# Patient Record
Sex: Male | Born: 1950 | Race: White | Hispanic: No | Smoking: Never smoker
Health system: Southern US, Community
[De-identification: ages and names within clinical notes are randomized; demographics above are authoritative.]

## PROBLEM LIST (undated history)

## (undated) DIAGNOSIS — K219 Gastro-esophageal reflux disease without esophagitis: Secondary | ICD-10-CM

## (undated) DIAGNOSIS — G4730 Sleep apnea, unspecified: Secondary | ICD-10-CM

## (undated) DIAGNOSIS — I509 Heart failure, unspecified: Secondary | ICD-10-CM

## (undated) DIAGNOSIS — T8859XA Other complications of anesthesia, initial encounter: Secondary | ICD-10-CM

## (undated) DIAGNOSIS — C801 Malignant (primary) neoplasm, unspecified: Secondary | ICD-10-CM

## (undated) DIAGNOSIS — Z950 Presence of cardiac pacemaker: Secondary | ICD-10-CM

## (undated) DIAGNOSIS — T4145XA Adverse effect of unspecified anesthetic, initial encounter: Secondary | ICD-10-CM

## (undated) DIAGNOSIS — E039 Hypothyroidism, unspecified: Secondary | ICD-10-CM

## (undated) DIAGNOSIS — I442 Atrioventricular block, complete: Secondary | ICD-10-CM

## (undated) DIAGNOSIS — I350 Nonrheumatic aortic (valve) stenosis: Secondary | ICD-10-CM

## (undated) DIAGNOSIS — I10 Essential (primary) hypertension: Secondary | ICD-10-CM

## (undated) HISTORY — DX: Nonrheumatic aortic (valve) stenosis: I35.0

## (undated) HISTORY — DX: Atrioventricular block, complete: I44.2

## (undated) HISTORY — PX: CARDIAC CATHETERIZATION: SHX172

## (undated) HISTORY — PX: OTHER SURGICAL HISTORY: SHX169

---

## 2000-05-01 ENCOUNTER — Encounter: Admission: RE | Admit: 2000-05-01 | Discharge: 2000-05-01 | Payer: Self-pay | Admitting: Family Medicine

## 2000-05-01 ENCOUNTER — Encounter: Payer: Self-pay | Admitting: Family Medicine

## 2004-06-05 ENCOUNTER — Encounter: Admission: RE | Admit: 2004-06-05 | Discharge: 2004-06-05 | Payer: Self-pay | Admitting: Geriatric Medicine

## 2005-01-05 ENCOUNTER — Encounter: Admission: RE | Admit: 2005-01-05 | Discharge: 2005-01-05 | Payer: Self-pay | Admitting: Orthopedic Surgery

## 2005-01-22 ENCOUNTER — Encounter: Admission: RE | Admit: 2005-01-22 | Discharge: 2005-01-22 | Payer: Self-pay | Admitting: Orthopedic Surgery

## 2005-05-11 ENCOUNTER — Encounter: Admission: RE | Admit: 2005-05-11 | Discharge: 2005-05-11 | Payer: Self-pay | Admitting: Orthopedic Surgery

## 2010-05-21 ENCOUNTER — Encounter: Payer: Self-pay | Admitting: Orthopedic Surgery

## 2010-12-15 ENCOUNTER — Emergency Department (HOSPITAL_COMMUNITY): Payer: Self-pay

## 2010-12-15 ENCOUNTER — Emergency Department (HOSPITAL_COMMUNITY)
Admission: EM | Admit: 2010-12-15 | Discharge: 2010-12-15 | Disposition: A | Discharge status: Home / Self Care | Payer: Self-pay | Attending: Emergency Medicine | Admitting: Emergency Medicine

## 2010-12-15 DIAGNOSIS — Z79899 Other long term (current) drug therapy: Secondary | ICD-10-CM | POA: Insufficient documentation

## 2010-12-15 DIAGNOSIS — R262 Difficulty in walking, not elsewhere classified: Secondary | ICD-10-CM | POA: Insufficient documentation

## 2010-12-15 DIAGNOSIS — H539 Unspecified visual disturbance: Secondary | ICD-10-CM | POA: Insufficient documentation

## 2010-12-15 DIAGNOSIS — W108XXA Fall (on) (from) other stairs and steps, initial encounter: Secondary | ICD-10-CM | POA: Insufficient documentation

## 2010-12-15 DIAGNOSIS — R404 Transient alteration of awareness: Secondary | ICD-10-CM | POA: Insufficient documentation

## 2010-12-15 DIAGNOSIS — E789 Disorder of lipoprotein metabolism, unspecified: Secondary | ICD-10-CM | POA: Insufficient documentation

## 2010-12-15 DIAGNOSIS — I10 Essential (primary) hypertension: Secondary | ICD-10-CM | POA: Insufficient documentation

## 2010-12-15 DIAGNOSIS — S0990XA Unspecified injury of head, initial encounter: Secondary | ICD-10-CM | POA: Insufficient documentation

## 2010-12-15 DIAGNOSIS — E119 Type 2 diabetes mellitus without complications: Secondary | ICD-10-CM | POA: Insufficient documentation

## 2010-12-15 DIAGNOSIS — IMO0002 Reserved for concepts with insufficient information to code with codable children: Secondary | ICD-10-CM | POA: Insufficient documentation

## 2010-12-15 DIAGNOSIS — R112 Nausea with vomiting, unspecified: Secondary | ICD-10-CM | POA: Insufficient documentation

## 2010-12-15 DIAGNOSIS — M25519 Pain in unspecified shoulder: Secondary | ICD-10-CM | POA: Insufficient documentation

## 2010-12-15 DIAGNOSIS — F29 Unspecified psychosis not due to a substance or known physiological condition: Secondary | ICD-10-CM | POA: Insufficient documentation

## 2010-12-15 DIAGNOSIS — E039 Hypothyroidism, unspecified: Secondary | ICD-10-CM | POA: Insufficient documentation

## 2010-12-15 DIAGNOSIS — Z7982 Long term (current) use of aspirin: Secondary | ICD-10-CM | POA: Insufficient documentation

## 2010-12-15 DIAGNOSIS — R51 Headache: Secondary | ICD-10-CM | POA: Insufficient documentation

## 2011-07-13 ENCOUNTER — Encounter (HOSPITAL_COMMUNITY): Payer: Self-pay | Admitting: Pharmacy Technician

## 2011-07-16 DIAGNOSIS — M5126 Other intervertebral disc displacement, lumbar region: Secondary | ICD-10-CM | POA: Diagnosis present

## 2011-07-16 DIAGNOSIS — M48061 Spinal stenosis, lumbar region without neurogenic claudication: Secondary | ICD-10-CM | POA: Diagnosis present

## 2011-07-16 NOTE — H&P (Signed)
Tony [name] 07/16/2011 11:32 AM Location: [facility] Patient #: [id number] WC DOB: 1951/02/08 Married / Language: [place] / Race: White Male   History of Present Illness(Sharon Gillian Shields; 07/16/2011 12:02 PM) The patient is a 61 year old male who comes in today for a preoperative History and Physical. The patient is scheduled for a L3-S1 left sided decompression/foramenotomy to be performed by Dr. Debria Garret D. Shon Baton, MD at [facility] on Thursday, July 19, 2011 at 730am .    Problem List/Past Medical(Deanna Boehlke Dierdre Highman, PA-C; 07/16/2011 11:45 AM) Pain, Lumbar (LBP) (724.2)   Allergies(Netanel Yannuzzi J [facility], PA-C; 07/16/2011 11:45 AM) No Known Drug Allergies. 07/03/2011   Family History(Zahava Quant J Chantel Teti, PA-C; 07/16/2011 11:45 AM) Cerebrovascular Accident. father Depression. mother Diabetes Mellitus. mother and father Cancer. mother Heart Disease. father Heart disease in male family member before age 62 Hypertension. father   Social History(Cuong Moorman J [facility], PA-C; 07/16/2011 11:45 AM) Drug/Alcohol Rehab (Currently). no Drug/Alcohol Rehab (Previously). no Exercise. Exercises weekly; does other Current work status. working full time Alcohol use. former drinker Children. 2 Illicit drug use. no Pain Contract. no Living situation. live with spouse Marital status. married Number of flights of stairs before winded. 4-5 Tobacco use. never smoker   Medication History(Sharon Gillian Shields; 07/16/2011 12:11 PM) Quinapril HCl (1 Oral daily) Specific dose unknown - Active. AmLODIPine Besylate (1 Oral daily) Specific dose unknown - Active. MetFORMIN HCl Active. MetFORMIN HCl (1 Oral daily) Specific dose unknown - Active. GlipiZIDE ER (1 Oral daily) Specific dose unknown - Active. Flexeril (1 Oral prn) Specific dose unknown - Active. Lyrica (1 Oral BID) Specific dose unknown - Active. Percocet (1 Oral q 8 hours) Specific dose unknown - Active. Synthroid  (1 Oral daily) Specific dose unknown - Active. Sertraline HCl (1 Oral daily) Specific dose unknown - Active. Zantac 150 Maximum Strength (1 Oral prn heartburn) Specific dose unknown - Active. Aspir-81 (81MG  Tablet DR, 1 Oral daily) Active.   Past Surgical History(Jakaila Norment J [facility], PA-C; 07/16/2011 11:45 AM) Thyroidectomy; Subtotal. left, right Thyroidectomy; Total   Other Problems(Nicha Hemann J [facility], PA-C; 07/16/2011 11:45 AM) Hypercholesterolemia Unspecified Diagnosis High blood pressure Depression Diabetes Mellitus, Type II Gastroesophageal Reflux Disease Cancer   Review of Systems(Travian Kerner J [facility], PA-C; 07/16/2011 11:47 AM) General:Present- Night Sweats and Weight Gain. Not Present- Chills, Fever, Appetite Loss, Fatigue, Feeling sick and Weight Loss. Skin:Not Present- Itching, Rash, Skin Color Changes, Ulcer, Psoriasis and Change in Hair or Nails. HEENT:Not Present- Sensitivity to light, Hearing problems, Nose Bleed and Ringing in the Ears. Neck:Not Present- Swollen Glands and Neck Mass. Respiratory:Present- Snoring. Not Present- Chronic Cough, Bloody sputum and Dyspnea. Gastrointestinal:Present- Heartburn. Not Present- Bloody Stool, Abdominal Pain, Vomiting, Nausea and Incontinence of Stool. Male Genitourinary:Present- Frequency. Not Present- Blood in Urine, Incontinence and Nocturia. Musculoskeletal:Present- Muscle Weakness, Muscle Pain and Back Pain. Not Present- Joint Stiffness, Joint Swelling and Joint Pain. Neurological:Present- Burning. Not Present- Tingling, Numbness, Tremor, Headaches and Dizziness. Psychiatric:Present- Depression. Not Present- Anxiety and Memory Loss. Endocrine:Not Present- Cold Intolerance, Heat Intolerance, Excessive hunger and Excessive Thirst. Hematology:Not Present- Abnormal Bleeding, Anemia, Blood Clots and Easy Bruising.   Vitals(Sharon Gillian Shields; 07/16/2011 11:34 AM) 07/16/2011 11:33 AM Weight: 247.3 lb Height: 72 in Body  Surface Area: 2.39 m Body Mass Index: 33.54 kg/m Pulse: 90 (Regular) BP: 139/73 (Sitting, Left Arm, Standard)    Physical Exam(Jaleena Viviani J Marian Grandt, PA-C; 07/16/2011 12:18 PM) The physical exam findings are as follows:   General General Appearance- pleasant. Not in acute distress. Orientation- Oriented X3. Build & Nutrition-  Well nourished and Well developed. Posture- Normal posture. Gait- Normal. Mental Status- Alert.   Integumentary Lumbar Spine- Skin examination of the lumbar spine is without deformity, skin lesions, lacerations or abrasions.   Head and Neck Neck Global Assessment- supple. no lymphadenopathy and no nucchal rigidty.   Eye Pupil- Bilateral- Normal, Direct reaction to light normal, Equal and Regular. Motion- Bilateral- EOMI.   Chest and Lung Exam Auscultation: Breath sounds:- Clear.   Cardiovascular Auscultation:Rhythm- Regular rate and rhythm. Heart Sounds- Normal heart sounds.   Abdomen Palpation/Percussion:Palpation and Percussion of the abdomen reveal - Non Tender, No Rebound tenderness and Soft.   Peripheral Vascular Lower Extremity:Inspection- Bilateral- Inspection Normal. Palpation:Posterior tibial pulse- Bilateral- 2+. Dorsalis pedis pulse- Bilateral- 2+.   Neurologic Sensation:Lower Extremity- Bilateral- sensation is intact in the lower extremity. Reflexes:Patellar Reflex- Bilateral- 1+. Achilles Reflex- Bilateral- 1+. Babinski- Bilateral- Babinski not present. Clonus- Bilateral- clonus not present. Hoffman's Sign- Bilateral- Hoffman's sign not present. Testing:Seated Straight Leg Raise- Bilateral- Seated straight leg raise negative.   Musculoskeletal Spine/Ribs/Pelvis Lumbosacral Spine:Inspection and Palpation- Tenderness- generalized. bony and soft tissue palpation of the lumbar spine and SI joint does not recreate their typical pain. Strength and Tone: Strength:Hip  Flexion- Bilateral- 5/5. Knee Extension- Bilateral- 5/5. Knee Flexion- Bilateral- 5/5. Ankle Dorsiflexion- Bilateral- 5/5. Ankle Plantarflexion- Bilateral- 5/5. Heel walk- Bilateral- able to heel walk without difficulty. Toe Walk- Bilateral- able to walk on toes without difficulty. Heel-Toe Walk- Bilateral- able to heel-toe walk without difficulty. ROM- Flexion- mildly decreased range of motion. Extension- mildly decreased range of motion and painful. Pain:- extension is more painful than flexion. Waddell's Signs- no Waddell's signs present. Lower Extremity Range of Motion:- No true hip, knee or ankle pain with range of motion. Gait and Station:Assistive Devices- no assistive devices.   Assessment & Plan(Naren Benally J [facility], PA-C; 07/16/2011 12:30 PM) Note: Unfortunately conservative measures consisting of observation, activity modification, physical therapy, oral pain medications and injection therapy have failed to alleviate his symptoms and given the ongoing nature of his pain and the decrease in his quality of life, he wishes to proceed with surgery. Risks/benefits/alternatives to surgery/expectations following the procedure have been reviewed with the patient by Dr. Shon Baton.  MRI of the lumbar spine dated 07/06/11 demonstrates L3-4 small left paramidline disc herniation extends into the left foramen resulting in moderate stenosis, potentially affecting the exiting left L3 nerve root. L4-5 advanced anterior and moderate dorsolateral spondylosis. L5-S1 left sided predominate osseous overgrowth and broad disc bulge resulting in severe left foraminal stenosis potentially affecting the exiting left L5 nerve root  He has been medically cleared by Dr. Pete Glatter with [facility]. Please see the scanned document in he patient office chart. He has not yet been fitted for a corset brace and I have informed him that we can fit him for this at the hospital. He is scheduled to  complete his pre-op hospital requirements on Wednesday, July 18, 2011 at [facility]. He is aware of the importance of this appointment and he knows to bring his medication list complete with dosages to this appointment.   All of his questions have been encouraged, addressed and answered. Plan, at this time is to proceed with surgery as scheduled.   Signed electronically by Gwinda Maine, PA-C (07/16/2011 12:31 PM)  Tony [name] 07/09/2011 3:08 PM Location: [place] LOCATION Patient #: [id number] WC DOB: 1950/06/05 Married / Language: [place] / Race: White Male   History of Present Illness(Sharon Gillian Shields; 07/09/2011 3:13 PM) The patient is a 61 year old  male who presents today for follow up of their back. The patient is being followed for their left-sided back pain. They are now 2 week(s) out from injury. The following medication has been used for pain control: Percocet. The patient reports their current pain level to be moderate to severe. The patient presents today following MRI.    Subjective Transcription(DAHARI Sheela Stack, MD; 07/12/2011 3:48 PM)  Tony [name] returns today for a followup. Since I last saw him he has developed horrific leg pain and moderate low back pain. He continues to have numbness and dysesthesias. These seem to have progressed since his visit last week. He is now limping with an increased leg pain.    Allergies(Sharon Gillian Shields; 07/09/2011 3:13 PM) No Known Drug Allergies. 07/03/2011   Social History(Sharon Gillian Shields; 07/09/2011 3:14 PM) Tobacco use. never smoker   Medication History(Sharon Gillian Shields; 07/09/2011 3:14 PM) Percocet (5-325MG  Tablet, 1 (one) Oral every 8 hours as needed for pain, Taken starting 07/06/2011) Active. Lyrica (75MG  Capsule, 1 (one) Oral two times daily, Taken starting 07/03/2011) Active. Flexeril ( Oral) Specific dose unknown - Active. PredniSONE ( Oral) Specific dose unknown - Active. Norco ( Oral) Specific  dose unknown - Active. MetFORMIN HCl (1000MG  (MOD) Tablet ER 24HR, Oral) Active. GlipiZIDE ( Oral) Specific dose unknown - Active. Quinapril-Hydrochlorothiazide ( Oral) Specific dose unknown - Active. Crestor ( Oral) Specific dose unknown - Active. Aspirin EC ( Oral) Specific dose unknown - Active. Synthroid ( Oral) Specific dose unknown - Active. Sertraline HCl ( Oral) Specific dose unknown - Active.   Objective Transcription(DAHARI Sheela Stack, MD; 07/12/2011 3:48 PM)  His overall clinical exam is unchanged. There is no loss of bowel and bladder control. He did have his MRI dated 07-06-11. That MRI demonstrates multi-level degenerative disk changes. At L2-3 there is a small right disk protrusion that compresses the right lateral recess causing some mild canal stenosis but the foramen are patent. At L3-4 there is degenerative disk disease. There is small left disk herniation that extends to the left neural foramen. This is affecting the existing L3 nerve root and the slightly traversing L4 nerve root. 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 There is a rightward bulge resulting in moderate central stenosis and moderate bilateral foraminal stenosis. At L5-S1 there is advanced left degenerative disk disease with severe left foraminal stenosis and moderate right foraminal stenosis.    RADIOGRAPHS:  I have reviewed the MRI and the results with the patient. He has not had any relief with injection therapy. His overall function is deteriorating. He would like to proceed with surgery. I do think that the L3-4 disk is the probable acute pain generator which resulted from the work related injury.        Plans Transcription(DAHARI D BROOKS, MD; 07/12/2011 3:48 PM)  At this point, to address the problem he would need a left sided L3-4 diskectomy decompression and I would also evaluate the  L4-5 level as well as the L5-S1 foraminal stenosis. I hesitate to  recommend an extensive decompression due to the underlying scoliosis. I do think if he were to have an extensive decompression then the risk of the deformity progression would increase. I did tell him the rule of this surgery is to reduce his leg pain and not necessarily to address his back pain.    In the future he may require surgical intervention due to the underlying deformity and disk degeneration. In discussing the treatment with the patient and his  wife they agree that the leg pain is the predominant source of discomfort at this time. Therefore, I will set him up for a left L3-4, L4-5 and L5-S1 decompression foraminotomy to address these issues and also the diskectomy at L3-4. I have discussed the risks with him to include infection, bleeding, nerve damage, death, stroke, paralysis, failure to heal, need for further surgery, ongoing or worse pain, nerve damage, loss of bowel and bladder control. All of the questions have been addressed.        Miscellaneous Transcription(DAHARI Sheela Stack, MD; 07/12/2011 3:48 PM)  Debria Garret D. Shon Baton, MD/jgc    T: 07-12-11  D: 07-10-11    Signed electronically by Alvy Beal, MD (07/10/2011 2:14 PM)

## 2011-07-17 NOTE — Pre-Procedure Instructions (Signed)
[address] TAMAS SUEN  07/17/2011   Your procedure is scheduled on:  Thursday July 19, 2011 at 0730 am  Report to [facility] at 0530 AM.  Call this number if you have problems the morning of surgery: [phone]   Remember:   Do not eat food:After Midnight.  May have clear liquids: up to 4 Hours before arrival until 0130 am .  Clear liquids include soda, tea, black coffee, apple or grape juice, broth.  Take these medicines the morning of surgery with A SIP OF WATER: Amlodipine, Hydrocodone/ Oxycodone (if needed for pain), Levothyroxine, Omeprazole, Pregabalin, and Sertaline.    Do not wear jewelry, make-up or nail polish.  Do not wear lotions, powders, or perfumes. You may wear deodorant.  Do not shave 48 hours prior to surgery.  Do not bring valuables to the hospital.  Contacts, dentures or bridgework may not be worn into surgery.  Leave suitcase in the car. After surgery it may be brought to your room.  For patients admitted to the hospital, checkout time is 11:00 AM the day of discharge.   Patients discharged the day of surgery will not be allowed to drive home.  Name and phone number of your driver:   Special Instructions: CHG Shower Use Special Wash: 1/2 bottle night before surgery and 1/2 bottle morning of surgery.   Please read over the following fact sheets that you were given: Pain Booklet, Coughing and Deep Breathing and Surgical Site Infection Prevention

## 2011-07-18 ENCOUNTER — Encounter (HOSPITAL_COMMUNITY)
Admission: RE | Admit: 2011-07-18 | Discharge: 2011-07-18 | Disposition: A | Discharge status: Home / Self Care | Payer: Worker's Compensation | Source: Ambulatory Visit | Attending: Physician Assistant | Admitting: Physician Assistant

## 2011-07-18 ENCOUNTER — Encounter (HOSPITAL_COMMUNITY): Payer: Self-pay

## 2011-07-18 ENCOUNTER — Encounter (HOSPITAL_COMMUNITY)
Admission: RE | Admit: 2011-07-18 | Discharge: 2011-07-18 | Disposition: A | Discharge status: Home / Self Care | Payer: Worker's Compensation | Source: Ambulatory Visit | Attending: Orthopedic Surgery | Admitting: Orthopedic Surgery

## 2011-07-18 ENCOUNTER — Other Ambulatory Visit: Payer: Self-pay

## 2011-07-18 HISTORY — DX: Malignant (primary) neoplasm, unspecified: C80.1

## 2011-07-18 HISTORY — DX: Adverse effect of unspecified anesthetic, initial encounter: T41.45XA

## 2011-07-18 HISTORY — DX: Gastro-esophageal reflux disease without esophagitis: K21.9

## 2011-07-18 HISTORY — DX: Hypothyroidism, unspecified: E03.9

## 2011-07-18 HISTORY — DX: Other complications of anesthesia, initial encounter: T88.59XA

## 2011-07-18 HISTORY — DX: Essential (primary) hypertension: I10

## 2011-07-18 LAB — COMPREHENSIVE METABOLIC PANEL
ALT: 27 U/L (ref 0–53)
AST: 20 U/L (ref 0–37)
Albumin: 4.1 g/dL (ref 3.5–5.2)
CO2: 30 mEq/L (ref 19–32)
Calcium: 10 mg/dL (ref 8.4–10.5)
Chloride: 99 mEq/L (ref 96–112)
Creatinine, Ser: 0.99 mg/dL (ref 0.50–1.35)
GFR calc non Af Amer: 86 mL/min — ABNORMAL LOW (ref >90)
Sodium: 140 mEq/L (ref 135–145)

## 2011-07-18 LAB — CBC
MCH: 29.5 pg (ref 26.0–34.0)
MCV: 84.8 fL (ref 78.0–100.0)
Platelets: 229 10*3/uL (ref 150–400)
RBC: 5.05 MIL/uL (ref 4.22–5.81)
RDW: 13.3 % (ref 11.5–15.5)
WBC: 10 10*3/uL (ref 4.0–10.5)

## 2011-07-18 LAB — DIFFERENTIAL
Eosinophils Relative: 2 % (ref 0–5)
Lymphocytes Relative: 30 % (ref 12–46)
Monocytes Absolute: 0.9 10*3/uL (ref 0.1–1.0)
Monocytes Relative: 9 % (ref 3–12)
Neutro Abs: 6 10*3/uL (ref 1.7–7.7)

## 2011-07-18 LAB — SURGICAL PCR SCREEN
MRSA, PCR: NEGATIVE
Staphylococcus aureus: NEGATIVE

## 2011-07-18 MED ORDER — CEFAZOLIN SODIUM-DEXTROSE 2-3 GM-% IV SOLR
2.0000 g | INTRAVENOUS | Status: AC
  Administered 2011-07-19: 2 g via INTRAVENOUS
  Filled 2011-07-18: qty 50

## 2011-07-18 NOTE — Consult Note (Signed)
Anesthesia:  Patient is a 61 year old male scheduled for L3-4 left diskectomy, L3-S1 decompression and foraminotomy on 07/19/11.  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  He also reports he was slow to wake up after his thyroidectomy in 1985.  His PCP is Dr. Pete Glatter.    Labs and CXR acceptable.  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  I think it appears stable since at least 07/07/08 (lateral T wave abnormality actually appears improved since then).  (There is documentation of an incomplete right BBB and inferior Q waves dating back to 05/15/01 and had a normal stress Cardiolite at that time, EF 60%).  An echo done on 07/28/08 at [facility] showed: Normal LV size and function, EF 55-60%, mild asymmetric septal hypertrophy, mild LA enlargement, mild MR/TR, AV sclerosis without stenosis, mild pulmonary insufficiency, findings suggestive of grade 1 diastolic dysfunction without elevated LA pressure.  I think his EKG appears stable.  No CV symptoms were reported at PAT or during his recent PCP visit.  Anticipate he can proceed if remains asymptomatic.

## 2011-07-19 ENCOUNTER — Encounter (HOSPITAL_COMMUNITY): Payer: Self-pay | Admitting: *Deleted

## 2011-07-19 ENCOUNTER — Encounter (HOSPITAL_COMMUNITY): Payer: Self-pay | Admitting: Vascular Surgery

## 2011-07-19 ENCOUNTER — Inpatient Hospital Stay (HOSPITAL_COMMUNITY)
Admission: RE | Admit: 2011-07-19 | Discharge: 2011-07-23 | DRG: 491 | Disposition: A | Discharge status: Home Health | Payer: Worker's Compensation | Source: Ambulatory Visit | Attending: Orthopedic Surgery | Admitting: Orthopedic Surgery

## 2011-07-19 ENCOUNTER — Ambulatory Visit (HOSPITAL_COMMUNITY): Payer: Worker's Compensation | Admitting: Vascular Surgery

## 2011-07-19 ENCOUNTER — Encounter (HOSPITAL_COMMUNITY)
Admission: RE | Disposition: A | Discharge status: Home Health | Payer: Self-pay | Source: Ambulatory Visit | Attending: Orthopedic Surgery

## 2011-07-19 ENCOUNTER — Ambulatory Visit (HOSPITAL_COMMUNITY): Payer: Worker's Compensation

## 2011-07-19 DIAGNOSIS — M4120 Other idiopathic scoliosis, site unspecified: Secondary | ICD-10-CM | POA: Diagnosis present

## 2011-07-19 DIAGNOSIS — I10 Essential (primary) hypertension: Secondary | ICD-10-CM | POA: Diagnosis present

## 2011-07-19 DIAGNOSIS — R339 Retention of urine, unspecified: Secondary | ICD-10-CM | POA: Diagnosis not present

## 2011-07-19 DIAGNOSIS — E890 Postprocedural hypothyroidism: Secondary | ICD-10-CM | POA: Diagnosis present

## 2011-07-19 DIAGNOSIS — Z01812 Encounter for preprocedural laboratory examination: Secondary | ICD-10-CM

## 2011-07-19 DIAGNOSIS — K219 Gastro-esophageal reflux disease without esophagitis: Secondary | ICD-10-CM | POA: Diagnosis present

## 2011-07-19 DIAGNOSIS — M48061 Spinal stenosis, lumbar region without neurogenic claudication: Secondary | ICD-10-CM

## 2011-07-19 DIAGNOSIS — E119 Type 2 diabetes mellitus without complications: Secondary | ICD-10-CM | POA: Diagnosis present

## 2011-07-19 DIAGNOSIS — M5126 Other intervertebral disc displacement, lumbar region: Principal | ICD-10-CM | POA: Diagnosis present

## 2011-07-19 DIAGNOSIS — Z85850 Personal history of malignant neoplasm of thyroid: Secondary | ICD-10-CM

## 2011-07-19 HISTORY — PX: LUMBAR LAMINECTOMY/DECOMPRESSION MICRODISCECTOMY: SHX5026

## 2011-07-19 LAB — GLUCOSE, CAPILLARY
Glucose-Capillary: 148 mg/dL — ABNORMAL HIGH (ref 70–99)
Glucose-Capillary: 169 mg/dL — ABNORMAL HIGH (ref 70–99)

## 2011-07-19 SURGERY — LUMBAR LAMINECTOMY/DECOMPRESSION MICRODISCECTOMY 3 LEVELS
Anesthesia: General | Site: Back | Laterality: Left | Wound class: Clean

## 2011-07-19 MED ORDER — LACTATED RINGERS IV SOLN: INTRAVENOUS | Status: DC

## 2011-07-19 MED ORDER — WHITE PETROLATUM GEL
Status: AC
  Administered 2011-07-19: 15:00:00
  Filled 2011-07-19: qty 5

## 2011-07-19 MED ORDER — HYDROMORPHONE HCL PF 1 MG/ML IJ SOLN: 0.2500 mg | INTRAMUSCULAR | Status: DC | PRN

## 2011-07-19 MED ORDER — HEMOSTATIC AGENTS (NO CHARGE) OPTIME
TOPICAL | Status: DC | PRN
  Administered 2011-07-19: 1 via TOPICAL

## 2011-07-19 MED ORDER — THROMBIN 20000 UNITS EX KIT
PACK | CUTANEOUS | Status: DC | PRN
  Administered 2011-07-19: 09:00:00 via TOPICAL

## 2011-07-19 MED ORDER — SODIUM CHLORIDE 0.9 % IJ SOLN: 3.0000 mL | INTRAMUSCULAR | Status: DC | PRN

## 2011-07-19 MED ORDER — LISINOPRIL 20 MG PO TABS
20.0000 mg | ORAL_TABLET | Freq: Every day | ORAL | Status: DC
  Administered 2011-07-20 – 2011-07-23 (×4): 20 mg via ORAL
  Filled 2011-07-19 (×4): qty 1

## 2011-07-19 MED ORDER — CEFAZOLIN SODIUM 1-5 GM-% IV SOLN
1.0000 g | Freq: Three times a day (TID) | INTRAVENOUS | Status: AC
  Administered 2011-07-19 (×2): 1 g via INTRAVENOUS
  Filled 2011-07-19 (×2): qty 50

## 2011-07-19 MED ORDER — HETASTARCH-ELECTROLYTES 6 % IV SOLN
INTRAVENOUS | Status: DC | PRN
  Administered 2011-07-19: 11:00:00 via INTRAVENOUS

## 2011-07-19 MED ORDER — ONDANSETRON HCL 4 MG/2ML IJ SOLN
4.0000 mg | INTRAMUSCULAR | Status: DC | PRN
  Filled 2011-07-19: qty 2

## 2011-07-19 MED ORDER — METHOCARBAMOL 500 MG PO TABS
500.0000 mg | ORAL_TABLET | Freq: Four times a day (QID) | ORAL | Status: DC | PRN
  Administered 2011-07-19 – 2011-07-23 (×13): 500 mg via ORAL
  Filled 2011-07-19 (×12): qty 1

## 2011-07-19 MED ORDER — LEVOTHYROXINE SODIUM 25 MCG PO TABS
225.0000 ug | ORAL_TABLET | Freq: Every day | ORAL | Status: DC
  Administered 2011-07-20 – 2011-07-23 (×4): 225 ug via ORAL
  Filled 2011-07-19 (×5): qty 1

## 2011-07-19 MED ORDER — ZOLPIDEM TARTRATE 10 MG PO TABS
10.0000 mg | ORAL_TABLET | Freq: Every evening | ORAL | Status: DC | PRN
  Administered 2011-07-19 – 2011-07-20 (×2): 10 mg via ORAL
  Filled 2011-07-19 (×2): qty 1

## 2011-07-19 MED ORDER — MORPHINE SULFATE 4 MG/ML IJ SOLN
1.0000 mg | INTRAMUSCULAR | Status: DC | PRN
  Administered 2011-07-20: 2 mg via INTRAVENOUS
  Administered 2011-07-20: 4 mg via INTRAVENOUS
  Filled 2011-07-19 (×2): qty 1

## 2011-07-19 MED ORDER — PHENYLEPHRINE HCL 10 MG/ML IJ SOLN
INTRAMUSCULAR | Status: DC | PRN
  Administered 2011-07-19 (×2): 80 ug via INTRAVENOUS

## 2011-07-19 MED ORDER — SODIUM CHLORIDE 0.9 % IV SOLN: 250.0000 mL | INTRAVENOUS | Status: DC

## 2011-07-19 MED ORDER — OXYCODONE HCL 5 MG PO TABS
10.0000 mg | ORAL_TABLET | ORAL | Status: DC | PRN
  Administered 2011-07-19 – 2011-07-23 (×17): 10 mg via ORAL
  Filled 2011-07-19 (×16): qty 2

## 2011-07-19 MED ORDER — MORPHINE SULFATE 2 MG/ML IJ SOLN: 0.0500 mg/kg | INTRAMUSCULAR | Status: DC | PRN

## 2011-07-19 MED ORDER — PANTOPRAZOLE SODIUM 40 MG PO TBEC
40.0000 mg | DELAYED_RELEASE_TABLET | Freq: Every day | ORAL | Status: DC
  Administered 2011-07-19 – 2011-07-22 (×4): 40 mg via ORAL
  Filled 2011-07-19 (×4): qty 1

## 2011-07-19 MED ORDER — DOCUSATE SODIUM 100 MG PO CAPS
100.0000 mg | ORAL_CAPSULE | Freq: Two times a day (BID) | ORAL | Status: DC
  Administered 2011-07-19 – 2011-07-23 (×7): 100 mg via ORAL
  Filled 2011-07-19 (×10): qty 1

## 2011-07-19 MED ORDER — MIDAZOLAM HCL 5 MG/5ML IJ SOLN
INTRAMUSCULAR | Status: DC | PRN
  Administered 2011-07-19: 2 mg via INTRAVENOUS

## 2011-07-19 MED ORDER — AMLODIPINE BESYLATE 10 MG PO TABS
10.0000 mg | ORAL_TABLET | Freq: Every day | ORAL | Status: DC
  Administered 2011-07-20 – 2011-07-23 (×4): 10 mg via ORAL
  Filled 2011-07-19 (×4): qty 1

## 2011-07-19 MED ORDER — BUPIVACAINE-EPINEPHRINE 0.25% -1:200000 IJ SOLN
INTRAMUSCULAR | Status: DC | PRN
  Administered 2011-07-19: 10 mL

## 2011-07-19 MED ORDER — METHOCARBAMOL 100 MG/ML IJ SOLN
500.0000 mg | Freq: Four times a day (QID) | INTRAVENOUS | Status: DC | PRN
  Filled 2011-07-19: qty 5

## 2011-07-19 MED ORDER — MENTHOL 3 MG MT LOZG: 1.0000 | LOZENGE | OROMUCOSAL | Status: DC | PRN

## 2011-07-19 MED ORDER — HYDROCHLOROTHIAZIDE 12.5 MG PO CAPS
12.5000 mg | ORAL_CAPSULE | Freq: Every day | ORAL | Status: DC
  Administered 2011-07-20 – 2011-07-23 (×4): 12.5 mg via ORAL
  Filled 2011-07-19 (×4): qty 1

## 2011-07-19 MED ORDER — ACETAMINOPHEN 10 MG/ML IV SOLN
1000.0000 mg | Freq: Four times a day (QID) | INTRAVENOUS | Status: AC
  Administered 2011-07-19 – 2011-07-20 (×4): 1000 mg via INTRAVENOUS
  Filled 2011-07-19 (×4): qty 100

## 2011-07-19 MED ORDER — SERTRALINE HCL 50 MG PO TABS
50.0000 mg | ORAL_TABLET | Freq: Every day | ORAL | Status: DC
  Administered 2011-07-19 – 2011-07-23 (×5): 50 mg via ORAL
  Filled 2011-07-19 (×6): qty 1

## 2011-07-19 MED ORDER — NEOSTIGMINE METHYLSULFATE 1 MG/ML IJ SOLN
INTRAMUSCULAR | Status: DC | PRN
  Administered 2011-07-19: 3 mg via INTRAVENOUS

## 2011-07-19 MED ORDER — PHENOL 1.4 % MT LIQD: 1.0000 | OROMUCOSAL | Status: DC | PRN

## 2011-07-19 MED ORDER — PROPOFOL 10 MG/ML IV EMUL
INTRAVENOUS | Status: DC | PRN
  Administered 2011-07-19: 250 mg via INTRAVENOUS

## 2011-07-19 MED ORDER — ROCURONIUM BROMIDE 100 MG/10ML IV SOLN
INTRAVENOUS | Status: DC | PRN
  Administered 2011-07-19 (×2): 10 mg via INTRAVENOUS
  Administered 2011-07-19: 20 mg via INTRAVENOUS
  Administered 2011-07-19 (×3): 10 mg via INTRAVENOUS
  Administered 2011-07-19: 50 mg via INTRAVENOUS

## 2011-07-19 MED ORDER — LACTATED RINGERS IV SOLN
INTRAVENOUS | Status: DC | PRN
  Administered 2011-07-19 (×3): via INTRAVENOUS

## 2011-07-19 MED ORDER — SODIUM CHLORIDE 0.9 % IJ SOLN
3.0000 mL | Freq: Two times a day (BID) | INTRAMUSCULAR | Status: DC
  Administered 2011-07-20 – 2011-07-22 (×4): 3 mL via INTRAVENOUS
  Administered 2011-07-22: 10 mL via INTRAVENOUS

## 2011-07-19 MED ORDER — INSULIN ASPART 100 UNIT/ML ~~LOC~~ SOLN
0.0000 [IU] | Freq: Three times a day (TID) | SUBCUTANEOUS | Status: DC
  Administered 2011-07-20: 5 [IU] via SUBCUTANEOUS
  Administered 2011-07-20 (×2): 3 [IU] via SUBCUTANEOUS
  Administered 2011-07-21: 5 [IU] via SUBCUTANEOUS
  Administered 2011-07-21 (×2): 2 [IU] via SUBCUTANEOUS
  Administered 2011-07-22 – 2011-07-23 (×4): 3 [IU] via SUBCUTANEOUS

## 2011-07-19 MED ORDER — GLYCOPYRROLATE 0.2 MG/ML IJ SOLN
INTRAMUSCULAR | Status: DC | PRN
  Administered 2011-07-19: .6 mg via INTRAVENOUS

## 2011-07-19 MED ORDER — VANCOMYCIN HCL 1000 MG IV SOLR
1000.0000 mg | INTRAVENOUS | Status: AC
  Filled 2011-07-19: qty 1000

## 2011-07-19 MED ORDER — INSULIN ASPART 100 UNIT/ML ~~LOC~~ SOLN
0.0000 [IU] | SUBCUTANEOUS | Status: DC
  Administered 2011-07-19: 3 [IU] via SUBCUTANEOUS

## 2011-07-19 MED ORDER — METFORMIN HCL 500 MG PO TABS
500.0000 mg | ORAL_TABLET | Freq: Two times a day (BID) | ORAL | Status: DC
  Administered 2011-07-19 – 2011-07-23 (×8): 500 mg via ORAL
  Filled 2011-07-19 (×10): qty 1

## 2011-07-19 MED ORDER — INSULIN ASPART 100 UNIT/ML ~~LOC~~ SOLN
0.0000 [IU] | Freq: Every day | SUBCUTANEOUS | Status: DC
  Administered 2011-07-22: 2 [IU] via SUBCUTANEOUS

## 2011-07-19 MED ORDER — LACTATED RINGERS IV SOLN
INTRAVENOUS | Status: DC
  Administered 2011-07-19 (×2): via INTRAVENOUS

## 2011-07-19 MED ORDER — GLIPIZIDE ER 10 MG PO TB24
10.0000 mg | ORAL_TABLET | Freq: Every day | ORAL | Status: DC
  Administered 2011-07-20 – 2011-07-23 (×4): 10 mg via ORAL
  Filled 2011-07-19 (×5): qty 1

## 2011-07-19 MED ORDER — QUINAPRIL-HYDROCHLOROTHIAZIDE 20-12.5 MG PO TABS: 1.0000 | ORAL_TABLET | Freq: Every day | ORAL | Status: DC

## 2011-07-19 MED ORDER — FENTANYL CITRATE 0.05 MG/ML IJ SOLN
INTRAMUSCULAR | Status: DC | PRN
  Administered 2011-07-19: 50 ug via INTRAVENOUS
  Administered 2011-07-19: 250 ug via INTRAVENOUS

## 2011-07-19 SURGICAL SUPPLY — 60 items
BUR EGG ELITE 4.0 (BURR) IMPLANT
BUR MATCHSTICK NEURO 3.0 LAGG (BURR) IMPLANT
CANISTER SUCTION 2500CC (MISCELLANEOUS) IMPLANT
CLOTH BEACON ORANGE TIMEOUT ST (SAFETY) ×2 IMPLANT
CORDS BIPOLAR (ELECTRODE) ×2 IMPLANT
COVER SURGICAL LIGHT HANDLE (MISCELLANEOUS) ×2 IMPLANT
DERMABOND ADVANCED (GAUZE/BANDAGES/DRESSINGS)
DERMABOND ADVANCED .7 DNX12 (GAUZE/BANDAGES/DRESSINGS) IMPLANT
DRAIN CHANNEL 15F RND FF W/TCR (WOUND CARE) IMPLANT
DRAPE POUCH INSTRU U-SHP 10X18 (DRAPES) ×2 IMPLANT
DRAPE SURG 17X23 STRL (DRAPES) IMPLANT
DRAPE U-SHAPE 47X51 STRL (DRAPES) ×2 IMPLANT
DRSG MEPILEX BORDER 4X8 (GAUZE/BANDAGES/DRESSINGS) ×2 IMPLANT
DURAPREP 26ML APPLICATOR (WOUND CARE) ×2 IMPLANT
ELECT BLADE 4.0 EZ CLEAN MEGAD (MISCELLANEOUS) ×2
ELECT CAUTERY BLADE 6.4 (BLADE) ×2 IMPLANT
ELECT REM PT RETURN 9FT ADLT (ELECTROSURGICAL) ×2
ELECTRODE BLDE 4.0 EZ CLN MEGD (MISCELLANEOUS) ×1 IMPLANT
ELECTRODE REM PT RTRN 9FT ADLT (ELECTROSURGICAL) ×1 IMPLANT
EVACUATOR 1/8 PVC DRAIN (DRAIN) IMPLANT
EVACUATOR SILICONE 100CC (DRAIN) IMPLANT
GLOVE BIOGEL PI IND STRL 6.5 (GLOVE) ×1 IMPLANT
GLOVE BIOGEL PI IND STRL 7.0 (GLOVE) ×1 IMPLANT
GLOVE BIOGEL PI IND STRL 8.5 (GLOVE) ×1 IMPLANT
GLOVE BIOGEL PI INDICATOR 6.5 (GLOVE) ×1
GLOVE BIOGEL PI INDICATOR 7.0 (GLOVE) ×1
GLOVE BIOGEL PI INDICATOR 8.5 (GLOVE) ×1
GLOVE ECLIPSE 6.0 STRL STRAW (GLOVE) ×2 IMPLANT
GLOVE ECLIPSE 8.5 STRL (GLOVE) ×4 IMPLANT
GLOVE SURG SS PI 6.5 STRL IVOR (GLOVE) ×2 IMPLANT
GOWN PREVENTION PLUS XXLARGE (GOWN DISPOSABLE) ×2 IMPLANT
GOWN STRL NON-REIN LRG LVL3 (GOWN DISPOSABLE) ×2 IMPLANT
GOWN STRL REIN XL XLG (GOWN DISPOSABLE) ×2 IMPLANT
KIT BASIN OR (CUSTOM PROCEDURE TRAY) ×2 IMPLANT
KIT ROOM TURNOVER OR (KITS) ×2 IMPLANT
KIT STIMULAN RAPID CURE  10CC (Orthopedic Implant) ×1 IMPLANT
KIT STIMULAN RAPID CURE 10CC (Orthopedic Implant) ×1 IMPLANT
NEEDLE 22X1 1/2 (OR ONLY) (NEEDLE) ×2 IMPLANT
NEEDLE SPNL 18GX3.5 QUINCKE PK (NEEDLE) ×4 IMPLANT
NS IRRIG 1000ML POUR BTL (IV SOLUTION) ×2 IMPLANT
PACK LAMINECTOMY ORTHO (CUSTOM PROCEDURE TRAY) ×2 IMPLANT
PACK UNIVERSAL I (CUSTOM PROCEDURE TRAY) ×2 IMPLANT
PAD ARMBOARD 7.5X6 YLW CONV (MISCELLANEOUS) ×6 IMPLANT
PATTIES SURGICAL .5 X.5 (GAUZE/BANDAGES/DRESSINGS) ×2 IMPLANT
PATTIES SURGICAL .5 X1 (DISPOSABLE) IMPLANT
SPONGE SURGIFOAM ABS GEL 100 (HEMOSTASIS) ×2 IMPLANT
STRIP CLOSURE SKIN 1/2X4 (GAUZE/BANDAGES/DRESSINGS) ×2 IMPLANT
SURGIFLO TRUKIT (HEMOSTASIS) ×2 IMPLANT
SUT MNCRL AB 3-0 PS2 18 (SUTURE) ×2 IMPLANT
SUT VIC AB 0 CT1 27 (SUTURE)
SUT VIC AB 0 CT1 27XBRD ANBCTR (SUTURE) IMPLANT
SUT VIC AB 1 CTX 36 (SUTURE) ×2
SUT VIC AB 1 CTX36XBRD ANBCTR (SUTURE) ×2 IMPLANT
SUT VIC AB 2-0 CT1 18 (SUTURE) ×4 IMPLANT
SYR BULB IRRIGATION 50ML (SYRINGE) ×2 IMPLANT
SYR CONTROL 10ML LL (SYRINGE) ×4 IMPLANT
TOWEL OR 17X24 6PK STRL BLUE (TOWEL DISPOSABLE) ×2 IMPLANT
TOWEL OR 17X26 10 PK STRL BLUE (TOWEL DISPOSABLE) ×2 IMPLANT
WATER STERILE IRR 1000ML POUR (IV SOLUTION) ×2 IMPLANT
YANKAUER SUCT BULB TIP NO VENT (SUCTIONS) ×2 IMPLANT

## 2011-07-19 NOTE — Anesthesia Procedure Notes (Signed)
Procedure Name: Intubation Date/Time: 07/19/2011 7:47 AM Performed by: Gwenyth Allegra Pre-anesthesia Checklist: Patient identified, Timeout performed, Emergency Drugs available, Suction available and Patient being monitored Patient Re-evaluated:Patient Re-evaluated prior to inductionOxygen Delivery Method: Circle system utilized Preoxygenation: Pre-oxygenation with 100% oxygen Intubation Type: IV induction Ventilation: Mask ventilation without difficulty and Oral airway inserted - appropriate to patient size Laryngoscope Size: Mac and 4 Grade View: Grade III Tube type: Oral Tube size: 8.5 mm Number of attempts: 1 Airway Equipment and Method: Stylet Placement Confirmation: ETT inserted through vocal cords under direct vision,  positive ETCO2 and breath sounds checked- equal and bilateral Secured at: 22 cm Tube secured with: Tape Dental Injury: Teeth and Oropharynx as per pre-operative assessment

## 2011-07-19 NOTE — H&P (Signed)
No change in clinical exam H+P reviewed  

## 2011-07-19 NOTE — Anesthesia Preprocedure Evaluation (Signed)
Anesthesia Evaluation  Patient identified by MRN, date of birth, ID band Patient awake    Reviewed: Allergy & Precautions, H&P , NPO status , Patient's Chart, lab work & pertinent test results  Airway       Dental   Pulmonary          Cardiovascular hypertension,     Neuro/Psych    GI/Hepatic GERD-  ,  Endo/Other  Diabetes mellitus-Hypothyroidism   Renal/GU      Musculoskeletal   Abdominal   Peds  Hematology   Anesthesia Other Findings   Reproductive/Obstetrics                           Anesthesia Physical Anesthesia Plan  ASA: III  Anesthesia Plan: General   Post-op Pain Management:    Induction: Intravenous  Airway Management Planned: Oral ETT  Additional Equipment:   Intra-op Plan:   Post-operative Plan:   Informed Consent:   Plan Discussed with: CRNA  Anesthesia Plan Comments:         Anesthesia Quick Evaluation

## 2011-07-19 NOTE — Transfer of Care (Signed)
Immediate Anesthesia Transfer of Care Note  Patient: Tony [name]  Procedure(s) Performed: Procedure(s) (LRB): LUMBAR LAMINECTOMY/DECOMPRESSION MICRODISCECTOMY 3 LEVELS (Left)  Patient Location: PACU  Anesthesia Type: General  Level of Consciousness: sedated  Airway & Oxygen Therapy: Patient Spontanous Breathing and Patient connected to nasal cannula oxygen  Post-op Assessment: Report given to PACU RN and Post -op Vital signs reviewed and stable  Post vital signs: Reviewed and stable  Complications: No apparent anesthesia complications

## 2011-07-19 NOTE — Op Note (Signed)
Tony [name], Tony [name]               ACCOUNT NO.:  [id number]  MEDICAL RECORD NO.:  [id number]  LOCATION:  5004                         FACILITY:  [facility]  PHYSICIAN:  Alvy Beal, MD    DATE OF BIRTH:  January 27, 1951  DATE OF PROCEDURE:  07/19/2011 DATE OF DISCHARGE:                              OPERATIVE REPORT   PREOPERATIVE DIAGNOSIS:  Lumbar disk herniation, L3-4; lumbar foraminal stenosis, L4-5 and L5-S1.  POSTOPERATIVE DIAGNOSIS:  Lumbar disk herniation, L3-4; lumbar foraminal stenosis, L4-5 and L5-S1.  OPERATIVE PROCEDURE: 1. L3-4 lumbar diskectomy. 2. L4-5 decompression.  INTRAOPERATIVE FINDINGS:  The posterolateral to the left disk herniation at L3-4 that was excised, moderate to significant foraminal stenosis at L4 with direct decompression, mild to moderate foraminal and lateral recess stenosis at L5, which was also decompressed.  COMPLICATIONS:  None.  CONDITION:  Stable.  FIRST ASSISTANT:  Norval Gable, [place].  HISTORY:  This is a very pleasant otherwise healthy 61 year old gentleman who unfortunately had a work-related injury that resulted in severe left leg pain with increased in his underlying back pain. Attempts of conservative management had failed to alleviate his symptoms.  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  The patient's principal problem was the new onset radicular leg pain.  As such, we discussed treatment options and we elected to do just a simple decompression so that the greater instability would not be caused.  I discussed with the patient potential need for fusion surgery in the future if the deformity intensifies.  All of the risks, benefits, and alternatives were discussed and consent was obtained.  OPERATIVE REPORT:  The patient was brought to the operating room, placed supine on the operating table.  After successful induction of general anesthesia and endotracheal intubation,  TEDs, SCDs, and Foley were inserted.  The patient was turned prone onto the Wilson frame.  The back was prepped and draped in standard fashion.  Appropriate time-out was done to confirm patient, procedure, and all other pertinent important data.  Once this was done, we then placed 2 needles into the back and took an x-ray to confirm the levels.  Once I confirmed the L3 and L5 spinous process levels.  I then made infiltrate the skin with 0.25% Marcaine and made a midline incision so I would be at L3 to L5.  Sharp dissection was carried out down to the deep fascia.  The paraspinal muscles were stripped from the lamina of L3, L4 and L5, and the muscles were stripped out to the facet complex.  Great care was taken not to violate the facet capsule.  I then performed the same thing on the right- hand side.  Great care was taken not to violate the interspinous process ligament.  At this point, I had the posterior elements of the spine exposed.  Self-retaining retractors were placed.  I then placed a Penfield 4 underneath the L4 lamina and took an x-ray to confirm that I was at the appropriate level.  Once this was done, I then went to the L3 lamina and used it on the left side.  I then used a curette to develop a plane underneath the  lamina and performed a partial laminotomy of L3 using a 2 and 3 mm Kerrison rongeur.  I then used a curette to release the ligamentum flavum from the leading edge of the L4 lamina and resect the ligamentum of flavum.  I then continued to use a Penfield 4 to create a plane between the thecal sac and the ligamentum flavum.  I then utilized this space with a 2 mm Kerrison to resect the ligamentum flavum and bone spur in the lateral recess.  It should be noted that there was a significant number of epidural veins that were engorged.  Each one was dealt with bipolar electrocautery.  Once I was in the lateral recess, I then palpated the L4 pedicle and identified the L4  nerve root, and then identified the L3-4 disk space.  Once I had identified the disk space, I swept the thecal sac medially and I could see the disk herniation. Using a Penfield 4 and a nerve hook, I mobilized the disk herniation and then removed it with a micro-pituitary rongeur.  There was 3 significant size fragments of disk material that I excised.  Once this was done, I then used bipolar electrocautery to coagulate the remaining venous plexus of the epidural veins.  I then used a micro-nerve hook to exploit the annular defect and sweep for any other free fragments of disk.  I then used an Epstein curette to remove some osteophyte and ensure that an adequate lateral recess and central decompression with fixation of the disk material.  Once the soft disk herniation was removed, I then removed any of the loose fragments of disk material from the intervertebral space.  I then repositioned by myself at the inferior edge of the L4 lamina and using the same technique, I performed a partial laminotomy of L4.  I again went into the lateral recess and traced down to the L5 pedicle and performed an L5 laminotomy and identified the L5 nerve root.  I then went superiorly to the L4 foramen. This was the foramen that was stenotic to the greatest amount based on the preoperative MRI.  The patient was on the Wilson frame in kyphosis and so I had released the lordosis to see the extent of the compression. At this point, I felt as though I needed to perform an L4 laminectomy in order to prevent any further compromise to the neural structures.  I then completed the laminectomy on just the left side of L4 and then undercut the spinous process at L3 and L4 in order to have an adequate central decompression as well as left-sided decompression.  This allowed me much better exposure to the facet so that I could resect it and perform an adequate foraminotomy.  At this point, a dural elevator could then freely  pass along with the nerve into the L4 foramen.  I could palpate the L4 pedicle and I had adequate foraminal decompression.  At this point, I rechecked the L3 foramen, the L5 foramen, the L4, and they were all adequately decompressed.  There was no further free fragments of disk material at L3-4 level.  There was also no free fragments of disk material noted at the L4-5 level.  At this point, I irrigated the wound copiously with normal saline and then I made sure I had hemostasis using bipolar electrocautery.  It should also be noted that the L4-5 level, there was also significant amount of venous engorgement in the epidural veins.  I then placed a thrombin-soaked Gelfoam  patty over the exposed thecal sac. Because of his underlying diabetes, my concern of wound healing, I did put antibiotic beads in the wound.  This was done with vancomycin.  I then placed a deep drain and then closed the fascia with interrupted #1 Vicryl sutures, superficial with 2-0 Vicryl sutures, and 3-0 Monocryl for the skin.  Steri-Strips and dry dressing were applied.  The patient was extubated and transferred to PACU without incident.  At the end of the case, all needle and sponge counts were correct.     Alvy Beal, MD     DDB/MEDQ  D:  07/19/2011  T:  07/19/2011  Job:  [id number]

## 2011-07-19 NOTE — Brief Op Note (Signed)
07/19/2011  11:52 AM  PATIENT:  Tony [name]  61 y.o. male  PRE-OPERATIVE DIAGNOSIS:  LUMBAR STENOSIS Lumbar three-Sacral one WITH Lumbar three-Lumbar four LEFT HERNIATED DISC  POST-OPERATIVE DIAGNOSIS:  LUMBAR STENOSIS Lumbar three-Sacral one WITH Lumbar three-Lumbar four LEFT HERNIATED DISC  PROCEDURE:  Procedure(s) (LRB): LUMBAR LAMINECTOMY/DECOMPRESSION MICRODISCECTOMY 3 LEVELS (Left)  SURGEON:  Surgeon(s) and Role:    * Venita Lick, MD - Primary  PHYSICIAN ASSISTANT:   ASSISTANTS: Norval Gable   ANESTHESIA:   general  EBL:  Total I/O In: 2500 [I.V.:2000; IV Piggyback:500] Out: 840 [Urine:600; Blood:240]  BLOOD ADMINISTERED:none  DRAINS: 1   LOCAL MEDICATIONS USED:  MARCAINE     SPECIMEN:  No Specimen  DISPOSITION OF SPECIMEN:  N/A  COUNTS:  YES  TOURNIQUET:  * No tourniquets in log *  DICTATION: .Other Dictation: Dictation Number [id number]  PLAN OF CARE: Admit to inpatient   PATIENT DISPOSITION:  PACU - hemodynamically stable.

## 2011-07-19 NOTE — Progress Notes (Signed)
Pt admitted from PACU following a L3-S1 fusion. Pt repts that preop he had mid to low back pain that radiated to his L hip. Pt currently repts his pain preop is "gone". Pt repts that pain now is "more incisional" pain. Neuro check is negative. Pt has a dry mepilex to low back with a JP that is draining mod amount of bldy drainage. Pt is alert and oriented x 3. Pt instructed IS. Pt states, "I practiced that before I came in". Lungs CTA. Heart rate regular rate and rhythm. No s/sx cardiac or resp distress and no c/o such. Vital signs stable. Abdomen is soft flat nontender and nondistended. BS+x4. Pt denies nausea or vomiting or passing gas. Ice to low back per order and for comfort. Pt repts that he was able to empty his bladder "some" in PACU. Pt repts that he had a foley during the procedure that was d/ced after surgery in the PACU. Pt oriented to room and unit.

## 2011-07-19 NOTE — Preoperative (Signed)
Beta Blockers   Reason not to administer Beta Blockers:Not Applicable 

## 2011-07-19 NOTE — Anesthesia Postprocedure Evaluation (Signed)
  Anesthesia Post-op Note  Patient: Tony [name]  Procedure(s) Performed: Procedure(s) (LRB): LUMBAR LAMINECTOMY/DECOMPRESSION MICRODISCECTOMY 3 LEVELS (Left)  Patient Location: PACU  Anesthesia Type: General  Level of Consciousness: awake  Airway and Oxygen Therapy: Patient Spontanous Breathing  Post-op Pain: mild  Post-op Assessment: Post-op Vital signs reviewed  Post-op Vital Signs: stable  Complications: No apparent anesthesia complications

## 2011-07-19 NOTE — Progress Notes (Addendum)
Pt is s/p back surgery. Pt neuro check is negative. Pt MAEx4. Pt denies numbness or tingling and has full sensation of all extremities. Pt is walking with steady gait to bathroom. Pt is c/o bladder distention and discomfort. Pt has been going to the bathroom and voiding freq small amounts since admission from PACU. Attempted to bladder scan pt but pt c/o severe discomfort of the bladder area with palpation. Rept to Dr. Shon Baton and Dimitri Ped PA. As per order rectal check performed. Pt has + sensation of rectal area and good sphincter tone. As per order, indwelling foley placed to relieve pt's discomfort. Pt tolerated well. Upon insertion of foley, pt had immediate return of pale yellow urine. Pt foley drained immediately 1200 cc. Will continue to monitor.

## 2011-07-20 ENCOUNTER — Encounter (HOSPITAL_COMMUNITY): Payer: Self-pay | Admitting: Orthopedic Surgery

## 2011-07-20 LAB — GLUCOSE, CAPILLARY
Glucose-Capillary: 178 mg/dL — ABNORMAL HIGH (ref 70–99)
Glucose-Capillary: 220 mg/dL — ABNORMAL HIGH (ref 70–99)

## 2011-07-20 MED ORDER — OXYCODONE-ACETAMINOPHEN 10-325 MG PO TABS: 1.0000 | ORAL_TABLET | ORAL | Status: AC | PRN

## 2011-07-20 MED ORDER — ONDANSETRON HCL 4 MG PO TABS: 4.0000 mg | ORAL_TABLET | Freq: Three times a day (TID) | ORAL | Status: AC | PRN

## 2011-07-20 MED ORDER — TAMSULOSIN HCL 0.4 MG PO CAPS
0.4000 mg | ORAL_CAPSULE | Freq: Every morning | ORAL | Status: DC
  Administered 2011-07-21 – 2011-07-23 (×3): 0.4 mg via ORAL
  Filled 2011-07-20 (×3): qty 1

## 2011-07-20 MED ORDER — POLYETHYLENE GLYCOL 3350 17 G PO PACK: 17.0000 g | PACK | Freq: Every day | ORAL | Status: AC

## 2011-07-20 MED ORDER — TAMSULOSIN HCL 0.4 MG PO CAPS
0.4000 mg | ORAL_CAPSULE | Freq: Once | ORAL | Status: AC
  Administered 2011-07-20: 0.4 mg via ORAL
  Filled 2011-07-20: qty 1

## 2011-07-20 MED ORDER — BACITRACIN-NEOMYCIN-POLYMYXIN OINTMENT TUBE
TOPICAL_OINTMENT | Freq: Three times a day (TID) | CUTANEOUS | Status: DC
  Administered 2011-07-20 – 2011-07-23 (×7): via TOPICAL
  Filled 2011-07-20: qty 15

## 2011-07-20 MED ORDER — CYCLOBENZAPRINE HCL 10 MG PO TABS: 10.0000 mg | ORAL_TABLET | Freq: Three times a day (TID) | ORAL | Status: AC

## 2011-07-20 MED ORDER — CEFAZOLIN SODIUM 1-5 GM-% IV SOLN
1.0000 g | Freq: Three times a day (TID) | INTRAVENOUS | Status: AC
  Administered 2011-07-20 (×2): 1 g via INTRAVENOUS
  Filled 2011-07-20 (×3): qty 50

## 2011-07-20 NOTE — Progress Notes (Signed)
CARE MANAGEMENT NOTE 07/20/2011      Action/Plan:   Patient is covered by worker's comp. Adjuster is Ilda Mori @ [phone]. Nurse case manager is Salvatore Decent708 [phone] ext [phone]. called and left message, will follow   Anticipated DC Date:  07/21/2011   Anticipated DC Plan:  HOME W HOME HEALTH SERVICES      DC Planning Services  CM consult      [facility] Choice  HOME HEALTH             Status of service:  In process, will continue to follow

## 2011-07-20 NOTE — Progress Notes (Signed)
CARE MANAGEMENT NOTE 07/20/2011        Anticipated DC Date:  07/21/2011   Anticipated DC Plan:  HOME W HOME HEALTH SERVICES      DC Planning Services  CM consult      [facility] Choice  HOME HEALTH   Choice offered to / List presented to:          [facility] arranged  HH-2 PT      [facility] agency  [facility]   Status of service:  Completed, signed off   Comments:  07/20/11 1445 Vance Peper, RN BSN Case Manger Received call from [facility], they have arranged for [facility] to provide Home Health.

## 2011-07-20 NOTE — Evaluation (Addendum)
Occupational Therapy Evaluation Patient Details Name: Tony [name] MRN: [id number] DOB: Dec 22, 1950 Today's Date: 07/20/2011  Problem List:  Patient Active Problem List  Diagnoses  . Lumbar stenosis  . HNP (herniated nucleus pulposus), lumbar    Past Medical History:  Past Medical History  Diagnosis Date  . Hypertension   . Complication of anesthesia     1985 after Thyriodectomy hard time waking up  . Diabetes mellitus     taking meds  . Hypothyroidism     Had two surgeries for Cancer  . GERD (gastroesophageal reflux disease)     on meds  . Cancer     thyroid   Past Surgical History:  Past Surgical History  Procedure Date  . Thyroidectomy x2     OT Assessment/Plan/Recommendation OT Assessment Clinical Impression Statement: pt. presents s/p back surgery and with increased pain and below problem list. Pt. will benefit from skilled OT to increase functional independence with ADLs and get pt. to supervision level at D/C home. OT Recommendation/Assessment: Patient will need skilled OT in the acute care venue OT Problem List: Pain;Decreased knowledge of precautions;Decreased knowledge of use of DME or AE Barriers to Discharge: None OT Therapy Diagnosis : Acute pain OT Plan OT Frequency: Min 2X/week OT Treatment/Interventions: Self-care/ADL training;DME and/or AE instruction;Patient/family education OT Recommendation Follow Up Recommendations: No OT follow up;Supervision - Intermittent Equipment Recommended: Tub bench; 3-in-1 Individuals Consulted Consulted and Agree with Results and Recommendations: Patient OT Goals Acute Rehab OT Goals OT Goal Formulation: With patient Time For Goal Achievement: 7 days ADL Goals Pt Will Perform Tub/Shower Transfer: Tub transfer;with supervision;with DME;Transfer tub bench ADL Goal: Tub/Shower Transfer - Progress: Goal set today Additional ADL Goal #1: Pt. will recall 3 back precautions. ADL Goal: Additional Goal #1 - Progress: Goal  set today  OT Evaluation Precautions/Restrictions  Precautions Precautions: Back Precaution Booklet Issued: Yes (comment) Precaution Comments: Handout given Required Braces or Orthoses: Yes Spinal Brace: Lumbar corset Restrictions Weight Bearing Restrictions: No Prior Functioning Home Living Lives With: Spouse Receives Help From: Family Type of Home: House Home Layout: Able to live on main level with bedroom/bathroom;Two level Alternate Level Stairs-Number of Steps: NA Home Access: Stairs to enter Entrance Stairs-Rails: None Entrance Stairs-Number of Steps: 3 Bathroom Shower/Tub: Forensic scientist: Standard Home Adaptive Equipment: Crutches;Walker - rolling Prior Function Level of Independence: Independent with basic ADLs;Independent with gait;Independent with transfers Able to Take Stairs?: Yes Driving: Yes Vocation: Full time employment Vocation Requirements: Event organiser ADL ADL Eating/Feeding: Simulated;Independent Where Assessed - Eating/Feeding: Chair Grooming: Simulated;Wash/dry hands;Set up;Minimal assistance Grooming Details (indicate cue type and reason): Min verbal cues for no bending at the sink Where Assessed - Grooming: Standing at sink Upper Body Bathing: Simulated;Chest;Right arm;Left arm;Abdomen;Set up Where Assessed - Upper Body Bathing: Sitting, chair Lower Body Bathing: Simulated;Maximal assistance Where Assessed - Lower Body Bathing: Sit to stand from chair Upper Body Dressing: Performed;Set up Upper Body Dressing Details (indicate cue type and reason): donning gown Where Assessed - Upper Body Dressing: Sitting, chair Lower Body Dressing: Simulated;Maximal assistance Where Assessed - Lower Body Dressing: Sit to stand from bed Toilet Transfer: Performed;Supervision/safety Toilet Transfer Method: Ambulating Toilet Transfer Equipment: Other (comment) (standing at commode) Toileting - Clothing Manipulation: Performed;Set  up Toileting - Clothing Manipulation Details (indicate cue type and reason): With moving gown Where Assessed - Toileting Clothing Manipulation: Standing Toileting - Hygiene: Simulated;Set up Where Assessed - Toileting Hygiene: Standing Tub/Shower Transfer: Not assessed Ambulation Related to ADLs: pt. provided close supervision ~10'  with intermittent UE use of IV pole ADL Comments: pt. educated on 3/3 back precautions with ADLs and techniques for completing LB ADLs. Pt. will benefit from further education on use of AE for LB ADLs and tub transfer with use of bench    Extremity Assessment RUE Assessment RUE Assessment: Within Functional Limits LUE Assessment LUE Assessment: Within Functional Limits Mobility  Bed Mobility Bed Mobility: Yes Sit to Sidelying Right: 5: Supervision;With rail;HOB flat Sit to Sidelying Right Details (indicate cue type and reason): min verbal cues for log rolling technique Transfers Transfers: Yes Sit to Stand: 5: Supervision;With upper extremity assist;From bed Stand to Sit: 5: Supervision;To bed    End of Session OT - End of Session Equipment Utilized During Treatment: Gait belt;Back brace Activity Tolerance: Patient tolerated treatment well Patient left: in bed;with call bell in reach Nurse Communication: Mobility status for transfers General Behavior During Session: [facility] for tasks performed Cognition: [facility] for tasks performed   Kennet Mccort, OTR/L Pager [phone] 07/20/2011, 1:41 PM

## 2011-07-20 NOTE — Discharge Summary (Signed)
Patient ID: Tony [name] MRN: [id number] DOB/AGE: 1950/08/31 61 y.o.  Admit date: 07/19/2011 Discharge date: 07/23/2011  Admission Diagnoses:  Active Problems: 1. Lumbar stenosis L3-S1 2. Left L3-4 HNP (herniated nucleus pulposus)   Discharge Diagnoses:  Lumbar stenosis L3-S, Left L3-4 HNP (herniated nucleus pulposus) s/p LUMBAR LAMINECTOMY/DECOMPRESSION MICRODISCECTOMY 3 LEVELS (Left)   Past Medical History  Diagnosis Date  . Hypertension   . Complication of anesthesia     1985 after Thyriodectomy hard time waking up  . Diabetes mellitus     taking meds  . Hypothyroidism     Had two surgeries for Cancer  . GERD (gastroesophageal reflux disease)     on meds  . Cancer     thyroid    Surgeries: Procedure(s): LUMBAR LAMINECTOMY/DECOMPRESSION MICRODISCECTOMY 3 LEVELS on 07/19/2011   Consultants:  1. Urology for difficulty voiding  Discharged Condition: Improved  Hospital Course: Tony [name] is an 61 y.o. male who was admitted 07/19/2011 for operative treatment of lumbar stenosis L3-S1 and left L3-4 HNP. Patient failed conservative treatments (please see the history and physical for the specifics) and had severe unremitting pain that affects sleep, daily activities, and work/hobbies. After pre-op clearance the patient was taken to the operating room on 07/19/2011 and underwent  Procedure(s): LUMBAR LAMINECTOMY/DECOMPRESSION MICRODISCECTOMY 3 LEVELS.    Patient was given perioperative antibiotics: Anti-infectives     Start     Dose/Rate Route Frequency Ordered Stop   07/19/11 1600   ceFAZolin (ANCEF) IVPB 1 g/50 mL premix        1 g 100 mL/hr over 30 Minutes Intravenous Every 8 hours 07/19/11 1402 07/20/11 0026   07/19/11 1115   vancomycin (VANCOCIN) powder 1,000 mg        1,000 mg Other To Surgery 07/19/11 1107 07/20/11 1115   07/18/11 1436   ceFAZolin (ANCEF) IVPB 2 g/50 mL premix        2 g 100 mL/hr over 30 Minutes Intravenous 60 min pre-op 07/18/11 1436  07/19/11 0745           Patient was given sequential compression devices and early ambulation to prevent DVT.   Patient benefited maximally from hospital stay and there were no complications. At the time of discharge, the patient was moving their bowels without difficulty, tolerating a regular diet, pain is controlled with oral pain medications and they have been cleared by PT/OT.   Recent vital signs: Patient Vitals for the past 24 hrs:  BP Temp Temp src Pulse Resp SpO2  07/20/11 0614 151/67 mmHg 99 F (37.2 C) - 99  20  100 %  07/20/11 0017 149/58 mmHg 98.5 F (36.9 C) - 93  20  99 %  07/19/11 2210 157/93 mmHg 98.2 F (36.8 C) Oral 102  18  95 %  07/19/11 1350 164/81 mmHg 98.8 F (37.1 C) Oral 87  16  93 %  07/19/11 1336 - - - 90  14  95 %  07/19/11 1335 - - - 88  13  95 %  07/19/11 1334 - - - 86  17  96 %  07/19/11 1333 - - - 85  10  98 %  07/19/11 1332 - - - 86  13  96 %  07/19/11 1331 - - - 86  14  95 %  07/19/11 1330 - - - 84  14  95 %  07/19/11 1329 - - - 84  14  95 %  07/19/11 1328 - - - 85  12  95 %  07/19/11 1233 - 98 F (36.7 C) - - - -  07/19/11 1230 - 99.4 F (37.4 C) - - - -     Recent laboratory studies:  Basename 07/18/11 0949  WBC 10.0  HGB 14.9  HCT 42.8  PLT 229  NA 140  K 4.4  CL 99  CO2 30  BUN 17  CREATININE 0.99  GLUCOSE 189*  INR 0.98  CALCIUM 10.0     Discharge Medications:   Medication List  As of 07/20/2011  7:34 AM   STOP taking these medications         HYDROcodone-acetaminophen 10-325 MG per tablet      ibuprofen 200 MG tablet      oxyCODONE-acetaminophen 5-325 MG per tablet         TAKE these medications         amLODipine 10 MG tablet   Commonly known as: NORVASC   Take 10 mg by mouth daily.      aspirin EC 81 MG tablet   Take 81 mg by mouth daily.      cyclobenzaprine 10 MG tablet   Commonly known as: FLEXERIL   Take 1 tablet (10 mg total) by mouth 3 (three) times daily. MAX 3 pills daily      glipiZIDE 10  MG 24 hr tablet   Commonly known as: GLUCOTROL XL   Take 10 mg by mouth daily.      levothyroxine 200 MCG tablet   Commonly known as: SYNTHROID, LEVOTHROID   Take 225 mcg by mouth daily.      metFORMIN 500 MG tablet   Commonly known as: GLUCOPHAGE   Take 500 mg by mouth 2 (two) times daily with a meal.      omeprazole 20 MG capsule   Commonly known as: PRILOSEC   Take 20 mg by mouth daily as needed. For acid reflux      ondansetron 4 MG tablet   Commonly known as: ZOFRAN   Take 1 tablet (4 mg total) by mouth every 8 (eight) hours as needed for nausea. MAX 3 pills daily      oxyCODONE-acetaminophen 10-325 MG per tablet   Commonly known as: PERCOCET   Take 1 tablet by mouth every 4 (four) hours as needed for pain. MAX 6 pills daily      polyethylene glycol packet   Commonly known as: MIRALAX / GLYCOLAX   Take 17 g by mouth daily. Take 1 packet daily until bowels become regular      pravastatin 20 MG tablet   Commonly known as: PRAVACHOL   Take 20 mg by mouth daily.      pregabalin 75 MG capsule   Commonly known as: LYRICA   Take 75 mg by mouth 2 (two) times daily.      quinapril-hydrochlorothiazide 20-12.5 MG per tablet   Commonly known as: ACCURETIC   Take 1 tablet by mouth daily.      sertraline 50 MG tablet   Commonly known as: ZOLOFT   Take 50 mg by mouth daily.            Diagnostic Studies:  Chest 2 View  07/18/2011  *RADIOLOGY REPORT*  Clinical Data: 61 year old male preoperative study for interval decompression.  Hypertension and diabetes.  CHEST - 2 VIEW  Comparison: None.  Findings: Lung volumes are within normal limits.  Mild elevation of the right hemidiaphragm.  Cardiac size and mediastinal contours are within normal limits.  Visualized tracheal air  column is within normal limits.  The lungs are clear.  No pneumothorax or effusion. No acute osseous abnormality identified.  IMPRESSION: No acute cardiopulmonary abnormality.  Original Report Authenticated By:  Harley Hallmark, M.D.   Dg Lumbar Spine 2-3 Views  07/19/2011  *RADIOLOGY REPORT*  Clinical Data: Low back pain  LUMBAR SPINE - 2-3 VIEW  Comparison: Plain films 07/18/2011  Findings: Film #1 demonstrates needles posteriorly directed most closely toward the L2-3 interspace and the L5 spinous process.  Film #2 demonstrates a blunt probe with posterior tissue spreaders posteriorly.  Probe was directed most closely toward the L4-L5 interspace.  Film #3 demonstrates a right  angled probe inferiorly directed most closely toward the L5 vertebral body and a slightly angled probe superiorly directed most closely toward the L3 vertebral body.  IMPRESSION: As above.  Original Report Authenticated By: Elsie Stain, M.D.   Dg Lumbar Spine 2-3 Views  07/18/2011  *RADIOLOGY REPORT*  Clinical Data: 61 year old male with planned lumbar surgery.  LUMBAR SPINE - 2-3 VIEW  Comparison: Chest radiographs 07/18/2011.  Findings: Normal lumbar segmentation.  Moderate to severe disc space loss with endplate spurring at L4-L5 and L5-S1.  Mild levoconvex lumbar scoliosis.  IMPRESSION: Normal lumbar segmentation.  Chronic L4-L5 and L5-S1 disc degeneration. The lumbar levels on these images were numbered in anticipation of surgery.  Original Report Authenticated By: Harley Hallmark, M.D.    Discharge Orders    Future Orders Please Complete By Expires   Diet - low sodium heart healthy      Call MD / Call 911      Comments:   If you experience chest pain or shortness of breath, CALL 911 and be transported to the hospital emergency room.  If you develope a fever above 101 F, pus (white drainage) or increased drainage or redness at the wound, or calf pain, call your surgeon's office.   Constipation Prevention      Comments:   Drink plenty of fluids.  Prune juice may be helpful.  You may use a stool softener, such as Colace (over the counter) 100 mg twice a day.  Use MiraLax (over the counter) for constipation as needed.   Increase  activity slowly as tolerated      Weight Bearing as taught in Physical Therapy      Comments:   Use a walker or crutches as instructed.   Discharge instructions      Comments:   Keep incision clean and dry.  Leave steri strips in place.  May shower 5 days from surgery; pat to dry following shower.  May redress with clean, dry dressing if you would like.  Do not apply any lotion/cream/ointment to the incision.      Driving restrictions      Comments:   No driving for 2 weeks.  Dr Shon Baton will discuss addition driving restrictions at your first post-op visit in 2 weeks.   Lifting restrictions      Comments:   No lifting anything greater than 5 pounds.  DO NOT reach overhead (above shoulder height).  NO bending, stooping or squatting.  Dr. Shon Baton will discuss additional lifting restrictions at your first post-op visit in 2 weeks.    Lumbar corsett      Face-to-face encounter (required for Medicare/Medicaid patients)      Comments:   I Gwinda Maine certify that this patient is under my care and that I, or a nurse practitioner or physician's assistant working with me,  had a face-to-face encounter that meets the physician face-to-face encounter requirements with this patient on 07/20/2011.       Questions: Responses:   The encounter with the patient was in whole, or in part, for the following medical condition, which is the primary reason for home health care left L3-S1 decompression/foraminotomy; left L3-4 disectomy   I certify that, based on my findings, the following services are medically necessary home health services Physical therapy   My clinical findings support the need for the above services OTHER SEE COMMENTS   Further, I certify that my clinical findings support that this patient is homebound (i.e. absences from home require considerable and taxing effort and are for medical reasons or religious services or infrequently or of short duration when for other reasons) Ambulates short  distances less than 300 feet   To provide the following care/treatments PT    OT      Follow-up Information    Follow up with Alvy Beal, MD in 2 weeks.   Contact information:   [facility] [address], [address] [place] [address] [phone]          Discharge Plan:  discharge to Home   Patient to contact urology for out patient appointment  Disposition: STABLE at the time of discharge    Signed: Gwinda Maine 07/20/2011, 7:34 AM

## 2011-07-20 NOTE — Progress Notes (Signed)
Pt repts that he is "uncomfortable in his bladder". Pt repts that he feels full. Pt repts that he has voided small freq amounts all day. Pt has voided a total of 800 cc via urinal today. Pt requests that foley be replaced. Pt has been ambulating without difficulty. Pt MAEx 4. No numbness or tingling noted of any of his extremities. Neuro check remains negative. Rept to Dr. Shon Baton. Orders received. Foley placed. Pt had immediate output of 700 cc after foley placed. Will continue to monitor.

## 2011-07-20 NOTE — Progress Notes (Addendum)
Subjective: Procedure(s) (LRB): LUMBAR LAMINECTOMY/DECOMPRESSION MICRODISCECTOMY 3 LEVELS (Left) 1 Day Post-Op  Patient reports pain as mild.  Reports none leg pain reports incisional back pain   Negative void Negative bowel movement Positive flatus Negative chest pain or shortness of breath  Objective: Vital signs in last 24 hours: Temp:  [98 F (36.7 C)-99.4 F (37.4 C)] 99 F (37.2 C) (03/22 4540) Pulse Rate:  [84-102] 99  (03/22 0614) Resp:  [10-20] 20  (03/22 0614) BP: (149-164)/(58-93) 151/67 mmHg (03/22 0614) SpO2:  [93 %-100 %] 100 % (03/22 0614)  Intake/Output from previous day: 03/21 0701 - 03/22 0700 In: 4945 [P.O.:160; I.V.:3750; IV Piggyback:950] Out: 3859 [Urine:3541; Drains:78; Blood:240]   Basename 07/18/11 0949  WBC 10.0  RBC 5.05  HCT 42.8  PLT 229    Basename 07/18/11 0949  NA 140  K 4.4  CL 99  CO2 30  BUN 17  CREATININE 0.99  GLUCOSE 189*  CALCIUM 10.0    Basename 07/18/11 0949  LABPT --  INR 0.98    ABD soft Neurovascular intact Sensation intact distally Intact pulses distally Dorsiflexion/Plantar flexion intact Incision: dressing C/D/I Compartment soft  Assessment/Plan: Patient stable  Monitor void today Continue antibiotics Continue mobilization with physical therapy Continue care  Maintain drain until tomorrow Up with therapy Plan for discharge tomorrow to home   Dr. Shon Baton spoke to and evaluated the patient this morning and agrees with the plan. Gwinda Maine 07/20/2011, 7:55 AM  Patient stable No radicular leg pain Area of skin abrasion to chin - 2nd to surgical positioning Will apply neosporin  Mobilization Monitor voiding. D/c drain later today or early Saturday Expect D/c to home Saturday afternoon.

## 2011-07-20 NOTE — Evaluation (Signed)
Physical Therapy Evaluation Patient Details Name: SANTANNA OLENIK MRN: [id number] DOB: 17-Jul-1950 Today's Date: 07/20/2011  Problem List:  Patient Active Problem List  Diagnoses  . Lumbar stenosis  . HNP (herniated nucleus pulposus), lumbar    Past Medical History:  Past Medical History  Diagnosis Date  . Hypertension   . Complication of anesthesia     1985 after Thyriodectomy hard time waking up  . Diabetes mellitus     taking meds  . Hypothyroidism     Had two surgeries for Cancer  . GERD (gastroesophageal reflux disease)     on meds  . Cancer     thyroid   Past Surgical History:  Past Surgical History  Procedure Date  . Thyroidectomy x2     PT Assessment/Plan/Recommendation PT Assessment Clinical Impression Statement: Pt s/p L3-S1 left decompression/laminectomy, currently presenting with acute pain and difficulty walking, and will benefit from skilled PT to address these impairments and safely return home with decreased falls risk and caregiver burden. Pt's wife will be with him through the weekend, but not next week, so pt will need to be Mod I for safe return home, which is highly likely.  PT Recommendation/Assessment: Patient will need skilled PT in the acute care venue PT Problem List: Decreased strength;Decreased activity tolerance;Decreased mobility;Decreased knowledge of precautions;Pain Barriers to Discharge: Decreased caregiver support Barriers to Discharge Comments: Wife will be working Mon-Thursday this week PT Therapy Diagnosis : Difficulty walking;Generalized weakness;Acute pain PT Plan PT Frequency: Min 5X/week PT Treatment/Interventions: Gait training;Stair training;Functional mobility training;Therapeutic activities;Therapeutic exercise;Patient/family education PT Recommendation Follow Up Recommendations: Home health PT;Supervision/Assistance - 24 hour Equipment Recommended: None recommended by PT PT Goals  Acute Rehab PT Goals PT Goal Formulation:  With patient/family Time For Goal Achievement: 7 days Pt will Roll Supine to Right Side: with modified independence PT Goal: Rolling Supine to Right Side - Progress: Goal set today Pt will Roll Supine to Left Side: with modified independence PT Goal: Rolling Supine to Left Side - Progress: Goal set today Pt will go Supine/Side to Sit: with modified independence;with HOB 0 degrees PT Goal: Supine/Side to Sit - Progress: Goal set today Pt will go Sit to Supine/Side: with modified independence;with HOB 0 degrees PT Goal: Sit to Supine/Side - Progress: Goal set today Pt will go Sit to Stand: with modified independence;with upper extremity assist PT Goal: Sit to Stand - Progress: Goal set today Pt will go Stand to Sit: with modified independence;with upper extremity assist PT Goal: Stand to Sit - Progress: Goal set today Pt will Ambulate: >150 feet;Independently PT Goal: Ambulate - Progress: Goal set today Pt will Go Up / Down Stairs: 3-5 stairs;with modified independence;with rail(s) (Has sturdy shelving on L side, no rail) PT Goal: Up/Down Stairs - Progress: Goal set today  PT Evaluation Precautions/Restrictions  Precautions Precautions: Back Precaution Booklet Issued: Yes (comment) Precaution Comments: Handout given Required Braces or Orthoses: Yes Spinal Brace: Lumbar corset (For comfort) Restrictions Weight Bearing Restrictions: No Prior Functioning  Home Living Lives With: Spouse Receives Help From: Family Type of Home: House Home Layout: Able to live on main level with bedroom/bathroom;Two level Alternate Level Stairs-Rails:  (NA) Alternate Level Stairs-Number of Steps: NA Home Access: Stairs to enter Entrance Stairs-Rails: None (Has large, stable shelving unit for support) Entrance Stairs-Number of Steps: 3 Bathroom Shower/Tub: Tub/shower unit;Curtain Firefighter: Standard Home Adaptive Equipment: Crutches;Walker - rolling Prior Function Level of Independence:  Independent with basic ADLs;Independent with gait;Independent with transfers Able to Take Stairs?: Yes  Driving: Yes Vocation: Full time employment Vocation Requirements: Sales promotion account executive Cognition Arousal/Alertness: Awake/alert Overall Cognitive Status: Appears within functional limits for tasks assessed Orientation Level: Oriented X4 Sensation/Coordination Sensation Light Touch: Appears Intact Coordination Gross Motor Movements are Fluid and Coordinated: Yes Extremity Assessment RLE Assessment RLE Assessment: Within Functional Limits LLE Assessment LLE Assessment: Within Functional Limits Mobility (including Balance) Bed Mobility Bed Mobility: Yes Rolling Right: 5: Supervision;With rail Rolling Right Details (indicate cue type and reason): Cues for technique Right Sidelying to Sit: 5: Supervision;With rails;HOB flat Right Sidelying to Sit Details (indicate cue type and reason): Cues for timing.  Sit to Sidelying Right: 5: Supervision;With rail;HOB flat Sit to Sidelying Right Details (indicate cue type and reason): Cues for timing Transfers Transfers: Yes Sit to Stand: 5: Supervision;With upper extremity assist;From bed Sit to Stand Details (indicate cue type and reason): Cues for maintaining back precautions Stand to Sit: 5: Supervision;To bed Stand to Sit Details: Cues to control descent Ambulation/Gait Ambulation/Gait: Yes Ambulation/Gait Assistance: 5: Supervision Ambulation/Gait Assistance Details (indicate cue type and reason): Cues for posture and to progress toward more fluid gait pattern as able over time as pt tends to walk with increased lateral sway and guarded gait.  Ambulation Distance (Feet): 300 Feet Assistive device: None Gait Pattern: Step-through pattern;Decreased stride length Stairs: Yes Stairs Assistance: 5: Supervision Stairs Assistance Details (indicate cue type and reason): Cues for RLE leads ascending, LLE leads descending Stair  Management Technique: One rail Left;Alternating pattern Number of Stairs: 3  Height of Stairs: 6   Posture/Postural Control Posture/Postural Control: No significant limitations    End of Session PT - End of Session Equipment Utilized During Treatment: Gait belt Activity Tolerance: Patient tolerated treatment well Patient left: in bed;with family/visitor present (Had been up for several hours this morning) Nurse Communication: Mobility status for transfers;Mobility status for ambulation General Behavior During Session: [facility] for tasks performed Cognition: [facility] for tasks performed  Virl Cagey, [place] [phone]  07/20/2011, 10:23 AM

## 2011-07-20 NOTE — Progress Notes (Signed)
Pt is s/p L3-S1 fusion. Pt has decreased ROM of back. Back precautions. +cms. Pt repts that preop he had mid to low back pain that radiated to L hip. Pt repts that preop s/sx are "gone" postop. Pt repts pain now is primarily "incision" pain. Neuro check is negative. Pt is alert and oriented x 3. Pt request "brace" for comfort when up. Will rept to MD/PA on rounds. Pt MAEx 4. No numbness or tingling noted. Lungs CTA. Heart rate regular rate and rhythm. No s/sx cardiac or resp distress. Pt performs IS per order. Vital signs stable. Pt denies cough. Pt is to void since foley d/ced. Pt repts LBM 3/21. Pt repts passing gas. Pt denies nausea or vomiting. Abdomen is soft flat nontender and nondistended. BS+x4. Pt tolerates diet without difficulty. Pt JP is draining mod amount of bldy drainage. Mepilex to low back is dry and intact.

## 2011-07-20 NOTE — Progress Notes (Signed)
Orthopedic Tech Progress Note Patient Details:  Tony [name] [birth year] [id number]  Patient ID: Tim Lair, male   DOB: 18-Dec-1950, 61 y.o.   MRN: [id number] Order completed by Storm Frisk, Tiffany Calmes 07/20/2011, 1:27 PM

## 2011-07-21 LAB — GLUCOSE, CAPILLARY: Glucose-Capillary: 202 mg/dL — ABNORMAL HIGH (ref 70–99)

## 2011-07-21 MED ORDER — BACITRACIN-NEOMYCIN-POLYMYXIN OINTMENT TUBE: 1.0000 "application " | TOPICAL_OINTMENT | Freq: Three times a day (TID) | CUTANEOUS | Status: DC

## 2011-07-21 MED ORDER — TAMSULOSIN HCL 0.4 MG PO CAPS: 0.4000 mg | ORAL_CAPSULE | Freq: Every morning | ORAL | Status: AC

## 2011-07-21 NOTE — Progress Notes (Signed)
CSW received consult for SNF placement. Plan is for home with [facility]. No other CSW needs identified. CSW signing off.  Dellie Burns, MSW, [place] [phone] (weekend)

## 2011-07-21 NOTE — Progress Notes (Signed)
Pt. Appears confused.  ? Effects of Robaxin vs. Pain medicine vs. UTI.  Urine amber in color with some sediment.  Urology in to consult.   See DAR note.  Fluids encouraged.  VSS at this time.

## 2011-07-21 NOTE — Progress Notes (Signed)
Tony [name] 61 y.o. 07/19/2011  LUMBAR STENOSIS Lumbar three-Sacral one WITH Lumbar three-Lumbar four LEFT HERNIATED DISC  2 Days Post-Op   Subjective Patient complaints:doing well without problems, doing well with some problems : difficulty voiding  Objective Back:  no straight leg raising pain.  nl rectal tone, and peri-rectal sensation Neuro exam: grossly normal Vascular: peripheral pulses symmetrical Abdomen: abdomen is soft without significant tenderness, masses, organomegaly or guarding Wound: dressing C/D/I  Lab. Results: Basename   07/18/11             0949      WBC        10.0      HGB        14.9      HCT        42.8      PLT        229       BMET Basename   07/18/11             0949      NA         140       K          4.4       CL         99        CO2        30        GLUCOSE    189*      BUN        17        CREATININE 0.99      CALCIUM    10.0       INR (no units)  Date                       Value       Range   Status  07/18/2011                   0.98    Final ---------- VITALS ----------------------------               07/21/11                      0534        ----------------------------  BP:           158/74        Pulse:         100          Temp:   101.2 F (38.4 C)  Resp:           18         ----------------------------   Chest 2 View  07/18/2011  *RADIOLOGY REPORT*  Clinical Data: 61 year old male preoperative study for interval decompression.  Hypertension and diabetes.  CHEST - 2 VIEW  Comparison: None.  Findings: Lung volumes are within normal limits.  Mild elevation of the right hemidiaphragm.  Cardiac size and mediastinal contours are within normal limits.  Visualized tracheal air column is within normal limits.  The lungs are clear.  No pneumothorax or effusion. No acute osseous abnormality identified.  IMPRESSION: No acute cardiopulmonary abnormality.  Original Report Authenticated By: Harley Hallmark, M.D.    Dg Lumbar Spine 2-3 Views  07/19/2011  *RADIOLOGY REPORT*  Clinical Data: Low back pain  LUMBAR SPINE - 2-3 VIEW  Comparison: Plain films 07/18/2011  Findings: Film #  1 demonstrates needles posteriorly directed most closely toward the L2-3 interspace and the L5 spinous process.  Film #2 demonstrates a blunt probe with posterior tissue spreaders posteriorly.  Probe was directed most closely toward the L4-L5 interspace.  Film #3 demonstrates a right  angled probe inferiorly directed most closely toward the L5 vertebral body and a slightly angled probe superiorly directed most closely toward the L3 vertebral body.  IMPRESSION: As above.  Original Report Authenticated By: Elsie Stain, M.D.   Dg Lumbar Spine 2-3 Views  07/18/2011  *RADIOLOGY REPORT*  Clinical Data: 61 year old male with planned lumbar surgery.  LUMBAR SPINE - 2-3 VIEW  Comparison: Chest radiographs 07/18/2011.  Findings: Normal lumbar segmentation.  Moderate to severe disc space loss with endplate spurring at L4-L5 and L5-S1.  Mild levoconvex lumbar scoliosis.  IMPRESSION: Normal lumbar segmentation.  Chronic L4-L5 and L5-S1 disc degeneration. The lumbar levels on these images were numbered in anticipation of surgery.  Original Report Authenticated By: Harley Hallmark, M.D.     Assessment/ Plan Patient: Postoperative course complicated by [name] retention Plan: Encourage ambulation & incentive spirometer Urology consult today.  No evidence of cauda equina syndrome.  Neuro exam normal.  Possible BPH as cause of post-op retention.   Disposition:   Discharge condition: Good Discharge destination: Home Plan for follow-up: 1: in 2 weeks    2: instructions provided (pre-printed)     3: scripts in chart    Tony [name] D 3/23/20137:05 AM

## 2011-07-21 NOTE — Progress Notes (Signed)
Urine obtained for Culture.

## 2011-07-21 NOTE — Discharge Instructions (Signed)
Home health to be provided by [facility]-[phone]  Call [facility] at [phone] following discharge setup an office visit for a voiding trial in approximately one week.

## 2011-07-21 NOTE — Consult Note (Signed)
Urology Consult   Physician requesting consult: Tony [name]  Reason for consult: Urinary retention  History of Present Illness: Tony [name] is a 61 y.o. male without prior urologic history who is 2 days postop from lower lumbar surgery performed by Dr. Shon [name] for disc herniation. He denies any long-standing urologic history, but prior to the surgery did have nocturia x2-3 with intermittency and slight hesitancy. It was not terribly bothersome, however. The patient has been followed by his primary care physician, Dr. Pete [name], with both PSA and digital rectal exams. By patient report, these have been normal.  Postoperatively, he has failed voiding trials, and has had residual urine volumes of up to 800 cc. He currently has a Foley catheter in place. Urologic consultation is requested. He has had a recent fever over 101.  He denies a history of  UTIs, STDs, urolithiasis, GU malignancy/trauma/surgery.  Past Medical History  Diagnosis Date  . Hypertension   . Complication of anesthesia     1985 after Thyriodectomy hard time waking up  . Diabetes mellitus     taking meds  . Hypothyroidism     Had two surgeries for Cancer  . GERD (gastroesophageal reflux disease)     on meds  . Cancer     thyroid    Past Surgical History  Procedure Date  . Thyroidectomy x2   . Lumbar laminectomy/decompression microdiscectomy 07/19/2011    Procedure: LUMBAR LAMINECTOMY/DECOMPRESSION MICRODISCECTOMY 3 LEVELS;  Surgeon: Tony Lick, MD;  Location: [facility] OR;  Service: Orthopedics;  Laterality: Left;  Lumbar three-Lumbar five LEFT DECOMPRESSION AND FORAMINOTOMY Lumbar three-four LEFT DISCECTOMY    Medications: Scheduled Meds:   . amLODipine  10 mg Oral Daily  .  ceFAZolin (ANCEF) IV  1 g Intravenous Q8H  . docusate sodium  100 mg Oral BID  . glipiZIDE  10 mg Oral Q breakfast  . lisinopril  20 mg Oral Daily   And  . hydrochlorothiazide  12.5 mg Oral Daily  . insulin aspart  0-15 Units Subcutaneous TID WC   . insulin aspart  0-5 Units Subcutaneous QHS  . levothyroxine  225 mcg Oral QAC breakfast  . metFORMIN  500 mg Oral BID WC  . neomycin-bacitracin-polymyxin   Topical TID  . pantoprazole  40 mg Oral Q1200  . sertraline  50 mg Oral Daily  . sodium chloride  3 mL Intravenous Q12H  . Tamsulosin HCl  0.4 mg Oral q morning - 10a  . Tamsulosin HCl  0.4 mg Oral Once   Continuous Infusions:   . sodium chloride    . lactated ringers 85 mL/hr at 07/19/11 1811   PRN Meds:.menthol-cetylpyridinium, methocarbamol (ROBAXIN) IV, methocarbamol, morphine, ondansetron (ZOFRAN) IV, oxyCODONE, phenol, sodium chloride, zolpidem  Allergies:  Allergies  Allergen Reactions  . Crestor (Rosuvastatin Calcium) Other (See Comments)    Leg pain  . Simvastatin Other (See Comments)    Leg pain    Family History  Problem Relation Age of Onset  . Anesthesia problems Neg Hx   . Hypotension Neg Hx   . Pseudochol deficiency Neg Hx     Social History:  reports that he has never smoked. He does not have any smokeless tobacco history on file. He reports that he does not drink alcohol or use illicit drugs.  ROS: A complete review of systems was performed.  All systems are negative except for pertinent findings as noted.  Physical Exam:  Vital signs in last 24 hours: Temp:  [99 F (37.2 C)-101.2 F (38.4 C)]  101.2 F (38.4 C) (03/23 0534) Pulse Rate:  [92-108] 100  (03/23 0534) Resp:  [18-20] 18  (03/23 0534) BP: (114-158)/(57-74) 158/74 mmHg (03/23 0534) SpO2:  [93 %-97 %] 95 % (03/23 0534) General:  Alert and oriented, No acute distress HEENT: Normocephalic, atraumatic   Laboratory Data:  No results found for this basename: WBC:5,HGB:5,HCT:5,PLT:5 in the last 72 hours  No results found for this basename: NA:5,K:5,CL:5,CO3:5,GLUCOSE:5,BUN:5,CALCIUM:5,CREATININE:5 in the last 72 hours   Results for orders placed during the hospital encounter of 07/19/11 (from the past 24 hour(s))  GLUCOSE, CAPILLARY      Status: Abnormal   Collection Time   07/20/11 11:44 AM      Component Value Range   Glucose-Capillary 178 (*) 70 - 99 (mg/dL)  GLUCOSE, CAPILLARY     Status: Abnormal   Collection Time   07/20/11  4:33 PM      Component Value Range   Glucose-Capillary 220 (*) 70 - 99 (mg/dL)   Comment 1 Notify RN    GLUCOSE, CAPILLARY     Status: Abnormal   Collection Time   07/20/11 10:29 PM      Component Value Range   Glucose-Capillary 196 (*) 70 - 99 (mg/dL)   Comment 1 Notify RN    GLUCOSE, CAPILLARY     Status: Abnormal   Collection Time   07/21/11  7:07 AM      Component Value Range   Glucose-Capillary 150 (*) 70 - 99 (mg/dL)   Recent Results (from the past 240 hour(s))  SURGICAL PCR SCREEN     Status: Normal   Collection Time   07/18/11  9:48 AM      Component Value Range Status Comment   MRSA, PCR NEGATIVE  NEGATIVE  Final    Staphylococcus aureus NEGATIVE  NEGATIVE  Final     Renal Function:  Basename 07/18/11 0949  CREATININE 0.99   CrCl is unknown because there is no height on file for the current visit.  Radiologic Imaging: No results found.  I independently reviewed the above imaging studies.  Impression/Assessment:  1. Postoperative urinary retention. This is most likely secondary to the patient's recent surgery, weakness, pain and perioperative medications. Additionally, he does have pre-existing symptoms of bladder outlet obstruction, although mild.  2. Postoperative fever. He has been catheterized couple of times.  Plan:  1. I will send his urine for culture  2. The patient is on Flomax-I would continue this until he sees [place] in the office  3. I would recommend that he go home with the catheter area I will have the nursing staff teach him how to change from a large back to a leg bag.  4. I gave the patient our office number. He will call for an office visit to be seen about a week post discharge for voiding trial. I did encourage him that this is a common issue  after surgery, and should resolve without difficulty.  [name] 07/21/2011, 11:16 AM    Bertram Millard. Tony Ngu MD

## 2011-07-21 NOTE — Progress Notes (Addendum)
Physical Therapy Treatment Patient Details Name: Tony [name] MRN: [id number] DOB: 1950-12-18 Today's Date: 07/21/2011  PT Assessment/Plan  PT - Assessment/Plan Comments on Treatment Session: Pt reports not getting any sleep last night- "Last night was rough" due to pain/spasms/inability to urinate.  Pt required a little more (A) today compared to yesterdays session.  Pt's wife expresses concern about pt possibly d/cing home today & states she does not feel comfortable with him going today due to needing increased (A) as well as mild confusion per wife.   PT Frequency: Min 5X/week Follow Up Recommendations: Home health PT;Supervision/Assistance - 24 hour Equipment Recommended: Tub/shower bench;3 in 1 bedside comode; RW per wife's request PT Goals  Acute Rehab PT Goals PT Goal: Rolling Supine to Right Side - Progress: Not met PT Goal: Supine/Side to Sit - Progress: Met PT Goal: Sit to Stand - Progress: Not met PT Goal: Stand to Sit - Progress: Not met PT Goal: Ambulate - Progress: Not met  PT Treatment Precautions/Restrictions  Precautions Precautions: Back Precaution Booklet Issued: Yes (comment) Precaution Comments: Pt unable to recall any of the back precautions.  Re-educated pt on all 3 precautions.   Required Braces or Orthoses: Yes Spinal Brace: Lumbar corset Restrictions Weight Bearing Restrictions: No Mobility (including Balance) Bed Mobility Rolling Right: With rail;5: Supervision Rolling Right Details (indicate cue type and reason): cues to reinforce back precations.  Pt relied heavily on rail to (A) with pushing his shoulders/trunk to sitting upright.   Right Sidelying to Sit: 6: Modified independent (Device/Increase time);HOB flat;With rails Right Sidelying to Sit Details (indicate cue type and reason): relied heavily on rail.  Increased time to transition.   Transfers Sit to Stand: Other (comment);From bed;From chair/3-in-1;With upper extremity assist;With armrests  (Min Guard (A)) Sit to Stand Details (indicate cue type and reason): cues for hand placement, reinforcement of back precatuions.  Close guarding today due to pt c/o spasms/pain & RN reporting pt with difficulty standing earlier in AM.   Stand to Sit: Other (comment);To chair/3-in-1;With upper extremity assist;With armrests (Min Guard (A)) Stand to Sit Details: cues to locate armrests of chair without twisting & use of UE's to control descent.   Ambulation/Gait Ambulation/Gait Assistance: 4: Min assist;Other (comment) (Min Guard (A)) Ambulation/Gait Assistance Details (indicate cue type and reason): Min (A) with HHA on Lt for stability due to pt reporting he feels a little more unsteady today.  Pt reaching for rail/wall in hallway for support with Rt UE.  Pt reports pain & spasms.  Pt used RW for support/stability 2nd half of ambulation.  Pt with slow, small steps, & guarded gait, with increased lateral sway as well.  Pt required a seated rest break at midpoint due to pain per pt.   Ambulation Distance (Feet): 100 Feet Assistive device: Rolling walker;None Gait Pattern: Step-through pattern;Decreased stride length;Decreased step length - right;Decreased step length - left Wheelchair Mobility Wheelchair Mobility: No  Posture/Postural Control Posture/Postural Control: No significant limitations Balance Balance Assessed: No Exercise    End of Session PT - End of Session Equipment Utilized During Treatment: Gait belt;Back brace Activity Tolerance: Patient limited by pain;Patient limited by fatigue Patient left: in chair;with call bell in reach;with family/visitor present General Behavior During Session: [facility] for tasks performed Cognition: [facility] for tasks performed  Lara Mulch 07/21/2011, 12:19 PM [phone]

## 2011-07-22 LAB — GLUCOSE, CAPILLARY
Glucose-Capillary: 164 mg/dL — ABNORMAL HIGH (ref 70–99)
Glucose-Capillary: 184 mg/dL — ABNORMAL HIGH (ref 70–99)

## 2011-07-22 LAB — URINE CULTURE
Colony Count: NO GROWTH
Culture  Setup Time: 201303232158

## 2011-07-22 NOTE — Progress Notes (Signed)
Orthopedics Progress Note  Subjective: Pt c/o mild to moderate soreness but he is doing much better and is ready for discharge home Pt will keep the foley   Objective:  Filed Vitals:   07/22/11 0549  BP: 119/66  Pulse: 95  Temp: 99.6 F (37.6 C)  Resp: 18    General: Awake and alert  Musculoskeletal: lumbar incision healing well, nv intact distally Neurovascularly intact  Lab Results  Component Value Date   WBC 10.0 07/18/2011   HGB 14.9 07/18/2011   HCT 42.8 07/18/2011   MCV 84.8 07/18/2011   PLT 229 07/18/2011       Component Value Date/Time   NA 140 07/18/2011 0949   K 4.4 07/18/2011 0949   CL 99 07/18/2011 0949   CO2 30 07/18/2011 0949   GLUCOSE 189* 07/18/2011 0949   BUN 17 07/18/2011 0949   CREATININE 0.99 07/18/2011 0949   CALCIUM 10.0 07/18/2011 0949   GFRNONAA 86* 07/18/2011 0949   GFRAA >90 07/18/2011 0949    Lab Results  Component Value Date   INR 0.98 07/18/2011    Assessment/Plan: POD #3 s/p Procedure(s): LUMBAR LAMINECTOMY/DECOMPRESSION MICRODISCECTOMY 3 LEVELS  Plan to discharge home today F/u in 2 weeks  Almedia Balls. Ranell Patrick, MD 07/22/2011 8:31 AM

## 2011-07-22 NOTE — Progress Notes (Signed)
   CARE MANAGEMENT NOTE 07/22/2011  Patient:  Tony [name], Tony [name]   Account Number:  [id number]  Date Initiated:  07/20/2011  Documentation initiated by:  Vance Peper  Subjective/Objective Assessment:   61 yr old male s/p L3-S1 fusion     Action/Plan:   Patient is covered by worker's comp. Adjuster is Ilda Mori @ [phone]. Nurse case manager is Salvatore [phone] ext [phone]. or [phone].called and left message, will follow   Anticipated DC Date:  07/21/2011   Anticipated DC Plan:  HOME W HOME HEALTH SERVICES      DC Planning Services  CM consult      [facility] Choice  HOME HEALTH   Choice offered to / List presented to:     DME arranged  3-N-1  Levan Hurst      DME agency  [facility]     HH arranged  HH-2 PT      [facility] agency  [facility]   Status of service:  Completed, signed off Medicare Important Message given?   (If response is "NO", the following Medicare IM given date fields will be blank) Date Medicare IM given:   Date Additional Medicare IM given:    Discharge Disposition:  HOME W HOME HEALTH SERVICES  Per UR Regulation:    If discussed at Long Length of Stay Meetings, dates discussed:    Comments:  07/22/2011 1400 Faxed orders to [facility] for [facility]. Contacted [facility] for DME for scheduled d/c today. Will contact Adjuster to get billing information so [facility] can bill for DME. Isidoro Donning RN CCM Case Mgmt phone [phone]  07/20/11 1445 Vance Peper, RN BSN Case Manger Received call from [facility], they have arranged for [facility] to provide Home Health.

## 2011-07-22 NOTE — Progress Notes (Signed)
The patient has had no difficulties since my initial consultation yesterday  He should go home with his catheter, nurses will teach drainage bag management  He also should go home on Flomax 0.4 mg daily. He has instructions to call my office followup.

## 2011-07-22 NOTE — Progress Notes (Signed)
PT Cancellation Note  Treatment cancelled today due to patient's refusal to participate. Pt feels he is comfortable with all aspects of mobility and does not need further education.  Pt could not recall back precautions. Reeducated pt and spouse on 3/3 back precautions.  Sallyanne Kuster 07/22/2011, 9:19 AM  Sallyanne Kuster, PTA Office- [phone] Pager- [phone]

## 2011-07-23 NOTE — Progress Notes (Signed)
Tony [name] 61 y.o. 07/19/2011  LUMBAR STENOSIS Lumbar three-Sacral one WITH Lumbar three-Lumbar four LEFT HERNIATED DISC  4 Days Post-Op   Subjective Patient complaints:doing well with some problems : urinary retention.    Objective Back:  palpable pain and discomfort.  Spasm pain only. No radicular leg pain Neuro exam: grossly normal Vascular: peripheral pulses symmetrical Abdomen: abdomen is soft without significant tenderness, masses, organomegaly or guarding  Positive flatus/BM. Wound: dressing C/D/I  Lab. Results: No results found for this basename:  WBC:2,HGB:2,HCT:2,PLT:2 in the last 72 hours BMET No results found for this basename:  NA:2,K:2,CL:2,CO2:2,GLUCOSE:2,BUN:2,CREATININE:2,CALCIUM:2,CBG:2 in the last 72 hours  INR (no units)  Date                       Value       Range   Status  07/18/2011                   0.98    Final ---------- VITALS ---------------------------              07/23/11                     0612        ---------------------------  BP:          149/70        Pulse:         78          Temp:   99.2 F (37.3 C)  Resp:          18         ---------------------------   Chest 2 View  07/18/2011  *RADIOLOGY REPORT*  Clinical Data: 61 year old male preoperative study for interval decompression.  Hypertension and diabetes.  CHEST - 2 VIEW  Comparison: None.  Findings: Lung volumes are within normal limits.  Mild elevation of the right hemidiaphragm.  Cardiac size and mediastinal contours are within normal limits.  Visualized tracheal air column is within normal limits.  The lungs are clear.  No pneumothorax or effusion. No acute osseous abnormality identified.  IMPRESSION: No acute cardiopulmonary abnormality.  Original Report Authenticated By: Harley Hallmark, M.D.   Dg Lumbar Spine 2-3 Views  07/19/2011  *RADIOLOGY REPORT*  Clinical Data: Low back pain  LUMBAR SPINE - 2-3 VIEW  Comparison: Plain films 07/18/2011  Findings:  Film #1 demonstrates needles posteriorly directed most closely toward the L2-3 interspace and the L5 spinous process.  Film #2 demonstrates a blunt probe with posterior tissue spreaders posteriorly.  Probe was directed most closely toward the L4-L5 interspace.  Film #3 demonstrates a right  angled probe inferiorly directed most closely toward the L5 vertebral body and a slightly angled probe superiorly directed most closely toward the L3 vertebral body.  IMPRESSION: As above.  Original Report Authenticated By: Elsie Stain, M.D.   Dg Lumbar Spine 2-3 Views  07/18/2011  *RADIOLOGY REPORT*  Clinical Data: 61 year old male with planned lumbar surgery.  LUMBAR SPINE - 2-3 VIEW  Comparison: Chest radiographs 07/18/2011.  Findings: Normal lumbar segmentation.  Moderate to severe disc space loss with endplate spurring at L4-L5 and L5-S1.  Mild levoconvex lumbar scoliosis.  IMPRESSION: Normal lumbar segmentation.  Chronic L4-L5 and L5-S1 disc degeneration. The lumbar levels on these images were numbered in anticipation of surgery.  Original Report Authenticated By: Harley Hallmark, M.D.     Assessment/ Plan Patient: Postoperative course complicated by urinary retention.  Evaluated  by urology - plan in place for follow-up Plan: Encourage ambulation & incentive spirometer Plan on d/c today know that retention has been addressed.  Will be d/c'ed with foley catheter - instructions provided Disposition:   Discharge condition: Good Discharge destination: Home Plan for follow-up: 1: in 2 weeks    2: instructions provided (pre-printed)     3: scripts in chart    Tony [name] D 3/25/20138:01 AM

## 2011-07-23 NOTE — Progress Notes (Signed)
Pt discharged home with wife.  Verbalizes understanding of all discharge instructions and prescriptions.

## 2011-07-23 NOTE — Progress Notes (Signed)
Occupational Therapy Treatment Patient Details Name: GREYSON PEAVY MRN: [id number] DOB: [birth year] Today's Date: 07/23/2011  OT Assessment/Plan OT Assessment/Plan Comments on Treatment Session: Pt. progressing well today and anticipates D/C home. OT Plan: Discharge plan remains appropriate OT Frequency: Min 2X/week Follow Up Recommendations: No OT follow up;Supervision - Intermittent Equipment Recommended: Tub/shower bench;3 in 1 bedside comode OT Goals Acute Rehab OT Goals OT Goal Formulation: With patient Time For Goal Achievement: 7 days ADL Goals Pt Will Perform Tub/Shower Transfer: Tub transfer;with supervision;with DME;Transfer tub bench ADL Goal: Tub/Shower Transfer - Progress: Met Additional ADL Goal #1: Pt. will recall 3 back precautions. ADL Goal: Additional Goal #1 - Progress: Progressing toward goals  OT Treatment Precautions/Restrictions  Precautions Precautions: Back Precaution Booklet Issued: Yes (comment) Precaution Comments: Pt unable to recall any of the back precautions.  Re-educated pt on all 3 precautions.   Required Braces or Orthoses: Yes Spinal Brace: Lumbar corset Restrictions Weight Bearing Restrictions: No   ADL ADL Grooming: Performed;Wash/dry hands;Set up;Supervision/safety Grooming Details (indicate cue type and reason): Min verbal cues for no bending at the sink Where Assessed - Grooming: Standing at sink Lower Body Bathing: Simulated;Set up Lower Body Bathing Details (indicate cue type and reason): With use of long handled sponge Where Assessed - Lower Body Bathing: Sitting, chair Tub/Shower Transfer: Simulated;Supervision/safety Tub/Shower Transfer Details (indicate cue type and reason): Mod verbal cues for following back precautions and for transfer technique Tub/Shower Transfer Equipment: Transfer tub bench Equipment Used: Rolling walker Ambulation Related to ADLs: pt. provided with supervision with ~75' with RW use.  ADL Comments: Pt.  unable to recall 3/3 back precautions and educated on 3 back precautions and techniques for completing LB ADLs with following precautions. Pt's wife also educated on techniques. Mobility  Bed Mobility Bed Mobility: Yes Rolling Right: With rail;5: Supervision Right Sidelying to Sit: 6: Modified independent (Device/Increase time);HOB flat;With rails Sit to Sidelying Right: 6: Modified independent (Device/Increase time) Transfers Transfers: Yes Sit to Stand: 6: Modified independent (Device/Increase time)     End of Session OT - End of Session Equipment Utilized During Treatment: Gait belt;Back brace Activity Tolerance: Patient tolerated treatment well Patient left: in bed;with call bell in reach Nurse Communication: Mobility status for transfers General Behavior During Session: [facility] for tasks performed Cognition: [facility] for tasks performed  Camillo Quadros, OTR/L Pager [phone]  07/23/2011, 8:54 AM

## 2011-07-23 NOTE — Progress Notes (Signed)
Physical Therapy Treatment Note   07/23/11 0832  PT Visit Information  Last PT Received On 07/21/11  Precautions  Precautions Back  Precaution Booklet Issued Yes (comment)  Precaution Comments Pt con't to be unable to recall any of the 3 back precautions despite re-education  Required Braces or Orthoses Yes  Spinal Brace Lumbar corset  Restrictions  Weight Bearing Restrictions No  Bed Mobility  Rolling Right 6: Modified independent (Device/Increase time);With rail  Rolling Right Details (indicate cue type and reason) cues to adhere to back precautions  Right Sidelying to Sit 6: Modified independent (Device/Increase time) (increased time)  Sit to Sidelying Right 6: Modified independent (Device/Increase time) (did not use bed rail)  Sit to Sidelying Right Details (indicate cue type and reason) increased time, able to manage LEs  Transfers  Sit to Stand 6: Modified independent (Device/Increase time)  Sit to Stand Details (indicate cue type and reason) cue to decrease trunk bending  Stand to Sit To bed;6: Modified independent (Device/Increase time)  Stand to Sit Details cues to decrease trunk bending  Ambulation/Gait  Ambulation/Gait Assistance 4: Min assist (contact guard)  Ambulation/Gait Assistance Details (indicate cue type and reason) trialed amb without AD however pt with decreased stabilty requiring contact guard, patient amb with RW and demonstrated increased safety wtih RW compared to no AD.  Ambulation Distance (Feet) 200 Feet  Assistive device Rolling walker  Gait Pattern Step-through pattern;Decreased stride length  Stairs Yes  Stairs Assistance 5: Supervision (guard)  Stairs Assistance Details (indicate cue type and reason) verbal cues for sequencing  Stair Management Technique One rail Left;Alternating pattern  Number of Stairs 3   Wheelchair Mobility  Wheelchair Mobility No  Balance  Balance Assessed (pt requires RW for safe amb)  PT - End of Session  Equipment  Utilized During Treatment Back brace  Patient left in bed;with family/visitor present (in sidelying with pillow between knees)  General  Behavior During Session [facility] for tasks performed  [facility]ognition [facility] for tasks performed  PT - Assessment/Plan  Comments on Treatment Session Pt con't to have decreased ability to recall back precautions. Patient con't to require supervision at home for safe mobiltiy.  PT Plan Discharge plan remains appropriate  PT Frequency Min 5X/week  Follow Up Recommendations Home health PT;Supervision/Assistance - 24 hour  Equipment Recommended Rolling walker with 5" wheels;3 in 1 bedside comode;Tub/shower seat  Acute Rehab PT Goals  PT Goal: Rolling Supine to Right Side - Progress Progressing toward goal  PT Goal: Rolling Supine to Left Side - Progress Progressing toward goal  PT Goal: Supine/Side to Sit - Progress Met  PT Goal: Sit to Supine/Side - Progress Progressing toward goal  PT Goal: Sit to Stand - Progress Progressing toward goal  PT Goal: Stand to Sit - Progress Progressing toward goal  PT Goal: Ambulate - Progress Progressing toward goal  PT Goal: Up/Down Stairs - Progress Progressing toward goal    Pain: 2/10 surgical low back pain  Lewis Shock, PT, DPT Pager #: [phone] Office #: [phone]

## 2011-12-07 ENCOUNTER — Encounter (HOSPITAL_COMMUNITY): Payer: Self-pay | Admitting: Emergency Medicine

## 2011-12-07 ENCOUNTER — Emergency Department (HOSPITAL_COMMUNITY): Payer: No Typology Code available for payment source

## 2011-12-07 ENCOUNTER — Emergency Department (HOSPITAL_COMMUNITY)
Admission: EM | Admit: 2011-12-07 | Discharge: 2011-12-07 | Disposition: A | Discharge status: Home / Self Care | Payer: No Typology Code available for payment source | Attending: Emergency Medicine | Admitting: Emergency Medicine

## 2011-12-07 DIAGNOSIS — T148XXA Other injury of unspecified body region, initial encounter: Secondary | ICD-10-CM

## 2011-12-07 DIAGNOSIS — Y998 Other external cause status: Secondary | ICD-10-CM | POA: Insufficient documentation

## 2011-12-07 DIAGNOSIS — E039 Hypothyroidism, unspecified: Secondary | ICD-10-CM | POA: Insufficient documentation

## 2011-12-07 DIAGNOSIS — Y93I9 Activity, other involving external motion: Secondary | ICD-10-CM | POA: Insufficient documentation

## 2011-12-07 DIAGNOSIS — I10 Essential (primary) hypertension: Secondary | ICD-10-CM | POA: Insufficient documentation

## 2011-12-07 DIAGNOSIS — IMO0002 Reserved for concepts with insufficient information to code with codable children: Secondary | ICD-10-CM | POA: Insufficient documentation

## 2011-12-07 DIAGNOSIS — Z85850 Personal history of malignant neoplasm of thyroid: Secondary | ICD-10-CM | POA: Insufficient documentation

## 2011-12-07 DIAGNOSIS — E119 Type 2 diabetes mellitus without complications: Secondary | ICD-10-CM | POA: Insufficient documentation

## 2011-12-07 NOTE — ED Provider Notes (Signed)
Medical screening examination/treatment/procedure(s) were performed by non-physician practitioner and as supervising physician I was immediately available for consultation/collaboration.  Flint Melter, MD 12/07/11 [phone]
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
## 2012-04-17 ENCOUNTER — Other Ambulatory Visit (HOSPITAL_COMMUNITY): Payer: Self-pay | Admitting: Cardiovascular Disease

## 2012-04-17 DIAGNOSIS — R0989 Other specified symptoms and signs involving the circulatory and respiratory systems: Secondary | ICD-10-CM

## 2012-04-17 DIAGNOSIS — R0609 Other forms of dyspnea: Secondary | ICD-10-CM

## 2012-04-17 DIAGNOSIS — I119 Hypertensive heart disease without heart failure: Secondary | ICD-10-CM

## 2012-04-24 ENCOUNTER — Ambulatory Visit (HOSPITAL_COMMUNITY)
Admission: RE | Admit: 2012-04-24 | Discharge: 2012-04-24 | Disposition: A | Discharge status: Home / Self Care | Payer: BC Managed Care – PPO | Source: Ambulatory Visit | Attending: Cardiovascular Disease | Admitting: Cardiovascular Disease

## 2012-04-24 DIAGNOSIS — R0609 Other forms of dyspnea: Secondary | ICD-10-CM | POA: Insufficient documentation

## 2012-04-24 DIAGNOSIS — I10 Essential (primary) hypertension: Secondary | ICD-10-CM | POA: Insufficient documentation

## 2012-04-24 DIAGNOSIS — Z8249 Family history of ischemic heart disease and other diseases of the circulatory system: Secondary | ICD-10-CM | POA: Insufficient documentation

## 2012-04-24 DIAGNOSIS — E663 Overweight: Secondary | ICD-10-CM | POA: Insufficient documentation

## 2012-04-24 DIAGNOSIS — R0989 Other specified symptoms and signs involving the circulatory and respiratory systems: Secondary | ICD-10-CM | POA: Insufficient documentation

## 2012-04-24 DIAGNOSIS — E119 Type 2 diabetes mellitus without complications: Secondary | ICD-10-CM | POA: Insufficient documentation

## 2012-04-24 DIAGNOSIS — E785 Hyperlipidemia, unspecified: Secondary | ICD-10-CM | POA: Insufficient documentation

## 2012-04-24 MED ORDER — TECHNETIUM TC 99M SESTAMIBI GENERIC - CARDIOLITE
30.1000 | Freq: Once | INTRAVENOUS | Status: AC | PRN
  Administered 2012-04-24: 30.1 via INTRAVENOUS

## 2012-04-24 MED ORDER — REGADENOSON 0.4 MG/5ML IV SOLN
0.4000 mg | Freq: Once | INTRAVENOUS | Status: AC
  Administered 2012-04-24: 0.4 mg via INTRAVENOUS

## 2012-04-24 MED ORDER — TECHNETIUM TC 99M SESTAMIBI GENERIC - CARDIOLITE
11.0000 | Freq: Once | INTRAVENOUS | Status: AC | PRN
  Administered 2012-04-24: 11 via INTRAVENOUS

## 2012-04-24 NOTE — Procedures (Addendum)
[facility] [address] [place] [address] [name] [address] [phone]  Cardiology Nuclear Med Study  Tony [name] is a 61 y.o. male     MRN : [id number]     DOB: 1950-07-15  Procedure Date: 04/24/2012  Nuclear Med Background Indication for Stress Test:  Evaluation for Ischemia and Abnormal EKG History:  No prior cardiac history Cardiac Risk Factors: Family History - CAD, Hypertension, Lipids, NIDDM and Overweight  Symptoms:  none   Nuclear Pre-Procedure Caffeine/Decaff Intake:  7:00pm NPO After: 5:00am   IV Site: R Antecubital  IV 0.9% NS with Angio Cath:  22g  Chest Size (in):  46 IV Started by: Koren Shiver, CNMT  Height: 6' (1.829 m)  Cup Size: n/a  BMI:  Body mass index is 35.53 kg/(m^2). Weight:  262 lb (118.842 kg)   Tech Comments:  n/a    Nuclear Med Study 1 or 2 day study: 1 day  Stress Test Type:  Lexiscan  Order Authorizing Provider:  Nicki Guadalajara, MD   Resting Radionuclide: Technetium 28m Sestamibi  Resting Radionuclide Dose: 11.0 mCi   Stress Radionuclide:  Technetium 59m Sestamibi  Stress Radionuclide Dose: 30.1 mCi           Stress Protocol Rest HR: 62 Stress HR: 67  Rest BP: 148/93 Stress BP: 145/74  Exercise Time (min): n/a METS: n/a   Predicted Max HR: 158 bpm % Max HR: 42.41 bpm Rate Pressure Product: 44034   Dose of Adenosine (mg):  n/a Dose of Lexiscan: 0.4 mg  Dose of Atropine (mg): n/a Dose of Dobutamine: n/a mcg/kg/min (at max HR)  Stress Test Technologist: Esperanza Sheets, CCT Nuclear Technologist: Gonzella Lex, CNMT   Rest Procedure:  Myocardial perfusion imaging was performed at rest 45 minutes following the intravenous administration of Technetium 58m Sestamibi. Stress Procedure:  The patient received IV Lexiscan 0.4 mg over 15-seconds.  Technetium 12m Sestamibi injected at 30-seconds.  There were no significant changes with Lexiscan.  Quantitative spect images were obtained after a 45  minute delay.  Transient Ischemic Dilatation (Normal <1.22):  1.18 Lung/Heart Ratio (Normal <0.45):  0.31 QGS EDV:  147 ml QGS ESV:  64 ml LV Ejection Fraction: 57%  Signed by       Rest ECG: NSR-RBBB  Stress ECG: No significant change from baseline ECG  QPS Raw Data Images:  Normal; no motion artifact; normal heart/lung ratio. Stress Images:  Normal homogeneous uptake in all areas of the myocardium. Rest Images:  Normal homogeneous uptake in all areas of the myocardium. Subtraction (SDS):  No evidence of ischemia.  Impression Exercise Capacity:  Lexiscan with no exercise. BP Response:  Normal blood pressure response. Clinical Symptoms:  No significant symptoms noted. ECG Impression:  No significant ST segment change suggestive of ischemia. Comparison with Prior Nuclear Study: No previous nuclear study performed  Overall Impression:  Normal stress nuclear study.  LV Wall Motion:  NL LV Function; NL Wall Motion   Runell Gess, MD  04/25/2012 5:56 PM

## 2012-04-25 ENCOUNTER — Ambulatory Visit (HOSPITAL_COMMUNITY)
Admission: RE | Admit: 2012-04-25 | Discharge: 2012-04-25 | Disposition: A | Discharge status: Home / Self Care | Payer: BC Managed Care – PPO | Source: Ambulatory Visit | Attending: Cardiovascular Disease | Admitting: Cardiovascular Disease

## 2012-04-25 DIAGNOSIS — I119 Hypertensive heart disease without heart failure: Secondary | ICD-10-CM | POA: Insufficient documentation

## 2012-04-25 NOTE — Progress Notes (Signed)
[facility]   2D echo completed 04/25/2012.   Cindy Drue Harr, RDCS   

## 2012-04-29 ENCOUNTER — Encounter (HOSPITAL_COMMUNITY): Payer: Self-pay

## 2012-06-03 ENCOUNTER — Encounter (HOSPITAL_COMMUNITY)
Admission: RE | Admit: 2012-06-03 | Discharge: 2012-06-03 | Disposition: A | Discharge status: Home / Self Care | Payer: Self-pay | Source: Ambulatory Visit | Attending: Cardiovascular Disease | Admitting: Cardiovascular Disease

## 2012-06-03 DIAGNOSIS — R0989 Other specified symptoms and signs involving the circulatory and respiratory systems: Secondary | ICD-10-CM | POA: Insufficient documentation

## 2012-06-03 DIAGNOSIS — Z5189 Encounter for other specified aftercare: Secondary | ICD-10-CM | POA: Insufficient documentation

## 2012-06-03 DIAGNOSIS — E119 Type 2 diabetes mellitus without complications: Secondary | ICD-10-CM | POA: Insufficient documentation

## 2012-06-03 DIAGNOSIS — I119 Hypertensive heart disease without heart failure: Secondary | ICD-10-CM | POA: Insufficient documentation

## 2012-06-03 DIAGNOSIS — R0609 Other forms of dyspnea: Secondary | ICD-10-CM | POA: Insufficient documentation

## 2012-06-03 DIAGNOSIS — E663 Overweight: Secondary | ICD-10-CM | POA: Insufficient documentation

## 2012-06-03 NOTE — Progress Notes (Signed)
Patient oriented to the Cardiac Rehab Maintenance Program. Tolerated exercise well, c/o lightheadedness after the cool-down BP was 124/60, HR 62. Gatorade, peanut butter crackers and banana given. Pt asymptomatic at exit. Pt called after he returned home, and his blood sugar was 220.

## 2012-06-04 ENCOUNTER — Encounter (HOSPITAL_COMMUNITY)
Admission: RE | Admit: 2012-06-04 | Discharge: 2012-06-04 | Disposition: A | Discharge status: Home / Self Care | Payer: Self-pay | Source: Ambulatory Visit | Attending: Cardiovascular Disease | Admitting: Cardiovascular Disease

## 2012-06-05 ENCOUNTER — Encounter (HOSPITAL_COMMUNITY)
Admission: RE | Admit: 2012-06-05 | Discharge: 2012-06-05 | Disposition: A | Discharge status: Home / Self Care | Payer: Self-pay | Source: Ambulatory Visit | Attending: Cardiovascular Disease | Admitting: Cardiovascular Disease

## 2012-06-10 ENCOUNTER — Encounter (HOSPITAL_COMMUNITY)
Admission: RE | Admit: 2012-06-10 | Discharge: 2012-06-10 | Disposition: A | Discharge status: Home / Self Care | Payer: Self-pay | Source: Ambulatory Visit | Attending: Cardiovascular Disease | Admitting: Cardiovascular Disease

## 2012-06-11 ENCOUNTER — Encounter (HOSPITAL_COMMUNITY)
Admission: RE | Admit: 2012-06-11 | Discharge: 2012-06-11 | Disposition: A | Discharge status: Home / Self Care | Payer: Self-pay | Source: Ambulatory Visit | Attending: Cardiovascular Disease | Admitting: Cardiovascular Disease

## 2012-06-12 ENCOUNTER — Encounter (HOSPITAL_COMMUNITY): Payer: Self-pay

## 2012-06-17 ENCOUNTER — Encounter (HOSPITAL_COMMUNITY)
Admission: RE | Admit: 2012-06-17 | Discharge: 2012-06-17 | Disposition: A | Discharge status: Home / Self Care | Payer: Self-pay | Source: Ambulatory Visit | Attending: Cardiovascular Disease | Admitting: Cardiovascular Disease

## 2012-06-18 ENCOUNTER — Encounter (HOSPITAL_COMMUNITY)
Admission: RE | Admit: 2012-06-18 | Discharge: 2012-06-18 | Disposition: A | Discharge status: Home / Self Care | Payer: Self-pay | Source: Ambulatory Visit | Attending: Cardiovascular Disease | Admitting: Cardiovascular Disease

## 2012-06-19 ENCOUNTER — Encounter (HOSPITAL_COMMUNITY)
Admission: RE | Admit: 2012-06-19 | Discharge: 2012-06-19 | Disposition: A | Discharge status: Home / Self Care | Payer: Self-pay | Source: Ambulatory Visit | Attending: Cardiovascular Disease | Admitting: Cardiovascular Disease

## 2012-06-24 ENCOUNTER — Encounter (HOSPITAL_COMMUNITY)
Admission: RE | Admit: 2012-06-24 | Discharge: 2012-06-24 | Disposition: A | Discharge status: Home / Self Care | Payer: Self-pay | Source: Ambulatory Visit | Attending: Cardiovascular Disease | Admitting: Cardiovascular Disease

## 2012-06-25 ENCOUNTER — Encounter (HOSPITAL_COMMUNITY)
Admission: RE | Admit: 2012-06-25 | Discharge: 2012-06-25 | Disposition: A | Discharge status: Home / Self Care | Payer: Self-pay | Source: Ambulatory Visit | Attending: Cardiovascular Disease | Admitting: Cardiovascular Disease

## 2012-06-26 ENCOUNTER — Encounter (HOSPITAL_COMMUNITY)
Admission: RE | Admit: 2012-06-26 | Discharge: 2012-06-26 | Disposition: A | Discharge status: Home / Self Care | Payer: Self-pay | Source: Ambulatory Visit | Attending: Cardiovascular Disease | Admitting: Cardiovascular Disease

## 2012-07-01 ENCOUNTER — Encounter (HOSPITAL_COMMUNITY): Payer: Self-pay

## 2012-07-01 DIAGNOSIS — Z5189 Encounter for other specified aftercare: Secondary | ICD-10-CM | POA: Insufficient documentation

## 2012-07-01 DIAGNOSIS — R0989 Other specified symptoms and signs involving the circulatory and respiratory systems: Secondary | ICD-10-CM | POA: Insufficient documentation

## 2012-07-01 DIAGNOSIS — I119 Hypertensive heart disease without heart failure: Secondary | ICD-10-CM | POA: Insufficient documentation

## 2012-07-01 DIAGNOSIS — E663 Overweight: Secondary | ICD-10-CM | POA: Insufficient documentation

## 2012-07-01 DIAGNOSIS — E119 Type 2 diabetes mellitus without complications: Secondary | ICD-10-CM | POA: Insufficient documentation

## 2012-07-01 DIAGNOSIS — R0609 Other forms of dyspnea: Secondary | ICD-10-CM | POA: Insufficient documentation

## 2012-07-02 ENCOUNTER — Encounter (HOSPITAL_COMMUNITY)
Admission: RE | Admit: 2012-07-02 | Discharge: 2012-07-02 | Disposition: A | Discharge status: Home / Self Care | Payer: Self-pay | Source: Ambulatory Visit | Attending: Cardiovascular Disease | Admitting: Cardiovascular Disease

## 2012-07-03 ENCOUNTER — Encounter (HOSPITAL_COMMUNITY)
Admission: RE | Admit: 2012-07-03 | Discharge: 2012-07-03 | Disposition: A | Discharge status: Home / Self Care | Payer: Self-pay | Source: Ambulatory Visit | Attending: Cardiovascular Disease | Admitting: Cardiovascular Disease

## 2012-07-08 ENCOUNTER — Encounter (HOSPITAL_COMMUNITY)
Admission: RE | Admit: 2012-07-08 | Discharge: 2012-07-08 | Disposition: A | Discharge status: Home / Self Care | Payer: Self-pay | Source: Ambulatory Visit | Attending: Cardiovascular Disease | Admitting: Cardiovascular Disease

## 2012-07-09 ENCOUNTER — Encounter (HOSPITAL_COMMUNITY)
Admission: RE | Admit: 2012-07-09 | Discharge: 2012-07-09 | Disposition: A | Discharge status: Home / Self Care | Payer: Self-pay | Source: Ambulatory Visit | Attending: Cardiovascular Disease | Admitting: Cardiovascular Disease

## 2012-07-10 ENCOUNTER — Encounter (HOSPITAL_COMMUNITY)
Admission: RE | Admit: 2012-07-10 | Discharge: 2012-07-10 | Disposition: A | Discharge status: Home / Self Care | Payer: Self-pay | Source: Ambulatory Visit | Attending: Cardiovascular Disease | Admitting: Cardiovascular Disease

## 2012-07-15 ENCOUNTER — Encounter (HOSPITAL_COMMUNITY): Payer: Self-pay

## 2012-07-16 ENCOUNTER — Encounter (HOSPITAL_COMMUNITY)
Admission: RE | Admit: 2012-07-16 | Discharge: 2012-07-16 | Disposition: A | Discharge status: Home / Self Care | Payer: Self-pay | Source: Ambulatory Visit | Attending: Cardiovascular Disease | Admitting: Cardiovascular Disease

## 2012-07-16 NOTE — Progress Notes (Signed)
Reviewed home exercise guidelines with patient including endpoints, temperature precautions, target heart rate and rate of perceived exertion. Pt plans to walk and has a treadmill as his mode of home exercise. Pt voices understanding of instructions given.  Cristy Hilts, MS, ACSM CES

## 2012-07-17 ENCOUNTER — Encounter (HOSPITAL_COMMUNITY)
Admission: RE | Admit: 2012-07-17 | Discharge: 2012-07-17 | Disposition: A | Discharge status: Home / Self Care | Payer: Self-pay | Source: Ambulatory Visit | Attending: Cardiovascular Disease | Admitting: Cardiovascular Disease

## 2012-07-22 ENCOUNTER — Encounter (HOSPITAL_COMMUNITY)
Admission: RE | Admit: 2012-07-22 | Discharge: 2012-07-22 | Disposition: A | Discharge status: Home / Self Care | Payer: Self-pay | Source: Ambulatory Visit | Attending: Cardiovascular Disease | Admitting: Cardiovascular Disease

## 2012-07-23 ENCOUNTER — Encounter (HOSPITAL_COMMUNITY)
Admission: RE | Admit: 2012-07-23 | Discharge: 2012-07-23 | Disposition: A | Discharge status: Home / Self Care | Payer: Self-pay | Source: Ambulatory Visit | Attending: Cardiovascular Disease | Admitting: Cardiovascular Disease

## 2012-07-24 ENCOUNTER — Encounter (HOSPITAL_COMMUNITY)
Admission: RE | Admit: 2012-07-24 | Discharge: 2012-07-24 | Disposition: A | Discharge status: Home / Self Care | Payer: Self-pay | Source: Ambulatory Visit | Attending: Cardiovascular Disease | Admitting: Cardiovascular Disease

## 2012-07-29 ENCOUNTER — Encounter (HOSPITAL_COMMUNITY)
Admission: RE | Admit: 2012-07-29 | Discharge: 2012-07-29 | Disposition: A | Discharge status: Home / Self Care | Payer: Self-pay | Source: Ambulatory Visit | Attending: Cardiovascular Disease | Admitting: Cardiovascular Disease

## 2012-07-29 DIAGNOSIS — R0609 Other forms of dyspnea: Secondary | ICD-10-CM | POA: Insufficient documentation

## 2012-07-29 DIAGNOSIS — E119 Type 2 diabetes mellitus without complications: Secondary | ICD-10-CM | POA: Insufficient documentation

## 2012-07-29 DIAGNOSIS — R0989 Other specified symptoms and signs involving the circulatory and respiratory systems: Secondary | ICD-10-CM | POA: Insufficient documentation

## 2012-07-29 DIAGNOSIS — I119 Hypertensive heart disease without heart failure: Secondary | ICD-10-CM | POA: Insufficient documentation

## 2012-07-29 DIAGNOSIS — Z5189 Encounter for other specified aftercare: Secondary | ICD-10-CM | POA: Insufficient documentation

## 2012-07-29 DIAGNOSIS — E663 Overweight: Secondary | ICD-10-CM | POA: Insufficient documentation

## 2012-07-30 ENCOUNTER — Encounter (HOSPITAL_COMMUNITY)
Admission: RE | Admit: 2012-07-30 | Discharge: 2012-07-30 | Disposition: A | Discharge status: Home / Self Care | Payer: Self-pay | Source: Ambulatory Visit | Attending: Cardiovascular Disease | Admitting: Cardiovascular Disease

## 2012-07-31 ENCOUNTER — Encounter (HOSPITAL_COMMUNITY)
Admission: RE | Admit: 2012-07-31 | Discharge: 2012-07-31 | Disposition: A | Discharge status: Home / Self Care | Payer: Self-pay | Source: Ambulatory Visit | Attending: Cardiovascular Disease | Admitting: Cardiovascular Disease

## 2012-08-05 ENCOUNTER — Encounter (HOSPITAL_COMMUNITY)
Admission: RE | Admit: 2012-08-05 | Discharge: 2012-08-05 | Disposition: A | Discharge status: Home / Self Care | Payer: Self-pay | Source: Ambulatory Visit | Attending: Cardiovascular Disease | Admitting: Cardiovascular Disease

## 2012-08-06 ENCOUNTER — Encounter (HOSPITAL_COMMUNITY)
Admission: RE | Admit: 2012-08-06 | Discharge: 2012-08-06 | Disposition: A | Discharge status: Home / Self Care | Payer: Self-pay | Source: Ambulatory Visit | Attending: Cardiovascular Disease | Admitting: Cardiovascular Disease

## 2012-08-07 ENCOUNTER — Encounter (HOSPITAL_COMMUNITY)
Admission: RE | Admit: 2012-08-07 | Discharge: 2012-08-07 | Disposition: A | Discharge status: Home / Self Care | Payer: Self-pay | Source: Ambulatory Visit | Attending: Cardiovascular Disease | Admitting: Cardiovascular Disease

## 2012-08-12 ENCOUNTER — Encounter (HOSPITAL_COMMUNITY)
Admission: RE | Admit: 2012-08-12 | Discharge: 2012-08-12 | Disposition: A | Discharge status: Home / Self Care | Payer: Self-pay | Source: Ambulatory Visit | Attending: Cardiovascular Disease | Admitting: Cardiovascular Disease

## 2012-08-13 ENCOUNTER — Encounter (HOSPITAL_COMMUNITY)
Admission: RE | Admit: 2012-08-13 | Discharge: 2012-08-13 | Disposition: A | Discharge status: Home / Self Care | Payer: Self-pay | Source: Ambulatory Visit | Attending: Cardiovascular Disease | Admitting: Cardiovascular Disease

## 2012-08-14 ENCOUNTER — Encounter (HOSPITAL_COMMUNITY)
Admission: RE | Admit: 2012-08-14 | Discharge: 2012-08-14 | Disposition: A | Discharge status: Home / Self Care | Payer: Self-pay | Source: Ambulatory Visit | Attending: Cardiovascular Disease | Admitting: Cardiovascular Disease

## 2012-08-19 ENCOUNTER — Encounter (HOSPITAL_COMMUNITY)
Admission: RE | Admit: 2012-08-19 | Discharge: 2012-08-19 | Disposition: A | Discharge status: Home / Self Care | Payer: Self-pay | Source: Ambulatory Visit | Attending: Cardiovascular Disease | Admitting: Cardiovascular Disease

## 2012-08-20 ENCOUNTER — Encounter (HOSPITAL_COMMUNITY)
Admission: RE | Admit: 2012-08-20 | Discharge: 2012-08-20 | Disposition: A | Discharge status: Home / Self Care | Payer: Self-pay | Source: Ambulatory Visit | Attending: Cardiovascular Disease | Admitting: Cardiovascular Disease

## 2012-08-21 ENCOUNTER — Encounter (HOSPITAL_COMMUNITY)
Admission: RE | Admit: 2012-08-21 | Discharge: 2012-08-21 | Disposition: A | Discharge status: Home / Self Care | Payer: Self-pay | Source: Ambulatory Visit | Attending: Cardiovascular Disease | Admitting: Cardiovascular Disease

## 2012-08-22 ENCOUNTER — Encounter: Payer: Self-pay | Admitting: Cardiovascular Disease

## 2012-08-25 ENCOUNTER — Encounter (HOSPITAL_COMMUNITY)
Admission: RE | Admit: 2012-08-25 | Discharge: 2012-08-25 | Disposition: A | Discharge status: Home / Self Care | Payer: Self-pay | Source: Ambulatory Visit | Attending: Cardiovascular Disease | Admitting: Cardiovascular Disease

## 2012-08-26 ENCOUNTER — Encounter (HOSPITAL_COMMUNITY)
Admission: RE | Admit: 2012-08-26 | Discharge: 2012-08-26 | Disposition: A | Discharge status: Home / Self Care | Payer: Self-pay | Source: Ambulatory Visit | Attending: Cardiovascular Disease | Admitting: Cardiovascular Disease

## 2012-08-27 ENCOUNTER — Encounter (HOSPITAL_COMMUNITY): Payer: Self-pay

## 2012-08-28 ENCOUNTER — Encounter (HOSPITAL_COMMUNITY)
Admission: RE | Admit: 2012-08-28 | Discharge: 2012-08-28 | Disposition: A | Discharge status: Home / Self Care | Payer: Self-pay | Source: Ambulatory Visit | Attending: Cardiovascular Disease | Admitting: Cardiovascular Disease

## 2012-08-28 DIAGNOSIS — Z5189 Encounter for other specified aftercare: Secondary | ICD-10-CM | POA: Insufficient documentation

## 2012-08-28 DIAGNOSIS — R0989 Other specified symptoms and signs involving the circulatory and respiratory systems: Secondary | ICD-10-CM | POA: Insufficient documentation

## 2012-08-28 DIAGNOSIS — R0609 Other forms of dyspnea: Secondary | ICD-10-CM | POA: Insufficient documentation

## 2012-08-28 DIAGNOSIS — I119 Hypertensive heart disease without heart failure: Secondary | ICD-10-CM | POA: Insufficient documentation

## 2012-08-28 DIAGNOSIS — E663 Overweight: Secondary | ICD-10-CM | POA: Insufficient documentation

## 2012-08-28 DIAGNOSIS — E119 Type 2 diabetes mellitus without complications: Secondary | ICD-10-CM | POA: Insufficient documentation

## 2012-09-02 ENCOUNTER — Encounter (HOSPITAL_COMMUNITY)
Admission: RE | Admit: 2012-09-02 | Discharge: 2012-09-02 | Disposition: A | Discharge status: Home / Self Care | Payer: Self-pay | Source: Ambulatory Visit | Attending: Cardiovascular Disease | Admitting: Cardiovascular Disease

## 2012-09-03 ENCOUNTER — Encounter (HOSPITAL_COMMUNITY)
Admission: RE | Admit: 2012-09-03 | Discharge: 2012-09-03 | Disposition: A | Discharge status: Home / Self Care | Payer: Self-pay | Source: Ambulatory Visit | Attending: Cardiovascular Disease | Admitting: Cardiovascular Disease

## 2012-09-04 ENCOUNTER — Encounter (HOSPITAL_COMMUNITY)
Admission: RE | Admit: 2012-09-04 | Discharge: 2012-09-04 | Disposition: A | Discharge status: Home / Self Care | Payer: Self-pay | Source: Ambulatory Visit | Attending: Cardiovascular Disease | Admitting: Cardiovascular Disease

## 2012-09-09 ENCOUNTER — Encounter (HOSPITAL_COMMUNITY)
Admission: RE | Admit: 2012-09-09 | Discharge: 2012-09-09 | Disposition: A | Discharge status: Home / Self Care | Payer: Self-pay | Source: Ambulatory Visit | Attending: Cardiovascular Disease | Admitting: Cardiovascular Disease

## 2012-09-10 ENCOUNTER — Encounter (HOSPITAL_COMMUNITY)
Admission: RE | Admit: 2012-09-10 | Discharge: 2012-09-10 | Disposition: A | Discharge status: Home / Self Care | Payer: Self-pay | Source: Ambulatory Visit | Attending: Cardiovascular Disease | Admitting: Cardiovascular Disease

## 2012-09-11 ENCOUNTER — Encounter (HOSPITAL_COMMUNITY)
Admission: RE | Admit: 2012-09-11 | Discharge: 2012-09-11 | Disposition: A | Discharge status: Home / Self Care | Payer: Self-pay | Source: Ambulatory Visit | Attending: Cardiovascular Disease | Admitting: Cardiovascular Disease

## 2012-09-16 ENCOUNTER — Encounter (HOSPITAL_COMMUNITY): Payer: Self-pay

## 2012-09-17 ENCOUNTER — Encounter (HOSPITAL_COMMUNITY)
Admission: RE | Admit: 2012-09-17 | Discharge: 2012-09-17 | Disposition: A | Discharge status: Home / Self Care | Payer: Self-pay | Source: Ambulatory Visit | Attending: Cardiovascular Disease | Admitting: Cardiovascular Disease

## 2012-09-18 ENCOUNTER — Encounter (HOSPITAL_COMMUNITY)
Admission: RE | Admit: 2012-09-18 | Discharge: 2012-09-18 | Disposition: A | Discharge status: Home / Self Care | Payer: Self-pay | Source: Ambulatory Visit | Attending: Cardiovascular Disease | Admitting: Cardiovascular Disease

## 2012-09-19 ENCOUNTER — Encounter (HOSPITAL_COMMUNITY)
Admission: RE | Admit: 2012-09-19 | Discharge: 2012-09-19 | Disposition: A | Discharge status: Home / Self Care | Payer: Self-pay | Source: Ambulatory Visit | Attending: Cardiovascular Disease | Admitting: Cardiovascular Disease

## 2012-09-23 ENCOUNTER — Encounter (HOSPITAL_COMMUNITY): Payer: Self-pay

## 2012-09-24 ENCOUNTER — Encounter (HOSPITAL_COMMUNITY): Payer: Self-pay

## 2012-09-25 ENCOUNTER — Encounter (HOSPITAL_COMMUNITY): Payer: Self-pay

## 2012-09-30 ENCOUNTER — Encounter (HOSPITAL_COMMUNITY)
Admission: RE | Admit: 2012-09-30 | Discharge: 2012-09-30 | Disposition: A | Discharge status: Home / Self Care | Payer: Self-pay | Source: Ambulatory Visit | Attending: Cardiovascular Disease | Admitting: Cardiovascular Disease

## 2012-09-30 DIAGNOSIS — Z5189 Encounter for other specified aftercare: Secondary | ICD-10-CM | POA: Insufficient documentation

## 2012-09-30 DIAGNOSIS — R0609 Other forms of dyspnea: Secondary | ICD-10-CM | POA: Insufficient documentation

## 2012-09-30 DIAGNOSIS — R0989 Other specified symptoms and signs involving the circulatory and respiratory systems: Secondary | ICD-10-CM | POA: Insufficient documentation

## 2012-09-30 DIAGNOSIS — I119 Hypertensive heart disease without heart failure: Secondary | ICD-10-CM | POA: Insufficient documentation

## 2012-09-30 DIAGNOSIS — E119 Type 2 diabetes mellitus without complications: Secondary | ICD-10-CM | POA: Insufficient documentation

## 2012-09-30 DIAGNOSIS — E663 Overweight: Secondary | ICD-10-CM | POA: Insufficient documentation

## 2012-09-30 NOTE — Progress Notes (Signed)
Patient's blood pressure the last week in May was elevated to 166/88.  Patient realized he had not taken his lisinopril the  week it was elevated.  Is currently taking it and blood pressure is back to normal 140/80-122/76.

## 2012-10-01 ENCOUNTER — Encounter (HOSPITAL_COMMUNITY)
Admission: RE | Admit: 2012-10-01 | Discharge: 2012-10-01 | Disposition: A | Discharge status: Home / Self Care | Payer: Self-pay | Source: Ambulatory Visit | Attending: Cardiovascular Disease | Admitting: Cardiovascular Disease

## 2012-10-02 ENCOUNTER — Encounter (HOSPITAL_COMMUNITY)
Admission: RE | Admit: 2012-10-02 | Discharge: 2012-10-02 | Disposition: A | Discharge status: Home / Self Care | Payer: Self-pay | Source: Ambulatory Visit | Attending: Cardiovascular Disease | Admitting: Cardiovascular Disease

## 2012-10-06 ENCOUNTER — Encounter (HOSPITAL_COMMUNITY)
Admission: RE | Admit: 2012-10-06 | Discharge: 2012-10-06 | Disposition: A | Discharge status: Home / Self Care | Payer: Self-pay | Source: Ambulatory Visit | Attending: Cardiovascular Disease | Admitting: Cardiovascular Disease

## 2012-10-07 ENCOUNTER — Encounter (HOSPITAL_COMMUNITY)
Admission: RE | Admit: 2012-10-07 | Discharge: 2012-10-07 | Disposition: A | Discharge status: Home / Self Care | Payer: Self-pay | Source: Ambulatory Visit | Attending: Cardiovascular Disease | Admitting: Cardiovascular Disease

## 2012-10-08 ENCOUNTER — Encounter (HOSPITAL_COMMUNITY): Payer: Self-pay

## 2012-10-09 ENCOUNTER — Encounter (HOSPITAL_COMMUNITY): Payer: Self-pay

## 2012-10-14 ENCOUNTER — Encounter (HOSPITAL_COMMUNITY): Payer: Self-pay

## 2012-10-15 ENCOUNTER — Encounter (HOSPITAL_COMMUNITY)
Admission: RE | Admit: 2012-10-15 | Discharge: 2012-10-15 | Disposition: A | Discharge status: Home / Self Care | Payer: Self-pay | Source: Ambulatory Visit | Attending: Cardiovascular Disease | Admitting: Cardiovascular Disease

## 2012-10-16 ENCOUNTER — Encounter (HOSPITAL_COMMUNITY)
Admission: RE | Admit: 2012-10-16 | Discharge: 2012-10-16 | Disposition: A | Discharge status: Home / Self Care | Payer: Self-pay | Source: Ambulatory Visit | Attending: Cardiovascular Disease | Admitting: Cardiovascular Disease

## 2012-10-17 ENCOUNTER — Encounter (HOSPITAL_COMMUNITY)
Admission: RE | Admit: 2012-10-17 | Discharge: 2012-10-17 | Disposition: A | Discharge status: Home / Self Care | Payer: Self-pay | Source: Ambulatory Visit | Attending: Cardiovascular Disease | Admitting: Cardiovascular Disease

## 2012-10-21 ENCOUNTER — Encounter (HOSPITAL_COMMUNITY)
Admission: RE | Admit: 2012-10-21 | Discharge: 2012-10-21 | Disposition: A | Discharge status: Home / Self Care | Payer: Self-pay | Source: Ambulatory Visit | Attending: Cardiovascular Disease | Admitting: Cardiovascular Disease

## 2012-10-22 ENCOUNTER — Encounter (HOSPITAL_COMMUNITY): Payer: Self-pay

## 2012-10-23 ENCOUNTER — Encounter (HOSPITAL_COMMUNITY)
Admission: RE | Admit: 2012-10-23 | Discharge: 2012-10-23 | Disposition: A | Discharge status: Home / Self Care | Payer: Self-pay | Source: Ambulatory Visit | Attending: Cardiovascular Disease | Admitting: Cardiovascular Disease

## 2012-10-28 ENCOUNTER — Encounter (HOSPITAL_COMMUNITY)
Admission: RE | Admit: 2012-10-28 | Discharge: 2012-10-28 | Disposition: A | Discharge status: Home / Self Care | Payer: Self-pay | Source: Ambulatory Visit | Attending: Cardiovascular Disease | Admitting: Cardiovascular Disease

## 2012-10-28 DIAGNOSIS — R0989 Other specified symptoms and signs involving the circulatory and respiratory systems: Secondary | ICD-10-CM | POA: Insufficient documentation

## 2012-10-28 DIAGNOSIS — R0609 Other forms of dyspnea: Secondary | ICD-10-CM | POA: Insufficient documentation

## 2012-10-28 DIAGNOSIS — E119 Type 2 diabetes mellitus without complications: Secondary | ICD-10-CM | POA: Insufficient documentation

## 2012-10-28 DIAGNOSIS — I119 Hypertensive heart disease without heart failure: Secondary | ICD-10-CM | POA: Insufficient documentation

## 2012-10-28 DIAGNOSIS — E663 Overweight: Secondary | ICD-10-CM | POA: Insufficient documentation

## 2012-10-28 DIAGNOSIS — Z5189 Encounter for other specified aftercare: Secondary | ICD-10-CM | POA: Insufficient documentation

## 2012-10-29 ENCOUNTER — Encounter (HOSPITAL_COMMUNITY)
Admission: RE | Admit: 2012-10-29 | Discharge: 2012-10-29 | Disposition: A | Discharge status: Home / Self Care | Payer: Self-pay | Source: Ambulatory Visit | Attending: Cardiovascular Disease | Admitting: Cardiovascular Disease

## 2012-10-30 ENCOUNTER — Encounter (HOSPITAL_COMMUNITY)
Admission: RE | Admit: 2012-10-30 | Discharge: 2012-10-30 | Disposition: A | Discharge status: Home / Self Care | Payer: Self-pay | Source: Ambulatory Visit | Attending: Cardiovascular Disease | Admitting: Cardiovascular Disease

## 2012-11-04 ENCOUNTER — Encounter (HOSPITAL_COMMUNITY)
Admission: RE | Admit: 2012-11-04 | Discharge: 2012-11-04 | Disposition: A | Discharge status: Home / Self Care | Payer: Self-pay | Source: Ambulatory Visit | Attending: Cardiovascular Disease | Admitting: Cardiovascular Disease

## 2012-11-05 ENCOUNTER — Encounter (HOSPITAL_COMMUNITY)
Admission: RE | Admit: 2012-11-05 | Discharge: 2012-11-05 | Disposition: A | Discharge status: Home / Self Care | Payer: Self-pay | Source: Ambulatory Visit | Attending: Cardiovascular Disease | Admitting: Cardiovascular Disease

## 2012-11-06 ENCOUNTER — Encounter (HOSPITAL_COMMUNITY)
Admission: RE | Admit: 2012-11-06 | Discharge: 2012-11-06 | Disposition: A | Discharge status: Home / Self Care | Payer: Self-pay | Source: Ambulatory Visit | Attending: Cardiovascular Disease | Admitting: Cardiovascular Disease

## 2012-11-11 ENCOUNTER — Encounter (HOSPITAL_COMMUNITY)
Admission: RE | Admit: 2012-11-11 | Discharge: 2012-11-11 | Disposition: A | Discharge status: Home / Self Care | Payer: Self-pay | Source: Ambulatory Visit | Attending: Cardiovascular Disease | Admitting: Cardiovascular Disease

## 2012-11-12 ENCOUNTER — Encounter (HOSPITAL_COMMUNITY)
Admission: RE | Admit: 2012-11-12 | Discharge: 2012-11-12 | Disposition: A | Discharge status: Home / Self Care | Payer: Self-pay | Source: Ambulatory Visit | Attending: Cardiovascular Disease | Admitting: Cardiovascular Disease

## 2012-11-13 ENCOUNTER — Encounter (HOSPITAL_COMMUNITY)
Admission: RE | Admit: 2012-11-13 | Discharge: 2012-11-13 | Disposition: A | Discharge status: Home / Self Care | Payer: Self-pay | Source: Ambulatory Visit | Attending: Cardiovascular Disease | Admitting: Cardiovascular Disease

## 2012-11-13 NOTE — Progress Notes (Signed)
Tony [name] has systolic blood pressure readings in the 90's the last two exercise sessions at cardiac rehab maintenance. Weight stable. Patient asymptomatic. Post exercise blood pressure 94/60.  Patient given gatorade. Will fax exercise flow sheets to Dr. Landry Dyke office for review.

## 2012-11-18 ENCOUNTER — Encounter (HOSPITAL_COMMUNITY)
Admission: RE | Admit: 2012-11-18 | Discharge: 2012-11-18 | Disposition: A | Discharge status: Home / Self Care | Payer: Self-pay | Source: Ambulatory Visit | Attending: Cardiovascular Disease | Admitting: Cardiovascular Disease

## 2012-11-19 ENCOUNTER — Encounter (HOSPITAL_COMMUNITY): Payer: Self-pay

## 2012-11-20 ENCOUNTER — Encounter (HOSPITAL_COMMUNITY)
Admission: RE | Admit: 2012-11-20 | Discharge: 2012-11-20 | Disposition: A | Discharge status: Home / Self Care | Payer: Self-pay | Source: Ambulatory Visit | Attending: Cardiovascular Disease | Admitting: Cardiovascular Disease

## 2012-11-21 ENCOUNTER — Encounter (HOSPITAL_COMMUNITY)
Admission: RE | Admit: 2012-11-21 | Discharge: 2012-11-21 | Disposition: A | Discharge status: Home / Self Care | Payer: Self-pay | Source: Ambulatory Visit | Attending: Cardiovascular Disease | Admitting: Cardiovascular Disease

## 2012-11-25 ENCOUNTER — Encounter (HOSPITAL_COMMUNITY): Payer: Self-pay

## 2012-11-26 ENCOUNTER — Encounter (HOSPITAL_COMMUNITY)
Admission: RE | Admit: 2012-11-26 | Discharge: 2012-11-26 | Disposition: A | Discharge status: Home / Self Care | Payer: Self-pay | Source: Ambulatory Visit | Attending: Cardiovascular Disease | Admitting: Cardiovascular Disease

## 2012-11-27 ENCOUNTER — Encounter (HOSPITAL_COMMUNITY)
Admission: RE | Admit: 2012-11-27 | Discharge: 2012-11-27 | Disposition: A | Discharge status: Home / Self Care | Payer: Self-pay | Source: Ambulatory Visit | Attending: Cardiovascular Disease | Admitting: Cardiovascular Disease

## 2012-11-28 ENCOUNTER — Encounter (HOSPITAL_COMMUNITY)
Admission: RE | Admit: 2012-11-28 | Discharge: 2012-11-28 | Disposition: A | Discharge status: Home / Self Care | Payer: Self-pay | Source: Ambulatory Visit | Attending: Cardiovascular Disease | Admitting: Cardiovascular Disease

## 2012-11-28 ENCOUNTER — Other Ambulatory Visit: Payer: Self-pay | Admitting: *Deleted

## 2012-11-28 DIAGNOSIS — R0989 Other specified symptoms and signs involving the circulatory and respiratory systems: Secondary | ICD-10-CM | POA: Insufficient documentation

## 2012-11-28 DIAGNOSIS — R0609 Other forms of dyspnea: Secondary | ICD-10-CM | POA: Insufficient documentation

## 2012-11-28 DIAGNOSIS — I119 Hypertensive heart disease without heart failure: Secondary | ICD-10-CM | POA: Insufficient documentation

## 2012-11-28 DIAGNOSIS — E119 Type 2 diabetes mellitus without complications: Secondary | ICD-10-CM | POA: Insufficient documentation

## 2012-11-28 DIAGNOSIS — E663 Overweight: Secondary | ICD-10-CM | POA: Insufficient documentation

## 2012-11-28 DIAGNOSIS — Z5189 Encounter for other specified aftercare: Secondary | ICD-10-CM | POA: Insufficient documentation

## 2012-11-28 MED ORDER — ATORVASTATIN CALCIUM 40 MG PO TABS: 40.0000 mg | ORAL_TABLET | Freq: Every day | ORAL | Status: DC

## 2012-12-02 ENCOUNTER — Encounter (HOSPITAL_COMMUNITY): Payer: Self-pay

## 2012-12-03 ENCOUNTER — Encounter (HOSPITAL_COMMUNITY)
Admission: RE | Admit: 2012-12-03 | Discharge: 2012-12-03 | Disposition: A | Discharge status: Home / Self Care | Payer: Self-pay | Source: Ambulatory Visit | Attending: Cardiovascular Disease | Admitting: Cardiovascular Disease

## 2012-12-04 ENCOUNTER — Encounter (HOSPITAL_COMMUNITY)
Admission: RE | Admit: 2012-12-04 | Discharge: 2012-12-04 | Disposition: A | Discharge status: Home / Self Care | Payer: Self-pay | Source: Ambulatory Visit | Attending: Cardiovascular Disease | Admitting: Cardiovascular Disease

## 2012-12-05 ENCOUNTER — Encounter (HOSPITAL_COMMUNITY)
Admission: RE | Admit: 2012-12-05 | Discharge: 2012-12-05 | Disposition: A | Discharge status: Home / Self Care | Payer: Self-pay | Source: Ambulatory Visit | Attending: Cardiovascular Disease | Admitting: Cardiovascular Disease

## 2012-12-09 ENCOUNTER — Encounter (HOSPITAL_COMMUNITY): Payer: Self-pay

## 2012-12-10 ENCOUNTER — Encounter (HOSPITAL_COMMUNITY)
Admission: RE | Admit: 2012-12-10 | Discharge: 2012-12-10 | Disposition: A | Discharge status: Home / Self Care | Payer: Self-pay | Source: Ambulatory Visit | Attending: Cardiovascular Disease | Admitting: Cardiovascular Disease

## 2012-12-11 ENCOUNTER — Encounter (HOSPITAL_COMMUNITY)
Admission: RE | Admit: 2012-12-11 | Discharge: 2012-12-11 | Disposition: A | Discharge status: Home / Self Care | Payer: Self-pay | Source: Ambulatory Visit | Attending: Cardiovascular Disease | Admitting: Cardiovascular Disease

## 2012-12-16 ENCOUNTER — Encounter (HOSPITAL_COMMUNITY)
Admission: RE | Admit: 2012-12-16 | Discharge: 2012-12-16 | Disposition: A | Discharge status: Home / Self Care | Payer: Self-pay | Source: Ambulatory Visit | Attending: Cardiovascular Disease | Admitting: Cardiovascular Disease

## 2012-12-17 ENCOUNTER — Encounter (HOSPITAL_COMMUNITY)
Admission: RE | Admit: 2012-12-17 | Discharge: 2012-12-17 | Disposition: A | Discharge status: Home / Self Care | Payer: Self-pay | Source: Ambulatory Visit | Attending: Cardiovascular Disease | Admitting: Cardiovascular Disease

## 2012-12-18 ENCOUNTER — Encounter (HOSPITAL_COMMUNITY)
Admission: RE | Admit: 2012-12-18 | Discharge: 2012-12-18 | Disposition: A | Discharge status: Home / Self Care | Payer: Self-pay | Source: Ambulatory Visit | Attending: Cardiovascular Disease | Admitting: Cardiovascular Disease

## 2012-12-23 ENCOUNTER — Encounter (HOSPITAL_COMMUNITY): Payer: Self-pay

## 2012-12-24 ENCOUNTER — Encounter (HOSPITAL_COMMUNITY)
Admission: RE | Admit: 2012-12-24 | Discharge: 2012-12-24 | Disposition: A | Discharge status: Home / Self Care | Payer: Self-pay | Source: Ambulatory Visit | Attending: Cardiovascular Disease | Admitting: Cardiovascular Disease

## 2012-12-25 ENCOUNTER — Encounter (HOSPITAL_COMMUNITY)
Admission: RE | Admit: 2012-12-25 | Discharge: 2012-12-25 | Disposition: A | Discharge status: Home / Self Care | Payer: Self-pay | Source: Ambulatory Visit | Attending: Cardiovascular Disease | Admitting: Cardiovascular Disease

## 2012-12-26 ENCOUNTER — Telehealth: Payer: Self-pay | Admitting: Cardiovascular Disease

## 2012-12-26 ENCOUNTER — Encounter (HOSPITAL_COMMUNITY)
Admission: RE | Admit: 2012-12-26 | Discharge: 2012-12-26 | Disposition: A | Discharge status: Home / Self Care | Payer: Self-pay | Source: Ambulatory Visit | Attending: Cardiovascular Disease | Admitting: Cardiovascular Disease

## 2012-12-26 NOTE — Telephone Encounter (Signed)
Requested  3 weeks ago to see if he could lift weights in [facility]-A fax was sent over-never gotten it backe

## 2012-12-30 ENCOUNTER — Encounter (HOSPITAL_COMMUNITY)
Admission: RE | Admit: 2012-12-30 | Discharge: 2012-12-30 | Disposition: A | Discharge status: Home / Self Care | Payer: Self-pay | Source: Ambulatory Visit | Attending: Cardiovascular Disease | Admitting: Cardiovascular Disease

## 2012-12-30 DIAGNOSIS — E119 Type 2 diabetes mellitus without complications: Secondary | ICD-10-CM | POA: Insufficient documentation

## 2012-12-30 DIAGNOSIS — R0989 Other specified symptoms and signs involving the circulatory and respiratory systems: Secondary | ICD-10-CM | POA: Insufficient documentation

## 2012-12-30 DIAGNOSIS — E663 Overweight: Secondary | ICD-10-CM | POA: Insufficient documentation

## 2012-12-30 DIAGNOSIS — Z5189 Encounter for other specified aftercare: Secondary | ICD-10-CM | POA: Insufficient documentation

## 2012-12-30 DIAGNOSIS — I119 Hypertensive heart disease without heart failure: Secondary | ICD-10-CM | POA: Insufficient documentation

## 2012-12-30 DIAGNOSIS — R0609 Other forms of dyspnea: Secondary | ICD-10-CM | POA: Insufficient documentation

## 2012-12-31 ENCOUNTER — Encounter (HOSPITAL_COMMUNITY)
Admission: RE | Admit: 2012-12-31 | Discharge: 2012-12-31 | Disposition: A | Discharge status: Home / Self Care | Payer: Self-pay | Source: Ambulatory Visit | Attending: Cardiovascular Disease | Admitting: Cardiovascular Disease

## 2013-01-01 ENCOUNTER — Encounter (HOSPITAL_COMMUNITY)
Admission: RE | Admit: 2013-01-01 | Discharge: 2013-01-01 | Disposition: A | Discharge status: Home / Self Care | Payer: Self-pay | Source: Ambulatory Visit | Attending: Cardiovascular Disease | Admitting: Cardiovascular Disease

## 2013-01-06 ENCOUNTER — Encounter (HOSPITAL_COMMUNITY)
Admission: RE | Admit: 2013-01-06 | Discharge: 2013-01-06 | Disposition: A | Discharge status: Home / Self Care | Payer: Self-pay | Source: Ambulatory Visit | Attending: Cardiovascular Disease | Admitting: Cardiovascular Disease

## 2013-01-07 ENCOUNTER — Encounter (HOSPITAL_COMMUNITY)
Admission: RE | Admit: 2013-01-07 | Discharge: 2013-01-07 | Disposition: A | Discharge status: Home / Self Care | Payer: Self-pay | Source: Ambulatory Visit | Attending: Cardiovascular Disease | Admitting: Cardiovascular Disease

## 2013-01-08 ENCOUNTER — Encounter (HOSPITAL_COMMUNITY)
Admission: RE | Admit: 2013-01-08 | Discharge: 2013-01-08 | Disposition: A | Discharge status: Home / Self Care | Payer: Self-pay | Source: Ambulatory Visit | Attending: Cardiovascular Disease | Admitting: Cardiovascular Disease

## 2013-01-13 ENCOUNTER — Encounter (HOSPITAL_COMMUNITY): Payer: Self-pay

## 2013-01-14 ENCOUNTER — Encounter (HOSPITAL_COMMUNITY)
Admission: RE | Admit: 2013-01-14 | Discharge: 2013-01-14 | Disposition: A | Discharge status: Home / Self Care | Payer: Self-pay | Source: Ambulatory Visit | Attending: Cardiovascular Disease | Admitting: Cardiovascular Disease

## 2013-01-15 ENCOUNTER — Encounter (HOSPITAL_COMMUNITY)
Admission: RE | Admit: 2013-01-15 | Discharge: 2013-01-15 | Disposition: A | Discharge status: Home / Self Care | Payer: Self-pay | Source: Ambulatory Visit | Attending: Cardiovascular Disease | Admitting: Cardiovascular Disease

## 2013-01-16 ENCOUNTER — Encounter (HOSPITAL_COMMUNITY)
Admission: RE | Admit: 2013-01-16 | Discharge: 2013-01-16 | Disposition: A | Discharge status: Home / Self Care | Payer: Self-pay | Source: Ambulatory Visit | Attending: Cardiovascular Disease | Admitting: Cardiovascular Disease

## 2013-01-18 ENCOUNTER — Telehealth: Payer: Self-pay | Admitting: *Deleted

## 2013-01-18 NOTE — Telephone Encounter (Signed)
Faxed cardiac rehab order back. 

## 2013-01-20 ENCOUNTER — Encounter (HOSPITAL_COMMUNITY)
Admission: RE | Admit: 2013-01-20 | Discharge: 2013-01-20 | Disposition: A | Discharge status: Home / Self Care | Payer: Self-pay | Source: Ambulatory Visit | Attending: Cardiovascular Disease | Admitting: Cardiovascular Disease

## 2013-01-21 ENCOUNTER — Encounter (HOSPITAL_COMMUNITY): Payer: Self-pay

## 2013-01-22 ENCOUNTER — Encounter (HOSPITAL_COMMUNITY)
Admission: RE | Admit: 2013-01-22 | Discharge: 2013-01-22 | Disposition: A | Discharge status: Home / Self Care | Payer: Self-pay | Source: Ambulatory Visit | Attending: Cardiovascular Disease | Admitting: Cardiovascular Disease

## 2013-01-27 ENCOUNTER — Encounter (HOSPITAL_COMMUNITY): Payer: Self-pay

## 2013-01-28 ENCOUNTER — Encounter (HOSPITAL_COMMUNITY)
Admission: RE | Admit: 2013-01-28 | Discharge: 2013-01-28 | Disposition: A | Discharge status: Home / Self Care | Payer: Self-pay | Source: Ambulatory Visit | Attending: Cardiovascular Disease | Admitting: Cardiovascular Disease

## 2013-01-28 DIAGNOSIS — R0989 Other specified symptoms and signs involving the circulatory and respiratory systems: Secondary | ICD-10-CM | POA: Insufficient documentation

## 2013-01-28 DIAGNOSIS — Z5189 Encounter for other specified aftercare: Secondary | ICD-10-CM | POA: Insufficient documentation

## 2013-01-28 DIAGNOSIS — E119 Type 2 diabetes mellitus without complications: Secondary | ICD-10-CM | POA: Insufficient documentation

## 2013-01-28 DIAGNOSIS — I119 Hypertensive heart disease without heart failure: Secondary | ICD-10-CM | POA: Insufficient documentation

## 2013-01-28 DIAGNOSIS — E663 Overweight: Secondary | ICD-10-CM | POA: Insufficient documentation

## 2013-01-28 DIAGNOSIS — R0609 Other forms of dyspnea: Secondary | ICD-10-CM | POA: Insufficient documentation

## 2013-01-29 ENCOUNTER — Encounter (HOSPITAL_COMMUNITY)
Admission: RE | Admit: 2013-01-29 | Discharge: 2013-01-29 | Disposition: A | Discharge status: Home / Self Care | Payer: Self-pay | Source: Ambulatory Visit | Attending: Cardiovascular Disease | Admitting: Cardiovascular Disease

## 2013-01-30 ENCOUNTER — Encounter (HOSPITAL_COMMUNITY)
Admission: RE | Admit: 2013-01-30 | Discharge: 2013-01-30 | Disposition: A | Discharge status: Home / Self Care | Payer: Self-pay | Source: Ambulatory Visit | Attending: Cardiovascular Disease | Admitting: Cardiovascular Disease

## 2013-02-03 ENCOUNTER — Encounter (HOSPITAL_COMMUNITY)
Admission: RE | Admit: 2013-02-03 | Discharge: 2013-02-03 | Disposition: A | Discharge status: Home / Self Care | Payer: Self-pay | Source: Ambulatory Visit | Attending: Cardiovascular Disease | Admitting: Cardiovascular Disease

## 2013-02-04 ENCOUNTER — Encounter (HOSPITAL_COMMUNITY)
Admission: RE | Admit: 2013-02-04 | Discharge: 2013-02-04 | Disposition: A | Discharge status: Home / Self Care | Payer: Self-pay | Source: Ambulatory Visit | Attending: Cardiovascular Disease | Admitting: Cardiovascular Disease

## 2013-02-05 ENCOUNTER — Encounter (HOSPITAL_COMMUNITY): Payer: Self-pay

## 2013-02-06 ENCOUNTER — Encounter (HOSPITAL_COMMUNITY)
Admission: RE | Admit: 2013-02-06 | Discharge: 2013-02-06 | Disposition: A | Discharge status: Home / Self Care | Payer: Self-pay | Source: Ambulatory Visit | Attending: Cardiovascular Disease | Admitting: Cardiovascular Disease

## 2013-02-10 ENCOUNTER — Encounter (HOSPITAL_COMMUNITY)
Admission: RE | Admit: 2013-02-10 | Discharge: 2013-02-10 | Disposition: A | Discharge status: Home / Self Care | Payer: Self-pay | Source: Ambulatory Visit | Attending: Cardiovascular Disease | Admitting: Cardiovascular Disease

## 2013-02-11 ENCOUNTER — Encounter (HOSPITAL_COMMUNITY): Payer: Self-pay

## 2013-02-12 ENCOUNTER — Encounter (HOSPITAL_COMMUNITY)
Admission: RE | Admit: 2013-02-12 | Discharge: 2013-02-12 | Disposition: A | Discharge status: Home / Self Care | Payer: Self-pay | Source: Ambulatory Visit | Attending: Cardiovascular Disease | Admitting: Cardiovascular Disease

## 2013-02-17 ENCOUNTER — Encounter (HOSPITAL_COMMUNITY)
Admission: RE | Admit: 2013-02-17 | Discharge: 2013-02-17 | Disposition: A | Discharge status: Home / Self Care | Payer: Self-pay | Source: Ambulatory Visit | Attending: Cardiovascular Disease | Admitting: Cardiovascular Disease

## 2013-02-18 ENCOUNTER — Encounter (HOSPITAL_COMMUNITY): Payer: Self-pay

## 2013-02-19 ENCOUNTER — Encounter (HOSPITAL_COMMUNITY)
Admission: RE | Admit: 2013-02-19 | Discharge: 2013-02-19 | Disposition: A | Discharge status: Home / Self Care | Payer: Self-pay | Source: Ambulatory Visit | Attending: Cardiovascular Disease | Admitting: Cardiovascular Disease

## 2013-02-20 ENCOUNTER — Encounter (HOSPITAL_COMMUNITY)
Admission: RE | Admit: 2013-02-20 | Discharge: 2013-02-20 | Disposition: A | Discharge status: Home / Self Care | Payer: Self-pay | Source: Ambulatory Visit | Attending: Cardiovascular Disease | Admitting: Cardiovascular Disease

## 2013-02-24 ENCOUNTER — Encounter (HOSPITAL_COMMUNITY): Payer: Self-pay

## 2013-02-25 ENCOUNTER — Encounter (HOSPITAL_COMMUNITY): Payer: Self-pay

## 2013-02-26 ENCOUNTER — Encounter (HOSPITAL_COMMUNITY): Payer: Self-pay

## 2013-03-03 ENCOUNTER — Encounter (HOSPITAL_COMMUNITY)
Admission: RE | Admit: 2013-03-03 | Discharge: 2013-03-03 | Disposition: A | Discharge status: Home / Self Care | Payer: BC Managed Care – PPO | Source: Ambulatory Visit | Attending: Cardiovascular Disease | Admitting: Cardiovascular Disease

## 2013-03-03 DIAGNOSIS — E119 Type 2 diabetes mellitus without complications: Secondary | ICD-10-CM | POA: Insufficient documentation

## 2013-03-03 DIAGNOSIS — R0609 Other forms of dyspnea: Secondary | ICD-10-CM | POA: Insufficient documentation

## 2013-03-03 DIAGNOSIS — I119 Hypertensive heart disease without heart failure: Secondary | ICD-10-CM | POA: Insufficient documentation

## 2013-03-03 DIAGNOSIS — E663 Overweight: Secondary | ICD-10-CM | POA: Insufficient documentation

## 2013-03-03 DIAGNOSIS — R0989 Other specified symptoms and signs involving the circulatory and respiratory systems: Secondary | ICD-10-CM | POA: Insufficient documentation

## 2013-03-03 DIAGNOSIS — Z5189 Encounter for other specified aftercare: Secondary | ICD-10-CM | POA: Insufficient documentation

## 2013-03-04 ENCOUNTER — Other Ambulatory Visit: Payer: Self-pay | Admitting: *Deleted

## 2013-03-04 ENCOUNTER — Encounter (HOSPITAL_COMMUNITY)
Admission: RE | Admit: 2013-03-04 | Discharge: 2013-03-04 | Disposition: A | Discharge status: Home / Self Care | Payer: BC Managed Care – PPO | Source: Ambulatory Visit | Attending: Cardiovascular Disease | Admitting: Cardiovascular Disease

## 2013-03-04 MED ORDER — METOPROLOL SUCCINATE ER 25 MG PO TB24: 25.0000 mg | ORAL_TABLET | Freq: Every day | ORAL | Status: DC

## 2013-03-04 NOTE — Telephone Encounter (Signed)
Rx was sent to pharmacy electronically. 

## 2013-03-05 ENCOUNTER — Encounter (HOSPITAL_COMMUNITY)
Admission: RE | Admit: 2013-03-05 | Discharge: 2013-03-05 | Disposition: A | Discharge status: Home / Self Care | Payer: BC Managed Care – PPO | Source: Ambulatory Visit | Attending: Cardiovascular Disease | Admitting: Cardiovascular Disease

## 2013-03-10 ENCOUNTER — Encounter (HOSPITAL_COMMUNITY): Payer: BC Managed Care – PPO

## 2013-03-11 ENCOUNTER — Encounter (HOSPITAL_COMMUNITY): Payer: BC Managed Care – PPO

## 2013-03-12 ENCOUNTER — Encounter (HOSPITAL_COMMUNITY): Payer: BC Managed Care – PPO

## 2013-03-13 ENCOUNTER — Encounter (HOSPITAL_COMMUNITY)
Admission: RE | Admit: 2013-03-13 | Discharge: 2013-03-13 | Disposition: A | Discharge status: Home / Self Care | Payer: Self-pay | Source: Ambulatory Visit | Attending: Cardiovascular Disease | Admitting: Cardiovascular Disease

## 2013-03-17 ENCOUNTER — Encounter (HOSPITAL_COMMUNITY)
Admission: RE | Admit: 2013-03-17 | Discharge: 2013-03-17 | Disposition: A | Discharge status: Home / Self Care | Payer: Self-pay | Source: Ambulatory Visit | Attending: Cardiovascular Disease | Admitting: Cardiovascular Disease

## 2013-03-18 ENCOUNTER — Encounter (HOSPITAL_COMMUNITY): Payer: BC Managed Care – PPO

## 2013-03-19 ENCOUNTER — Encounter (HOSPITAL_COMMUNITY)
Admission: RE | Admit: 2013-03-19 | Discharge: 2013-03-19 | Disposition: A | Discharge status: Home / Self Care | Payer: Self-pay | Source: Ambulatory Visit | Attending: Cardiovascular Disease | Admitting: Cardiovascular Disease

## 2013-03-24 ENCOUNTER — Encounter (HOSPITAL_COMMUNITY)
Admission: RE | Admit: 2013-03-24 | Discharge: 2013-03-24 | Disposition: A | Discharge status: Home / Self Care | Payer: Self-pay | Source: Ambulatory Visit | Attending: Cardiovascular Disease | Admitting: Cardiovascular Disease

## 2013-03-25 ENCOUNTER — Encounter (HOSPITAL_COMMUNITY): Payer: BC Managed Care – PPO

## 2013-03-31 ENCOUNTER — Encounter (HOSPITAL_COMMUNITY): Payer: Self-pay

## 2013-03-31 DIAGNOSIS — E663 Overweight: Secondary | ICD-10-CM | POA: Insufficient documentation

## 2013-03-31 DIAGNOSIS — E119 Type 2 diabetes mellitus without complications: Secondary | ICD-10-CM | POA: Insufficient documentation

## 2013-03-31 DIAGNOSIS — I119 Hypertensive heart disease without heart failure: Secondary | ICD-10-CM | POA: Insufficient documentation

## 2013-03-31 DIAGNOSIS — R0609 Other forms of dyspnea: Secondary | ICD-10-CM | POA: Insufficient documentation

## 2013-03-31 DIAGNOSIS — Z5189 Encounter for other specified aftercare: Secondary | ICD-10-CM | POA: Insufficient documentation

## 2013-03-31 DIAGNOSIS — R0989 Other specified symptoms and signs involving the circulatory and respiratory systems: Secondary | ICD-10-CM | POA: Insufficient documentation

## 2013-04-01 ENCOUNTER — Encounter (HOSPITAL_COMMUNITY)
Admission: RE | Admit: 2013-04-01 | Discharge: 2013-04-01 | Disposition: A | Discharge status: Home / Self Care | Payer: Self-pay | Source: Ambulatory Visit | Attending: Cardiovascular Disease | Admitting: Cardiovascular Disease

## 2013-04-02 ENCOUNTER — Encounter (HOSPITAL_COMMUNITY)
Admission: RE | Admit: 2013-04-02 | Discharge: 2013-04-02 | Disposition: A | Discharge status: Home / Self Care | Payer: Self-pay | Source: Ambulatory Visit | Attending: Cardiovascular Disease | Admitting: Cardiovascular Disease

## 2013-04-03 ENCOUNTER — Encounter (HOSPITAL_COMMUNITY)
Admission: RE | Admit: 2013-04-03 | Discharge: 2013-04-03 | Disposition: A | Discharge status: Home / Self Care | Payer: Self-pay | Source: Ambulatory Visit | Attending: Cardiovascular Disease | Admitting: Cardiovascular Disease

## 2013-04-07 ENCOUNTER — Encounter (HOSPITAL_COMMUNITY)
Admission: RE | Admit: 2013-04-07 | Discharge: 2013-04-07 | Disposition: A | Discharge status: Home / Self Care | Payer: Self-pay | Source: Ambulatory Visit | Attending: Cardiovascular Disease | Admitting: Cardiovascular Disease

## 2013-04-08 ENCOUNTER — Encounter (HOSPITAL_COMMUNITY)
Admission: RE | Admit: 2013-04-08 | Discharge: 2013-04-08 | Disposition: A | Discharge status: Home / Self Care | Payer: Self-pay | Source: Ambulatory Visit | Attending: Cardiovascular Disease | Admitting: Cardiovascular Disease

## 2013-04-09 ENCOUNTER — Encounter (HOSPITAL_COMMUNITY): Payer: Self-pay

## 2013-04-10 ENCOUNTER — Encounter (HOSPITAL_COMMUNITY)
Admission: RE | Admit: 2013-04-10 | Discharge: 2013-04-10 | Disposition: A | Discharge status: Home / Self Care | Payer: Self-pay | Source: Ambulatory Visit | Attending: Cardiovascular Disease | Admitting: Cardiovascular Disease

## 2013-04-14 ENCOUNTER — Encounter (HOSPITAL_COMMUNITY)
Admission: RE | Admit: 2013-04-14 | Discharge: 2013-04-14 | Disposition: A | Discharge status: Home / Self Care | Payer: Self-pay | Source: Ambulatory Visit | Attending: Cardiovascular Disease | Admitting: Cardiovascular Disease

## 2013-04-15 ENCOUNTER — Encounter (HOSPITAL_COMMUNITY): Payer: Self-pay

## 2013-04-16 ENCOUNTER — Encounter (HOSPITAL_COMMUNITY)
Admission: RE | Admit: 2013-04-16 | Discharge: 2013-04-16 | Disposition: A | Discharge status: Home / Self Care | Payer: Self-pay | Source: Ambulatory Visit | Attending: Cardiovascular Disease | Admitting: Cardiovascular Disease

## 2013-04-17 ENCOUNTER — Encounter (HOSPITAL_COMMUNITY)
Admission: RE | Admit: 2013-04-17 | Discharge: 2013-04-17 | Disposition: A | Discharge status: Home / Self Care | Payer: Self-pay | Source: Ambulatory Visit | Attending: Cardiovascular Disease | Admitting: Cardiovascular Disease

## 2013-04-21 ENCOUNTER — Encounter (HOSPITAL_COMMUNITY)
Admission: RE | Admit: 2013-04-21 | Discharge: 2013-04-21 | Disposition: A | Discharge status: Home / Self Care | Payer: Self-pay | Source: Ambulatory Visit | Attending: Cardiovascular Disease | Admitting: Cardiovascular Disease

## 2013-04-22 ENCOUNTER — Encounter (HOSPITAL_COMMUNITY)
Admission: RE | Admit: 2013-04-22 | Discharge: 2013-04-22 | Disposition: A | Discharge status: Home / Self Care | Payer: Self-pay | Source: Ambulatory Visit | Attending: Cardiovascular Disease | Admitting: Cardiovascular Disease

## 2013-04-28 ENCOUNTER — Encounter (HOSPITAL_COMMUNITY)
Admission: RE | Admit: 2013-04-28 | Discharge: 2013-04-28 | Disposition: A | Discharge status: Home / Self Care | Payer: Self-pay | Source: Ambulatory Visit | Attending: Cardiovascular Disease | Admitting: Cardiovascular Disease

## 2013-04-29 ENCOUNTER — Encounter (HOSPITAL_COMMUNITY): Payer: Self-pay

## 2013-05-05 ENCOUNTER — Encounter (HOSPITAL_COMMUNITY): Payer: Self-pay

## 2013-05-05 DIAGNOSIS — Z5189 Encounter for other specified aftercare: Secondary | ICD-10-CM | POA: Insufficient documentation

## 2013-05-05 DIAGNOSIS — I119 Hypertensive heart disease without heart failure: Secondary | ICD-10-CM | POA: Insufficient documentation

## 2013-05-05 DIAGNOSIS — R0609 Other forms of dyspnea: Secondary | ICD-10-CM | POA: Insufficient documentation

## 2013-05-05 DIAGNOSIS — E663 Overweight: Secondary | ICD-10-CM | POA: Insufficient documentation

## 2013-05-05 DIAGNOSIS — R0989 Other specified symptoms and signs involving the circulatory and respiratory systems: Secondary | ICD-10-CM | POA: Insufficient documentation

## 2013-05-05 DIAGNOSIS — E119 Type 2 diabetes mellitus without complications: Secondary | ICD-10-CM | POA: Insufficient documentation

## 2013-05-06 ENCOUNTER — Encounter (HOSPITAL_COMMUNITY)
Admission: RE | Admit: 2013-05-06 | Discharge: 2013-05-06 | Disposition: A | Discharge status: Home / Self Care | Payer: Self-pay | Source: Ambulatory Visit | Attending: Cardiovascular Disease | Admitting: Cardiovascular Disease

## 2013-05-07 ENCOUNTER — Encounter (HOSPITAL_COMMUNITY)
Admission: RE | Admit: 2013-05-07 | Discharge: 2013-05-07 | Disposition: A | Discharge status: Home / Self Care | Payer: Self-pay | Source: Ambulatory Visit | Attending: Cardiovascular Disease | Admitting: Cardiovascular Disease

## 2013-05-08 ENCOUNTER — Encounter (HOSPITAL_COMMUNITY)
Admission: RE | Admit: 2013-05-08 | Discharge: 2013-05-08 | Disposition: A | Discharge status: Home / Self Care | Payer: Self-pay | Source: Ambulatory Visit | Attending: Cardiovascular Disease | Admitting: Cardiovascular Disease

## 2013-05-12 ENCOUNTER — Encounter (HOSPITAL_COMMUNITY): Payer: Self-pay

## 2013-05-13 ENCOUNTER — Encounter (HOSPITAL_COMMUNITY): Payer: Self-pay

## 2013-05-14 ENCOUNTER — Encounter (HOSPITAL_COMMUNITY): Payer: Self-pay

## 2013-05-15 ENCOUNTER — Encounter (HOSPITAL_COMMUNITY)
Admission: RE | Admit: 2013-05-15 | Discharge: 2013-05-15 | Disposition: A | Discharge status: Home / Self Care | Payer: Self-pay | Source: Ambulatory Visit | Attending: Cardiovascular Disease | Admitting: Cardiovascular Disease

## 2013-05-19 ENCOUNTER — Encounter (HOSPITAL_COMMUNITY): Payer: Self-pay

## 2013-05-20 ENCOUNTER — Encounter (HOSPITAL_COMMUNITY): Payer: Self-pay

## 2013-05-21 ENCOUNTER — Encounter (HOSPITAL_COMMUNITY)
Admission: RE | Admit: 2013-05-21 | Discharge: 2013-05-21 | Disposition: A | Discharge status: Home / Self Care | Payer: Self-pay | Source: Ambulatory Visit | Attending: Cardiovascular Disease | Admitting: Cardiovascular Disease

## 2013-05-22 ENCOUNTER — Encounter (HOSPITAL_COMMUNITY)
Admission: RE | Admit: 2013-05-22 | Discharge: 2013-05-22 | Disposition: A | Discharge status: Home / Self Care | Payer: Self-pay | Source: Ambulatory Visit | Attending: Cardiovascular Disease | Admitting: Cardiovascular Disease

## 2013-05-26 ENCOUNTER — Encounter (HOSPITAL_COMMUNITY)
Admission: RE | Admit: 2013-05-26 | Discharge: 2013-05-26 | Disposition: A | Discharge status: Home / Self Care | Payer: Self-pay | Source: Ambulatory Visit | Attending: Cardiovascular Disease | Admitting: Cardiovascular Disease

## 2013-05-27 ENCOUNTER — Encounter (HOSPITAL_COMMUNITY): Payer: Self-pay

## 2013-05-28 ENCOUNTER — Encounter (HOSPITAL_COMMUNITY)
Admission: RE | Admit: 2013-05-28 | Discharge: 2013-05-28 | Disposition: A | Discharge status: Home / Self Care | Payer: Self-pay | Source: Ambulatory Visit | Attending: Cardiovascular Disease | Admitting: Cardiovascular Disease

## 2013-05-29 ENCOUNTER — Encounter (HOSPITAL_COMMUNITY)
Admission: RE | Admit: 2013-05-29 | Discharge: 2013-05-29 | Disposition: A | Discharge status: Home / Self Care | Payer: Self-pay | Source: Ambulatory Visit | Attending: Cardiovascular Disease | Admitting: Cardiovascular Disease

## 2013-06-03 ENCOUNTER — Encounter (HOSPITAL_COMMUNITY)
Admission: RE | Admit: 2013-06-03 | Discharge: 2013-06-03 | Disposition: A | Discharge status: Home / Self Care | Payer: Self-pay | Source: Ambulatory Visit | Attending: Cardiovascular Disease | Admitting: Cardiovascular Disease

## 2013-06-03 DIAGNOSIS — R0989 Other specified symptoms and signs involving the circulatory and respiratory systems: Secondary | ICD-10-CM | POA: Insufficient documentation

## 2013-06-03 DIAGNOSIS — E663 Overweight: Secondary | ICD-10-CM | POA: Insufficient documentation

## 2013-06-03 DIAGNOSIS — Z5189 Encounter for other specified aftercare: Secondary | ICD-10-CM | POA: Insufficient documentation

## 2013-06-03 DIAGNOSIS — I119 Hypertensive heart disease without heart failure: Secondary | ICD-10-CM | POA: Insufficient documentation

## 2013-06-03 DIAGNOSIS — E119 Type 2 diabetes mellitus without complications: Secondary | ICD-10-CM | POA: Insufficient documentation

## 2013-06-03 DIAGNOSIS — R0609 Other forms of dyspnea: Secondary | ICD-10-CM | POA: Insufficient documentation

## 2013-06-04 ENCOUNTER — Encounter (HOSPITAL_COMMUNITY)
Admission: RE | Admit: 2013-06-04 | Discharge: 2013-06-04 | Disposition: A | Discharge status: Home / Self Care | Payer: Self-pay | Source: Ambulatory Visit | Attending: Cardiovascular Disease | Admitting: Cardiovascular Disease

## 2013-06-05 ENCOUNTER — Encounter (HOSPITAL_COMMUNITY)
Admission: RE | Admit: 2013-06-05 | Discharge: 2013-06-05 | Disposition: A | Discharge status: Home / Self Care | Payer: Self-pay | Source: Ambulatory Visit | Attending: Cardiovascular Disease | Admitting: Cardiovascular Disease

## 2013-06-09 ENCOUNTER — Encounter (HOSPITAL_COMMUNITY): Payer: Self-pay

## 2013-06-10 ENCOUNTER — Encounter (HOSPITAL_COMMUNITY): Payer: Self-pay

## 2013-06-11 ENCOUNTER — Encounter (HOSPITAL_COMMUNITY)
Admission: RE | Admit: 2013-06-11 | Discharge: 2013-06-11 | Disposition: A | Discharge status: Home / Self Care | Payer: Self-pay | Source: Ambulatory Visit | Attending: Cardiovascular Disease | Admitting: Cardiovascular Disease

## 2013-06-16 ENCOUNTER — Encounter (HOSPITAL_COMMUNITY): Payer: Self-pay

## 2013-06-17 ENCOUNTER — Encounter (HOSPITAL_COMMUNITY)
Admission: RE | Admit: 2013-06-17 | Discharge: 2013-06-17 | Disposition: A | Discharge status: Home / Self Care | Payer: Self-pay | Source: Ambulatory Visit | Attending: Cardiovascular Disease | Admitting: Cardiovascular Disease

## 2013-06-18 ENCOUNTER — Encounter (HOSPITAL_COMMUNITY)
Admission: RE | Admit: 2013-06-18 | Discharge: 2013-06-18 | Disposition: A | Discharge status: Home / Self Care | Payer: Self-pay | Source: Ambulatory Visit | Attending: Cardiovascular Disease | Admitting: Cardiovascular Disease

## 2013-06-19 ENCOUNTER — Encounter (HOSPITAL_COMMUNITY)
Admission: RE | Admit: 2013-06-19 | Discharge: 2013-06-19 | Disposition: A | Discharge status: Home / Self Care | Payer: Self-pay | Source: Ambulatory Visit | Attending: Cardiovascular Disease | Admitting: Cardiovascular Disease

## 2013-06-23 ENCOUNTER — Encounter (HOSPITAL_COMMUNITY): Payer: Self-pay

## 2013-06-24 ENCOUNTER — Encounter (HOSPITAL_COMMUNITY)
Admission: RE | Admit: 2013-06-24 | Discharge: 2013-06-24 | Disposition: A | Discharge status: Home / Self Care | Payer: Self-pay | Source: Ambulatory Visit | Attending: Cardiovascular Disease | Admitting: Cardiovascular Disease

## 2013-06-25 ENCOUNTER — Encounter (HOSPITAL_COMMUNITY): Payer: Self-pay

## 2013-06-30 ENCOUNTER — Encounter (HOSPITAL_COMMUNITY)
Admission: RE | Admit: 2013-06-30 | Discharge: 2013-06-30 | Disposition: A | Discharge status: Home / Self Care | Payer: Self-pay | Source: Ambulatory Visit | Attending: Cardiovascular Disease | Admitting: Cardiovascular Disease

## 2013-06-30 DIAGNOSIS — R0989 Other specified symptoms and signs involving the circulatory and respiratory systems: Secondary | ICD-10-CM | POA: Insufficient documentation

## 2013-06-30 DIAGNOSIS — E663 Overweight: Secondary | ICD-10-CM | POA: Insufficient documentation

## 2013-06-30 DIAGNOSIS — E119 Type 2 diabetes mellitus without complications: Secondary | ICD-10-CM | POA: Insufficient documentation

## 2013-06-30 DIAGNOSIS — Z5189 Encounter for other specified aftercare: Secondary | ICD-10-CM | POA: Insufficient documentation

## 2013-06-30 DIAGNOSIS — R0609 Other forms of dyspnea: Secondary | ICD-10-CM | POA: Insufficient documentation

## 2013-06-30 DIAGNOSIS — I119 Hypertensive heart disease without heart failure: Secondary | ICD-10-CM | POA: Insufficient documentation

## 2013-07-01 ENCOUNTER — Telehealth: Payer: Self-pay | Admitting: *Deleted

## 2013-07-01 ENCOUNTER — Encounter (HOSPITAL_COMMUNITY): Payer: Self-pay

## 2013-07-01 NOTE — Telephone Encounter (Signed)
Faxed cardiac rehab order back dated 06/08/13.

## 2013-07-02 ENCOUNTER — Encounter (HOSPITAL_COMMUNITY)
Admission: RE | Admit: 2013-07-02 | Discharge: 2013-07-02 | Disposition: A | Discharge status: Home / Self Care | Payer: Self-pay | Source: Ambulatory Visit | Attending: Cardiovascular Disease | Admitting: Cardiovascular Disease

## 2013-07-03 ENCOUNTER — Encounter (HOSPITAL_COMMUNITY)
Admission: RE | Admit: 2013-07-03 | Discharge: 2013-07-03 | Disposition: A | Discharge status: Home / Self Care | Payer: Self-pay | Source: Ambulatory Visit | Attending: Cardiovascular Disease | Admitting: Cardiovascular Disease

## 2013-07-07 ENCOUNTER — Encounter (HOSPITAL_COMMUNITY): Payer: Self-pay

## 2013-07-08 ENCOUNTER — Encounter (HOSPITAL_COMMUNITY): Payer: Self-pay

## 2013-07-09 ENCOUNTER — Encounter (HOSPITAL_COMMUNITY)
Admission: RE | Admit: 2013-07-09 | Discharge: 2013-07-09 | Disposition: A | Discharge status: Home / Self Care | Payer: Self-pay | Source: Ambulatory Visit | Attending: Cardiovascular Disease | Admitting: Cardiovascular Disease

## 2013-07-10 ENCOUNTER — Encounter (HOSPITAL_COMMUNITY)
Admission: RE | Admit: 2013-07-10 | Discharge: 2013-07-10 | Disposition: A | Discharge status: Home / Self Care | Payer: Self-pay | Source: Ambulatory Visit | Attending: Cardiovascular Disease | Admitting: Cardiovascular Disease

## 2013-07-14 ENCOUNTER — Encounter (HOSPITAL_COMMUNITY): Payer: Self-pay

## 2013-07-15 ENCOUNTER — Encounter (HOSPITAL_COMMUNITY): Payer: Self-pay

## 2013-07-16 ENCOUNTER — Encounter (HOSPITAL_COMMUNITY): Payer: Self-pay

## 2013-07-21 ENCOUNTER — Encounter (HOSPITAL_COMMUNITY): Payer: Self-pay

## 2013-07-22 ENCOUNTER — Encounter (HOSPITAL_COMMUNITY)
Admission: RE | Admit: 2013-07-22 | Discharge: 2013-07-22 | Disposition: A | Discharge status: Home / Self Care | Payer: Self-pay | Source: Ambulatory Visit | Attending: Cardiovascular Disease | Admitting: Cardiovascular Disease

## 2013-07-23 ENCOUNTER — Encounter (HOSPITAL_COMMUNITY)
Admission: RE | Admit: 2013-07-23 | Discharge: 2013-07-23 | Disposition: A | Discharge status: Home / Self Care | Payer: Self-pay | Source: Ambulatory Visit | Attending: Cardiovascular Disease | Admitting: Cardiovascular Disease

## 2013-07-28 ENCOUNTER — Encounter (HOSPITAL_COMMUNITY)
Admission: RE | Admit: 2013-07-28 | Discharge: 2013-07-28 | Disposition: A | Discharge status: Home / Self Care | Payer: Self-pay | Source: Ambulatory Visit | Attending: Cardiovascular Disease | Admitting: Cardiovascular Disease

## 2013-07-29 ENCOUNTER — Encounter (HOSPITAL_COMMUNITY): Payer: Self-pay

## 2013-07-29 DIAGNOSIS — I119 Hypertensive heart disease without heart failure: Secondary | ICD-10-CM | POA: Insufficient documentation

## 2013-07-29 DIAGNOSIS — R0609 Other forms of dyspnea: Secondary | ICD-10-CM | POA: Insufficient documentation

## 2013-07-29 DIAGNOSIS — E119 Type 2 diabetes mellitus without complications: Secondary | ICD-10-CM | POA: Insufficient documentation

## 2013-07-29 DIAGNOSIS — Z5189 Encounter for other specified aftercare: Secondary | ICD-10-CM | POA: Insufficient documentation

## 2013-07-29 DIAGNOSIS — R0989 Other specified symptoms and signs involving the circulatory and respiratory systems: Secondary | ICD-10-CM | POA: Insufficient documentation

## 2013-07-29 DIAGNOSIS — E663 Overweight: Secondary | ICD-10-CM | POA: Insufficient documentation

## 2013-07-30 ENCOUNTER — Encounter (HOSPITAL_COMMUNITY)
Admission: RE | Admit: 2013-07-30 | Discharge: 2013-07-30 | Disposition: A | Discharge status: Home / Self Care | Payer: Self-pay | Source: Ambulatory Visit | Attending: Cardiovascular Disease | Admitting: Cardiovascular Disease

## 2013-07-31 ENCOUNTER — Encounter (HOSPITAL_COMMUNITY)
Admission: RE | Admit: 2013-07-31 | Discharge: 2013-07-31 | Disposition: A | Discharge status: Home / Self Care | Payer: Self-pay | Source: Ambulatory Visit | Attending: Cardiovascular Disease | Admitting: Cardiovascular Disease

## 2013-08-04 ENCOUNTER — Encounter (HOSPITAL_COMMUNITY): Payer: Self-pay

## 2013-08-05 ENCOUNTER — Encounter (HOSPITAL_COMMUNITY)
Admission: RE | Admit: 2013-08-05 | Discharge: 2013-08-05 | Disposition: A | Discharge status: Home / Self Care | Payer: Self-pay | Source: Ambulatory Visit | Attending: Cardiovascular Disease | Admitting: Cardiovascular Disease

## 2013-08-06 ENCOUNTER — Encounter (HOSPITAL_COMMUNITY)
Admission: RE | Admit: 2013-08-06 | Discharge: 2013-08-06 | Disposition: A | Discharge status: Home / Self Care | Payer: Self-pay | Source: Ambulatory Visit | Attending: Cardiovascular Disease | Admitting: Cardiovascular Disease

## 2013-08-07 ENCOUNTER — Encounter (HOSPITAL_COMMUNITY)
Admission: RE | Admit: 2013-08-07 | Discharge: 2013-08-07 | Disposition: A | Discharge status: Home / Self Care | Payer: Self-pay | Source: Ambulatory Visit | Attending: Cardiovascular Disease | Admitting: Cardiovascular Disease

## 2013-08-11 ENCOUNTER — Encounter (HOSPITAL_COMMUNITY): Payer: Self-pay

## 2013-08-12 ENCOUNTER — Encounter (HOSPITAL_COMMUNITY)
Admission: RE | Admit: 2013-08-12 | Discharge: 2013-08-12 | Disposition: A | Discharge status: Home / Self Care | Payer: Self-pay | Source: Ambulatory Visit | Attending: Cardiovascular Disease | Admitting: Cardiovascular Disease

## 2013-08-13 ENCOUNTER — Encounter (HOSPITAL_COMMUNITY)
Admission: RE | Admit: 2013-08-13 | Discharge: 2013-08-13 | Disposition: A | Discharge status: Home / Self Care | Payer: Self-pay | Source: Ambulatory Visit | Attending: Cardiovascular Disease | Admitting: Cardiovascular Disease

## 2013-08-16 ENCOUNTER — Other Ambulatory Visit: Payer: Self-pay | Admitting: Cardiovascular Disease

## 2013-08-17 NOTE — Telephone Encounter (Signed)
Rx was sent to pharmacy electronically. 

## 2013-08-18 ENCOUNTER — Encounter (HOSPITAL_COMMUNITY)
Admission: RE | Admit: 2013-08-18 | Discharge: 2013-08-18 | Disposition: A | Discharge status: Home / Self Care | Payer: Self-pay | Source: Ambulatory Visit | Attending: Cardiovascular Disease | Admitting: Cardiovascular Disease

## 2013-08-19 ENCOUNTER — Encounter (HOSPITAL_COMMUNITY): Payer: Self-pay

## 2013-08-20 ENCOUNTER — Encounter (HOSPITAL_COMMUNITY): Payer: Self-pay

## 2013-08-25 ENCOUNTER — Encounter (HOSPITAL_COMMUNITY)
Admission: RE | Admit: 2013-08-25 | Discharge: 2013-08-25 | Disposition: A | Discharge status: Home / Self Care | Payer: Self-pay | Source: Ambulatory Visit | Attending: Cardiovascular Disease | Admitting: Cardiovascular Disease

## 2013-08-26 ENCOUNTER — Encounter (HOSPITAL_COMMUNITY): Payer: Self-pay

## 2013-08-27 ENCOUNTER — Encounter (HOSPITAL_COMMUNITY)
Admission: RE | Admit: 2013-08-27 | Discharge: 2013-08-27 | Disposition: A | Discharge status: Home / Self Care | Payer: Self-pay | Source: Ambulatory Visit | Attending: Cardiovascular Disease | Admitting: Cardiovascular Disease

## 2013-09-01 ENCOUNTER — Encounter (HOSPITAL_COMMUNITY)
Admission: RE | Admit: 2013-09-01 | Discharge: 2013-09-01 | Disposition: A | Discharge status: Home / Self Care | Payer: Self-pay | Source: Ambulatory Visit | Attending: Cardiovascular Disease | Admitting: Cardiovascular Disease

## 2013-09-01 DIAGNOSIS — Z5189 Encounter for other specified aftercare: Secondary | ICD-10-CM | POA: Insufficient documentation

## 2013-09-01 DIAGNOSIS — R0609 Other forms of dyspnea: Secondary | ICD-10-CM | POA: Insufficient documentation

## 2013-09-01 DIAGNOSIS — E119 Type 2 diabetes mellitus without complications: Secondary | ICD-10-CM | POA: Insufficient documentation

## 2013-09-01 DIAGNOSIS — R0989 Other specified symptoms and signs involving the circulatory and respiratory systems: Secondary | ICD-10-CM | POA: Insufficient documentation

## 2013-09-01 DIAGNOSIS — E663 Overweight: Secondary | ICD-10-CM | POA: Insufficient documentation

## 2013-09-01 DIAGNOSIS — I119 Hypertensive heart disease without heart failure: Secondary | ICD-10-CM | POA: Insufficient documentation

## 2013-09-02 ENCOUNTER — Encounter (HOSPITAL_COMMUNITY): Payer: Self-pay

## 2013-09-03 ENCOUNTER — Encounter (HOSPITAL_COMMUNITY)
Admission: RE | Admit: 2013-09-03 | Discharge: 2013-09-03 | Disposition: A | Discharge status: Home / Self Care | Payer: Self-pay | Source: Ambulatory Visit | Attending: Cardiovascular Disease | Admitting: Cardiovascular Disease

## 2013-09-08 ENCOUNTER — Encounter (HOSPITAL_COMMUNITY)
Admission: RE | Admit: 2013-09-08 | Discharge: 2013-09-08 | Disposition: A | Discharge status: Home / Self Care | Payer: Self-pay | Source: Ambulatory Visit | Attending: Cardiovascular Disease | Admitting: Cardiovascular Disease

## 2013-09-09 ENCOUNTER — Encounter (HOSPITAL_COMMUNITY): Payer: Self-pay

## 2013-09-10 ENCOUNTER — Encounter (HOSPITAL_COMMUNITY)
Admission: RE | Admit: 2013-09-10 | Discharge: 2013-09-10 | Disposition: A | Discharge status: Home / Self Care | Payer: Self-pay | Source: Ambulatory Visit | Attending: Cardiovascular Disease | Admitting: Cardiovascular Disease

## 2013-09-15 ENCOUNTER — Encounter (HOSPITAL_COMMUNITY)
Admission: RE | Admit: 2013-09-15 | Discharge: 2013-09-15 | Disposition: A | Discharge status: Home / Self Care | Payer: Self-pay | Source: Ambulatory Visit | Attending: Cardiovascular Disease | Admitting: Cardiovascular Disease

## 2013-09-16 ENCOUNTER — Encounter (HOSPITAL_COMMUNITY): Payer: Self-pay

## 2013-09-17 ENCOUNTER — Encounter (HOSPITAL_COMMUNITY)
Admission: RE | Admit: 2013-09-17 | Discharge: 2013-09-17 | Disposition: A | Discharge status: Home / Self Care | Payer: Self-pay | Source: Ambulatory Visit | Attending: Cardiovascular Disease | Admitting: Cardiovascular Disease

## 2013-09-19 ENCOUNTER — Other Ambulatory Visit: Payer: Self-pay | Admitting: Cardiovascular Disease

## 2013-09-22 ENCOUNTER — Encounter (HOSPITAL_COMMUNITY)
Admission: RE | Admit: 2013-09-22 | Discharge: 2013-09-22 | Disposition: A | Discharge status: Home / Self Care | Payer: Self-pay | Source: Ambulatory Visit | Attending: Cardiovascular Disease | Admitting: Cardiovascular Disease

## 2013-09-22 NOTE — Telephone Encounter (Signed)
Rx was sent to pharmacy electronically. 

## 2013-09-23 ENCOUNTER — Encounter (HOSPITAL_COMMUNITY): Payer: Self-pay

## 2013-09-24 ENCOUNTER — Encounter (HOSPITAL_COMMUNITY)
Admission: RE | Admit: 2013-09-24 | Discharge: 2013-09-24 | Disposition: A | Discharge status: Home / Self Care | Payer: Self-pay | Source: Ambulatory Visit | Attending: Cardiovascular Disease | Admitting: Cardiovascular Disease

## 2013-09-29 ENCOUNTER — Encounter (HOSPITAL_COMMUNITY)
Admission: RE | Admit: 2013-09-29 | Discharge: 2013-09-29 | Disposition: A | Discharge status: Home / Self Care | Payer: Self-pay | Source: Ambulatory Visit | Attending: Cardiovascular Disease | Admitting: Cardiovascular Disease

## 2013-09-29 DIAGNOSIS — I119 Hypertensive heart disease without heart failure: Secondary | ICD-10-CM | POA: Insufficient documentation

## 2013-09-29 DIAGNOSIS — E663 Overweight: Secondary | ICD-10-CM | POA: Insufficient documentation

## 2013-09-29 DIAGNOSIS — Z5189 Encounter for other specified aftercare: Secondary | ICD-10-CM | POA: Insufficient documentation

## 2013-09-29 DIAGNOSIS — R0609 Other forms of dyspnea: Secondary | ICD-10-CM | POA: Insufficient documentation

## 2013-09-29 DIAGNOSIS — E119 Type 2 diabetes mellitus without complications: Secondary | ICD-10-CM | POA: Insufficient documentation

## 2013-09-29 DIAGNOSIS — R0989 Other specified symptoms and signs involving the circulatory and respiratory systems: Secondary | ICD-10-CM | POA: Insufficient documentation

## 2013-09-30 ENCOUNTER — Encounter (HOSPITAL_COMMUNITY): Payer: Self-pay

## 2013-10-01 ENCOUNTER — Encounter (HOSPITAL_COMMUNITY)
Admission: RE | Admit: 2013-10-01 | Discharge: 2013-10-01 | Disposition: A | Discharge status: Home / Self Care | Payer: Self-pay | Source: Ambulatory Visit | Attending: Cardiovascular Disease | Admitting: Cardiovascular Disease

## 2013-10-06 ENCOUNTER — Encounter (HOSPITAL_COMMUNITY): Payer: Self-pay

## 2013-10-07 ENCOUNTER — Encounter (HOSPITAL_COMMUNITY): Payer: Self-pay

## 2013-10-08 ENCOUNTER — Encounter (HOSPITAL_COMMUNITY): Payer: Self-pay

## 2013-10-13 ENCOUNTER — Encounter (HOSPITAL_COMMUNITY)
Admission: RE | Admit: 2013-10-13 | Discharge: 2013-10-13 | Disposition: A | Discharge status: Home / Self Care | Payer: Self-pay | Source: Ambulatory Visit | Attending: Cardiovascular Disease | Admitting: Cardiovascular Disease

## 2013-10-14 ENCOUNTER — Encounter (HOSPITAL_COMMUNITY): Payer: Self-pay

## 2013-10-15 ENCOUNTER — Encounter (HOSPITAL_COMMUNITY): Payer: Self-pay

## 2013-10-16 ENCOUNTER — Encounter (HOSPITAL_COMMUNITY)
Admission: RE | Admit: 2013-10-16 | Discharge: 2013-10-16 | Disposition: A | Discharge status: Home / Self Care | Payer: Self-pay | Source: Ambulatory Visit | Attending: Cardiovascular Disease | Admitting: Cardiovascular Disease

## 2013-10-18 ENCOUNTER — Other Ambulatory Visit: Payer: Self-pay | Admitting: Cardiovascular Disease

## 2013-10-20 ENCOUNTER — Encounter (HOSPITAL_COMMUNITY): Payer: Self-pay

## 2013-10-21 ENCOUNTER — Encounter (HOSPITAL_COMMUNITY): Payer: Self-pay

## 2013-10-22 ENCOUNTER — Encounter (HOSPITAL_COMMUNITY)
Admission: RE | Admit: 2013-10-22 | Discharge: 2013-10-22 | Disposition: A | Discharge status: Home / Self Care | Payer: Self-pay | Source: Ambulatory Visit | Attending: Cardiovascular Disease | Admitting: Cardiovascular Disease

## 2013-10-27 ENCOUNTER — Encounter (HOSPITAL_COMMUNITY): Payer: Self-pay

## 2013-10-28 ENCOUNTER — Encounter (HOSPITAL_COMMUNITY): Payer: Self-pay

## 2013-10-28 DIAGNOSIS — R0989 Other specified symptoms and signs involving the circulatory and respiratory systems: Secondary | ICD-10-CM | POA: Insufficient documentation

## 2013-10-28 DIAGNOSIS — R0609 Other forms of dyspnea: Secondary | ICD-10-CM | POA: Insufficient documentation

## 2013-10-28 DIAGNOSIS — E119 Type 2 diabetes mellitus without complications: Secondary | ICD-10-CM | POA: Insufficient documentation

## 2013-10-28 DIAGNOSIS — Z5189 Encounter for other specified aftercare: Secondary | ICD-10-CM | POA: Insufficient documentation

## 2013-10-28 DIAGNOSIS — E663 Overweight: Secondary | ICD-10-CM | POA: Insufficient documentation

## 2013-10-28 DIAGNOSIS — I119 Hypertensive heart disease without heart failure: Secondary | ICD-10-CM | POA: Insufficient documentation

## 2013-10-29 ENCOUNTER — Encounter (HOSPITAL_COMMUNITY)
Admission: RE | Admit: 2013-10-29 | Discharge: 2013-10-29 | Disposition: A | Discharge status: Home / Self Care | Payer: Self-pay | Source: Ambulatory Visit | Attending: Cardiovascular Disease | Admitting: Cardiovascular Disease

## 2013-11-03 ENCOUNTER — Encounter (HOSPITAL_COMMUNITY)
Admission: RE | Admit: 2013-11-03 | Discharge: 2013-11-03 | Disposition: A | Discharge status: Home / Self Care | Payer: Self-pay | Source: Ambulatory Visit | Attending: Cardiovascular Disease | Admitting: Cardiovascular Disease

## 2013-11-04 ENCOUNTER — Encounter (HOSPITAL_COMMUNITY): Payer: Self-pay

## 2013-11-04 ENCOUNTER — Other Ambulatory Visit: Payer: Self-pay | Admitting: Cardiovascular Disease

## 2013-11-05 ENCOUNTER — Encounter (HOSPITAL_COMMUNITY)
Admission: RE | Admit: 2013-11-05 | Discharge: 2013-11-05 | Disposition: A | Discharge status: Home / Self Care | Payer: Self-pay | Source: Ambulatory Visit | Attending: Cardiovascular Disease | Admitting: Cardiovascular Disease

## 2013-11-06 NOTE — Telephone Encounter (Signed)
Rx was sent to pharmacy electronically. Last OV 05/2012

## 2013-11-10 ENCOUNTER — Encounter (HOSPITAL_COMMUNITY)
Admission: RE | Admit: 2013-11-10 | Discharge: 2013-11-10 | Disposition: A | Discharge status: Home / Self Care | Payer: Self-pay | Source: Ambulatory Visit | Attending: Cardiovascular Disease | Admitting: Cardiovascular Disease

## 2013-11-11 ENCOUNTER — Encounter (HOSPITAL_COMMUNITY): Payer: Self-pay

## 2013-11-12 ENCOUNTER — Encounter (HOSPITAL_COMMUNITY)
Admission: RE | Admit: 2013-11-12 | Discharge: 2013-11-12 | Disposition: A | Discharge status: Home / Self Care | Payer: Self-pay | Source: Ambulatory Visit | Attending: Cardiovascular Disease | Admitting: Cardiovascular Disease

## 2013-11-17 ENCOUNTER — Encounter (HOSPITAL_COMMUNITY)
Admission: RE | Admit: 2013-11-17 | Discharge: 2013-11-17 | Disposition: A | Discharge status: Home / Self Care | Payer: Self-pay | Source: Ambulatory Visit | Attending: Cardiovascular Disease | Admitting: Cardiovascular Disease

## 2013-11-18 ENCOUNTER — Encounter (HOSPITAL_COMMUNITY): Payer: Self-pay

## 2013-11-19 ENCOUNTER — Encounter (HOSPITAL_COMMUNITY)
Admission: RE | Admit: 2013-11-19 | Discharge: 2013-11-19 | Disposition: A | Discharge status: Home / Self Care | Payer: Self-pay | Source: Ambulatory Visit | Attending: Cardiovascular Disease | Admitting: Cardiovascular Disease

## 2013-11-24 ENCOUNTER — Encounter (HOSPITAL_COMMUNITY)
Admission: RE | Admit: 2013-11-24 | Discharge: 2013-11-24 | Disposition: A | Discharge status: Home / Self Care | Payer: Self-pay | Source: Ambulatory Visit | Attending: Cardiovascular Disease | Admitting: Cardiovascular Disease

## 2013-11-25 ENCOUNTER — Encounter (HOSPITAL_COMMUNITY): Payer: Self-pay

## 2013-11-26 ENCOUNTER — Encounter (HOSPITAL_COMMUNITY): Payer: Self-pay

## 2013-12-01 ENCOUNTER — Encounter (HOSPITAL_COMMUNITY): Payer: BC Managed Care – PPO

## 2013-12-01 DIAGNOSIS — E119 Type 2 diabetes mellitus without complications: Secondary | ICD-10-CM | POA: Diagnosis not present

## 2013-12-01 DIAGNOSIS — I119 Hypertensive heart disease without heart failure: Secondary | ICD-10-CM | POA: Diagnosis not present

## 2013-12-01 DIAGNOSIS — R0609 Other forms of dyspnea: Secondary | ICD-10-CM | POA: Diagnosis not present

## 2013-12-01 DIAGNOSIS — E663 Overweight: Secondary | ICD-10-CM | POA: Insufficient documentation

## 2013-12-01 DIAGNOSIS — R0989 Other specified symptoms and signs involving the circulatory and respiratory systems: Secondary | ICD-10-CM | POA: Diagnosis not present

## 2013-12-01 DIAGNOSIS — Z5189 Encounter for other specified aftercare: Secondary | ICD-10-CM | POA: Insufficient documentation

## 2013-12-02 ENCOUNTER — Encounter (HOSPITAL_COMMUNITY): Payer: BC Managed Care – PPO

## 2013-12-03 ENCOUNTER — Encounter (HOSPITAL_COMMUNITY): Payer: BC Managed Care – PPO

## 2013-12-08 ENCOUNTER — Encounter (HOSPITAL_COMMUNITY)
Admission: RE | Admit: 2013-12-08 | Discharge: 2013-12-08 | Disposition: A | Discharge status: Home / Self Care | Payer: BC Managed Care – PPO | Source: Ambulatory Visit | Attending: Cardiovascular Disease | Admitting: Cardiovascular Disease

## 2013-12-09 ENCOUNTER — Encounter (HOSPITAL_COMMUNITY): Payer: BC Managed Care – PPO

## 2013-12-10 ENCOUNTER — Encounter (HOSPITAL_COMMUNITY): Payer: BC Managed Care – PPO

## 2013-12-11 ENCOUNTER — Encounter (HOSPITAL_COMMUNITY)
Admission: RE | Admit: 2013-12-11 | Discharge: 2013-12-11 | Disposition: A | Discharge status: Home / Self Care | Payer: BC Managed Care – PPO | Source: Ambulatory Visit | Attending: Cardiovascular Disease | Admitting: Cardiovascular Disease

## 2013-12-14 ENCOUNTER — Telehealth: Payer: Self-pay | Admitting: *Deleted

## 2013-12-14 NOTE — Telephone Encounter (Signed)
Faxed cardiac rehab prescription to [place] [facility].

## 2013-12-15 ENCOUNTER — Encounter (HOSPITAL_COMMUNITY)
Admission: RE | Admit: 2013-12-15 | Discharge: 2013-12-15 | Disposition: A | Discharge status: Home / Self Care | Payer: BC Managed Care – PPO | Source: Ambulatory Visit | Attending: Cardiovascular Disease | Admitting: Cardiovascular Disease

## 2013-12-16 ENCOUNTER — Encounter (HOSPITAL_COMMUNITY): Payer: BC Managed Care – PPO

## 2013-12-17 ENCOUNTER — Encounter (HOSPITAL_COMMUNITY)
Admission: RE | Admit: 2013-12-17 | Discharge: 2013-12-17 | Disposition: A | Discharge status: Home / Self Care | Payer: BC Managed Care – PPO | Source: Ambulatory Visit | Attending: Cardiovascular Disease | Admitting: Cardiovascular Disease

## 2013-12-21 ENCOUNTER — Encounter (HOSPITAL_COMMUNITY)
Admission: RE | Admit: 2013-12-21 | Discharge: 2013-12-21 | Disposition: A | Discharge status: Home / Self Care | Payer: BC Managed Care – PPO | Source: Ambulatory Visit | Attending: Cardiovascular Disease | Admitting: Cardiovascular Disease

## 2013-12-22 ENCOUNTER — Encounter (HOSPITAL_COMMUNITY): Payer: BC Managed Care – PPO

## 2013-12-23 ENCOUNTER — Encounter (HOSPITAL_COMMUNITY): Payer: BC Managed Care – PPO

## 2013-12-24 ENCOUNTER — Encounter (HOSPITAL_COMMUNITY)
Admission: RE | Admit: 2013-12-24 | Discharge: 2013-12-24 | Disposition: A | Discharge status: Home / Self Care | Payer: BC Managed Care – PPO | Source: Ambulatory Visit | Attending: Cardiovascular Disease | Admitting: Cardiovascular Disease

## 2013-12-29 ENCOUNTER — Encounter (HOSPITAL_COMMUNITY)
Admission: RE | Admit: 2013-12-29 | Discharge: 2013-12-29 | Disposition: A | Discharge status: Home / Self Care | Payer: BC Managed Care – PPO | Source: Ambulatory Visit | Attending: Cardiovascular Disease | Admitting: Cardiovascular Disease

## 2013-12-29 DIAGNOSIS — I10 Essential (primary) hypertension: Secondary | ICD-10-CM | POA: Diagnosis not present

## 2013-12-30 ENCOUNTER — Encounter (HOSPITAL_COMMUNITY): Payer: BC Managed Care – PPO

## 2013-12-31 ENCOUNTER — Encounter (HOSPITAL_COMMUNITY): Payer: BC Managed Care – PPO

## 2014-01-05 ENCOUNTER — Encounter (HOSPITAL_COMMUNITY): Payer: BC Managed Care – PPO

## 2014-01-06 ENCOUNTER — Encounter (HOSPITAL_COMMUNITY): Payer: BC Managed Care – PPO

## 2014-01-07 ENCOUNTER — Encounter (HOSPITAL_COMMUNITY)
Admission: RE | Admit: 2014-01-07 | Discharge: 2014-01-07 | Disposition: A | Discharge status: Home / Self Care | Payer: BC Managed Care – PPO | Source: Ambulatory Visit | Attending: Cardiovascular Disease | Admitting: Cardiovascular Disease

## 2014-01-12 ENCOUNTER — Encounter (HOSPITAL_COMMUNITY)
Admission: RE | Admit: 2014-01-12 | Discharge: 2014-01-12 | Disposition: A | Discharge status: Home / Self Care | Payer: BC Managed Care – PPO | Source: Ambulatory Visit | Attending: Cardiovascular Disease | Admitting: Cardiovascular Disease

## 2014-01-13 ENCOUNTER — Encounter (HOSPITAL_COMMUNITY): Payer: BC Managed Care – PPO

## 2014-01-14 ENCOUNTER — Encounter (HOSPITAL_COMMUNITY)
Admission: RE | Admit: 2014-01-14 | Discharge: 2014-01-14 | Disposition: A | Discharge status: Home / Self Care | Payer: BC Managed Care – PPO | Source: Ambulatory Visit | Attending: Cardiovascular Disease | Admitting: Cardiovascular Disease

## 2014-01-19 ENCOUNTER — Encounter (HOSPITAL_COMMUNITY): Payer: BC Managed Care – PPO

## 2014-01-20 ENCOUNTER — Encounter (HOSPITAL_COMMUNITY): Payer: BC Managed Care – PPO

## 2014-01-21 ENCOUNTER — Encounter (HOSPITAL_COMMUNITY)
Admission: RE | Admit: 2014-01-21 | Discharge: 2014-01-21 | Disposition: A | Discharge status: Home / Self Care | Payer: BC Managed Care – PPO | Source: Ambulatory Visit | Attending: Cardiovascular Disease | Admitting: Cardiovascular Disease

## 2014-01-26 ENCOUNTER — Encounter (HOSPITAL_COMMUNITY)
Admission: RE | Admit: 2014-01-26 | Discharge: 2014-01-26 | Disposition: A | Discharge status: Home / Self Care | Payer: BC Managed Care – PPO | Source: Ambulatory Visit | Attending: Cardiovascular Disease | Admitting: Cardiovascular Disease

## 2014-01-27 ENCOUNTER — Encounter (HOSPITAL_COMMUNITY): Payer: BC Managed Care – PPO

## 2014-01-28 ENCOUNTER — Encounter (HOSPITAL_COMMUNITY)
Admission: RE | Admit: 2014-01-28 | Discharge: 2014-01-28 | Disposition: A | Discharge status: Home / Self Care | Payer: BC Managed Care – PPO | Source: Ambulatory Visit | Attending: Cardiovascular Disease | Admitting: Cardiovascular Disease

## 2014-01-28 DIAGNOSIS — E663 Overweight: Secondary | ICD-10-CM | POA: Insufficient documentation

## 2014-01-28 DIAGNOSIS — R0600 Dyspnea, unspecified: Secondary | ICD-10-CM | POA: Diagnosis not present

## 2014-01-28 DIAGNOSIS — E119 Type 2 diabetes mellitus without complications: Secondary | ICD-10-CM | POA: Insufficient documentation

## 2014-01-28 DIAGNOSIS — I119 Hypertensive heart disease without heart failure: Secondary | ICD-10-CM | POA: Insufficient documentation

## 2014-01-28 DIAGNOSIS — Z5189 Encounter for other specified aftercare: Secondary | ICD-10-CM | POA: Insufficient documentation

## 2014-02-02 ENCOUNTER — Encounter (HOSPITAL_COMMUNITY)
Admission: RE | Admit: 2014-02-02 | Discharge: 2014-02-02 | Disposition: A | Discharge status: Home / Self Care | Payer: BC Managed Care – PPO | Source: Ambulatory Visit | Attending: Cardiovascular Disease | Admitting: Cardiovascular Disease

## 2014-02-03 ENCOUNTER — Encounter (HOSPITAL_COMMUNITY): Payer: BC Managed Care – PPO

## 2014-02-04 ENCOUNTER — Encounter (HOSPITAL_COMMUNITY): Payer: BC Managed Care – PPO

## 2014-02-09 ENCOUNTER — Encounter (HOSPITAL_COMMUNITY): Payer: BC Managed Care – PPO

## 2014-02-10 ENCOUNTER — Encounter (HOSPITAL_COMMUNITY)
Admission: RE | Admit: 2014-02-10 | Discharge: 2014-02-10 | Disposition: A | Discharge status: Home / Self Care | Payer: BC Managed Care – PPO | Source: Ambulatory Visit | Attending: Cardiovascular Disease | Admitting: Cardiovascular Disease

## 2014-02-11 ENCOUNTER — Encounter (HOSPITAL_COMMUNITY): Payer: BC Managed Care – PPO

## 2014-02-16 ENCOUNTER — Encounter (HOSPITAL_COMMUNITY)
Admission: RE | Admit: 2014-02-16 | Discharge: 2014-02-16 | Disposition: A | Discharge status: Home / Self Care | Payer: BC Managed Care – PPO | Source: Ambulatory Visit | Attending: Cardiovascular Disease | Admitting: Cardiovascular Disease

## 2014-02-17 ENCOUNTER — Encounter (HOSPITAL_COMMUNITY): Payer: BC Managed Care – PPO

## 2014-02-18 ENCOUNTER — Encounter (HOSPITAL_COMMUNITY)
Admission: RE | Admit: 2014-02-18 | Discharge: 2014-02-18 | Disposition: A | Discharge status: Home / Self Care | Payer: BC Managed Care – PPO | Source: Ambulatory Visit | Attending: Cardiovascular Disease | Admitting: Cardiovascular Disease

## 2014-02-23 ENCOUNTER — Encounter (HOSPITAL_COMMUNITY)
Admission: RE | Admit: 2014-02-23 | Discharge: 2014-02-23 | Disposition: A | Discharge status: Home / Self Care | Payer: BC Managed Care – PPO | Source: Ambulatory Visit | Attending: Cardiovascular Disease | Admitting: Cardiovascular Disease

## 2014-02-24 ENCOUNTER — Encounter (HOSPITAL_COMMUNITY): Payer: BC Managed Care – PPO

## 2014-02-25 ENCOUNTER — Encounter (HOSPITAL_COMMUNITY)
Admission: RE | Admit: 2014-02-25 | Discharge: 2014-02-25 | Disposition: A | Discharge status: Home / Self Care | Payer: BC Managed Care – PPO | Source: Ambulatory Visit | Attending: Cardiovascular Disease | Admitting: Cardiovascular Disease

## 2014-03-02 ENCOUNTER — Encounter (HOSPITAL_COMMUNITY)
Admission: RE | Admit: 2014-03-02 | Discharge: 2014-03-02 | Disposition: A | Discharge status: Home / Self Care | Payer: BC Managed Care – PPO | Source: Ambulatory Visit | Attending: Cardiovascular Disease | Admitting: Cardiovascular Disease

## 2014-03-02 DIAGNOSIS — R0600 Dyspnea, unspecified: Secondary | ICD-10-CM | POA: Insufficient documentation

## 2014-03-02 DIAGNOSIS — E119 Type 2 diabetes mellitus without complications: Secondary | ICD-10-CM | POA: Insufficient documentation

## 2014-03-02 DIAGNOSIS — Z5189 Encounter for other specified aftercare: Secondary | ICD-10-CM | POA: Diagnosis not present

## 2014-03-02 DIAGNOSIS — I119 Hypertensive heart disease without heart failure: Secondary | ICD-10-CM | POA: Insufficient documentation

## 2014-03-02 DIAGNOSIS — E663 Overweight: Secondary | ICD-10-CM | POA: Insufficient documentation

## 2014-03-03 ENCOUNTER — Encounter (HOSPITAL_COMMUNITY): Payer: BC Managed Care – PPO

## 2014-03-04 ENCOUNTER — Encounter (HOSPITAL_COMMUNITY): Payer: BC Managed Care – PPO

## 2014-03-09 ENCOUNTER — Encounter (HOSPITAL_COMMUNITY)
Admission: RE | Admit: 2014-03-09 | Discharge: 2014-03-09 | Disposition: A | Discharge status: Home / Self Care | Payer: BC Managed Care – PPO | Source: Ambulatory Visit | Attending: Cardiovascular Disease | Admitting: Cardiovascular Disease

## 2014-03-10 ENCOUNTER — Encounter (HOSPITAL_COMMUNITY): Payer: BC Managed Care – PPO

## 2014-03-11 ENCOUNTER — Encounter (HOSPITAL_COMMUNITY): Payer: BC Managed Care – PPO

## 2014-03-16 ENCOUNTER — Encounter (HOSPITAL_COMMUNITY)
Admission: RE | Admit: 2014-03-16 | Discharge: 2014-03-16 | Disposition: A | Discharge status: Home / Self Care | Payer: BC Managed Care – PPO | Source: Ambulatory Visit | Attending: Cardiovascular Disease | Admitting: Cardiovascular Disease

## 2014-03-17 ENCOUNTER — Encounter (HOSPITAL_COMMUNITY): Payer: BC Managed Care – PPO

## 2014-03-18 ENCOUNTER — Encounter (HOSPITAL_COMMUNITY)
Admission: RE | Admit: 2014-03-18 | Discharge: 2014-03-18 | Disposition: A | Discharge status: Home / Self Care | Payer: BC Managed Care – PPO | Source: Ambulatory Visit | Attending: Cardiovascular Disease | Admitting: Cardiovascular Disease

## 2014-03-23 ENCOUNTER — Encounter (HOSPITAL_COMMUNITY)
Admission: RE | Admit: 2014-03-23 | Discharge: 2014-03-23 | Disposition: A | Discharge status: Home / Self Care | Payer: BC Managed Care – PPO | Source: Ambulatory Visit | Attending: Cardiovascular Disease | Admitting: Cardiovascular Disease

## 2014-03-24 ENCOUNTER — Encounter (HOSPITAL_COMMUNITY): Payer: BC Managed Care – PPO

## 2014-03-30 ENCOUNTER — Encounter (HOSPITAL_COMMUNITY)
Admission: RE | Admit: 2014-03-30 | Discharge: 2014-03-30 | Disposition: A | Discharge status: Home / Self Care | Payer: BC Managed Care – PPO | Source: Ambulatory Visit | Attending: Cardiovascular Disease | Admitting: Cardiovascular Disease

## 2014-03-30 DIAGNOSIS — E119 Type 2 diabetes mellitus without complications: Secondary | ICD-10-CM | POA: Insufficient documentation

## 2014-03-30 DIAGNOSIS — R0600 Dyspnea, unspecified: Secondary | ICD-10-CM | POA: Insufficient documentation

## 2014-03-30 DIAGNOSIS — I119 Hypertensive heart disease without heart failure: Secondary | ICD-10-CM | POA: Insufficient documentation

## 2014-03-30 DIAGNOSIS — Z5189 Encounter for other specified aftercare: Secondary | ICD-10-CM | POA: Insufficient documentation

## 2014-03-30 DIAGNOSIS — E663 Overweight: Secondary | ICD-10-CM | POA: Insufficient documentation

## 2014-03-31 ENCOUNTER — Encounter (HOSPITAL_COMMUNITY): Payer: BC Managed Care – PPO

## 2014-04-01 ENCOUNTER — Encounter (HOSPITAL_COMMUNITY)
Admission: RE | Admit: 2014-04-01 | Discharge: 2014-04-01 | Disposition: A | Discharge status: Home / Self Care | Payer: BC Managed Care – PPO | Source: Ambulatory Visit | Attending: Cardiovascular Disease | Admitting: Cardiovascular Disease

## 2014-04-06 ENCOUNTER — Encounter (HOSPITAL_COMMUNITY): Payer: BC Managed Care – PPO

## 2014-04-07 ENCOUNTER — Encounter (HOSPITAL_COMMUNITY): Payer: BC Managed Care – PPO

## 2014-04-08 ENCOUNTER — Encounter (HOSPITAL_COMMUNITY)
Admission: RE | Admit: 2014-04-08 | Discharge: 2014-04-08 | Disposition: A | Discharge status: Home / Self Care | Payer: BC Managed Care – PPO | Source: Ambulatory Visit | Attending: Cardiovascular Disease | Admitting: Cardiovascular Disease

## 2014-04-13 ENCOUNTER — Encounter (HOSPITAL_COMMUNITY)
Admission: RE | Admit: 2014-04-13 | Discharge: 2014-04-13 | Disposition: A | Discharge status: Home / Self Care | Payer: BC Managed Care – PPO | Source: Ambulatory Visit | Attending: Cardiovascular Disease | Admitting: Cardiovascular Disease

## 2014-04-14 ENCOUNTER — Encounter (HOSPITAL_COMMUNITY): Payer: BC Managed Care – PPO

## 2014-04-15 ENCOUNTER — Encounter (HOSPITAL_COMMUNITY): Payer: BC Managed Care – PPO

## 2014-04-20 ENCOUNTER — Encounter (HOSPITAL_COMMUNITY)
Admission: RE | Admit: 2014-04-20 | Discharge: 2014-04-20 | Disposition: A | Discharge status: Home / Self Care | Payer: BC Managed Care – PPO | Source: Ambulatory Visit | Attending: Cardiovascular Disease | Admitting: Cardiovascular Disease

## 2014-04-21 ENCOUNTER — Encounter (HOSPITAL_COMMUNITY): Payer: BC Managed Care – PPO

## 2014-04-22 ENCOUNTER — Encounter (HOSPITAL_COMMUNITY)
Admission: RE | Admit: 2014-04-22 | Discharge: 2014-04-22 | Disposition: A | Discharge status: Home / Self Care | Payer: BC Managed Care – PPO | Source: Ambulatory Visit | Attending: Cardiovascular Disease | Admitting: Cardiovascular Disease

## 2014-04-27 ENCOUNTER — Encounter (HOSPITAL_COMMUNITY)
Admission: RE | Admit: 2014-04-27 | Discharge: 2014-04-27 | Disposition: A | Discharge status: Home / Self Care | Payer: BC Managed Care – PPO | Source: Ambulatory Visit | Attending: Cardiovascular Disease | Admitting: Cardiovascular Disease

## 2014-04-28 ENCOUNTER — Encounter (HOSPITAL_COMMUNITY): Payer: BC Managed Care – PPO

## 2014-04-29 ENCOUNTER — Encounter (HOSPITAL_COMMUNITY)
Admission: RE | Admit: 2014-04-29 | Discharge: 2014-04-29 | Disposition: A | Discharge status: Home / Self Care | Payer: BC Managed Care – PPO | Source: Ambulatory Visit | Attending: Cardiovascular Disease | Admitting: Cardiovascular Disease

## 2014-05-04 ENCOUNTER — Encounter (HOSPITAL_COMMUNITY)
Admission: RE | Admit: 2014-05-04 | Discharge: 2014-05-04 | Disposition: A | Discharge status: Home / Self Care | Payer: Self-pay | Source: Ambulatory Visit | Attending: Cardiovascular Disease | Admitting: Cardiovascular Disease

## 2014-05-04 DIAGNOSIS — E663 Overweight: Secondary | ICD-10-CM | POA: Insufficient documentation

## 2014-05-04 DIAGNOSIS — R0600 Dyspnea, unspecified: Secondary | ICD-10-CM | POA: Insufficient documentation

## 2014-05-04 DIAGNOSIS — I119 Hypertensive heart disease without heart failure: Secondary | ICD-10-CM | POA: Insufficient documentation

## 2014-05-04 DIAGNOSIS — E119 Type 2 diabetes mellitus without complications: Secondary | ICD-10-CM | POA: Insufficient documentation

## 2014-05-04 DIAGNOSIS — Z5189 Encounter for other specified aftercare: Secondary | ICD-10-CM | POA: Insufficient documentation

## 2014-05-05 ENCOUNTER — Encounter (HOSPITAL_COMMUNITY): Payer: Self-pay

## 2014-05-06 ENCOUNTER — Encounter (HOSPITAL_COMMUNITY)
Admission: RE | Admit: 2014-05-06 | Discharge: 2014-05-06 | Disposition: A | Discharge status: Home / Self Care | Payer: Self-pay | Source: Ambulatory Visit | Attending: Cardiovascular Disease | Admitting: Cardiovascular Disease

## 2014-05-11 ENCOUNTER — Encounter (HOSPITAL_COMMUNITY): Payer: Self-pay

## 2014-05-12 ENCOUNTER — Encounter (HOSPITAL_COMMUNITY): Payer: Self-pay

## 2014-05-13 ENCOUNTER — Encounter (HOSPITAL_COMMUNITY): Payer: Self-pay

## 2014-05-18 ENCOUNTER — Encounter (HOSPITAL_COMMUNITY)
Admission: RE | Admit: 2014-05-18 | Discharge: 2014-05-18 | Disposition: A | Discharge status: Home / Self Care | Payer: Self-pay | Source: Ambulatory Visit | Attending: Cardiovascular Disease | Admitting: Cardiovascular Disease

## 2014-05-19 ENCOUNTER — Encounter (HOSPITAL_COMMUNITY): Payer: Self-pay

## 2014-05-20 ENCOUNTER — Encounter (HOSPITAL_COMMUNITY)
Admission: RE | Admit: 2014-05-20 | Discharge: 2014-05-20 | Disposition: A | Discharge status: Home / Self Care | Payer: Self-pay | Source: Ambulatory Visit | Attending: Cardiovascular Disease | Admitting: Cardiovascular Disease

## 2014-05-25 ENCOUNTER — Encounter (HOSPITAL_COMMUNITY): Payer: Self-pay

## 2014-05-26 ENCOUNTER — Encounter (HOSPITAL_COMMUNITY)
Admission: RE | Admit: 2014-05-26 | Discharge: 2014-05-26 | Disposition: A | Discharge status: Home / Self Care | Payer: Self-pay | Source: Ambulatory Visit | Attending: Cardiovascular Disease | Admitting: Cardiovascular Disease

## 2014-05-27 ENCOUNTER — Encounter (HOSPITAL_COMMUNITY): Payer: Self-pay

## 2014-06-01 ENCOUNTER — Encounter (HOSPITAL_COMMUNITY): Payer: BLUE CROSS/BLUE SHIELD

## 2014-06-01 DIAGNOSIS — E663 Overweight: Secondary | ICD-10-CM | POA: Insufficient documentation

## 2014-06-01 DIAGNOSIS — E119 Type 2 diabetes mellitus without complications: Secondary | ICD-10-CM | POA: Insufficient documentation

## 2014-06-01 DIAGNOSIS — I119 Hypertensive heart disease without heart failure: Secondary | ICD-10-CM | POA: Insufficient documentation

## 2014-06-01 DIAGNOSIS — Z5189 Encounter for other specified aftercare: Secondary | ICD-10-CM | POA: Insufficient documentation

## 2014-06-01 DIAGNOSIS — R0600 Dyspnea, unspecified: Secondary | ICD-10-CM | POA: Insufficient documentation

## 2014-06-02 ENCOUNTER — Encounter (HOSPITAL_COMMUNITY): Payer: Self-pay

## 2014-06-03 ENCOUNTER — Encounter (HOSPITAL_COMMUNITY)
Admission: RE | Admit: 2014-06-03 | Discharge: 2014-06-03 | Disposition: A | Discharge status: Home / Self Care | Payer: Self-pay | Source: Ambulatory Visit | Attending: Cardiovascular Disease | Admitting: Cardiovascular Disease

## 2014-06-08 ENCOUNTER — Encounter (HOSPITAL_COMMUNITY)
Admission: RE | Admit: 2014-06-08 | Discharge: 2014-06-08 | Disposition: A | Discharge status: Home / Self Care | Payer: Self-pay | Source: Ambulatory Visit | Attending: Cardiovascular Disease | Admitting: Cardiovascular Disease

## 2014-06-09 ENCOUNTER — Encounter (HOSPITAL_COMMUNITY): Payer: Self-pay

## 2014-06-10 ENCOUNTER — Encounter (HOSPITAL_COMMUNITY)
Admission: RE | Admit: 2014-06-10 | Discharge: 2014-06-10 | Disposition: A | Discharge status: Home / Self Care | Payer: Self-pay | Source: Ambulatory Visit | Attending: Cardiovascular Disease | Admitting: Cardiovascular Disease

## 2014-06-15 ENCOUNTER — Encounter (HOSPITAL_COMMUNITY)
Admission: RE | Admit: 2014-06-15 | Discharge: 2014-06-15 | Disposition: A | Discharge status: Home / Self Care | Payer: Self-pay | Source: Ambulatory Visit | Attending: Cardiovascular Disease | Admitting: Cardiovascular Disease

## 2014-06-16 ENCOUNTER — Encounter (HOSPITAL_COMMUNITY): Payer: Self-pay

## 2014-06-17 ENCOUNTER — Encounter (HOSPITAL_COMMUNITY)
Admission: RE | Admit: 2014-06-17 | Discharge: 2014-06-17 | Disposition: A | Discharge status: Home / Self Care | Payer: Self-pay | Source: Ambulatory Visit | Attending: Cardiovascular Disease | Admitting: Cardiovascular Disease

## 2014-06-22 ENCOUNTER — Encounter (HOSPITAL_COMMUNITY)
Admission: RE | Admit: 2014-06-22 | Discharge: 2014-06-22 | Disposition: A | Discharge status: Home / Self Care | Payer: Self-pay | Source: Ambulatory Visit | Attending: Cardiovascular Disease | Admitting: Cardiovascular Disease

## 2014-06-23 ENCOUNTER — Encounter (HOSPITAL_COMMUNITY): Payer: Self-pay

## 2014-06-24 ENCOUNTER — Encounter (HOSPITAL_COMMUNITY): Payer: Self-pay

## 2014-07-01 ENCOUNTER — Encounter (HOSPITAL_COMMUNITY)
Admission: RE | Admit: 2014-07-01 | Discharge: 2014-07-01 | Disposition: A | Discharge status: Home / Self Care | Payer: Self-pay | Source: Ambulatory Visit | Attending: Cardiovascular Disease | Admitting: Cardiovascular Disease

## 2014-07-01 DIAGNOSIS — E663 Overweight: Secondary | ICD-10-CM | POA: Insufficient documentation

## 2014-07-01 DIAGNOSIS — I119 Hypertensive heart disease without heart failure: Secondary | ICD-10-CM | POA: Insufficient documentation

## 2014-07-01 DIAGNOSIS — Z5189 Encounter for other specified aftercare: Secondary | ICD-10-CM | POA: Insufficient documentation

## 2014-07-01 DIAGNOSIS — R0600 Dyspnea, unspecified: Secondary | ICD-10-CM | POA: Insufficient documentation

## 2014-07-01 DIAGNOSIS — E119 Type 2 diabetes mellitus without complications: Secondary | ICD-10-CM | POA: Insufficient documentation

## 2014-07-06 ENCOUNTER — Telehealth: Payer: Self-pay | Admitting: *Deleted

## 2014-07-06 ENCOUNTER — Encounter (HOSPITAL_COMMUNITY): Payer: Self-pay

## 2014-07-06 NOTE — Telephone Encounter (Signed)
Faxed cardiac rehab order to [facility] @ [phone].

## 2014-07-07 ENCOUNTER — Encounter (HOSPITAL_COMMUNITY): Payer: Self-pay

## 2014-07-08 ENCOUNTER — Encounter (HOSPITAL_COMMUNITY): Payer: Self-pay

## 2014-07-13 ENCOUNTER — Encounter (HOSPITAL_COMMUNITY)
Admission: RE | Admit: 2014-07-13 | Discharge: 2014-07-13 | Disposition: A | Discharge status: Home / Self Care | Payer: Self-pay | Source: Ambulatory Visit | Attending: Cardiovascular Disease | Admitting: Cardiovascular Disease

## 2014-07-14 ENCOUNTER — Encounter (HOSPITAL_COMMUNITY): Payer: Self-pay

## 2014-07-15 ENCOUNTER — Encounter (HOSPITAL_COMMUNITY)
Admission: RE | Admit: 2014-07-15 | Discharge: 2014-07-15 | Disposition: A | Discharge status: Home / Self Care | Payer: Self-pay | Source: Ambulatory Visit | Attending: Cardiovascular Disease | Admitting: Cardiovascular Disease

## 2014-07-20 ENCOUNTER — Encounter (HOSPITAL_COMMUNITY)
Admission: RE | Admit: 2014-07-20 | Discharge: 2014-07-20 | Disposition: A | Discharge status: Home / Self Care | Payer: Self-pay | Source: Ambulatory Visit | Attending: Cardiovascular Disease | Admitting: Cardiovascular Disease

## 2014-07-21 ENCOUNTER — Encounter (HOSPITAL_COMMUNITY): Payer: Self-pay

## 2014-07-22 ENCOUNTER — Encounter (HOSPITAL_COMMUNITY)
Admission: RE | Admit: 2014-07-22 | Discharge: 2014-07-22 | Disposition: A | Discharge status: Home / Self Care | Payer: Self-pay | Source: Ambulatory Visit | Attending: Cardiovascular Disease | Admitting: Cardiovascular Disease

## 2014-07-27 ENCOUNTER — Encounter (HOSPITAL_COMMUNITY)
Admission: RE | Admit: 2014-07-27 | Discharge: 2014-07-27 | Disposition: A | Discharge status: Home / Self Care | Payer: Self-pay | Source: Ambulatory Visit | Attending: Cardiovascular Disease | Admitting: Cardiovascular Disease

## 2014-07-28 ENCOUNTER — Encounter (HOSPITAL_COMMUNITY): Payer: Self-pay

## 2014-07-29 ENCOUNTER — Encounter (HOSPITAL_COMMUNITY)
Admission: RE | Admit: 2014-07-29 | Discharge: 2014-07-29 | Disposition: A | Discharge status: Home / Self Care | Payer: Self-pay | Source: Ambulatory Visit | Attending: Cardiovascular Disease | Admitting: Cardiovascular Disease

## 2014-08-03 ENCOUNTER — Encounter (HOSPITAL_COMMUNITY)
Admission: RE | Admit: 2014-08-03 | Discharge: 2014-08-03 | Disposition: A | Discharge status: Home / Self Care | Payer: Medicare PPO | Source: Ambulatory Visit | Attending: Cardiovascular Disease | Admitting: Cardiovascular Disease

## 2014-08-03 DIAGNOSIS — R0600 Dyspnea, unspecified: Secondary | ICD-10-CM | POA: Insufficient documentation

## 2014-08-03 DIAGNOSIS — Z5189 Encounter for other specified aftercare: Secondary | ICD-10-CM | POA: Insufficient documentation

## 2014-08-03 DIAGNOSIS — I119 Hypertensive heart disease without heart failure: Secondary | ICD-10-CM | POA: Diagnosis not present

## 2014-08-03 DIAGNOSIS — E119 Type 2 diabetes mellitus without complications: Secondary | ICD-10-CM | POA: Diagnosis not present

## 2014-08-03 DIAGNOSIS — E663 Overweight: Secondary | ICD-10-CM | POA: Diagnosis not present

## 2014-08-04 ENCOUNTER — Encounter (HOSPITAL_COMMUNITY): Payer: Medicare PPO

## 2014-08-05 ENCOUNTER — Other Ambulatory Visit: Payer: Self-pay | Admitting: Physician Assistant

## 2014-08-05 ENCOUNTER — Encounter (HOSPITAL_COMMUNITY)
Admission: RE | Admit: 2014-08-05 | Discharge: 2014-08-05 | Disposition: A | Discharge status: Home / Self Care | Payer: Medicare PPO | Source: Ambulatory Visit | Attending: Cardiovascular Disease | Admitting: Cardiovascular Disease

## 2014-08-05 ENCOUNTER — Telehealth: Payer: Self-pay | Admitting: Cardiovascular Disease

## 2014-08-05 ENCOUNTER — Encounter: Payer: Self-pay | Admitting: *Deleted

## 2014-08-05 ENCOUNTER — Ambulatory Visit (HOSPITAL_COMMUNITY)
Admission: RE | Admit: 2014-08-05 | Discharge: 2014-08-05 | Disposition: A | Discharge status: Home / Self Care | Payer: Medicare PPO | Source: Ambulatory Visit | Attending: Cardiovascular Disease | Admitting: Cardiovascular Disease

## 2014-08-05 DIAGNOSIS — I119 Hypertensive heart disease without heart failure: Secondary | ICD-10-CM | POA: Diagnosis not present

## 2014-08-05 DIAGNOSIS — I442 Atrioventricular block, complete: Secondary | ICD-10-CM

## 2014-08-05 DIAGNOSIS — R0600 Dyspnea, unspecified: Secondary | ICD-10-CM | POA: Diagnosis not present

## 2014-08-05 DIAGNOSIS — E119 Type 2 diabetes mellitus without complications: Secondary | ICD-10-CM | POA: Diagnosis not present

## 2014-08-05 DIAGNOSIS — Z5189 Encounter for other specified aftercare: Secondary | ICD-10-CM | POA: Diagnosis not present

## 2014-08-05 NOTE — Assessment & Plan Note (Signed)
Stop toprol.  The patient will go directly to the office and get a Cardionet monitor and TSH checked.  Appt made on the Flex schedule on Monday at 0900hrs.  If it does not resolve, he will need a PPM.  If he is symptomatic in way, he will go directly to the ER.

## 2014-08-05 NOTE — Telephone Encounter (Signed)
Tony [name] has a critical EKG for there pt

## 2014-08-05 NOTE — Progress Notes (Signed)
Date:  08/05/2014   ID:  Tony [name], DOB Sep 10, 1950, MRN [id number]  PCP:  No primary care provider on file.  Primary Cardiologist:  Claiborne Billings   CC:  EKG changes in cardiac rehab   History of Present Illness: Tony [name] is a 64 y.o. male with a history of HTN, DM, hypothyroidism, GERD, RBBB.  HE was at cardiac rehab and HR was in the 40's.  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  Reviewed with Dr. Sallyanne Kuster.  Asymptomatic.   The patient currently denies nausea, vomiting, fever, chest pain, shortness of breath, orthopnea, dizziness, PND, cough, congestion, abdominal pain, hematochezia, melena, lower extremity edema, claudication.  Wt Readings from Last 3 Encounters:  04/24/12 262 lb (118.842 kg)     Past Medical History  Diagnosis Date  . Hypertension   . Complication of anesthesia     1985 after Thyriodectomy hard time waking up  . Diabetes mellitus     taking meds  . Hypothyroidism     Had two surgeries for Cancer  . GERD (gastroesophageal reflux disease)     on meds  . Cancer     thyroid    Current Outpatient Prescriptions  Medication Sig Dispense Refill  . amLODipine (NORVASC) 10 MG tablet Take 5 mg by mouth daily.     Marland Kitchen aspirin EC 81 MG tablet Take 81 mg by mouth daily.    Marland Kitchen atorvastatin (LIPITOR) 40 MG tablet Take 1 tablet (40 mg total) by mouth daily. 30 tablet 6  . cyclobenzaprine (FLEXERIL) 10 MG tablet Take 10 mg by mouth 3 (three) times daily as needed. For muscle pain.    . fish oil-omega-3 fatty acids 1000 MG capsule Take 1 g by mouth daily.     Marland Kitchen glipiZIDE (GLUCOTROL XL) 10 MG 24 hr tablet Take 5 mg by mouth daily.     Marland Kitchen HYDROcodone-acetaminophen (NORCO) 10-325 MG per tablet Take 1 tablet by mouth every 6 (six) hours as needed.    Marland Kitchen ibuprofen (ADVIL,MOTRIN) 800 MG tablet Take 800 mg by mouth every 8 (eight) hours as needed. For pain.    Marland Kitchen levothyroxine (SYNTHROID, LEVOTHROID) 150 MCG tablet Take 150 mcg by mouth daily.     . metFORMIN  (GLUCOPHAGE) 500 MG tablet Take 500 mg by mouth 2 (two) times daily with a meal.    . metoprolol succinate (TOPROL-XL) 25 MG 24 hr tablet TAKE 1 TABLET (25 MG TOTAL) BY MOUTH DAILY. <PLEASE MAKE APPOINTMENT FOR FUTURE REFILLS> 15 tablet 0  . omeprazole (PRILOSEC) 20 MG capsule Take 20 mg by mouth daily as needed. For acid reflux    . Potassium Chloride (KLOR-CON 10 PO) Take 10 mEq by mouth daily.    . pravastatin (PRAVACHOL) 20 MG tablet Take 20 mg by mouth daily.    . quinapril-hydrochlorothiazide (ACCURETIC) 20-12.5 MG per tablet Take 1 tablet by mouth daily.    . quinapril-hydrochlorothiazide (ACCURETIC) 20-25 MG per tablet Take 1 tablet by mouth daily.    . sertraline (ZOLOFT) 50 MG tablet Take 100 mg by mouth daily.     . Tamsulosin HCl (FLOMAX) 0.4 MG CAPS Take 1 capsule (0.4 mg total) by mouth every morning. 30 capsule 0   No current facility-administered medications for this visit.    Allergies:    Allergies  Allergen Reactions  . Crestor [Rosuvastatin Calcium] Other (See Comments)    Leg pain  . Simvastatin Other (See Comments)    Leg pain    Social  History:  The patient  reports that he has never smoked. He does not have any smokeless tobacco history on file. He reports that he does not drink alcohol or use illicit drugs.   Family history:   Family History  Problem Relation Age of Onset  . Anesthesia problems Neg Hx   . Hypotension Neg Hx   . Pseudochol deficiency Neg Hx     ROS:  Please see the history of present illness.  All other systems reviewed and negative.   PHYSICAL EXAM: VS:  BP 130/76, HR 44,  Well nourished, well developed, in no acute distress HEENT: Pupils are equal round react to light accommodation extraocular movements are intact.  Neck: no JVDNo cervical lymphadenopathy. Cardiac: Regular rate and rhythm with 1/6 sys MM. Lungs:  clear to auscultation bilaterally, no wheezing, rhonchi or rales Abd: soft, nontender, positive bowel sounds all  quadrants Ext: no lower extremity edema.  2+ radial and dorsalis pedis pulses. Skin: warm and dry Neuro:  Grossly normal  EKG:  Complete  HB, rate 44.    ASSESSMENT AND PLAN:  Problem List Items Addressed This Visit    Complete heart block - Primary    Stop toprol.  The patient will go directly to the office and get a Cardionet monitor and TSH checked.  Appt made on the Flex schedule on Monday at 0900hrs.  If it does not resolve, he will need a PPM.  If he is symptomatic in way, he will go directly to the ER.        Relevant Orders   Cardiac event monitor   TSH

## 2014-08-05 NOTE — Telephone Encounter (Signed)
--  Took call from [facility] approx 12:35pm Pt had been at Cardiac Rehab this AM, noted slow HR on pulse ox.  EKG & BP check performed, EKG showed 2nd deg AV block, Tarri Fuller was in hospital, came to observe patient. Advised d/c of beta blocker, sent to [place] at [facility] for monitor placement & labwork.  Pt was advised to report to ER for lightheadedness/dizziness.  Verdis Frederickson informed me she is faxing sheets to our office for Dr. Evette Georges attn.   --Took call from Nikolai at [employer] approx 1:00pm Prevent[employer] monitor applied at [facility] this morning. Carol from monitoring service called to inform that pt showed 3rd deg AV block. She is faxing report to [place]. She was unable to get in touch w/ patient at phone #s provided.   I attempted to reach pt at phone numbers provided. "Home" number disconnected, "mobile" number gets busy signal (attempted x2), "work" number I did not get an answer from & does not direct to a voice mailbox for patient. Pt has already been given instructions on what to do in light of worsening symptoms.   Routing to [employer]

## 2014-08-05 NOTE — Progress Notes (Signed)
Patient noticed that his heart rate was 44 during exercise at cardiac rehab maintenance using the pulse oximeter. I checked Mr Andel's heart rate during exercise. Heart noted at 76 on the airdyne via pulse oximeter. After Mr Derwin completed exercise I placed the patient on the [place]. Rhythm appears to be Wenckebach. Blood pressure 130/76. Patient asymptomatic. Luisa Dago PAC called and notified. . 12 lead interpretation second degree heart block Mobitz 1. Luisa Dago PAC came to cardiac rehab to evaluate patient.  Patient placed on oxygen at 3L/min. Oxygen saturation 95% on room air. Oxygen saturation 100% on 2l/min. Dr Sallyanne Kuster reviewed 12 lead ECG. Patient's wife and daughter who is an anesthesiologist called and notified of events. Patient has been set up to get a 30 day monitor. Mr Otten's betablocker has been discontinued.  Mr Nogueira's wife and son came to take the patient to [facility] heart care to get his monitor and get lab work drawn. Patient instructed to go to the Emergency room if he experiences any lightheadedness or dizziness by Luisa Dago PAC. CBG checked 178. Oxygen discontinued. Recheck blood pressure 118/76 sitting. Oxygen saturation 99% on room air.  I reviewed Mr Card's medications with his son Tommi Rumps who brought in his father's medication bottles from home. Ignacio left cardiac rehab without complaints or symptoms with his wife. Will fax exercise flow sheets to Dr. Evette Georges  office for review with today's [employer]ings and exercise flow sheet from cardiac rehab maintenance.

## 2014-08-05 NOTE — Telephone Encounter (Signed)
Dr. Percival Spanish advised no addtl recommendations. Fwded to [name] for [place].

## 2014-08-05 NOTE — Progress Notes (Signed)
Patient ID: Tony [name], male   DOB: 1950-12-28, 64 y.o.   MRN: [id number] [employer] 30 day cardiac event monitor applied to patient.

## 2014-08-06 LAB — GLUCOSE, CAPILLARY: GLUCOSE-CAPILLARY: 178 mg/dL — AB (ref 70–99)

## 2014-08-09 ENCOUNTER — Encounter: Payer: Self-pay | Admitting: Cardiology

## 2014-08-09 ENCOUNTER — Ambulatory Visit (INDEPENDENT_AMBULATORY_CARE_PROVIDER_SITE_OTHER): Payer: Medicare PPO | Admitting: Cardiology

## 2014-08-09 ENCOUNTER — Ambulatory Visit (HOSPITAL_COMMUNITY): Payer: Medicare PPO | Attending: Cardiology | Admitting: Radiology

## 2014-08-09 VITALS — BP 138/70 | HR 64 | Ht 72.0 in | Wt 259.4 lb

## 2014-08-09 DIAGNOSIS — I350 Nonrheumatic aortic (valve) stenosis: Secondary | ICD-10-CM | POA: Insufficient documentation

## 2014-08-09 DIAGNOSIS — I358 Other nonrheumatic aortic valve disorders: Secondary | ICD-10-CM | POA: Insufficient documentation

## 2014-08-09 DIAGNOSIS — I442 Atrioventricular block, complete: Secondary | ICD-10-CM

## 2014-08-09 DIAGNOSIS — I10 Essential (primary) hypertension: Secondary | ICD-10-CM | POA: Diagnosis not present

## 2014-08-09 DIAGNOSIS — I340 Nonrheumatic mitral (valve) insufficiency: Secondary | ICD-10-CM | POA: Insufficient documentation

## 2014-08-09 DIAGNOSIS — E039 Hypothyroidism, unspecified: Secondary | ICD-10-CM

## 2014-08-09 LAB — CBC WITH DIFFERENTIAL/PLATELET
Basophils Absolute: 0 10*3/uL (ref 0.0–0.1)
Basophils Relative: 0.4 % (ref 0.0–3.0)
EOS PCT: 1 % (ref 0.0–5.0)
Eosinophils Absolute: 0.1 10*3/uL (ref 0.0–0.7)
HCT: 42 % (ref 39.0–52.0)
Hemoglobin: 14.3 g/dL (ref 13.0–17.0)
LYMPHS PCT: 24.7 % (ref 12.0–46.0)
Lymphs Abs: 1.9 10*3/uL (ref 0.7–4.0)
MCHC: 34 g/dL (ref 30.0–36.0)
MCV: 91 fl (ref 78.0–100.0)
MONO ABS: 0.7 10*3/uL (ref 0.1–1.0)
MONOS PCT: 9.5 % (ref 3.0–12.0)
NEUTROS PCT: 64.4 % (ref 43.0–77.0)
Neutro Abs: 5 10*3/uL (ref 1.4–7.7)
PLATELETS: 219 10*3/uL (ref 150.0–400.0)
RBC: 4.62 Mil/uL (ref 4.22–5.81)
RDW: 14.6 % (ref 11.5–15.5)
WBC: 7.7 10*3/uL (ref 4.0–10.5)

## 2014-08-09 LAB — BASIC METABOLIC PANEL
BUN: 20 mg/dL (ref 6–23)
CHLORIDE: 101 meq/L (ref 96–112)
CO2: 30 meq/L (ref 19–32)
CREATININE: 1.23 mg/dL (ref 0.40–1.50)
Calcium: 9.6 mg/dL (ref 8.4–10.5)
GFR: 62.76 mL/min (ref >60.00)
Glucose, Bld: 135 mg/dL — ABNORMAL HIGH (ref 70–99)
Potassium: 3.4 mEq/L — ABNORMAL LOW (ref 3.5–5.1)
Sodium: 137 mEq/L (ref 135–145)

## 2014-08-09 LAB — TSH: TSH: 5.2 u[IU]/mL — AB (ref 0.35–4.50)

## 2014-08-09 LAB — MAGNESIUM: Magnesium: 2 mg/dL (ref 1.5–2.5)

## 2014-08-09 LAB — T4, FREE: Free T4: 0.83 ng/dL (ref 0.60–1.60)

## 2014-08-09 NOTE — Progress Notes (Signed)
Echocardiogram performed.  

## 2014-08-09 NOTE — Patient Instructions (Signed)
Your physician recommends that you continue on your current medications as directed. Please refer to the Current Medication list given to you today.   Your physician recommends that you have labs today. BMET, CBC, TSH, MAG, T3 , AND T4  Your physician has recommended that you have a pacemaker inserted. A pacemaker is a small device that is placed under the skin of your chest or abdomen to help control abnormal heart rhythms. This device uses electrical pulses to prompt the heart to beat at a normal rate. Pacemakers are used to treat heart rhythms that are too slow. Wire (leads) are attached to the pacemaker that goes into the chambers of you heart. This is done in the hospital and usually requires and overnight stay. Please see the instruction sheet given to you today for more information.  Your physician has requested that you have an echocardiogram BEFORE Wednesday. Echocardiography is a painless test that uses sound waves to create images of your heart. It provides your doctor with information about the size and shape of your heart and how well your heart's chambers and valves are working. This procedure takes approximately one hour. There are no restrictions for this procedure.    Pacemaker Implantation The heart has its own electrical system, or natural pacemaker, to regulate the heartbeat. Sometimes, the natural pacemaker system of the heart fails and causes the heart to beat too slowly. If this happens, a pacemaker can be surgically placed to help the heart beat at a normal or programmed rate. A pacemaker is a small, battery-powered device that is placed under the skin and is programmed to sense your heartbeats. If your heart rate is lower than the programmed rate, the pacemaker will pace your heart. Parts of a pacemaker include:  Wires or leads. The leads are placed in the heart and transmit electricity to the heart. The leads are connected to the pulse generator.  Pulse generator. The pulse  generator contains a computer and a memory system. The pulse generator also produces the electrical signal that triggers the heart to beat. A pacemaker may be placed if:  You have a slow heartbeat (bradycardia).  You have fainting (syncope).  Shortness of breath (dyspnea) due to heart problems. LET [facility] CARE PROVIDER KNOW ABOUT:  Any allergies you may have.  All medicines you are taking, including vitamins, herbs, eye drops, creams, and over-the-counter medicines.  Previous problems you or members of your family have had with the use of anesthetics.  Any blood disorders you have.  Previous surgeries you have had.  Medical conditions you have.  Possibility of pregnancy, if this applies. RISKS AND COMPLICATIONS Generally, pacemaker implantation is a safe procedure. However, problems can occur and include:  Bleeding.  Unable to place the pacemaker under local sedation.  Infection. BEFORE THE PROCEDURE  You will have blood work drawn before the procedure.  Do not use any tobacco products including cigarettes, chewing tobacco, or electronic cigarettes. If you need help quitting, ask your health care provider.  Do not eat or drink anything after midnight on the night before the procedure or as directed by your health care provider.  Ask your health care provider about:  Changing or stopping your regular medicines. This is especially important if you are taking diabetes medicines or blood thinners.  Taking medicines such as aspirin and ibuprofen. These medicines can thin your blood. Do not take these medicines before your procedure if your health care provider asks you not to.  Ask your health  care provider if you can take a sip of water with any approved medicines the morning of the procedure. PROCEDURE  The surgery to place a pacemaker is considered a minimally invasive surgical procedure. It is done under a local anesthetic, which is an injection at the incision site  that makes the skin numb. You are also given sedation and pain medicine that makes you drowsy during the procedure.   An intravenous line (IV) will be started in your hand or arm so sedation and pain medicine can be given during the pacemaker procedure.  A numbing medicine will be injected into the skin where the pacemaker is to be placed. A small incision will then be made into the skin. The pacemaker is usually placed under the skin near the collarbone.  After the incision has been made, the leads will be inserted into a large vein and guided into the heart using X-ray.  Using the same incision that was used to place the leads, a small pocket will be created under the skin to hold the pulse generator. The leads will then be connected to the pulse generator.  The incision site will then be closed. A bandage (dressing) is placed over the pacemaker site. The dressing is removed 24-48 hours afterward. AFTER THE PROCEDURE  You will be taken to a recovery area after the pacemaker implant. Your vital signs such as blood pressure, heart rate, breathing, and oxygen levels will be monitored.  A chest X-ray will be done after the pacemaker has been implanted. This is to make sure the pacemaker and leads are in the correct place. Document Released: 04/06/2002 Document Revised: 08/31/2013 Document Reviewed: 08/21/2011 [employer] [facility] Patient Information 2015 [place], [place]. This information is not intended to replace advice given to you by your health care provider. Make sure you discuss any questions you have with your health care provider.

## 2014-08-09 NOTE — Progress Notes (Signed)
08/09/2014 Tony [name]   14-Feb-1951  [id number]  Primary Physician Tony Argyle, MD Primary Cardiologist: Dr. Claiborne [name]  Reason for Visit: Heart Block/ Bradycardia  HPI:  The patient is a 64 y/o male seen in the past by Dr. Claiborne [name]. He has a h/o DM, HTN, hypothryodism and OSA compliant with CPAP theray. He also has a h/o chronic RBBB and PVCs, for which he was presbribed BB therapy. In 2013, as part of pre-op w/u for back surgery, NST showed no ischemia. 2D echo also showed normal LVF with EF of 55-60%. He has not seen Dr. Claiborne [name] since that time. Dr. Felipa [name] follows him medically.  Over the last 2 years, he has been enrolled in the Cardiac Wellness/ Cardiac Rehab program at [facility]. He self referred himself for preventative purposes given his multiple cardiac risk factors. 1 week ago, he was noted to have bradycardia on HR monitor with rate in the 40s. An EKG was ordered and demonstrated CHB. He was asymptomatic at the time. Decision was made to d/c his metoprolol and further evaluate with ambulatory cardiac monitoring.   He presents back to clinic today. He notes dizziness but denies syncope/near syncope, dyspnea and chest pain. Despite discontinuation of his metoprolol, EKG today demonstrates CHB. HR 45 bpm.    Current Outpatient Prescriptions  Medication Sig Dispense Refill  . acetaminophen (TYLENOL) 500 MG tablet Take 500 mg by mouth every 6 (six) hours as needed.    Marland Kitchen amLODipine (NORVASC) 10 MG tablet Take 5 mg by mouth daily.     Marland Kitchen aspirin EC 81 MG tablet Take 81 mg by mouth daily.    Marland Kitchen atorvastatin (LIPITOR) 10 MG tablet Take 10 mg by mouth daily.    . cyclobenzaprine (FLEXERIL) 10 MG tablet Take 10 mg by mouth 3 (three) times daily as needed. For muscle pain.    . fish oil-omega-3 fatty acids 1000 MG capsule Take 1 g by mouth daily.     Marland Kitchen glipiZIDE (GLUCOTROL XL) 10 MG 24 hr tablet Take 10 mg by mouth daily.     . hydrochlorothiazide (HYDRODIURIL) 25 MG tablet Take 25 mg by  mouth daily.    Marland Kitchen HYDROcodone-acetaminophen (NORCO) 10-325 MG per tablet Take 1 tablet by mouth every 6 (six) hours as needed.    Marland Kitchen ibuprofen (ADVIL,MOTRIN) 800 MG tablet Take 800 mg by mouth every 8 (eight) hours as needed. For pain.    Marland Kitchen levothyroxine (SYNTHROID, LEVOTHROID) 200 MCG tablet Take 200 mcg by mouth daily.    . metFORMIN (GLUCOPHAGE) 500 MG tablet Take 1,000 mg by mouth 2 (two) times daily with a meal.     . omeprazole (PRILOSEC) 20 MG capsule Take 20 mg by mouth daily as needed. For acid reflux    . quinapril (ACCUPRIL) 20 MG tablet Take 20 mg by mouth at bedtime. Patient takes one and a half tablets once a day    . sertraline (ZOLOFT) 100 MG tablet Take 100 mg by mouth daily.    . Tamsulosin HCl (FLOMAX) 0.4 MG CAPS Take 1 capsule (0.4 mg total) by mouth every morning. 30 capsule 0   No current facility-administered medications for this visit.    Allergies  Allergen Reactions  . Crestor [Rosuvastatin Calcium] Other (See Comments)    Leg pain  . Simvastatin Other (See Comments)    Leg pain    History   Social History  . Marital Status: Married    Spouse Name: Tony [name]  . Number of Children: 2  .  Years of Education: college   Occupational History  . retired    Social History Main Topics  . Smoking status: Never Smoker   . Smokeless tobacco: Not on file  . Alcohol Use: No  . Drug Use: No  . Sexual Activity: Not on file   Other Topics Concern  . Not on file   Social History Narrative     Review of Systems: General: negative for chills, fever, night sweats or weight changes.  Cardiovascular: negative for chest pain, dyspnea on exertion, edema, orthopnea, palpitations, paroxysmal nocturnal dyspnea or shortness of breath Dermatological: negative for rash Respiratory: negative for cough or wheezing Urologic: negative for hematuria Abdominal: negative for nausea, vomiting, diarrhea, bright red blood per rectum, melena, or hematemesis Neurologic: negative for  visual changes, syncope, or dizziness All other systems reviewed and are otherwise negative except as noted above.    Blood pressure 138/70, pulse 64, height 6' (1.829 m), weight 259 lb 6.4 oz (117.663 kg), SpO2 94 %.  General appearance: alert, cooperative and no distress Neck: no carotid bruit and no JVD Lungs: clear to auscultation bilaterally Heart: bradycardic. no murmurs Extremities: no LEE Pulses: 2+ and symmetric Skin: warm and dry Neurologic: Grossly normal  EKG CHB 45 bpm   ASSESSMENT AND PLAN:   1. CHB: complains of dizziness but no syncope/ near syncope. He has been off all AV nodal blocking agents for 1 week. He has been evaluated by Dr. Rayann [name] who also reviewed his EKG. Will plan for PPM insertion 08/11/14 with Dr. Rayann [name]. Will check 2D echo prior to PPM. Will also check TSH, Mg and K. He has been instructed no driving until PPM. Also advised to hold ASA until after procedure.   2. H/O Frequent PVCs: continue to hold metoprolol given CHB. Can resume after PPM implantation.    PLAN  PPM insertion by Dr. Rayann [name] has been scheduled for 08/11/14.   [name], [name] 08/09/2014 9:16 AM

## 2014-08-10 ENCOUNTER — Encounter (HOSPITAL_COMMUNITY): Admission: RE | Admit: 2014-08-10 | Payer: Medicare PPO | Source: Ambulatory Visit

## 2014-08-10 ENCOUNTER — Telehealth (HOSPITAL_COMMUNITY): Payer: Self-pay | Admitting: *Deleted

## 2014-08-10 DIAGNOSIS — E119 Type 2 diabetes mellitus without complications: Secondary | ICD-10-CM | POA: Diagnosis not present

## 2014-08-10 DIAGNOSIS — E039 Hypothyroidism, unspecified: Secondary | ICD-10-CM | POA: Diagnosis not present

## 2014-08-10 DIAGNOSIS — G4733 Obstructive sleep apnea (adult) (pediatric): Secondary | ICD-10-CM | POA: Diagnosis not present

## 2014-08-10 DIAGNOSIS — Z7982 Long term (current) use of aspirin: Secondary | ICD-10-CM | POA: Diagnosis not present

## 2014-08-10 DIAGNOSIS — I10 Essential (primary) hypertension: Secondary | ICD-10-CM | POA: Diagnosis not present

## 2014-08-10 DIAGNOSIS — Z79899 Other long term (current) drug therapy: Secondary | ICD-10-CM | POA: Diagnosis not present

## 2014-08-10 DIAGNOSIS — I442 Atrioventricular block, complete: Secondary | ICD-10-CM | POA: Diagnosis not present

## 2014-08-10 LAB — T3: T3, Total: 60.3 ng/dL — ABNORMAL LOW (ref 80.0–204.0)

## 2014-08-10 MED ORDER — CHLORHEXIDINE GLUCONATE 4 % EX LIQD
60.0000 mL | Freq: Once | CUTANEOUS | Status: DC
  Filled 2014-08-10: qty 60

## 2014-08-10 MED ORDER — SODIUM CHLORIDE 0.9 % IV SOLN
INTRAVENOUS | Status: DC
  Administered 2014-08-11: 07:00:00 via INTRAVENOUS

## 2014-08-10 MED ORDER — CEFAZOLIN SODIUM-DEXTROSE 2-3 GM-% IV SOLR: 2.0000 g | INTRAVENOUS | Status: DC

## 2014-08-10 MED ORDER — SODIUM CHLORIDE 0.9 % IR SOLN
80.0000 mg | Status: DC
  Filled 2014-08-10: qty 2

## 2014-08-11 ENCOUNTER — Encounter (HOSPITAL_COMMUNITY): Payer: Self-pay | Admitting: *Deleted

## 2014-08-11 ENCOUNTER — Ambulatory Visit (HOSPITAL_COMMUNITY)
Admission: RE | Admit: 2014-08-11 | Discharge: 2014-08-12 | Disposition: A | Discharge status: Home / Self Care | Payer: Medicare PPO | Source: Ambulatory Visit | Attending: Internal Medicine | Admitting: Internal Medicine

## 2014-08-11 ENCOUNTER — Encounter (HOSPITAL_COMMUNITY): Payer: Medicare PPO

## 2014-08-11 ENCOUNTER — Encounter (HOSPITAL_COMMUNITY)
Admission: RE | Disposition: A | Discharge status: Home / Self Care | Payer: Self-pay | Source: Ambulatory Visit | Attending: Internal Medicine

## 2014-08-11 DIAGNOSIS — G4733 Obstructive sleep apnea (adult) (pediatric): Secondary | ICD-10-CM | POA: Insufficient documentation

## 2014-08-11 DIAGNOSIS — Z7982 Long term (current) use of aspirin: Secondary | ICD-10-CM | POA: Insufficient documentation

## 2014-08-11 DIAGNOSIS — I442 Atrioventricular block, complete: Secondary | ICD-10-CM | POA: Diagnosis present

## 2014-08-11 DIAGNOSIS — E119 Type 2 diabetes mellitus without complications: Secondary | ICD-10-CM | POA: Insufficient documentation

## 2014-08-11 DIAGNOSIS — E039 Hypothyroidism, unspecified: Secondary | ICD-10-CM | POA: Insufficient documentation

## 2014-08-11 DIAGNOSIS — Z959 Presence of cardiac and vascular implant and graft, unspecified: Secondary | ICD-10-CM

## 2014-08-11 DIAGNOSIS — Z79899 Other long term (current) drug therapy: Secondary | ICD-10-CM | POA: Insufficient documentation

## 2014-08-11 DIAGNOSIS — I10 Essential (primary) hypertension: Secondary | ICD-10-CM | POA: Insufficient documentation

## 2014-08-11 HISTORY — PX: PERMANENT PACEMAKER INSERTION: SHX5480

## 2014-08-11 LAB — GLUCOSE, CAPILLARY
GLUCOSE-CAPILLARY: 162 mg/dL — AB (ref 70–99)
GLUCOSE-CAPILLARY: 148 mg/dL — AB (ref 70–99)
Glucose-Capillary: 241 mg/dL — ABNORMAL HIGH (ref 70–99)

## 2014-08-11 LAB — SURGICAL PCR SCREEN
MRSA, PCR: NEGATIVE
STAPHYLOCOCCUS AUREUS: NEGATIVE

## 2014-08-11 SURGERY — PERMANENT PACEMAKER INSERTION
Anesthesia: LOCAL

## 2014-08-11 MED ORDER — SODIUM CHLORIDE 0.9 % IV SOLN: 250.0000 mL | INTRAVENOUS | Status: DC | PRN

## 2014-08-11 MED ORDER — HEPARIN (PORCINE) IN NACL 2-0.9 UNIT/ML-% IJ SOLN
INTRAMUSCULAR | Status: AC
  Filled 2014-08-11: qty 500

## 2014-08-11 MED ORDER — LIDOCAINE HCL (PF) 1 % IJ SOLN
INTRAMUSCULAR | Status: AC
  Filled 2014-08-11: qty 30

## 2014-08-11 MED ORDER — TAMSULOSIN HCL 0.4 MG PO CAPS
0.4000 mg | ORAL_CAPSULE | Freq: Every morning | ORAL | Status: DC
  Administered 2014-08-12: 0.4 mg via ORAL
  Filled 2014-08-11: qty 1

## 2014-08-11 MED ORDER — ONDANSETRON HCL 4 MG/2ML IJ SOLN
4.0000 mg | Freq: Four times a day (QID) | INTRAMUSCULAR | Status: DC | PRN
Start: 2014-08-11 — End: 2014-08-12

## 2014-08-11 MED ORDER — CEFAZOLIN SODIUM 1-5 GM-% IV SOLN
1.0000 g | Freq: Four times a day (QID) | INTRAVENOUS | Status: AC
  Administered 2014-08-11 – 2014-08-12 (×3): 1 g via INTRAVENOUS
  Filled 2014-08-11 (×3): qty 50

## 2014-08-11 MED ORDER — SODIUM CHLORIDE 0.9 % IJ SOLN
3.0000 mL | Freq: Two times a day (BID) | INTRAMUSCULAR | Status: DC
  Administered 2014-08-11 – 2014-08-12 (×2): 3 mL via INTRAVENOUS

## 2014-08-11 MED ORDER — METFORMIN HCL 500 MG PO TABS
1000.0000 mg | ORAL_TABLET | Freq: Two times a day (BID) | ORAL | Status: DC
  Administered 2014-08-12: 1000 mg via ORAL
  Filled 2014-08-11 (×4): qty 2

## 2014-08-11 MED ORDER — LEVOTHYROXINE SODIUM 200 MCG PO TABS
200.0000 ug | ORAL_TABLET | Freq: Every day | ORAL | Status: DC
  Administered 2014-08-12: 200 ug via ORAL
  Filled 2014-08-11 (×2): qty 1

## 2014-08-11 MED ORDER — HYDROCODONE-ACETAMINOPHEN 10-325 MG PO TABS
1.0000 | ORAL_TABLET | Freq: Four times a day (QID) | ORAL | Status: DC | PRN
  Administered 2014-08-11 – 2014-08-12 (×2): 1 via ORAL
  Filled 2014-08-11 (×2): qty 1

## 2014-08-11 MED ORDER — SODIUM CHLORIDE 0.9 % IJ SOLN: 3.0000 mL | INTRAMUSCULAR | Status: DC | PRN

## 2014-08-11 MED ORDER — FENTANYL CITRATE 0.05 MG/ML IJ SOLN
INTRAMUSCULAR | Status: AC
  Filled 2014-08-11: qty 2

## 2014-08-11 MED ORDER — GLIPIZIDE ER 10 MG PO TB24
10.0000 mg | ORAL_TABLET | Freq: Every day | ORAL | Status: DC
  Administered 2014-08-12: 10 mg via ORAL
  Filled 2014-08-11 (×2): qty 1

## 2014-08-11 MED ORDER — MUPIROCIN 2 % EX OINT
1.0000 "application " | TOPICAL_OINTMENT | Freq: Once | CUTANEOUS | Status: AC
  Administered 2014-08-11: 1 via TOPICAL
  Filled 2014-08-11: qty 22

## 2014-08-11 MED ORDER — AMLODIPINE BESYLATE 5 MG PO TABS
5.0000 mg | ORAL_TABLET | Freq: Every day | ORAL | Status: DC
  Administered 2014-08-11 – 2014-08-12 (×2): 5 mg via ORAL
  Filled 2014-08-11 (×2): qty 1

## 2014-08-11 MED ORDER — QUINAPRIL HCL 10 MG PO TABS: 20.0000 mg | ORAL_TABLET | Freq: Every day | ORAL | Status: DC

## 2014-08-11 MED ORDER — MIDAZOLAM HCL 5 MG/5ML IJ SOLN
INTRAMUSCULAR | Status: AC
  Filled 2014-08-11: qty 5

## 2014-08-11 MED ORDER — ACETAMINOPHEN 325 MG PO TABS
325.0000 mg | ORAL_TABLET | ORAL | Status: DC | PRN
  Administered 2014-08-11 – 2014-08-12 (×3): 650 mg via ORAL
  Filled 2014-08-11 (×3): qty 2

## 2014-08-11 MED ORDER — CYCLOBENZAPRINE HCL 10 MG PO TABS: 10.0000 mg | ORAL_TABLET | Freq: Three times a day (TID) | ORAL | Status: DC | PRN

## 2014-08-11 MED ORDER — LIDOCAINE HCL (PF) 1 % IJ SOLN
INTRAMUSCULAR | Status: AC
  Filled 2014-08-11: qty 60

## 2014-08-11 MED ORDER — SERTRALINE HCL 100 MG PO TABS
100.0000 mg | ORAL_TABLET | Freq: Every day | ORAL | Status: DC
Start: 2014-08-11 — End: 2014-08-12
  Administered 2014-08-11 – 2014-08-12 (×2): 100 mg via ORAL
  Filled 2014-08-11 (×2): qty 1

## 2014-08-11 MED ORDER — HYDROCHLOROTHIAZIDE 25 MG PO TABS
25.0000 mg | ORAL_TABLET | Freq: Every day | ORAL | Status: DC
  Administered 2014-08-11 – 2014-08-12 (×2): 25 mg via ORAL
  Filled 2014-08-11 (×2): qty 1

## 2014-08-11 MED ORDER — MUPIROCIN 2 % EX OINT
TOPICAL_OINTMENT | CUTANEOUS | Status: AC
Start: 2014-08-11 — End: 2014-08-11
  Filled 2014-08-11: qty 22

## 2014-08-11 MED ORDER — LISINOPRIL 20 MG PO TABS
20.0000 mg | ORAL_TABLET | Freq: Every day | ORAL | Status: DC
  Administered 2014-08-11: 20 mg via ORAL
  Filled 2014-08-11 (×2): qty 1

## 2014-08-11 NOTE — Discharge Summary (Signed)
ELECTROPHYSIOLOGY PROCEDURE DISCHARGE SUMMARY    Patient ID: Tony [name],  MRN: [id number], DOB/AGE: [birth year] 65 y.o.  Admit date: 08/11/2014 Discharge date: 08/12/2014  Primary Care Physician: [name], MD Primary Cardiologist: Claiborne Billings Electrophysiologist: Donnavan Covault  Primary Discharge Diagnosis:  Symptomatic complete heart block status post pacemaker implantation this admission  Secondary Discharge Diagnosis:  1.  Diabetes 2.  Hypertension 3.  Hypothyroidism 4.  Sleep apnea - on CPAP 5.  Chronic RBBB  Allergies  Allergen Reactions  . Crestor [Rosuvastatin Calcium] Other (See Comments)    Leg pain  . Simvastatin Other (See Comments)    Leg pain     Procedures This Admission:  1.  Implantation of a MDT dual chamber PPM on 08-11-14 by Dr [name].  The patient received a MDT model number ADDRL1 PPM with model number 5076 right atrial lead and 5076 right ventricular lead. There were no immediate post procedure complications. 2.  CXR on 08-12-14 demonstrated no pneumothorax status post device implantation.   Brief HPI: Tony [name] is a 64 y.o. male was referred to electrophysiology in the outpatient setting for consideration of PPM implantation.  Past medical history is as above.  He was recently found to have complete heart block in cardiac rehab.  He has no reversible causes.  Risks, benefits, and alternatives to PPM implantation were reviewed with the patient who wished to proceed.   Hospital Course:  The patient was admitted and underwent implantation of a MDT dual chamber pacemaker with details as outlined above.  He  was monitored on telemetry overnight which demonstrated sinus rhythm with ventricular pacing.  Left chest was without hematoma or ecchymosis.  The device was interrogated and found to be functioning normally.  CXR was obtained and demonstrated no pneumothorax status post device implantation.  Wound care, arm mobility, and restrictions were  reviewed with the patient.  The patient was examined and considered stable for discharge to home.    Physical Exam: Filed Vitals:   08/11/14 1230 08/11/14 1300 08/11/14 2127 08/12/14 0544  BP: 153/77 138/65 147/68 155/81  Pulse: 60 60 60 60  Temp:   98.5 F (36.9 C) 97.9 F (36.6 C)  TempSrc:   Oral Oral  Resp:   18 18  Height:      Weight:      SpO2:   97% 97%    GEN- The patient is well appearing, alert and oriented x 3 today.   HEENT: normocephalic, atraumatic; sclera clear, conjunctiva pink; hearing intact; oropharynx clear; neck supple, no JVP Lymph- no cervical lymphadenopathy Lungs- Clear to ausculation bilaterally, normal work of breathing.  No wheezes, rales, rhonchi Heart- Regular rate and rhythm, no murmurs, rubs or gallops, PMI not laterally displaced GI- soft, non-tender, non-distended, bowel sounds present, no hepatosplenomegaly Extremities- no clubbing, cyanosis, or edema; DP/PT/radial pulses 2+ bilaterally MS- no significant deformity or atrophy Skin- warm and dry, no rash or lesion, left chest without hematoma/ecchymosis Psych- euthymic mood, full affect Neuro- strength and sensation are intact   Labs:   Lab Results  Component Value Date   WBC 7.7 08/09/2014   HGB 14.3 08/09/2014   HCT 42.0 08/09/2014   MCV 91.0 08/09/2014   PLT 219.0 08/09/2014     Recent Labs Lab 08/09/14 1350  NA 137  K 3.4*  CL 101  CO2 30  BUN 20  CREATININE 1.23  CALCIUM 9.6  GLUCOSE 135*    Discharge Medications:    Medication List  TAKE these medications        acetaminophen 500 MG tablet  Commonly known as:  TYLENOL  Take 500 mg by mouth every 6 (six) hours as needed.     amLODipine 10 MG tablet  Commonly known as:  NORVASC  Take 5 mg by mouth daily.     aspirin EC 81 MG tablet  Take 81 mg by mouth daily.     atorvastatin 10 MG tablet  Commonly known as:  LIPITOR  Take 10 mg by mouth daily.     cyclobenzaprine 10 MG tablet  Commonly known as:   FLEXERIL  Take 10 mg by mouth 3 (three) times daily as needed. For muscle pain.     fish oil-omega-3 fatty acids 1000 MG capsule  Take 1 g by mouth daily.     glipiZIDE 10 MG 24 hr tablet  Commonly known as:  GLUCOTROL XL  Take 10 mg by mouth daily.     hydrochlorothiazide 25 MG tablet  Commonly known as:  HYDRODIURIL  Take 25 mg by mouth daily.     HYDROcodone-acetaminophen 10-325 MG per tablet  Commonly known as:  NORCO  Take 1 tablet by mouth every 6 (six) hours as needed.     ibuprofen 800 MG tablet  Commonly known as:  ADVIL,MOTRIN  Take 800 mg by mouth every 8 (eight) hours as needed. For pain.     levothyroxine 200 MCG tablet  Commonly known as:  SYNTHROID, LEVOTHROID  Take 200 mcg by mouth daily.     metFORMIN 500 MG tablet  Commonly known as:  GLUCOPHAGE  Take 1,000 mg by mouth 2 (two) times daily with a meal.     omeprazole 20 MG capsule  Commonly known as:  PRILOSEC  Take 20 mg by mouth daily as needed. For acid reflux     quinapril 20 MG tablet  Commonly known as:  ACCUPRIL  Take 20 mg by mouth at bedtime. Patient takes one and a half tablets once a day     sertraline 100 MG tablet  Commonly known as:  ZOLOFT  Take 100 mg by mouth daily.     tamsulosin 0.4 MG Caps capsule  Commonly known as:  FLOMAX  Take 1 capsule (0.4 mg total) by mouth every morning.        Disposition:  Discharge Instructions    Diet - low sodium heart healthy    Complete by:  As directed      Increase activity slowly    Complete by:  As directed           Follow-up Information    Follow up with [facility] On 08/25/2014.   Why:  at 3:30PM for wound check   Contact information:   [address]   [id number]       Duration of Discharge Encounter: Greater than 30 minutes including physician time.  Signed, Chanetta Marshall, NP 08/12/2014 6:49 AM   [name] MD

## 2014-08-11 NOTE — Interval H&P Note (Signed)
History and Physical Interval Note:  08/11/2014 8:12 AM  The patient has symptomatic complete heart block with occasional dizziness and fatigue.  He is at risk for decompensation/ heart failure/ sudden death.  No reversible causes have been identified.  I would therefore recommend pacemaker implantation at this time.  Risks, benefits, alternatives to pacemaker implantation were discussed in detail with the patient today. The patient understands that the risks include but are not limited to bleeding, infection, pneumothorax, perforation, tamponade, vascular damage, renal failure, MI, stroke, death,  and lead dislodgement and wishes to proceed.   Tony [name]  has presented today for surgery, with the diagnosis of bradycardia  The various methods of treatment have been discussed with the patient and family. After consideration of risks, benefits and other options for treatment, the patient has consented to  Procedure(s): PERMANENT PACEMAKER INSERTION (N/A) as a surgical intervention .  The patient's history has been reviewed, patient examined, no change in status, stable for surgery.  I have reviewed the patient's chart and labs.  Questions were answered to the patient's satisfaction.     Tony [name]

## 2014-08-11 NOTE — Discharge Instructions (Signed)
° ° °  Supplemental Discharge Instructions for  Pacemaker/Defibrillator Patients  Activity No heavy lifting or vigorous activity with your left/right arm for 6 to 8 weeks.  Do not raise your left/right arm above your head for one week.  Gradually raise your affected arm as drawn below.           _        08-15-14                       08-16-14                  08-17-14                   4-20-16_  NO DRIVING for   1 week  ; you may begin driving on    5-00-93 .  WOUND CARE - Keep the wound area clean and dry.  Do not get this area wet for one week. No showers for one week; you may shower on 08-18-14    . - The tape/steri-strips on your wound will fall off; do not pull them off.  No bandage is needed on the site.  DO  NOT apply any creams, oils, or ointments to the wound area. - If you notice any drainage or discharge from the wound, any swelling or bruising at the site, or you develop a fever > 101? F after you are discharged home, call the office at once.  Special Instructions - You are still able to use cellular telephones; use the ear opposite the side where you have your pacemaker/defibrillator.  Avoid carrying your cellular phone near your device. - When traveling through airports, show security personnel your identification card to avoid being screened in the metal detectors.  Ask the security personnel to use the hand wand. - Avoid arc welding equipment, MRI testing (magnetic resonance imaging), TENS units (transcutaneous nerve stimulators).  Call the office for questions about other devices. - Avoid electrical appliances that are in poor condition or are not properly grounded. - Microwave ovens are safe to be near or to operate.

## 2014-08-11 NOTE — H&P (View-Only) (Signed)
08/09/2014 Tony [name]   [birth year]  [id number]  Primary Physician [name], MD Primary Cardiologist: [name]  Reason for Visit: Heart Block/ Bradycardia  HPI:  The patient is a 64 y/o male seen in the past by [name]. He has a h/o DM, HTN, hypothryodism and OSA compliant with CPAP theray. He also has a h/o chronic RBBB and PVCs, for which he was presbribed BB therapy. In 2013, as part of pre-op w/u for back surgery, NST showed no ischemia. 2D echo also showed normal LVF with EF of 55-60%. He has not seen [name] since that time. Dr. [name] follows him medically.  Over the last 2 years, he has been enrolled in the Cardiac Wellness/ Cardiac Rehab program at [facility]. He self referred himself for preventative purposes given his multiple cardiac risk factors. 1 week ago, he was noted to have bradycardia on HR monitor with rate in the 40s. An EKG was ordered and demonstrated CHB. He was asymptomatic at the time. Decision was made to d/c his metoprolol and further evaluate with ambulatory cardiac monitoring.   He presents back to clinic today. He notes dizziness but denies syncope/near syncope, dyspnea and chest pain. Despite discontinuation of his metoprolol, EKG today demonstrates CHB. HR 45 bpm.    Current Outpatient Prescriptions  Medication Sig Dispense Refill  . acetaminophen (TYLENOL) 500 MG tablet Take 500 mg by mouth every 6 (six) hours as needed.    Marland Kitchen amLODipine (NORVASC) 10 MG tablet Take 5 mg by mouth daily.     Marland Kitchen aspirin EC 81 MG tablet Take 81 mg by mouth daily.    Marland Kitchen atorvastatin (LIPITOR) 10 MG tablet Take 10 mg by mouth daily.    . cyclobenzaprine (FLEXERIL) 10 MG tablet Take 10 mg by mouth 3 (three) times daily as needed. For muscle pain.    . fish oil-omega-3 fatty acids 1000 MG capsule Take 1 g by mouth daily.     Marland Kitchen glipiZIDE (GLUCOTROL XL) 10 MG 24 hr tablet Take 10 mg by mouth daily.     . hydrochlorothiazide (HYDRODIURIL) 25 MG tablet Take 25 mg by  mouth daily.    Marland Kitchen HYDROcodone-acetaminophen (NORCO) 10-325 MG per tablet Take 1 tablet by mouth every 6 (six) hours as needed.    Marland Kitchen ibuprofen (ADVIL,MOTRIN) 800 MG tablet Take 800 mg by mouth every 8 (eight) hours as needed. For pain.    Marland Kitchen levothyroxine (SYNTHROID, LEVOTHROID) 200 MCG tablet Take 200 mcg by mouth daily.    . metFORMIN (GLUCOPHAGE) 500 MG tablet Take 1,000 mg by mouth 2 (two) times daily with a meal.     . omeprazole (PRILOSEC) 20 MG capsule Take 20 mg by mouth daily as needed. For acid reflux    . quinapril (ACCUPRIL) 20 MG tablet Take 20 mg by mouth at bedtime. Patient takes one and a half tablets once a day    . sertraline (ZOLOFT) 100 MG tablet Take 100 mg by mouth daily.    . Tamsulosin HCl (FLOMAX) 0.4 MG CAPS Take 1 capsule (0.4 mg total) by mouth every morning. 30 capsule 0   No current facility-administered medications for this visit.    Allergies  Allergen Reactions  . Crestor [Rosuvastatin Calcium] Other (See Comments)    Leg pain  . Simvastatin Other (See Comments)    Leg pain    History   Social History  . Marital Status: Married    Spouse Name: jody  . Number of Children: 2  .  Years of Education: college   Occupational History  . retired    Social History Main Topics  . Smoking status: Never Smoker   . Smokeless tobacco: Not on file  . Alcohol Use: No  . Drug Use: No  . Sexual Activity: Not on file   Other Topics Concern  . Not on file   Social History Narrative     Review of Systems: General: negative for chills, fever, night sweats or weight changes.  Cardiovascular: negative for chest pain, dyspnea on exertion, edema, orthopnea, palpitations, paroxysmal nocturnal dyspnea or shortness of breath Dermatological: negative for rash Respiratory: negative for cough or wheezing Urologic: negative for hematuria Abdominal: negative for nausea, vomiting, diarrhea, bright red blood per rectum, melena, or hematemesis Neurologic: negative for  visual changes, syncope, or dizziness All other systems reviewed and are otherwise negative except as noted above.    Blood pressure 138/70, pulse 64, height 6' (1.829 m), weight 259 lb 6.4 oz (117.663 kg), SpO2 94 %.  General appearance: alert, cooperative and no distress Neck: no carotid bruit and no JVD Lungs: clear to auscultation bilaterally Heart: bradycardic. no murmurs Extremities: no LEE Pulses: 2+ and symmetric Skin: warm and dry Neurologic: Grossly normal  EKG CHB 45 bpm   ASSESSMENT AND PLAN:   1. CHB: complains of dizziness but no syncope/ near syncope. He has been off all AV nodal blocking agents for 1 week. He has been evaluated by Dr. [name] who also reviewed his EKG. Will plan for PPM insertion 08/11/14 with Dr. [name]. Will check 2D echo prior to PPM. Will also check TSH, Mg and K. He has been instructed no driving until PPM. Also advised to hold ASA until after procedure.   2. H/O Frequent PVCs: continue to hold metoprolol given CHB. Can resume after PPM implantation.    PLAN  PPM insertion by Dr. [name] has been scheduled for 08/11/14.   SIMMONS, [name] 08/09/2014 9:16 AM

## 2014-08-11 NOTE — Op Note (Signed)
SURGEON:  [name], MD     PREPROCEDURE DIAGNOSIS:  Symptomatic complete heart block    POSTPROCEDURE DIAGNOSIS:  Symptomatic complete heart block     PROCEDURES:   1. Pacemaker implantation.     INTRODUCTION: Tony [name] is a 64 y.o. male  with a history of complete heart block who presents today for pacemaker implantation.  The patient reports intermittent episodes of dizziness and fatigue over the past few days.  He has had heart rates 30s at rehab and ekg which reveals complete heart block.  No reversible causes have been identified.  The patient therefore presents today for pacemaker implantation.     DESCRIPTION OF PROCEDURE:  Informed written consent was obtained, and the patient was brought to the electrophysiology lab in a fasting state.  The patient required no sedation for the procedure today.  The patients left chest was prepped and draped in the usual sterile fashion by the EP lab staff. The skin overlying the left deltopectoral region was infiltrated with lidocaine for local analgesia.  A 4-cm incision was made over the left deltopectoral region.  A left subcutaneous pacemaker pocket was fashioned using a combination of sharp and blunt dissection. Electrocautery was required to assure hemostasis.    RA/RV Lead Placement: The left axillary vein was therefore cannulated.  No contrast was required.  Through the left axillary vein, a Medtronic model [id number] (serial number [id number]) right atrial lead and a Medtronic model 5076- 58 (serial number [id number]) right ventricular lead were advanced with fluoroscopic visualization into the right atrial appendage and right ventricular apex positions respectively.  Initial atrial lead P- waves measured 3 mV with impedance of 638 ohms and a threshold of 0.6 V at 0.5 msec.  Right ventricular lead R-waves measured 10 mV with an impedance of 1200 ohms and a threshold of 1 V at 0.5 msec.  Both leads were secured to the pectoralis fascia using  #2-0 silk over the suture sleeves.   Device Placement:  The leads were then connected to a Medtronic Adapta L model ADDRL 1 (serial number [id number]) pacemaker.  The pocket was irrigated with copious gentamicin solution.  The pacemaker was then placed into the pocket.  The pocket was then closed in 2 layers with 2.0 Vicryl suture for the subcutaneous and subcuticular layers.  Steri- Strips and a sterile dressing were then applied. EBL<81ml. There were no early apparent complications.  No contrast or sedation were required for the procedure today.    CONCLUSIONS:   1. Successful implantation of a Medtronic Adapta L dual-chamber pacemaker for symptomatic complete heart block  2. No early apparent complications.     Permanent Pacemaker Indication: Documented non-reversible symptomatic bradycardia due to second degree and/or third degree atrioventricular block.        [name], MD 08/11/2014 11:23 AM

## 2014-08-12 ENCOUNTER — Ambulatory Visit (HOSPITAL_COMMUNITY): Payer: Medicare PPO

## 2014-08-12 ENCOUNTER — Encounter (HOSPITAL_COMMUNITY): Payer: Medicare PPO

## 2014-08-12 DIAGNOSIS — I10 Essential (primary) hypertension: Secondary | ICD-10-CM | POA: Diagnosis not present

## 2014-08-12 DIAGNOSIS — E039 Hypothyroidism, unspecified: Secondary | ICD-10-CM | POA: Diagnosis not present

## 2014-08-12 DIAGNOSIS — I442 Atrioventricular block, complete: Secondary | ICD-10-CM

## 2014-08-12 DIAGNOSIS — E119 Type 2 diabetes mellitus without complications: Secondary | ICD-10-CM | POA: Diagnosis not present

## 2014-08-12 LAB — GLUCOSE, CAPILLARY: Glucose-Capillary: 178 mg/dL — ABNORMAL HIGH (ref 70–99)

## 2014-08-12 NOTE — Progress Notes (Signed)
Discharge education completed by RN. Pt and spouse received a copy of discharge paperwork and confirm understanding of follow up appointments and discharge medications. Both deny any questions at this time. IV removed, site is within normal limits. Pt will discharge from the unit via wheelchair. Pts spouse will drive pt home.

## 2014-08-16 ENCOUNTER — Telehealth: Payer: Self-pay | Admitting: Neurology

## 2014-08-16 ENCOUNTER — Telehealth: Payer: Self-pay

## 2014-08-16 NOTE — Telephone Encounter (Signed)
OK to print or fax this recommendation to Dr. Werner Lean?

## 2014-08-16 NOTE — Telephone Encounter (Addendum)
Patient's wife is calling in regard to Rx Dilantin 100 mg 2 tablets morning, 2 noon, and 3 in the evening. Patient will be having blood work done this afternoon at 4:15 by Dr. Werner Lean, [place].  He would like to check the Dilantin levels. Patient needs a letter stating that it is OK for him to run this test. Thanks!

## 2014-08-16 NOTE — Telephone Encounter (Signed)
Note opened in error.

## 2014-08-16 NOTE — Telephone Encounter (Signed)
Please find out whose patient he is, I am not sure. thanks

## 2014-08-16 NOTE — Telephone Encounter (Signed)
This message was put under the wrong patient.

## 2014-08-17 ENCOUNTER — Encounter (HOSPITAL_COMMUNITY): Payer: Medicare PPO

## 2014-08-18 ENCOUNTER — Encounter (HOSPITAL_COMMUNITY): Payer: Medicare PPO

## 2014-08-19 ENCOUNTER — Encounter (HOSPITAL_COMMUNITY): Payer: Medicare PPO

## 2014-08-24 ENCOUNTER — Encounter (HOSPITAL_COMMUNITY): Payer: Medicare PPO

## 2014-08-25 ENCOUNTER — Encounter: Payer: Self-pay | Admitting: *Deleted

## 2014-08-25 ENCOUNTER — Encounter (HOSPITAL_COMMUNITY): Payer: Medicare PPO

## 2014-08-25 ENCOUNTER — Ambulatory Visit (INDEPENDENT_AMBULATORY_CARE_PROVIDER_SITE_OTHER): Payer: Medicare PPO | Admitting: *Deleted

## 2014-08-25 DIAGNOSIS — Z950 Presence of cardiac pacemaker: Secondary | ICD-10-CM

## 2014-08-25 DIAGNOSIS — I442 Atrioventricular block, complete: Secondary | ICD-10-CM | POA: Diagnosis not present

## 2014-08-26 ENCOUNTER — Telehealth: Payer: Self-pay | Admitting: Cardiovascular Disease

## 2014-08-26 ENCOUNTER — Encounter (HOSPITAL_COMMUNITY): Payer: Medicare PPO

## 2014-08-26 LAB — MDC_IDC_ENUM_SESS_TYPE_INCLINIC
Battery Impedance: 100 Ohm
Battery Voltage: 2.79 V
Brady Statistic AP VP Percent: 24 %
Brady Statistic AP VS Percent: 0 %
Brady Statistic AS VP Percent: 75 %
Brady Statistic AS VS Percent: 1 %
Date Time Interrogation Session: 20160427185158
Lead Channel Impedance Value: 463 Ohm
Lead Channel Pacing Threshold Amplitude: 0.5 V
Lead Channel Pacing Threshold Amplitude: 0.75 V
Lead Channel Pacing Threshold Pulse Width: 0.4 ms
Lead Channel Pacing Threshold Pulse Width: 0.4 ms
Lead Channel Sensing Intrinsic Amplitude: 4 mV
Lead Channel Setting Pacing Amplitude: 3.5 V
Lead Channel Setting Pacing Pulse Width: 0.4 ms
Lead Channel Setting Sensing Sensitivity: 4 mV
MDC IDC MSMT BATTERY REMAINING LONGEVITY: 123 mo
MDC IDC MSMT LEADCHNL RV IMPEDANCE VALUE: 805 Ohm
MDC IDC MSMT LEADCHNL RV SENSING INTR AMPL: 15.67 mV
MDC IDC SET LEADCHNL RA PACING AMPLITUDE: 3.5 V

## 2014-08-26 NOTE — Progress Notes (Signed)
Wound check appointment. Steri-strips removed. Wound without redness or edema. Incision edges approximated, wound well healed. Normal device function. Thresholds, sensing, and impedances consistent with implant measurements. Device programmed at 3.5V/auto capture programmed on for extra safety margin until 3 month visit. Histogram distribution appropriate for patient and level of activity. 5 mode switches--- <0.1%. No high ventricular rates noted. Patient educated about wound care, arm mobility, lifting restrictions. ROV w/ JA 11/24/14.

## 2014-08-26 NOTE — Telephone Encounter (Signed)
Closed encounter °

## 2014-08-31 ENCOUNTER — Encounter: Payer: Self-pay | Admitting: Cardiovascular Disease

## 2014-08-31 ENCOUNTER — Encounter (HOSPITAL_COMMUNITY): Payer: Self-pay

## 2014-08-31 ENCOUNTER — Ambulatory Visit (INDEPENDENT_AMBULATORY_CARE_PROVIDER_SITE_OTHER): Payer: Medicare PPO | Admitting: Cardiovascular Disease

## 2014-08-31 VITALS — BP 110/72 | HR 61 | Ht 72.0 in | Wt 251.1 lb

## 2014-08-31 DIAGNOSIS — I442 Atrioventricular block, complete: Secondary | ICD-10-CM

## 2014-08-31 DIAGNOSIS — Z5189 Encounter for other specified aftercare: Secondary | ICD-10-CM | POA: Insufficient documentation

## 2014-08-31 DIAGNOSIS — E1140 Type 2 diabetes mellitus with diabetic neuropathy, unspecified: Secondary | ICD-10-CM | POA: Diagnosis not present

## 2014-08-31 DIAGNOSIS — G4733 Obstructive sleep apnea (adult) (pediatric): Secondary | ICD-10-CM

## 2014-08-31 DIAGNOSIS — I119 Hypertensive heart disease without heart failure: Secondary | ICD-10-CM | POA: Insufficient documentation

## 2014-08-31 DIAGNOSIS — E1142 Type 2 diabetes mellitus with diabetic polyneuropathy: Secondary | ICD-10-CM

## 2014-08-31 DIAGNOSIS — I350 Nonrheumatic aortic (valve) stenosis: Secondary | ICD-10-CM

## 2014-08-31 DIAGNOSIS — I10 Essential (primary) hypertension: Secondary | ICD-10-CM

## 2014-08-31 DIAGNOSIS — E890 Postprocedural hypothyroidism: Secondary | ICD-10-CM

## 2014-08-31 DIAGNOSIS — E663 Overweight: Secondary | ICD-10-CM | POA: Insufficient documentation

## 2014-08-31 DIAGNOSIS — R0600 Dyspnea, unspecified: Secondary | ICD-10-CM | POA: Insufficient documentation

## 2014-08-31 DIAGNOSIS — E119 Type 2 diabetes mellitus without complications: Secondary | ICD-10-CM | POA: Insufficient documentation

## 2014-08-31 DIAGNOSIS — Z9989 Dependence on other enabling machines and devices: Secondary | ICD-10-CM

## 2014-08-31 NOTE — Patient Instructions (Signed)
Your physician wants you to follow-up in: 6 months or sooner if needed. You will receive a reminder letter in the mail two months in advance. If you don't receive a letter, please call our office to schedule the follow-up appointment. 

## 2014-09-01 ENCOUNTER — Encounter (HOSPITAL_COMMUNITY): Payer: Medicare PPO

## 2014-09-01 ENCOUNTER — Encounter: Payer: Self-pay | Admitting: Cardiovascular Disease

## 2014-09-01 DIAGNOSIS — E1142 Type 2 diabetes mellitus with diabetic polyneuropathy: Secondary | ICD-10-CM | POA: Insufficient documentation

## 2014-09-01 DIAGNOSIS — E119 Type 2 diabetes mellitus without complications: Secondary | ICD-10-CM | POA: Insufficient documentation

## 2014-09-01 DIAGNOSIS — I152 Hypertension secondary to endocrine disorders: Secondary | ICD-10-CM | POA: Insufficient documentation

## 2014-09-01 DIAGNOSIS — G4733 Obstructive sleep apnea (adult) (pediatric): Secondary | ICD-10-CM | POA: Insufficient documentation

## 2014-09-01 DIAGNOSIS — I10 Essential (primary) hypertension: Secondary | ICD-10-CM | POA: Insufficient documentation

## 2014-09-01 DIAGNOSIS — Z9989 Dependence on other enabling machines and devices: Secondary | ICD-10-CM | POA: Insufficient documentation

## 2014-09-01 DIAGNOSIS — E1159 Type 2 diabetes mellitus with other circulatory complications: Secondary | ICD-10-CM | POA: Insufficient documentation

## 2014-09-01 DIAGNOSIS — I350 Nonrheumatic aortic (valve) stenosis: Secondary | ICD-10-CM | POA: Insufficient documentation

## 2014-09-01 DIAGNOSIS — E039 Hypothyroidism, unspecified: Secondary | ICD-10-CM | POA: Insufficient documentation

## 2014-09-01 NOTE — Progress Notes (Signed)
Patient ID: Tony [name], male   DOB: [birth year], 64 y.o.   MRN: [id number]     HPI: Tony [name] is a 64 y.o. male who presents to the office today for a 26 month follow up cardiology evaluation.   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  Prior to retiring.  He was working for [employer].  Has a long-standing history of hypertension, diabetes mellitus type 2, hyperlipidemia, hypothyroidism, peripheral neuropathy, obstructive sleep apnea on CPAP therapy.  I last saw him in February 2014.  He has a history of PVCs and had been on low dose beta blocker therapy with suppression.  Since he has retired, he has continued to participate in the cardiac wellness program.  An echo Doppler study in 2013 showed an ejection fraction of 55-60%.  He has chronic right bundle branch block.  Proximally, one month ago, he was found to have the cardia on heart rate monitor at rehabilitation.  An ECG demonstrated complete heart block.  He was seen by Tony [name] in the office.  He complained of dizziness but did not have syncope.  He had been off all AV nodal blocking agents for a week.  He is referred to Tony [name] and underwent implantation of a MDT dual-chamber permanent pacemaker on 08/11/2014.  An echo Doppler study prior to implantation revealed normal LV function with evidence for calcified aortic valve with mild-to-moderate stenosis.  There was mild left atrial enlargement.  He presents to the office today for evaluation.  Since his pacemaker was implanted, he is unaware of palpitations.  He denies any presyncope or syncope.  He denies chest pain.  He denies PND, orthopnea.  Past Medical History  Diagnosis Date  . Hypertension   . Complication of anesthesia     1985 after Thyriodectomy hard time waking up  . Diabetes mellitus     taking meds  . Hypothyroidism     Had two surgeries for Cancer  . GERD  (gastroesophageal reflux disease)     on meds  . Cancer     thyroid    Past Surgical History  Procedure Laterality Date  . Thyroidectomy x2    . Lumbar laminectomy/decompression microdiscectomy  07/19/2011    Procedure: LUMBAR LAMINECTOMY/DECOMPRESSION MICRODISCECTOMY 3 LEVELS;  Surgeon: Melina Schools, MD;  Location: [facility];  Service: Orthopedics;  Laterality: Left;  Lumbar three-Lumbar five LEFT DECOMPRESSION AND FORAMINOTOMY Lumbar three-four LEFT DISCECTOMY  . Permanent pacemaker insertion N/A 08/11/2014    Procedure: PERMANENT PACEMAKER INSERTION;  Surgeon: Tony Grayer, MD;  Location: [facility] CATH LAB;  Service: Cardiovascular;  Laterality: N/A;    Allergies  Allergen Reactions  . Crestor [Rosuvastatin Calcium] Other (See Comments)    Leg pain  . Simvastatin Other (See Comments)    Leg pain    Current Outpatient Prescriptions  Medication Sig Dispense Refill  . acetaminophen (TYLENOL) 500 MG tablet Take 500 mg by mouth every 6 (six) hours as needed.    Marland Kitchen amLODipine (NORVASC) 10 MG tablet Take 5 mg by mouth daily.     Marland Kitchen aspirin EC 81 MG tablet Take 81 mg by mouth daily.    Marland Kitchen atorvastatin (LIPITOR) 10 MG tablet Take 10 mg by mouth daily.    . cyclobenzaprine (FLEXERIL) 10 MG tablet Take 10 mg by mouth 3 (three) times daily as needed. For muscle pain.    Marland Kitchen glipiZIDE (GLUCOTROL XL) 10 MG 24 hr  tablet Take 10 mg by mouth daily.     . hydrochlorothiazide (HYDRODIURIL) 25 MG tablet Take 25 mg by mouth daily.    Marland Kitchen HYDROcodone-acetaminophen (NORCO) 10-325 MG per tablet Take 1 tablet by mouth every 6 (six) hours as needed.    Marland Kitchen ibuprofen (ADVIL,MOTRIN) 800 MG tablet Take 800 mg by mouth every 8 (eight) hours as needed. For pain.    Marland Kitchen levothyroxine (SYNTHROID, LEVOTHROID) 200 MCG tablet Take 200 mcg by mouth daily.    . metFORMIN (GLUCOPHAGE) 500 MG tablet Take 1,000 mg by mouth 2 (two) times daily with a meal.     . mupirocin ointment (BACTROBAN) 2 % Apply 1 application topically daily.    Marland Kitchen  omeprazole (PRILOSEC) 20 MG capsule Take 20 mg by mouth daily as needed. For acid reflux    . quinapril (ACCUPRIL) 20 MG tablet Take 20 mg by mouth at bedtime. Patient takes one and a half tablets once a day    . sertraline (ZOLOFT) 100 MG tablet Take 100 mg by mouth daily.    . Tamsulosin HCl (FLOMAX) 0.4 MG CAPS Take 1 capsule (0.4 mg total) by mouth every morning. 30 capsule 0   No current facility-administered medications for this visit.    History   Social History  . Marital Status: Married    Spouse Name: jody  . Number of Children: 2  . Years of Education: college   Occupational History  . retired    Social History Main Topics  . Smoking status: Never Smoker   . Smokeless tobacco: Not on file  . Alcohol Use: No  . Drug Use: No  . Sexual Activity: Not on file   Other Topics Concern  . Not on file   Social History Narrative    Family History  Problem Relation Age of Onset  . Anesthesia problems Neg Hx   . Hypotension Neg Hx   . Pseudochol deficiency Neg Hx   . Lung cancer Mother   . Heart disease Father   . Heart attack Father     ROS General: Negative; No fevers, chills, or night sweats HEENT: Negative; No changes in vision or hearing, sinus congestion, difficulty swallowing Pulmonary: Negative; No cough, wheezing, shortness of breath, hemoptysis Cardiovascular: See HPI: No chest pain, presyncope, syncope, palpatations GI: Positive for occasional GERD GU: Negative; No dysuria, hematuria, or difficulty voiding Musculoskeletal: Negative; no myalgias, joint pain, or weakness Hematologic: Negative; no easy bruising, bleeding Endocrine: Positive for diabetes mellitus and hypothyroidism Neuro: Negative; no changes in balance, headaches Skin: Negative; No rashes or skin lesions Psychiatric: Negative; No behavioral problems, depression Sleep: Positive for obstructive sleep apnea on CPAP therapy No snoring,  daytime sleepiness, hypersomnolence, bruxism, restless  legs, hypnogognic hallucinations. Other comprehensive 14 point system review is negative   Physical Exam BP 110/72 mmHg  Pulse 61  Ht 6' (1.829 m)  Wt 251 lb 1.6 oz (113.898 kg)  BMI 34.05 kg/m2 Wt Readings from Last 3 Encounters:  08/31/14 251 lb 1.6 oz (113.898 kg)  08/11/14 260 lb (117.935 kg)  08/09/14 259 lb 6.4 oz (117.663 kg)   General: Alert, oriented, no distress.  Skin: normal turgor, no rashes, warm and dry HEENT: Normocephalic, atraumatic. Pupils equal round and reactive to light; sclera anicteric; extraocular muscles intact, No lid lag; Nose without nasal septal hypertrophy; Mouth/Parynx benign; Mallinpatti scale 3 Neck: No JVD, no carotid bruits; normal carotid upstroke Lungs: clear to ausculatation and percussion bilaterally; no wheezing or rales, normal inspiratory and expiratory  effort Chest wall: without tenderness to palpitation Heart: PMI not displaced, RRR, s1 s2 normal, 56/3 systolic murmur compatible with aortic stenosis; No diastolic murmur, no rubs, gallops, thrills, or heaves Abdomen: soft, nontender; no hepatosplenomehaly, BS+; abdominal aorta nontender and not dilated by palpation. Back: no CVA tenderness Pulses: 2+  Musculoskeletal: full range of motion, normal strength, no joint deformities Extremities: Pulses 2+, no clubbing cyanosis or edema, Homan's sign negative  Neurologic: grossly nonfocal; Cranial nerves grossly wnl Psychologic: Normal mood and affect   ECG (independently read by me): Appropriate atrial sensing and capture, 100% ventricular pacing  LABS:  BMP Latest Ref Rng 08/09/2014 07/18/2011  Glucose 70 - 99 mg/dL 135(H) 189(H)  BUN 6 - 23 mg/dL 20 17  Creatinine 0.40 - 1.50 mg/dL 1.23 0.99  Sodium 135 - 145 mEq/L 137 140  Potassium 3.5 - 5.1 mEq/L 3.4(L) 4.4  Chloride 96 - 112 mEq/L 101 99  CO2 19 - 32 mEq/L 30 30  Calcium 8.4 - 10.5 mg/dL 9.6 10.0     Hepatic Function Latest Ref Rng 07/18/2011  Total Protein 6.0 - 8.3 g/dL 7.6    Albumin 3.5 - 5.2 g/dL 4.1  AST 0 - 37 U/L 20  ALT 0 - 53 U/L 27  Alk Phosphatase 39 - 117 U/L 67  Total Bilirubin 0.3 - 1.2 mg/dL 1.2    CBC Latest Ref Rng 08/09/2014 07/18/2011  WBC 4.0 - 10.5 K/uL 7.7 10.0  Hemoglobin 13.0 - 17.0 g/dL 14.3 14.9  Hematocrit 39.0 - 52.0 % 42.0 42.8  Platelets 150.0 - 400.0 K/uL 219.0 229   Lab Results  Component Value Date   MCV 91.0 08/09/2014   MCV 84.8 07/18/2011    Lab Results  Component Value Date   TSH 5.20* 08/09/2014    BNP No results found for: BNP  ProBNP No results found for: PROBNP   Lipid Panel  No results found for: CHOL, TRIG, HDL, CHOLHDL, VLDL, LDLCALC, LDLDIRECT   RADIOLOGY: Dg Chest 2 View  08/12/2014   CLINICAL DATA:  64 year old male post pacer placement. Initial encounter.  EXAM: CHEST  2 VIEW  COMPARISON:  07/18/2011.  FINDINGS: Sequential pacemaker placed from the left with leads in the region of the right atrium and right ventricle. Heart size top-normal.  No pneumothorax.  Central pulmonary vascular prominence without pulmonary edema.  Mild elevation right hemidiaphragm unchanged. No segmental consolidation.  No plain film evidence of pulmonary malignancy.  IMPRESSION: Sequential pacemaker in place with leads in the region of the right atrium and right ventricle. No pneumothorax detected.   Electronically Signed   By: Genia Del M.D.   On: 08/12/2014 08:05     ASSESSMENT AND PLAN: Mr. Maretta Los is a 64 year old gentleman who has significant cardiovascular comorbidities including hypertension, type 2 diabetes mellitus, hyperlipidemia, and has been on CPAP therapy for significant obstructive sleep apnea.  He also has a history of hypothyroidism and peripheral neuropathy.  He did recently was found to develop complete heart block.  He status post permanent pacemaker.  His pacemaker appears to be functioning appropriately on ECG today.  His blood pressure today is stable on his current medical regimen consisting  of Accupril 20 mg, HCTZ 25 mg and amlodipine 10 mg.  He does not have significant edema.  He is diabetic on glipizide and metformin.  He has been taking high-dose levothyroxine 200 g and is status post thyroidectomy proximally 30 years ago.  He is unaware of any palpitations.  If he does  experience recurrent palpitations as he had in the past.  Camby restarted on low-dose beta blocker therapy if necessary.  I reviewed his hospitalization as well as his most recent echo.  He had normal systolic function with mild left ventricular hypertrophy.  There is also moderate aortic valve stenosis.  I reviewed his echo Doppler study from 2013 at which time his mean gradient was 10 mm and peak 78mHg and this has risen to 23 and 34, respectively on his most recent echo.  I will see him in 6 months for reevaluation or sooner if problems arise.  Time spent: 30 minutes   TTroy Sine MD, [facility] 09/01/2014 8:28 PM

## 2014-09-02 ENCOUNTER — Encounter (HOSPITAL_COMMUNITY): Payer: Self-pay

## 2014-09-06 ENCOUNTER — Encounter: Payer: Self-pay | Admitting: Internal Medicine

## 2014-09-07 ENCOUNTER — Encounter (HOSPITAL_COMMUNITY): Payer: Self-pay

## 2014-09-08 ENCOUNTER — Encounter (HOSPITAL_COMMUNITY): Payer: Self-pay

## 2014-09-09 ENCOUNTER — Encounter (HOSPITAL_COMMUNITY): Payer: Self-pay

## 2014-09-14 ENCOUNTER — Encounter (HOSPITAL_COMMUNITY): Payer: Self-pay

## 2014-09-15 ENCOUNTER — Encounter (HOSPITAL_COMMUNITY): Payer: Self-pay

## 2014-09-16 ENCOUNTER — Encounter (HOSPITAL_COMMUNITY)
Admission: RE | Admit: 2014-09-16 | Discharge: 2014-09-16 | Disposition: A | Discharge status: Home / Self Care | Payer: Self-pay | Source: Ambulatory Visit | Attending: Cardiovascular Disease | Admitting: Cardiovascular Disease

## 2014-09-20 ENCOUNTER — Telehealth: Payer: Self-pay | Admitting: Internal Medicine

## 2014-09-20 NOTE — Telephone Encounter (Signed)
F/u ° ° °Pt calling concerning previous message. Please call pt.  °

## 2014-09-20 NOTE — Telephone Encounter (Signed)
New Message  Pt called states that he has a coldness in his fingers on the left side near pacer and wants to make sure that he doesnt have a clot. Please call back to discuss

## 2014-09-20 NOTE — Telephone Encounter (Signed)
Has been doing great and yesterday his finger tips got cold after working in the yard and then again today after mowing the yard.  He mainly noticed it in fingers today and yesterday.  It got warm again in 15 min.  Discussed with Dr [name] and he feels it is unlikely he would have a clot.  Will continue to monitor and he will call if he gets worse

## 2014-09-21 ENCOUNTER — Encounter (HOSPITAL_COMMUNITY)
Admission: RE | Admit: 2014-09-21 | Discharge: 2014-09-21 | Disposition: A | Discharge status: Home / Self Care | Payer: Self-pay | Source: Ambulatory Visit | Attending: Cardiovascular Disease | Admitting: Cardiovascular Disease

## 2014-09-22 ENCOUNTER — Encounter (HOSPITAL_COMMUNITY): Payer: Self-pay

## 2014-09-23 ENCOUNTER — Encounter (HOSPITAL_COMMUNITY): Payer: Self-pay

## 2014-09-28 ENCOUNTER — Encounter (HOSPITAL_COMMUNITY): Payer: Self-pay

## 2014-09-29 ENCOUNTER — Encounter (HOSPITAL_COMMUNITY): Payer: Medicare PPO

## 2014-09-29 DIAGNOSIS — Z5189 Encounter for other specified aftercare: Secondary | ICD-10-CM | POA: Insufficient documentation

## 2014-09-29 DIAGNOSIS — I119 Hypertensive heart disease without heart failure: Secondary | ICD-10-CM | POA: Insufficient documentation

## 2014-09-29 DIAGNOSIS — E119 Type 2 diabetes mellitus without complications: Secondary | ICD-10-CM | POA: Insufficient documentation

## 2014-09-29 DIAGNOSIS — E663 Overweight: Secondary | ICD-10-CM | POA: Insufficient documentation

## 2014-09-29 DIAGNOSIS — R0600 Dyspnea, unspecified: Secondary | ICD-10-CM | POA: Insufficient documentation

## 2014-09-30 ENCOUNTER — Encounter (HOSPITAL_COMMUNITY): Payer: Self-pay

## 2014-10-05 ENCOUNTER — Encounter (HOSPITAL_COMMUNITY)
Admission: RE | Admit: 2014-10-05 | Discharge: 2014-10-05 | Disposition: A | Discharge status: Home / Self Care | Payer: Self-pay | Source: Ambulatory Visit | Attending: Cardiovascular Disease | Admitting: Cardiovascular Disease

## 2014-10-06 ENCOUNTER — Encounter (HOSPITAL_COMMUNITY): Payer: Self-pay

## 2014-10-07 ENCOUNTER — Encounter (HOSPITAL_COMMUNITY)
Admission: RE | Admit: 2014-10-07 | Discharge: 2014-10-07 | Disposition: A | Discharge status: Home / Self Care | Payer: Self-pay | Source: Ambulatory Visit | Attending: Cardiovascular Disease | Admitting: Cardiovascular Disease

## 2014-10-12 ENCOUNTER — Encounter (HOSPITAL_COMMUNITY)
Admission: RE | Admit: 2014-10-12 | Discharge: 2014-10-12 | Disposition: A | Discharge status: Home / Self Care | Payer: Self-pay | Source: Ambulatory Visit | Attending: Cardiovascular Disease | Admitting: Cardiovascular Disease

## 2014-10-13 ENCOUNTER — Encounter (HOSPITAL_COMMUNITY): Admission: RE | Admit: 2014-10-13 | Payer: Self-pay | Source: Ambulatory Visit

## 2014-10-14 ENCOUNTER — Encounter (HOSPITAL_COMMUNITY): Payer: Self-pay

## 2014-10-19 ENCOUNTER — Encounter (HOSPITAL_COMMUNITY)
Admission: RE | Admit: 2014-10-19 | Discharge: 2014-10-19 | Disposition: A | Discharge status: Home / Self Care | Payer: Self-pay | Source: Ambulatory Visit | Attending: Cardiovascular Disease | Admitting: Cardiovascular Disease

## 2014-10-20 ENCOUNTER — Encounter (HOSPITAL_COMMUNITY): Payer: Self-pay

## 2014-10-21 ENCOUNTER — Encounter (HOSPITAL_COMMUNITY)
Admission: RE | Admit: 2014-10-21 | Discharge: 2014-10-21 | Disposition: A | Discharge status: Home / Self Care | Payer: Self-pay | Source: Ambulatory Visit | Attending: Cardiovascular Disease | Admitting: Cardiovascular Disease

## 2014-10-26 ENCOUNTER — Encounter (HOSPITAL_COMMUNITY): Payer: Self-pay

## 2014-10-27 ENCOUNTER — Encounter (HOSPITAL_COMMUNITY): Payer: Self-pay

## 2014-10-28 ENCOUNTER — Encounter (HOSPITAL_COMMUNITY): Payer: Self-pay

## 2014-11-02 ENCOUNTER — Encounter (HOSPITAL_COMMUNITY)
Admission: RE | Admit: 2014-11-02 | Discharge: 2014-11-02 | Disposition: A | Discharge status: Home / Self Care | Payer: Self-pay | Source: Ambulatory Visit | Attending: Cardiovascular Disease | Admitting: Cardiovascular Disease

## 2014-11-02 DIAGNOSIS — Z5189 Encounter for other specified aftercare: Secondary | ICD-10-CM | POA: Insufficient documentation

## 2014-11-02 DIAGNOSIS — E119 Type 2 diabetes mellitus without complications: Secondary | ICD-10-CM | POA: Insufficient documentation

## 2014-11-02 DIAGNOSIS — R0600 Dyspnea, unspecified: Secondary | ICD-10-CM | POA: Insufficient documentation

## 2014-11-02 DIAGNOSIS — I119 Hypertensive heart disease without heart failure: Secondary | ICD-10-CM | POA: Insufficient documentation

## 2014-11-02 DIAGNOSIS — E663 Overweight: Secondary | ICD-10-CM | POA: Insufficient documentation

## 2014-11-03 ENCOUNTER — Encounter (HOSPITAL_COMMUNITY): Payer: Self-pay

## 2014-11-04 ENCOUNTER — Encounter (HOSPITAL_COMMUNITY)
Admission: RE | Admit: 2014-11-04 | Discharge: 2014-11-04 | Disposition: A | Discharge status: Home / Self Care | Payer: Self-pay | Source: Ambulatory Visit | Attending: Cardiovascular Disease | Admitting: Cardiovascular Disease

## 2014-11-09 ENCOUNTER — Encounter (HOSPITAL_COMMUNITY): Payer: Self-pay

## 2014-11-10 ENCOUNTER — Encounter (HOSPITAL_COMMUNITY): Payer: Self-pay

## 2014-11-11 ENCOUNTER — Encounter (HOSPITAL_COMMUNITY): Payer: Self-pay

## 2014-11-16 ENCOUNTER — Encounter (HOSPITAL_COMMUNITY): Payer: Self-pay

## 2014-11-17 ENCOUNTER — Encounter (HOSPITAL_COMMUNITY): Payer: Self-pay

## 2014-11-18 ENCOUNTER — Encounter (HOSPITAL_COMMUNITY): Payer: Self-pay

## 2014-11-19 ENCOUNTER — Encounter: Payer: Self-pay | Admitting: Cardiovascular Disease

## 2014-11-23 ENCOUNTER — Encounter (HOSPITAL_COMMUNITY): Payer: Self-pay

## 2014-11-24 ENCOUNTER — Encounter: Payer: Self-pay | Admitting: Internal Medicine

## 2014-11-24 ENCOUNTER — Ambulatory Visit (INDEPENDENT_AMBULATORY_CARE_PROVIDER_SITE_OTHER): Payer: Medicare PPO | Admitting: Internal Medicine

## 2014-11-24 ENCOUNTER — Encounter (HOSPITAL_COMMUNITY): Payer: Self-pay

## 2014-11-24 VITALS — BP 126/78 | HR 60 | Ht 72.0 in | Wt 248.8 lb

## 2014-11-24 DIAGNOSIS — I442 Atrioventricular block, complete: Secondary | ICD-10-CM | POA: Diagnosis not present

## 2014-11-24 DIAGNOSIS — I10 Essential (primary) hypertension: Secondary | ICD-10-CM

## 2014-11-24 NOTE — Patient Instructions (Signed)
Medication Instructions:  Your physician recommends that you continue on your current medications as directed. Please refer to the Current Medication list given to you today.   Labwork: None ordered  Testing/Procedures: None ordered  Follow-Up: Your physician wants you to follow-up in: 12 months with Tony Marshall, NP You will receive a reminder letter in the mail two months in advance. If you don't receive a letter, please call our office to schedule the follow-up appointment.  Remote monitoring is used to monitor your Pacemaker or ICD from home. This monitoring reduces the number of office visits required to check your device to one time per year. It allows [place] to keep an eye on the functioning of your device to ensure it is working properly. You are scheduled for a device check from home on 02/23/15. You may send your transmission at any time that day. If you have a wireless device, the transmission will be sent automatically. After your physician reviews your transmission, you will receive a postcard with your next transmission date.     Any Other Special Instructions Will Be Listed Below (If Applicable).

## 2014-11-24 NOTE — Progress Notes (Signed)
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 male who presents today for routine electrophysiology followup.  Since his recent PPM implant, the patient reports doing very well.  His energy/ exercise tolerance are much improved. Today, he denies symptoms of palpitations, chest pain, shortness of breath,  lower extremity edema, dizziness, presyncope, or syncope.  The patient is otherwise without complaint today.   Past Medical History  Diagnosis Date  . Hypertension   . Complication of anesthesia     1985 after Thyriodectomy hard time waking up  . Diabetes mellitus     taking meds  . Hypothyroidism     Had two surgeries for Cancer  . GERD (gastroesophageal reflux disease)     on meds  . Cancer     thyroid   Past Surgical History  Procedure Laterality Date  . Thyroidectomy x2    . Lumbar laminectomy/decompression microdiscectomy  07/19/2011    Procedure: LUMBAR LAMINECTOMY/DECOMPRESSION MICRODISCECTOMY 3 LEVELS;  Surgeon: Melina Schools, MD;  Location: [facility];  Service: Orthopedics;  Laterality: Left;  Lumbar three-Lumbar five LEFT DECOMPRESSION AND FORAMINOTOMY Lumbar three-four LEFT DISCECTOMY  . Permanent pacemaker insertion N/A 08/11/2014    MDT Adapta L implanted by Dr [name] for CHB    ROS- all systems are reviewed and negative except as per HPI above  Current Outpatient Prescriptions  Medication Sig Dispense Refill  . acetaminophen (TYLENOL) 500 MG tablet Take 500 mg by mouth every 6 (six) hours as needed (pain).     Marland Kitchen amLODipine (NORVASC) 10 MG tablet Take 5 mg by mouth daily.     Marland Kitchen aspirin EC 81 MG tablet Take 81 mg by mouth daily.    Marland Kitchen atorvastatin (LIPITOR) 10 MG tablet Take 10 mg by mouth daily.    . cyclobenzaprine (FLEXERIL) 10 MG tablet Take 10 mg by mouth 3 (three) times daily as needed. For muscle pain.    Marland Kitchen glipiZIDE (GLUCOTROL XL) 10 MG 24 hr tablet Take 10 mg by mouth daily.     . hydrochlorothiazide (HYDRODIURIL) 25 MG  tablet Take 25 mg by mouth daily.    Marland Kitchen HYDROcodone-acetaminophen (NORCO) 10-325 MG per tablet Take 1 tablet by mouth every 6 (six) hours as needed (pain).     Marland Kitchen ibuprofen (ADVIL,MOTRIN) 800 MG tablet Take 800 mg by mouth every 8 (eight) hours as needed. For pain.    Marland Kitchen levothyroxine (SYNTHROID, LEVOTHROID) 175 MCG tablet Take 175 mcg by mouth daily before breakfast.    . metFORMIN (GLUCOPHAGE) 500 MG tablet Take 1,000 mg by mouth 2 (two) times daily with a meal.     . mupirocin ointment (BACTROBAN) 2 % Apply 1 application topically daily.    Marland Kitchen omeprazole (PRILOSEC) 20 MG capsule Take 20 mg by mouth daily as needed. For acid reflux    . quinapril (ACCUPRIL) 20 MG tablet Take 20 mg by mouth at bedtime.     . sertraline (ZOLOFT) 100 MG tablet Take 100 mg by mouth daily.    . Tamsulosin HCl (FLOMAX) 0.4 MG CAPS Take 1 capsule (0.4 mg total) by mouth every morning. 30 capsule 0   No current facility-administered medications for this visit.    Physical Exam: Filed Vitals:   11/24/14 1507  BP: 126/78  Pulse: 60  Height: 6' (1.829 m)  Weight: 112.855 kg (248 lb 12.8 oz)    GEN- The patient is well appearing, alert and oriented x 3 today.   Head- normocephalic, atraumatic  Eyes-  Sclera clear, conjunctiva pink Ears- hearing intact Oropharynx- clear Lungs- Clear to ausculation bilaterally, normal work of breathing Chest- pacemaker pocket is well healed Heart- Regular rate and rhythm, no murmurs, rubs or gallops, PMI not laterally displaced GI- soft, NT, ND, + BS Extremities- no clubbing, cyanosis, or edema  Pacemaker interrogation- reviewed in detail today,  See PACEART report ekg reveals AV pacing  Assessment and Plan:  1. Complete heart block Normal pacemaker function See Pace Art report No changes today  2. HTn Stable No change required today  carelink Return to see EP NP in 1year Follow-up with Dr Claiborne Billings as scheduled

## 2014-11-25 ENCOUNTER — Encounter (HOSPITAL_COMMUNITY)
Admission: RE | Admit: 2014-11-25 | Discharge: 2014-11-25 | Disposition: A | Discharge status: Home / Self Care | Payer: Self-pay | Source: Ambulatory Visit | Attending: Cardiovascular Disease | Admitting: Cardiovascular Disease

## 2014-11-25 LAB — CUP PACEART INCLINIC DEVICE CHECK
Battery Impedance: 100 Ohm
Battery Remaining Longevity: 156 mo
Brady Statistic AP VS Percent: 0 %
Brady Statistic AS VS Percent: 1 %
Date Time Interrogation Session: 20160727182902
Lead Channel Impedance Value: 442 Ohm
Lead Channel Impedance Value: 769 Ohm
Lead Channel Pacing Threshold Amplitude: 0.5 V
Lead Channel Pacing Threshold Amplitude: 0.5 V
Lead Channel Pacing Threshold Pulse Width: 0.4 ms
Lead Channel Setting Pacing Amplitude: 2 V
Lead Channel Setting Pacing Amplitude: 2.5 V
Lead Channel Setting Pacing Pulse Width: 0.4 ms
Lead Channel Setting Sensing Sensitivity: 5.6 mV
MDC IDC MSMT BATTERY VOLTAGE: 2.79 V
MDC IDC MSMT LEADCHNL RA SENSING INTR AMPL: 4 mV
MDC IDC MSMT LEADCHNL RV PACING THRESHOLD PULSEWIDTH: 0.4 ms
MDC IDC MSMT LEADCHNL RV SENSING INTR AMPL: 15.67 mV
MDC IDC STAT BRADY AP VP PERCENT: 23 %
MDC IDC STAT BRADY AS VP PERCENT: 76 %

## 2014-11-30 ENCOUNTER — Encounter (HOSPITAL_COMMUNITY): Payer: Medicare PPO

## 2014-11-30 DIAGNOSIS — R0600 Dyspnea, unspecified: Secondary | ICD-10-CM | POA: Insufficient documentation

## 2014-11-30 DIAGNOSIS — I119 Hypertensive heart disease without heart failure: Secondary | ICD-10-CM | POA: Insufficient documentation

## 2014-11-30 DIAGNOSIS — Z5189 Encounter for other specified aftercare: Secondary | ICD-10-CM | POA: Insufficient documentation

## 2014-11-30 DIAGNOSIS — E119 Type 2 diabetes mellitus without complications: Secondary | ICD-10-CM | POA: Insufficient documentation

## 2014-11-30 DIAGNOSIS — E663 Overweight: Secondary | ICD-10-CM | POA: Insufficient documentation

## 2014-12-01 ENCOUNTER — Encounter (HOSPITAL_COMMUNITY): Payer: Self-pay

## 2014-12-02 ENCOUNTER — Encounter (HOSPITAL_COMMUNITY): Payer: Self-pay

## 2014-12-07 ENCOUNTER — Encounter (HOSPITAL_COMMUNITY)
Admission: RE | Admit: 2014-12-07 | Discharge: 2014-12-07 | Disposition: A | Discharge status: Home / Self Care | Payer: Self-pay | Source: Ambulatory Visit | Attending: Cardiovascular Disease | Admitting: Cardiovascular Disease

## 2014-12-08 ENCOUNTER — Encounter (HOSPITAL_COMMUNITY): Payer: Self-pay

## 2014-12-09 ENCOUNTER — Encounter (HOSPITAL_COMMUNITY): Payer: Self-pay

## 2014-12-14 ENCOUNTER — Encounter (HOSPITAL_COMMUNITY)
Admission: RE | Admit: 2014-12-14 | Discharge: 2014-12-14 | Disposition: A | Discharge status: Home / Self Care | Payer: Self-pay | Source: Ambulatory Visit | Attending: Cardiovascular Disease | Admitting: Cardiovascular Disease

## 2014-12-15 ENCOUNTER — Encounter (HOSPITAL_COMMUNITY): Payer: Self-pay

## 2014-12-16 ENCOUNTER — Encounter (HOSPITAL_COMMUNITY): Payer: Self-pay

## 2014-12-21 ENCOUNTER — Encounter (HOSPITAL_COMMUNITY)
Admission: RE | Admit: 2014-12-21 | Discharge: 2014-12-21 | Disposition: A | Discharge status: Home / Self Care | Payer: Self-pay | Source: Ambulatory Visit | Attending: Cardiovascular Disease | Admitting: Cardiovascular Disease

## 2014-12-22 ENCOUNTER — Encounter (HOSPITAL_COMMUNITY): Payer: Self-pay

## 2014-12-23 ENCOUNTER — Encounter (HOSPITAL_COMMUNITY): Payer: Self-pay

## 2014-12-28 ENCOUNTER — Encounter (HOSPITAL_COMMUNITY)
Admission: RE | Admit: 2014-12-28 | Discharge: 2014-12-28 | Disposition: A | Discharge status: Home / Self Care | Payer: Self-pay | Source: Ambulatory Visit | Attending: Cardiovascular Disease | Admitting: Cardiovascular Disease

## 2014-12-29 ENCOUNTER — Encounter (HOSPITAL_COMMUNITY): Payer: Self-pay

## 2015-01-06 ENCOUNTER — Encounter (HOSPITAL_COMMUNITY): Payer: Self-pay

## 2015-01-06 DIAGNOSIS — Z950 Presence of cardiac pacemaker: Secondary | ICD-10-CM | POA: Insufficient documentation

## 2015-01-11 ENCOUNTER — Encounter (HOSPITAL_COMMUNITY)
Admission: RE | Admit: 2015-01-11 | Discharge: 2015-01-11 | Disposition: A | Discharge status: Home / Self Care | Payer: Self-pay | Source: Ambulatory Visit | Attending: Cardiovascular Disease | Admitting: Cardiovascular Disease

## 2015-01-13 ENCOUNTER — Encounter (HOSPITAL_COMMUNITY): Payer: Self-pay

## 2015-01-18 ENCOUNTER — Encounter (HOSPITAL_COMMUNITY): Payer: Self-pay

## 2015-01-20 ENCOUNTER — Encounter (HOSPITAL_COMMUNITY)
Admission: RE | Admit: 2015-01-20 | Discharge: 2015-01-20 | Disposition: A | Discharge status: Home / Self Care | Payer: Self-pay | Source: Ambulatory Visit | Attending: Cardiovascular Disease | Admitting: Cardiovascular Disease

## 2015-01-25 ENCOUNTER — Encounter (HOSPITAL_COMMUNITY): Payer: Self-pay

## 2015-01-27 ENCOUNTER — Encounter (HOSPITAL_COMMUNITY)
Admission: RE | Admit: 2015-01-27 | Discharge: 2015-01-27 | Disposition: A | Discharge status: Home / Self Care | Payer: Self-pay | Source: Ambulatory Visit | Attending: Cardiovascular Disease | Admitting: Cardiovascular Disease

## 2015-02-01 ENCOUNTER — Encounter (HOSPITAL_COMMUNITY): Payer: Medicare PPO

## 2015-02-01 DIAGNOSIS — Z9189 Other specified personal risk factors, not elsewhere classified: Secondary | ICD-10-CM | POA: Insufficient documentation

## 2015-02-03 ENCOUNTER — Encounter (HOSPITAL_COMMUNITY)
Admission: RE | Admit: 2015-02-03 | Discharge: 2015-02-03 | Disposition: A | Discharge status: Home / Self Care | Payer: Self-pay | Source: Ambulatory Visit | Attending: Cardiovascular Disease | Admitting: Cardiovascular Disease

## 2015-02-04 ENCOUNTER — Telehealth: Payer: Self-pay | Admitting: *Deleted

## 2015-02-04 NOTE — Telephone Encounter (Signed)
Faxed cardiac rehab maintenance order to [facility].

## 2015-02-08 ENCOUNTER — Encounter (HOSPITAL_COMMUNITY): Payer: Self-pay

## 2015-02-10 ENCOUNTER — Encounter (HOSPITAL_COMMUNITY): Payer: Self-pay

## 2015-02-15 ENCOUNTER — Encounter (HOSPITAL_COMMUNITY): Payer: Self-pay

## 2015-02-17 ENCOUNTER — Encounter (HOSPITAL_COMMUNITY)
Admission: RE | Admit: 2015-02-17 | Discharge: 2015-02-17 | Disposition: A | Discharge status: Home / Self Care | Payer: Self-pay | Source: Ambulatory Visit | Attending: Cardiovascular Disease | Admitting: Cardiovascular Disease

## 2015-02-22 ENCOUNTER — Encounter (HOSPITAL_COMMUNITY)
Admission: RE | Admit: 2015-02-22 | Discharge: 2015-02-22 | Disposition: A | Discharge status: Home / Self Care | Payer: Self-pay | Source: Ambulatory Visit | Attending: Cardiovascular Disease | Admitting: Cardiovascular Disease

## 2015-02-23 ENCOUNTER — Telehealth: Payer: Self-pay | Admitting: Cardiology

## 2015-02-23 ENCOUNTER — Encounter: Payer: Medicare PPO | Admitting: *Deleted

## 2015-02-23 NOTE — Telephone Encounter (Signed)
Pt will send remote transmission when he gets back home from [place].

## 2015-02-24 ENCOUNTER — Encounter (HOSPITAL_COMMUNITY): Payer: Self-pay

## 2015-02-24 ENCOUNTER — Encounter: Payer: Self-pay | Admitting: Cardiology

## 2015-02-28 ENCOUNTER — Ambulatory Visit (INDEPENDENT_AMBULATORY_CARE_PROVIDER_SITE_OTHER): Payer: Medicare PPO | Admitting: *Deleted

## 2015-02-28 DIAGNOSIS — I442 Atrioventricular block, complete: Secondary | ICD-10-CM

## 2015-03-01 ENCOUNTER — Encounter (HOSPITAL_COMMUNITY): Payer: Medicare PPO | Attending: Cardiovascular Disease

## 2015-03-01 DIAGNOSIS — Z9189 Other specified personal risk factors, not elsewhere classified: Secondary | ICD-10-CM | POA: Insufficient documentation

## 2015-03-02 NOTE — Progress Notes (Signed)
Remote pacemaker transmission.   

## 2015-03-03 ENCOUNTER — Encounter (HOSPITAL_COMMUNITY): Payer: Self-pay

## 2015-03-04 LAB — CUP PACEART REMOTE DEVICE CHECK
Battery Impedance: 110 Ohm
Battery Remaining Longevity: 137 mo
Brady Statistic AP VP Percent: 27 %
Brady Statistic AS VS Percent: 1 %
Date Time Interrogation Session: 20161031140146
Implantable Lead Implant Date: 20160413
Implantable Lead Implant Date: 20160413
Implantable Lead Location: 753859
Implantable Lead Model: 5076
Lead Channel Impedance Value: 431 Ohm
Lead Channel Impedance Value: 756 Ohm
Lead Channel Sensing Intrinsic Amplitude: 1.4 mV
Lead Channel Setting Pacing Amplitude: 2 V
Lead Channel Setting Pacing Amplitude: 2.5 V
Lead Channel Setting Pacing Pulse Width: 0.4 ms
MDC IDC LEAD LOCATION: 753860
MDC IDC MSMT BATTERY VOLTAGE: 2.79 V
MDC IDC MSMT LEADCHNL RA PACING THRESHOLD AMPLITUDE: 0.5 V
MDC IDC MSMT LEADCHNL RA PACING THRESHOLD PULSEWIDTH: 0.4 ms
MDC IDC MSMT LEADCHNL RV PACING THRESHOLD AMPLITUDE: 0.625 V
MDC IDC MSMT LEADCHNL RV PACING THRESHOLD PULSEWIDTH: 0.4 ms
MDC IDC SET LEADCHNL RV SENSING SENSITIVITY: 4 mV
MDC IDC STAT BRADY AP VS PERCENT: 0 %
MDC IDC STAT BRADY AS VP PERCENT: 72 %

## 2015-03-07 ENCOUNTER — Encounter: Payer: Self-pay | Admitting: Cardiology

## 2015-03-08 ENCOUNTER — Encounter (HOSPITAL_COMMUNITY): Payer: Self-pay

## 2015-03-10 ENCOUNTER — Encounter (HOSPITAL_COMMUNITY): Payer: Self-pay

## 2015-03-15 ENCOUNTER — Encounter (HOSPITAL_COMMUNITY): Payer: Self-pay

## 2015-03-17 ENCOUNTER — Encounter (HOSPITAL_COMMUNITY): Payer: Self-pay

## 2015-03-22 ENCOUNTER — Encounter (HOSPITAL_COMMUNITY): Payer: Self-pay

## 2015-03-29 ENCOUNTER — Encounter (HOSPITAL_COMMUNITY): Payer: Self-pay

## 2015-03-31 ENCOUNTER — Encounter (HOSPITAL_COMMUNITY): Payer: Self-pay

## 2015-04-05 ENCOUNTER — Encounter (HOSPITAL_COMMUNITY): Payer: Self-pay

## 2015-04-07 ENCOUNTER — Encounter (HOSPITAL_COMMUNITY): Payer: Self-pay

## 2015-04-12 ENCOUNTER — Encounter (HOSPITAL_COMMUNITY): Payer: Self-pay

## 2015-04-14 ENCOUNTER — Encounter (HOSPITAL_COMMUNITY): Payer: Self-pay

## 2015-04-15 ENCOUNTER — Other Ambulatory Visit: Payer: Self-pay | Admitting: Orthopedic Surgery

## 2015-04-15 ENCOUNTER — Ambulatory Visit
Admission: RE | Admit: 2015-04-15 | Discharge: 2015-04-15 | Disposition: A | Discharge status: Home / Self Care | Payer: Medicare PPO | Source: Ambulatory Visit | Attending: Orthopedic Surgery | Admitting: Orthopedic Surgery

## 2015-04-15 DIAGNOSIS — S335XXA Sprain of ligaments of lumbar spine, initial encounter: Secondary | ICD-10-CM

## 2015-04-15 DIAGNOSIS — M4806 Spinal stenosis, lumbar region: Secondary | ICD-10-CM | POA: Diagnosis not present

## 2015-04-19 ENCOUNTER — Encounter (HOSPITAL_COMMUNITY): Payer: Self-pay

## 2015-04-21 ENCOUNTER — Encounter (HOSPITAL_COMMUNITY): Payer: Self-pay

## 2015-04-26 ENCOUNTER — Encounter (HOSPITAL_COMMUNITY): Payer: Self-pay

## 2015-04-26 DIAGNOSIS — M4156 Other secondary scoliosis, lumbar region: Secondary | ICD-10-CM | POA: Diagnosis not present

## 2015-04-26 DIAGNOSIS — S39012D Strain of muscle, fascia and tendon of lower back, subsequent encounter: Secondary | ICD-10-CM | POA: Diagnosis not present

## 2015-04-27 DIAGNOSIS — I10 Essential (primary) hypertension: Secondary | ICD-10-CM | POA: Diagnosis not present

## 2015-04-27 DIAGNOSIS — E1121 Type 2 diabetes mellitus with diabetic nephropathy: Secondary | ICD-10-CM | POA: Diagnosis not present

## 2015-04-27 DIAGNOSIS — G4733 Obstructive sleep apnea (adult) (pediatric): Secondary | ICD-10-CM | POA: Diagnosis not present

## 2015-04-27 DIAGNOSIS — K219 Gastro-esophageal reflux disease without esophagitis: Secondary | ICD-10-CM | POA: Diagnosis not present

## 2015-04-27 DIAGNOSIS — E7800 Pure hypercholesterolemia, unspecified: Secondary | ICD-10-CM | POA: Diagnosis not present

## 2015-04-27 DIAGNOSIS — Z0000 Encounter for general adult medical examination without abnormal findings: Secondary | ICD-10-CM | POA: Diagnosis not present

## 2015-04-27 DIAGNOSIS — E039 Hypothyroidism, unspecified: Secondary | ICD-10-CM | POA: Diagnosis not present

## 2015-04-27 DIAGNOSIS — Z23 Encounter for immunization: Secondary | ICD-10-CM | POA: Diagnosis not present

## 2015-04-27 DIAGNOSIS — Z79899 Other long term (current) drug therapy: Secondary | ICD-10-CM | POA: Diagnosis not present

## 2015-04-28 ENCOUNTER — Encounter (HOSPITAL_COMMUNITY): Payer: Self-pay

## 2015-04-28 DIAGNOSIS — Z79899 Other long term (current) drug therapy: Secondary | ICD-10-CM | POA: Diagnosis not present

## 2015-04-28 DIAGNOSIS — Z7984 Long term (current) use of oral hypoglycemic drugs: Secondary | ICD-10-CM | POA: Diagnosis not present

## 2015-04-28 DIAGNOSIS — E1121 Type 2 diabetes mellitus with diabetic nephropathy: Secondary | ICD-10-CM | POA: Diagnosis not present

## 2015-04-28 DIAGNOSIS — E7800 Pure hypercholesterolemia, unspecified: Secondary | ICD-10-CM | POA: Diagnosis not present

## 2015-04-28 DIAGNOSIS — E039 Hypothyroidism, unspecified: Secondary | ICD-10-CM | POA: Diagnosis not present

## 2015-05-03 ENCOUNTER — Encounter (HOSPITAL_COMMUNITY): Payer: Self-pay

## 2015-05-05 ENCOUNTER — Encounter (HOSPITAL_COMMUNITY): Payer: Self-pay

## 2015-05-10 ENCOUNTER — Encounter (HOSPITAL_COMMUNITY): Payer: Self-pay

## 2015-05-12 ENCOUNTER — Encounter (HOSPITAL_COMMUNITY): Payer: Self-pay

## 2015-05-17 ENCOUNTER — Encounter (HOSPITAL_COMMUNITY): Payer: Self-pay

## 2015-05-19 ENCOUNTER — Encounter (HOSPITAL_COMMUNITY): Payer: Self-pay

## 2015-05-24 ENCOUNTER — Encounter (HOSPITAL_COMMUNITY): Payer: Self-pay

## 2015-05-26 ENCOUNTER — Encounter (HOSPITAL_COMMUNITY): Payer: Self-pay

## 2015-05-30 ENCOUNTER — Encounter: Payer: Medicare Other | Admitting: *Deleted

## 2015-05-30 ENCOUNTER — Telehealth: Payer: Self-pay | Admitting: Cardiology

## 2015-05-30 NOTE — Telephone Encounter (Signed)
Spoke with pt and reminded pt of remote transmission that is due today. Pt verbalized understanding.   

## 2015-05-31 ENCOUNTER — Encounter (HOSPITAL_COMMUNITY): Payer: Self-pay

## 2015-06-01 ENCOUNTER — Encounter: Payer: Self-pay | Admitting: Cardiology

## 2015-06-02 ENCOUNTER — Encounter (HOSPITAL_COMMUNITY): Payer: Self-pay

## 2015-06-07 ENCOUNTER — Encounter (HOSPITAL_COMMUNITY): Payer: Self-pay

## 2015-06-09 ENCOUNTER — Encounter (HOSPITAL_COMMUNITY): Payer: Self-pay

## 2015-06-14 ENCOUNTER — Encounter (HOSPITAL_COMMUNITY): Payer: Self-pay

## 2015-06-16 ENCOUNTER — Encounter (HOSPITAL_COMMUNITY): Payer: Self-pay

## 2015-06-21 ENCOUNTER — Encounter (HOSPITAL_COMMUNITY): Payer: Self-pay

## 2015-06-23 ENCOUNTER — Encounter (HOSPITAL_COMMUNITY): Payer: Self-pay

## 2015-06-28 ENCOUNTER — Encounter (HOSPITAL_COMMUNITY): Payer: Self-pay

## 2015-06-29 ENCOUNTER — Encounter: Payer: Self-pay | Admitting: *Deleted

## 2015-06-30 ENCOUNTER — Encounter (HOSPITAL_COMMUNITY): Payer: Self-pay

## 2015-07-05 ENCOUNTER — Encounter (HOSPITAL_COMMUNITY): Payer: Self-pay

## 2015-07-07 ENCOUNTER — Encounter (HOSPITAL_COMMUNITY): Payer: Self-pay

## 2015-07-12 ENCOUNTER — Encounter (HOSPITAL_COMMUNITY): Payer: Self-pay

## 2015-07-14 ENCOUNTER — Encounter (HOSPITAL_COMMUNITY): Payer: Self-pay

## 2015-07-19 ENCOUNTER — Encounter (HOSPITAL_COMMUNITY): Payer: Self-pay

## 2015-07-21 ENCOUNTER — Encounter (HOSPITAL_COMMUNITY): Payer: Self-pay

## 2015-07-26 ENCOUNTER — Encounter (HOSPITAL_COMMUNITY): Payer: Self-pay

## 2015-07-28 ENCOUNTER — Encounter (HOSPITAL_COMMUNITY): Payer: Self-pay

## 2015-08-02 ENCOUNTER — Encounter (HOSPITAL_COMMUNITY): Payer: Self-pay

## 2015-08-04 ENCOUNTER — Encounter (HOSPITAL_COMMUNITY): Payer: Self-pay

## 2015-08-09 ENCOUNTER — Encounter (HOSPITAL_COMMUNITY): Payer: Self-pay

## 2015-08-11 ENCOUNTER — Encounter (HOSPITAL_COMMUNITY): Payer: Self-pay

## 2015-08-16 ENCOUNTER — Encounter (HOSPITAL_COMMUNITY): Payer: Self-pay

## 2015-08-18 ENCOUNTER — Encounter (HOSPITAL_COMMUNITY): Payer: Self-pay

## 2015-08-23 ENCOUNTER — Encounter (HOSPITAL_COMMUNITY): Payer: Self-pay

## 2015-08-25 ENCOUNTER — Encounter (HOSPITAL_COMMUNITY): Payer: Self-pay

## 2015-11-16 ENCOUNTER — Encounter: Payer: Self-pay | Admitting: Podiatry

## 2015-11-16 ENCOUNTER — Ambulatory Visit (INDEPENDENT_AMBULATORY_CARE_PROVIDER_SITE_OTHER): Payer: Medicare Other | Admitting: Podiatry

## 2015-11-16 ENCOUNTER — Ambulatory Visit (INDEPENDENT_AMBULATORY_CARE_PROVIDER_SITE_OTHER): Payer: Medicare Other

## 2015-11-16 VITALS — BP 134/76 | HR 88 | Resp 125

## 2015-11-16 DIAGNOSIS — R52 Pain, unspecified: Secondary | ICD-10-CM

## 2015-11-16 DIAGNOSIS — M7130 Other bursal cyst, unspecified site: Secondary | ICD-10-CM

## 2015-11-16 NOTE — Progress Notes (Signed)
   Subjective:    Patient ID: Tony [name], male    DOB: October 27, 1950, 65 y.o.   MRN: [id number]  HPI    Today this patient presents complaining of approximately year history of a callus and soft tissue lesion on the medial right hallux that causes him to some occasional discomfort when walking wearing shoes. He describes occasionally trimming a callus over this area and notices that there is some occasional sticky fluid that is released from the area. He says an orthopedic surgeon gave him some topical antibiotic ointment for this area which he applied which she think was helpful. The lesion size has persisted over time, however, he has not noticed any recent drainage or the need to trim callus from this area.  Patient is type II diabetic denies any history of foot ulceration, claudication or amputation. He states that his A1c recently has increased into 8 range  Patient works as a Artist  Review of Systems  Skin: Positive for color change.       Objective:   Physical Exam  Orientated 3  Vascular: No calf edema or calf tenderness bilaterally DP and PT pulses 2/4 bilaterally Capillary reflex immediate bilaterally  Neurological: Sensation to 10 g monofilament wire intact 4/5 right and 5/5 left Vibratory sensation reactive bilaterally Ankle reflex equal and reactive bilaterally  Dermatological: No open skin lesions bilaterally Raised 10 mm palpable thickening in the medial right hallux interphalangeal joint. Palpating this area able to move this soft tissue lesion. Compressing the lesion manually no drainage was expressed from the area. The overlying skin has slight erythema without any warmth  Musculoskeletal: HAV deformity left  X-ray examination right foot dated 11/16/2015  Circular wire placed over soft tissue mass medial right hallux interphalangeal joint No increased soft tissue or bony lesion in the area circled with wire marker HAV Bone density  appears adequate  Radiographic impression: No acute bony abnormality or increase in soft tissue density in the right foot x-ray dated 11/16/2015    Assessment & Plan:   Assessment: Satisfactory neurovascular status Probable bursal sac/cyst medial interphalangeal joint right hallux  Plan: I reviewed the results of the exam and x-ray with patient today. I advised him that most likely had a small fluid-filled cyst in the area. At this time I recommended further follow-up and evaluation and referral to Dr. Earleen Newport. In the interim. I recommended he apply Vaseline and a Band-Aid to the area to reduce shoe rubto the area. Also, encouraged patient to obtain better control of his diabetes and reduces A1c in conjunction with his treating physician for his diabetes  Reappoint for follow-up evaluation with Dr. Earleen Newport for a soft tissue mass medial right hallux interphalangeal joint

## 2015-11-16 NOTE — Patient Instructions (Signed)
Today her diabetic foot screen demonstrated adequate feeling in pulsation in your right and left feet The raised area on your right great toe is most consistent with a cyst light growth possible bursal sac. Check this area on your right great toe with Vaseline and a Band-Aid to reduce friction and shoe rub Reschedule with Dr. Earleen Newport to evaluate this area in more detail and treatment as you mutually agree upon Diabetes and Foot Care Diabetes may cause you to have problems because of poor blood supply (circulation) to your feet and legs. This may cause the skin on your feet to become thinner, break easier, and heal more slowly. Your skin may become dry, and the skin may peel and crack. You may also have nerve damage in your legs and feet causing decreased feeling in them. You may not notice minor injuries to your feet that could lead to infections or more serious problems. Taking care of your feet is one of the most important things you can do for yourself.  HOME CARE INSTRUCTIONS  Wear shoes at all times, even in the house. Do not go barefoot. Bare feet are easily injured.  Check your feet daily for blisters, cuts, and redness. If you cannot see the bottom of your feet, use a mirror or ask someone for help.  Wash your feet with warm water (do not use hot water) and mild soap. Then pat your feet and the areas between your toes until they are completely dry. Do not soak your feet as this can dry your skin.  Apply a moisturizing lotion or petroleum jelly (that does not contain alcohol and is unscented) to the skin on your feet and to dry, brittle toenails. Do not apply lotion between your toes.  Trim your toenails straight across. Do not dig under them or around the cuticle. File the edges of your nails with an emery board or nail file.  Do not cut corns or calluses or try to remove them with medicine.  Wear clean socks or stockings every day. Make sure they are not too tight. Do not wear knee-high  stockings since they may decrease blood flow to your legs.  Wear shoes that fit properly and have enough cushioning. To break in new shoes, wear them for just a few hours a day. This prevents you from injuring your feet. Always look in your shoes before you put them on to be sure there are no objects inside.  Do not cross your legs. This may decrease the blood flow to your feet.  If you find a minor scrape, cut, or break in the skin on your feet, keep it and the skin around it clean and dry. These areas may be cleansed with mild soap and water. Do not cleanse the area with peroxide, alcohol, or iodine.  When you remove an adhesive bandage, be sure not to damage the skin around it.  If you have a wound, look at it several times a day to make sure it is healing.  Do not use heating pads or hot water bottles. They may burn your skin. If you have lost feeling in your feet or legs, you may not know it is happening until it is too late.  Make sure your health care provider performs a complete foot exam at least annually or more often if you have foot problems. Report any cuts, sores, or bruises to your health care provider immediately. SEEK MEDICAL CARE IF:   You have an injury that is  not healing.  You have cuts or breaks in the skin.  You have an ingrown nail.  You notice redness on your legs or feet.  You feel burning or tingling in your legs or feet.  You have pain or cramps in your legs and feet.  Your legs or feet are numb.  Your feet always feel cold. SEEK IMMEDIATE MEDICAL CARE IF:   There is increasing redness, swelling, or pain in or around a wound.  There is a red line that goes up your leg.  Pus is coming from a wound.  You develop a fever or as directed by your health care provider.  You notice a bad smell coming from an ulcer or wound.   This information is not intended to replace advice given to you by your health care provider. Make sure you discuss any questions  you have with your health care provider.   Document Released: 04/13/2000 Document Revised: 12/17/2012 Document Reviewed: 09/23/2012 Elsevier Interactive Patient Education [employer].

## 2015-12-16 ENCOUNTER — Encounter: Payer: Self-pay | Admitting: Podiatry

## 2015-12-16 ENCOUNTER — Ambulatory Visit (INDEPENDENT_AMBULATORY_CARE_PROVIDER_SITE_OTHER): Payer: Medicare Other | Admitting: Podiatry

## 2015-12-16 VITALS — BP 146/72 | HR 88 | Resp 12

## 2015-12-16 DIAGNOSIS — M6740 Ganglion, unspecified site: Secondary | ICD-10-CM

## 2015-12-16 NOTE — Patient Instructions (Signed)
Ganglion Cyst  A ganglion cyst is a noncancerous, fluid-filled lump that occurs near joints or tendons. The ganglion cyst grows out of a joint or the lining of a tendon. It most often develops in the hand or wrist, but it can also develop in the shoulder, elbow, hip, knee, ankle, or foot. The round or oval ganglion cyst can be the size of a pea or larger than a grape. Increased activity may enlarge the size of the cyst because more fluid starts to build up.   CAUSES  It is not known what causes a ganglion cyst to grow. However, it may be related to:  · Inflammation or irritation around the joint.  · An injury.  · Repetitive movements or overuse.  · Arthritis.  RISK FACTORS  Risk factors include:  · Being a woman.  · Being age 20-50.  SIGNS AND SYMPTOMS  Symptoms may include:   · A lump. This most often appears on the hand or wrist, but it can occur in other areas of the body.  · Tingling.  · Pain.  · Numbness.  · Muscle weakness.  · Weak grip.  · Less movement in a joint.  DIAGNOSIS  Ganglion cysts are most often diagnosed based on a physical exam. Your health care provider will feel the lump and may shine a light alongside it. If it is a ganglion cyst, a light often shines through it. Your health care provider may order an X-ray, ultrasound, or MRI to rule out other conditions.  TREATMENT  Ganglion cysts usually go away on their own without treatment. If pain or other symptoms are involved, treatment may be needed. Treatment is also needed if the ganglion cyst limits your movement or if it gets infected. Treatment may include:  · Wearing a brace or splint on your wrist or finger.  · Taking anti-inflammatory medicine.  · Draining fluid from the lump with a needle (aspiration).  · Injecting a steroid into the joint.  · Surgery to remove the ganglion cyst.  HOME CARE INSTRUCTIONS  · Do not press on the ganglion cyst, poke it with a needle, or hit it.  · Take medicines only as directed by your health care  provider.  · Wear your brace or splint as directed by your health care provider.  · Watch your ganglion cyst for any changes.  · Keep all follow-up visits as directed by your health care provider. This is important.  SEEK MEDICAL CARE IF:  · Your ganglion cyst becomes larger or more painful.  · You have increased redness, red streaks, or swelling.  · You have pus coming from the lump.  · You have weakness or numbness in the affected area.  · You have a fever or chills.     This information is not intended to replace advice given to you by your health care provider. Make sure you discuss any questions you have with your health care provider.     Document Released: 04/13/2000 Document Revised: 05/07/2014 Document Reviewed: 09/29/2013  Elsevier Interactive Patient Education ©2016 Elsevier Inc.

## 2015-12-19 DIAGNOSIS — M6740 Ganglion, unspecified site: Secondary | ICD-10-CM | POA: Insufficient documentation

## 2015-12-19 NOTE — Progress Notes (Signed)
Subjective: 65 year old male presents the office today for concerns of a cyst of the side of his right big toe which is been ongoing for quite some time. She previously saw orthopedic surgeon was given antibiotic ointment to apply to the area. He has internal pad the area. He states the area has gotten bigger. Denies any systemic complaints such as fevers, chills, nausea, vomiting. No acute changes since last appointment, and no other complaints at this time.   Objective: AAO x3, NAD DP/PT pulses palpable bilaterally, CRT less than 3 seconds Fluid-filled soft tissue mass present on the medial aspect of the right hallux. There is no erythema or increase in warmth. There is no edema. No skin breakdown.  No edema, erythema, increase in warmth to bilateral lower extremities.  No open lesions or pre-ulcerative lesions.  No pain with calf compression, swelling, warmth, erythema  Assessment: Right hallux ganglion cyst  Plan: -All treatment options discussed with the patient including all alternatives, risks, complications.  -Previous x-rays and chart were reviewed. -Etiology of symptoms discussed -At this time recommended aspiration and steroid injection to the cyst. He wishes to proceed. Under sterile conditions a mixture of Kenalog and local anesthetic was infiltrated into the cyst. I was able to express clear, jellylike fluid upon puncturing the cyst. -Antibiotic ointment and a bandage was applied. -Monitor for any recurrence. If it reoccurs discussed and surgical excision. -Patient encouraged to call the office with any questions, concerns, change in symptoms.   Celesta Gentile, DPM

## 2015-12-21 ENCOUNTER — Encounter: Payer: Self-pay | Admitting: Physician Assistant

## 2015-12-21 ENCOUNTER — Other Ambulatory Visit: Payer: Self-pay | Admitting: *Deleted

## 2015-12-21 ENCOUNTER — Other Ambulatory Visit: Payer: Self-pay | Admitting: Physician Assistant

## 2015-12-21 ENCOUNTER — Ambulatory Visit (HOSPITAL_COMMUNITY)
Admission: RE | Admit: 2015-12-21 | Discharge: 2015-12-21 | Disposition: A | Discharge status: Home / Self Care | Payer: Medicare Other | Source: Ambulatory Visit | Attending: Physician Assistant | Admitting: Physician Assistant

## 2015-12-21 ENCOUNTER — Ambulatory Visit (INDEPENDENT_AMBULATORY_CARE_PROVIDER_SITE_OTHER): Payer: Medicare Other | Admitting: Physician Assistant

## 2015-12-21 VITALS — BP 138/82 | HR 65 | Temp 97.9°F | Resp 17 | Ht 71.5 in | Wt 244.0 lb

## 2015-12-21 DIAGNOSIS — R220 Localized swelling, mass and lump, head: Secondary | ICD-10-CM

## 2015-12-21 DIAGNOSIS — R221 Localized swelling, mass and lump, neck: Secondary | ICD-10-CM | POA: Insufficient documentation

## 2015-12-21 DIAGNOSIS — R938 Abnormal findings on diagnostic imaging of other specified body structures: Secondary | ICD-10-CM | POA: Diagnosis not present

## 2015-12-21 DIAGNOSIS — R9389 Abnormal findings on diagnostic imaging of other specified body structures: Secondary | ICD-10-CM

## 2015-12-21 LAB — POCT CBC
Granulocyte percent: 64.6 %G (ref 37–80)
HEMATOCRIT: 38.6 % — AB (ref 43.5–53.7)
HEMOGLOBIN: 13.8 g/dL — AB (ref 14.1–18.1)
Lymph, poc: 2.3 (ref 0.6–3.4)
MCH, POC: 31.1 pg (ref 27–31.2)
MCHC: 35.7 g/dL — AB (ref 31.8–35.4)
MCV: 87.3 fL (ref 80–97)
MID (cbc): 0.6 (ref 0–0.9)
MPV: 6.7 fL (ref 0–99.8)
POC GRANULOCYTE: 5.2 (ref 2–6.9)
POC LYMPH PERCENT: 28 %L (ref 10–50)
POC MID %: 7.4 % (ref 0–12)
Platelet Count, POC: 202 10*3/uL (ref 142–424)
RBC: 4.42 M/uL — AB (ref 4.69–6.13)
RDW, POC: 13.6 %
WBC: 8.1 10*3/uL (ref 4.6–10.2)

## 2015-12-21 LAB — GLUCOSE, POCT (MANUAL RESULT ENTRY): POC Glucose: 122 mg/dl — AB (ref 70–99)

## 2015-12-21 LAB — POCT RAPID STREP A (OFFICE): Rapid Strep A Screen: NEGATIVE

## 2015-12-21 MED ORDER — AMOXICILLIN-POT CLAVULANATE 875-125 MG PO TABS: 1.0000 | ORAL_TABLET | Freq: Two times a day (BID) | ORAL | 0 refills | Status: DC

## 2015-12-21 NOTE — Telephone Encounter (Signed)
Please contact patient.  If have tried to do this.  Please inform that the ultrasound showed possible adenopathy which we suspected, or possible mass.  It was just untelling.  What I will do is schedule a CT scan  Of the neck.  I would like you to go ahead and take the antibiotic at this time.  Return sooner, if the mass enlarges, sob, or trouble breathing.

## 2015-12-21 NOTE — Progress Notes (Signed)
8.1  

## 2015-12-21 NOTE — Progress Notes (Addendum)
Patient ID: RAEBURN TOKUDA, male   DOB: Dec 13, 1950, 65 y.o.   MRN: [id number] [facility] [address], [place] [address] [phone]  Date:  12/21/2015   Name:  FURIOUS WRITER   DOB:  August 06, 1950   MRN:  [id number]  PCP:  [name], MD   By signing my name below, I, Ladene Artist, attest that this documentation has been prepared under the direction and in the presence of Ivar Drape, PA-C Electronically Signed: Ladene Artist, ED Scribe 12/21/2015 at 9:50 AM.  History of Present Illness:  QUANTAVIS RIVETT is a 65 y.o. male patient who presents to [facility] complaining of gradual onset of swelling to the front neck first noticed 2 days ago. Pt states that the area started as a small lump approximately 2 days ago and he suspected that it may be a lymph node. He states his blood glucose was 110 yesterday; pt takes 1 metformin at night, 1 in the am and 1 glipizide. Pt states that he rechecked his blood glucose this morning with a reading of 380, which gradually elevated to 400, 420 and 440. Pt denies fever, chills, generalized body aches, sob, difficulty breathing, pain with eating or chewing. No h/o tobacco use. Pt's last dental appointment was 1 month ago; pt states that he had a deep cavity with gingivitis that required a root canal but has not had any dental pain in 3-4 weeks. Pt reports h/o thyroid CA ~35 years ago.   Patient Active Problem List   Diagnosis Date Noted   Ganglion cyst 12/19/2015   Aortic stenosis 09/01/2014   Essential hypertension 09/01/2014   Type 2 diabetes mellitus with peripheral neuropathy ([place]) 09/01/2014   Hypothyroidism 09/01/2014   OSA on CPAP 09/01/2014   CHB (complete heart block) ([place]) 08/11/2014   Complete heart block ([place]) 08/05/2014   Lumbar stenosis 07/16/2011   HNP (herniated nucleus pulposus), lumbar 07/16/2011    Past Medical History:  Diagnosis Date   Cancer ([place])    thyroid   Complication of  anesthesia    1985 after Thyriodectomy hard time waking up   Diabetes mellitus    taking meds   GERD (gastroesophageal reflux disease)    on meds   Hypertension    Hypothyroidism    Had two surgeries for Cancer    Past Surgical History:  Procedure Laterality Date   LUMBAR LAMINECTOMY/DECOMPRESSION MICRODISCECTOMY  07/19/2011   Procedure: LUMBAR LAMINECTOMY/DECOMPRESSION MICRODISCECTOMY 3 LEVELS;  Surgeon: Melina Schools, MD;  Location: [facility];  Service: Orthopedics;  Laterality: Left;  Lumbar three-Lumbar five LEFT DECOMPRESSION AND FORAMINOTOMY Lumbar three-four LEFT DISCECTOMY   PERMANENT PACEMAKER INSERTION N/A 08/11/2014   MDT Adapta L implanted by Dr [name] for CHB   Thyroidectomy x2      Social History  Substance Use Topics   Smoking status: Never Smoker   Smokeless tobacco: Not on file   Alcohol use No    Family History  Problem Relation Age of Onset   Anesthesia problems Neg Hx    Hypotension Neg Hx    Pseudochol deficiency Neg Hx    Lung cancer Mother    Heart disease Father    Heart attack Father     Allergies  Allergen Reactions   Crestor [Rosuvastatin Calcium] Other (See Comments)    Leg pain   Simvastatin Other (See Comments)    Leg pain    Medication list has been reviewed and updated.  Current Outpatient Prescriptions on File Prior  to Visit  Medication Sig Dispense Refill   acetaminophen (TYLENOL) 500 MG tablet Take 500 mg by mouth every 6 (six) hours as needed (pain).      amLODipine (NORVASC) 10 MG tablet Take 5 mg by mouth daily.      aspirin EC 81 MG tablet Take 81 mg by mouth daily.     atorvastatin (LIPITOR) 10 MG tablet Take 10 mg by mouth daily.     cyclobenzaprine (FLEXERIL) 10 MG tablet Take 10 mg by mouth 3 (three) times daily as needed. For muscle pain.     glipiZIDE (GLUCOTROL XL) 10 MG 24 hr tablet Take 10 mg by mouth daily.      hydrochlorothiazide (HYDRODIURIL) 25 MG tablet Take 25 mg by mouth daily.      HYDROcodone-acetaminophen (NORCO) 10-325 MG per tablet Take 1 tablet by mouth every 6 (six) hours as needed (pain).      ibuprofen (ADVIL,MOTRIN) 800 MG tablet Take 800 mg by mouth every 8 (eight) hours as needed. For pain.     levothyroxine (SYNTHROID, LEVOTHROID) 175 MCG tablet Take 175 mcg by mouth daily before breakfast.     metFORMIN (GLUCOPHAGE) 500 MG tablet Take 1,000 mg by mouth 2 (two) times daily with a meal.      mupirocin ointment (BACTROBAN) 2 % Apply 1 application topically daily.     omeprazole (PRILOSEC) 20 MG capsule Take 20 mg by mouth daily as needed. For acid reflux     quinapril (ACCUPRIL) 20 MG tablet Take 20 mg by mouth at bedtime.      sertraline (ZOLOFT) 100 MG tablet Take 100 mg by mouth daily.     Tamsulosin HCl (FLOMAX) 0.4 MG CAPS Take 1 capsule (0.4 mg total) by mouth every morning. 30 capsule 0   No current facility-administered medications on file prior to visit.     Review of Systems  Constitutional: Negative for chills and fever.  Respiratory: Negative for shortness of breath.   Musculoskeletal: Negative for myalgias.    Physical Examination: BP 138/82 (BP Location: Right Arm, Patient Position: Sitting, Cuff Size: Normal)    Pulse 65    Temp 97.9 F (36.6 C) (Oral)    Resp 17    Ht 5' 11.5" (1.816 m)    Wt 244 lb (110.7 kg)    SpO2 98%    BMI 33.56 kg/m  Ideal Body Weight: @FLOWAMB [id number]  Physical Exam  Constitutional: He is oriented to person, place, and time. He appears well-developed and well-nourished. No distress.  HENT:  Head: Normocephalic and atraumatic.  Detention shows repaired carries however no gingival swelling No tooth pain No masses under the tongue appreciable   Eyes: Conjunctivae and EOM are normal. Pupils are equal, round, and reactive to light.  Cardiovascular: Normal rate.   Pulmonary/Chest: Effort normal. No respiratory distress.  Lymphadenopathy:  Submandibular swelling without anterior lymphadenopathy Non  pulsatile  Neurological: He is alert and oriented to person, place, and time.  Skin: Skin is warm and dry. He is not diaphoretic.  Psychiatric: He has a normal mood and affect. His behavior is normal.  US Soft Tissue Head/neck  Result Date: 12/21/2015 CLINICAL DATA:  Palpable neck lesion. Remote total thyroidectomy for carcinoma. EXAM: ULTRASOUND OF HEAD/NECK SOFT TISSUES TECHNIQUE: Ultrasound examination of the head and neck soft tissues was performed in the area of clinical concern. COMPARISON:  CT 12/15/2010 FINDINGS: 1.2 x 1.8 x 1.1 cm heterogeneous hypoechoic solid subcutaneous nodule in the submental region corresponding to palpable abnormality. Internal  blood flow on color Doppler. No other cervical adenopathy is demonstrated. IMPRESSION: 1. 1.8 cm submental mass or pathologic adenopathy. Electronically Signed   By: Lucrezia Europe M.D.   On: 12/21/2015 11:54    Assessment and Plan: VINN ZANER is a 65 y.o. male who is here today for cc of throat nodule. We will proceed with [place], will contact following result today.  Advised to contact pcp for diabetes recheck.  Submandibular swelling - Plan: POCT glucose (manual entry), POCT rapid strep A, POCT CBC, Culture, Group A Strep, US Soft Tissue Head/Neck, Ambulatory referral to ENT, CT Soft Tissue Neck W Contrast  Abnormal ultrasound of neck - Plan: Ambulatory referral to ENT, CT Soft Tissue Neck W Contrast  Ivar Drape, PA-C [facility] 8/26/201710:04 PM  I personally performed the services described in this documentation, which was scribed in my presence. The recorded information has been reviewed and is accurate.  Ivar Drape, PA-C [facility] 12/21/2015 9:20 AM  Addendum: CT ordered following [place] result.  Advised to fill augmentin at this time.  Will follow up pending ct results.  IMPRESSION: 1. 1.8 cm submental mass or pathologic  adenopathy.

## 2015-12-21 NOTE — Patient Instructions (Addendum)
I will contact you following the ultrasound results.  I am proceeding with an order for augmentin twice per day for 10 days.  If your lab results show otherwise treatment, then I will contact you as well.  So you can hold off picking up prescription.  I would also like you to follow up with your primary care office regarding your diabetes.   You should be taking 1000mg  twice per day of the metformin.  If you are not, please do this, and then follow up with pcp accordingly.    IF you received an x-ray today, you will receive an invoice from [facility]. Please contact [facility] at [phone] with questions or concerns regarding your invoice.   IF you received labwork today, you will receive an invoice from [employer]. Please contact [facility] at [phone] with questions or concerns regarding your invoice.   Our billing staff will not be able to assist you with questions regarding bills from these companies.  You will be contacted with the lab results as soon as they are available. The fastest way to get your results is to activate your My Chart account. Instructions are located on the last page of this paperwork. If you have not heard from [place] regarding the results in 2 weeks, please contact this office.     You have an appointment at [facility] for an ultrasound Go straight there, your appt is at 11:00  [place]

## 2015-12-21 NOTE — Telephone Encounter (Signed)
Pt was called and given his ultrasound results. He would like a CB concerning his sugar level. It is at 469 now at 3:15. Pt says feels fine.  He is wondering what he should do. Please advise at [phone]

## 2015-12-22 LAB — CULTURE, GROUP A STREP: Organism ID, Bacteria: NORMAL

## 2015-12-22 NOTE — Telephone Encounter (Signed)
I called on my cell phone but went to voicemail.  Please contact him with the following questions and suggestion Did you contact pcp as we discussed regarding his blood sugar? One thing we can try is placing him on a metformin extended release while still taking it twice per day.  It sounds like the metformin wares off mid day (4-6Pm??) before the next dosing.  If he is open to this, I will prescribe it.

## 2015-12-22 NOTE — Telephone Encounter (Signed)
LMOM for pt to CB. I will pend the correct version of Metformin ER 500  which is the only one that is generic and affordable for pts for Stephanie to review for appropriate sig and quantity.  Pt called back right away. He reported when he couldn't speak to [place] last night, his daughter (the doctor) told him to take 6 units of his wife's Levemir, and it brought his BS down and he felt much better, and woke w/fasting BS this morning of 102. It has been in the 300s though again most of the day. He WAS able to reach his PCP who didn't want him to take more insulin now, but did increase his glipizide from 5 mg QD to TID (total 15 mg). Discussed w/pt the idea of changing him to the ER version of metformin and he was very willing to try this and thought it was a good idea. I advised that Colletta Maryland will be in tomorrow and we will let him know once it has been sent to the pharm.

## 2015-12-23 ENCOUNTER — Telehealth: Payer: Self-pay

## 2015-12-23 NOTE — Telephone Encounter (Signed)
Patient request for [place] to fax over prescription Metformin to [facility] [place], [place], [place], [place] [address] Phone: [phone]. Patient request for this to be done today. [phone]

## 2015-12-24 ENCOUNTER — Other Ambulatory Visit: Payer: Self-pay | Admitting: Radiology

## 2015-12-24 NOTE — Telephone Encounter (Signed)
I spoke to patient today. He stated his glucose is 500, and he wants the Metformin ER sent in. I have told him with a glucose this high, this needs urgent attention, and the emergency room would be his best choice. I have discussed with Philis Fendt and Dr Everlene Farrier. Patient then told me his glucose is down to 190 after Dr [name] adjusted his Glipizide. I have advised him if Dr [name] adjusted his meds, he should follow the advise of Dr [name]. If more than one provider is altering his meds, this is dangerous for him. I did tell him if his blood sugar is above 500, go to the ER. But he has told me two different numbers.   Colletta Maryland, I am sending you this message so you are aware of the conversation,and aware patient has been advised to let Dr [name] manage his diabetes meds and he should go to ER for blood sugar readings of 500

## 2015-12-25 NOTE — Telephone Encounter (Signed)
After seeing that he has been conferring with his pcp and pcp increased the glipizide, not to mention his daughter (a physician), I do not want to add another medication at this time.  He should have his diabetes controlled by his pcp.  He should make sure that his pcp knows the dosing of the metformin (1 or 2 tablets??, twice per day??) and that the pcp is in agreement.  Please advise him to do this.  If his pcp wants to change the metformin, then he will do just that.

## 2015-12-26 NOTE — Telephone Encounter (Signed)
Pt stated that he actually saw his PCP today and he increased his glipizide to 5 mg TID and did write Rx for the ER version of metformin. Pt reported that his BS is back in the 100s. PCP is also following the swollen lymph node which has already decreased in size significantly.

## 2015-12-27 NOTE — Telephone Encounter (Signed)
Please see that Pamala Hurry briggs, rn got my message back.  I sent a note regarding the prescription request and declined it, and the reasoning, but do not see this in notes.  I declined the start of the metforminxr due to the multiple providers advising his medication for diabetes.  Advised to take levemir, then provider changed the glipizide.  We do not want his hemoglobin to go too low.  Best to stick with his pcp, as I advised initially at visit.  He should also advise his pcp the exact dosage and amount of pills he takes and when each day.  This was unclear as well (500mg  or 1000mg , and 1 tablet or 2).Marland KitchenMarland Kitchen

## 2015-12-29 NOTE — Telephone Encounter (Signed)
See my notes under 8/23 refill encounter.

## 2016-06-08 ENCOUNTER — Other Ambulatory Visit: Payer: Self-pay | Admitting: Geriatric Medicine

## 2016-06-08 DIAGNOSIS — I340 Nonrheumatic mitral (valve) insufficiency: Secondary | ICD-10-CM

## 2016-06-21 ENCOUNTER — Other Ambulatory Visit (HOSPITAL_COMMUNITY): Payer: Self-pay

## 2016-07-27 NOTE — Progress Notes (Signed)
Electrophysiology Office Note Date: 07/30/2016  ID:  Tony [name], DOB Dec 26, 1950, MRN [id number]  PCP: [name], MD Primary Cardiologist: Claiborne Billings Electrophysiologist: [place]  CC: Pacemaker follow-up  Tony [name] is a 66 y.o. male seen today for Dr [name].  He presents today for routine electrophysiology followup.  Since last being seen in our clinic, the patient reports doing very well. His wife has had a stroke and he has been her full time caregiver which has been stressful.  He denies chest pain, palpitations, dyspnea, PND, orthopnea, nausea, vomiting, dizziness, syncope, edema, weight gain, or early satiety.  Device History: MDT dual chamber PPM implanted 2016 for complete heart block    Past Medical History:  Diagnosis Date  . Cancer ([place])    thyroid  . Complication of anesthesia    1985 after Thyriodectomy hard time waking up  . Diabetes mellitus    taking meds  . GERD (gastroesophageal reflux disease)    on meds  . Hypertension   . Hypothyroidism    Had two surgeries for Cancer   Past Surgical History:  Procedure Laterality Date  . LUMBAR LAMINECTOMY/DECOMPRESSION MICRODISCECTOMY  07/19/2011   Procedure: LUMBAR LAMINECTOMY/DECOMPRESSION MICRODISCECTOMY 3 LEVELS;  Surgeon: Melina Schools, MD;  Location: [facility];  Service: Orthopedics;  Laterality: Left;  Lumbar three-Lumbar five LEFT DECOMPRESSION AND FORAMINOTOMY Lumbar three-four LEFT DISCECTOMY  . PERMANENT PACEMAKER INSERTION N/A 08/11/2014   MDT Adapta L implanted by Dr [name] for CHB  . Thyroidectomy x2      Current Outpatient Prescriptions  Medication Sig Dispense Refill  . acetaminophen (TYLENOL) 500 MG tablet Take 500 mg by mouth every 6 (six) hours as needed (pain).     Marland Kitchen amLODipine (NORVASC) 10 MG tablet Take 5 mg by mouth daily.     Marland Kitchen aspirin EC 81 MG tablet Take 81 mg by mouth daily.    Marland Kitchen atorvastatin (LIPITOR) 10 MG tablet Take 10 mg by mouth daily.    . cyclobenzaprine (FLEXERIL) 10 MG  tablet Take 10 mg by mouth 3 (three) times daily as needed. For muscle pain.    Marland Kitchen glipiZIDE (GLUCOTROL XL) 10 MG 24 hr tablet Take 10 mg by mouth daily.     . hydrochlorothiazide (HYDRODIURIL) 25 MG tablet Take 25 mg by mouth daily.    Marland Kitchen ibuprofen (ADVIL,MOTRIN) 800 MG tablet Take 800 mg by mouth every 8 (eight) hours as needed. For pain.    Marland Kitchen levothyroxine (SYNTHROID, LEVOTHROID) 175 MCG tablet Take 175 mcg by mouth daily before breakfast.    . mupirocin ointment (BACTROBAN) 2 % Apply 1 application topically daily.    Marland Kitchen omeprazole (PRILOSEC) 20 MG capsule Take 20 mg by mouth daily as needed. For acid reflux    . quinapril (ACCUPRIL) 20 MG tablet Take 20 mg by mouth at bedtime.     . sertraline (ZOLOFT) 100 MG tablet Take 100 mg by mouth daily.    . Tamsulosin HCl (FLOMAX) 0.4 MG CAPS Take 1 capsule (0.4 mg total) by mouth every morning. 30 capsule 0   No current facility-administered medications for this visit.     Allergies:   Crestor [rosuvastatin calcium] and Simvastatin   Social History: Social History   Social History  . Marital status: Married    Spouse name: jody  . Number of children: 2  . Years of education: college   Occupational History  . retired    Social History Main Topics  . Smoking status: Never Smoker  . Smokeless  tobacco: Not on file  . Alcohol use No  . Drug use: No  . Sexual activity: Not on file   Other Topics Concern  . Not on file   Social History Narrative  . No narrative on file    Family History: Family History  Problem Relation Age of Onset  . Anesthesia problems Neg Hx   . Hypotension Neg Hx   . Pseudochol deficiency Neg Hx   . Lung cancer Mother   . Heart disease Father   . Heart attack Father      Review of Systems: All other systems reviewed and are otherwise negative except as noted above.   Physical Exam: VS:  BP 130/80   Pulse 63   Ht 6' (1.829 m)   Wt 245 lb 4 oz (111.2 kg)   SpO2 97%   BMI 33.26 kg/m  , BMI Body  mass index is 33.26 kg/m.  GEN- The patient is well appearing, alert and oriented x 3 today.   HEENT: normocephalic, atraumatic; sclera clear, conjunctiva pink; hearing intact; oropharynx clear; neck supple  Lungs- Clear to ausculation bilaterally, normal work of breathing.  No wheezes, rales, rhonchi Heart- Regular rate and rhythm, 2/6 SEM GI- soft, non-tender, non-distended, bowel sounds present  Extremities- no clubbing, cyanosis, or edema  MS- no significant deformity or atrophy Skin- warm and dry, no rash or lesion; PPM pocket well healed Psych- euthymic mood, full affect Neuro- strength and sensation are intact  PPM Interrogation- reviewed in detail today,  See PACEART report  EKG:  EKG is ordered today. The ekg ordered today shows sinus rhythm with V pacing   Recent Labs: 12/21/2015: Hemoglobin 13.8   Wt Readings from Last 3 Encounters:  07/30/16 245 lb 4 oz (111.2 kg)  12/21/15 244 lb (110.7 kg)  11/24/14 248 lb 12.8 oz (112.9 kg)     Other studies Reviewed: Additional studies/ records that were reviewed today include: Dr Jackalyn Lombard office notes  Assessment and Plan:  1.  Complete heart block  Normal PPM function See Pace Art report No changes today The importance of regular device follow up was reinforced with the patient today   2.  HTN Stable No change required today  3.  Moderate AS Repeat echo ordered by Dr [name], scheduled for next week  4.  Atrial tachycardia Minimally symptomatic and seen on device interrogation today Burden very low Will follow over time No atrial fibrillation identified to date     Current medicines are reviewed at length with the patient today.   The patient does not have concerns regarding his medicines.  The following changes were made today:  none  Labs/ tests ordered today include: none Orders Placed This Encounter  Procedures  . CUP PACEART [place]  . EKG 12-Lead     Disposition:   Follow up with  [facility] (we reviewed the [facility] Smart app today), Dr [name] 1 year    Signed, Chanetta Marshall, NP 07/30/2016 10:44 AM  [facility] [address] [place] [address] [phone] (office) [phone] (fax)

## 2016-07-30 ENCOUNTER — Encounter: Payer: Self-pay | Admitting: Nurse Practitioner

## 2016-07-30 ENCOUNTER — Ambulatory Visit (INDEPENDENT_AMBULATORY_CARE_PROVIDER_SITE_OTHER): Payer: Medicare Other | Admitting: Nurse Practitioner

## 2016-07-30 VITALS — BP 130/80 | HR 63 | Ht 72.0 in | Wt 245.2 lb

## 2016-07-30 DIAGNOSIS — I442 Atrioventricular block, complete: Secondary | ICD-10-CM | POA: Diagnosis not present

## 2016-07-30 DIAGNOSIS — I10 Essential (primary) hypertension: Secondary | ICD-10-CM | POA: Diagnosis not present

## 2016-07-30 DIAGNOSIS — I471 Supraventricular tachycardia: Secondary | ICD-10-CM

## 2016-07-30 DIAGNOSIS — I350 Nonrheumatic aortic (valve) stenosis: Secondary | ICD-10-CM

## 2016-07-30 LAB — CUP PACEART INCLINIC DEVICE CHECK
Implantable Lead Implant Date: 20160413
Implantable Lead Location: 753859
Implantable Lead Location: 753860
Implantable Lead Model: 5076
Implantable Pulse Generator Implant Date: 20160413
MDC IDC LEAD IMPLANT DT: 20160413
MDC IDC SESS DTM: 20180402104355

## 2016-07-30 MED ORDER — QUINAPRIL HCL 20 MG PO TABS: 20.0000 mg | ORAL_TABLET | Freq: Every day | ORAL | 6 refills | Status: DC

## 2016-07-30 NOTE — Patient Instructions (Signed)
Medication Instructions:  None Ordered   Labwork: None Ordered   Testing/Procedures: None Ordered   Follow-Up: Remote monitoring is used to monitor your Pacemaker of from home. This monitoring reduces the number of office visits required to check your device to one time per year. It allows [place] to keep an eye on the functioning of your device to ensure it is working properly. You are scheduled for a device check from home on 10/29/2016. You may send your transmission at any time that day. If you have a wireless device, the transmission will be sent automatically. After your physician reviews your transmission, you will receive a postcard with your next transmission date.  Your physician wants you to follow-up in: 1 year with Dr. [name]. You will receive a reminder letter in the mail two months in advance. If you don't receive a letter, please call our office to schedule the follow-up appointment.   Any Other Special Instructions Will Be Listed Below (If Applicable).     If you need a refill on your cardiac medications before your next appointment, please call your pharmacy.

## 2016-08-07 ENCOUNTER — Other Ambulatory Visit: Payer: Self-pay

## 2016-08-07 ENCOUNTER — Ambulatory Visit (HOSPITAL_COMMUNITY): Payer: Medicare Other | Attending: Internal Medicine

## 2016-08-07 DIAGNOSIS — I340 Nonrheumatic mitral (valve) insufficiency: Secondary | ICD-10-CM | POA: Diagnosis present

## 2016-08-07 DIAGNOSIS — Z950 Presence of cardiac pacemaker: Secondary | ICD-10-CM | POA: Insufficient documentation

## 2016-08-07 DIAGNOSIS — I083 Combined rheumatic disorders of mitral, aortic and tricuspid valves: Secondary | ICD-10-CM | POA: Diagnosis not present

## 2017-04-08 ENCOUNTER — Ambulatory Visit (INDEPENDENT_AMBULATORY_CARE_PROVIDER_SITE_OTHER): Payer: Medicare Other | Admitting: Orthopaedic Surgery

## 2017-04-09 ENCOUNTER — Ambulatory Visit (INDEPENDENT_AMBULATORY_CARE_PROVIDER_SITE_OTHER): Payer: Medicare Other | Admitting: Orthopaedic Surgery

## 2017-04-09 ENCOUNTER — Encounter (INDEPENDENT_AMBULATORY_CARE_PROVIDER_SITE_OTHER): Payer: Self-pay | Admitting: Orthopaedic Surgery

## 2017-04-09 VITALS — BP 135/81 | HR 87 | Resp 14 | Ht 71.5 in | Wt 244.0 lb

## 2017-04-09 DIAGNOSIS — M79671 Pain in right foot: Secondary | ICD-10-CM | POA: Diagnosis not present

## 2017-04-09 NOTE — Progress Notes (Signed)
Office Visit Note   Patient: Tony [name]           Date of Birth: [birth year]           MRN: [id number] Visit Date: 04/09/2017              Requested by: Lajean Manes, MD [address] [place], [place] [address] PCP: Lajean Manes, MD   Assessment & Plan: Visit Diagnoses:  1. Right foot pain     Plan: Probable synovial cyst from the IP joint right great toe. No resolution with local cortisone injection. Patient would like to have it excised. Discussed at length the excision. With his history of diabetes and some neuropathy there is always a risk of infection. Surgery could be performed with a local anesthetic. He would like to proceed   Orders:  No orders of the defined types were placed in this encounter.  No orders of the defined types were placed in this encounter.     Procedures: No procedures performed   Clinical Data: No additional findings.   Subjective: Chief Complaint  Patient presents with  . Right Foot - Pain    Mr. Tony [name] is a 66 y o here today for Right great toe ganglion cyst. Saw Dr. Sharol Given 3 yrs ago and told to cover and F/U.  Pt diabetic and cannot use cortisone.  Linna Hoff relates that he's had this "cyst" present for well over a year at the level of the PIP joint of the right great toe. He's had prior cortisone injection which elevated his blood sugar. This "cyst" will calm and go and oftentimes be very uncomfortable. He is at the point where he like to have it removed. Does have a history of type 2 diabetes with some neuropathy  HPI  Review of Systems  Constitutional: Negative for fatigue.  HENT: Negative for hearing loss.   Eyes: Positive for visual disturbance.  Respiratory: Negative for apnea, chest tightness and shortness of breath.   Cardiovascular: Negative for chest pain, palpitations and leg swelling.  Gastrointestinal: Negative for blood in stool, constipation and diarrhea.  Genitourinary: Negative for difficulty urinating.    Musculoskeletal: Negative for arthralgias, back pain, joint swelling, myalgias, neck pain and neck stiffness.  Neurological: Negative for weakness, numbness and headaches.  Hematological: Does not bruise/bleed easily.  Psychiatric/Behavioral: Negative for sleep disturbance. The patient is not nervous/anxious.      Objective: Vital Signs: BP 135/81   Pulse 87   Resp 14   Ht 5' 11.5" (1.816 m)   Wt 244 lb (110.7 kg)   BMI 33.56 kg/m   Physical Exam  Ortho Exam mass is present on the dorso medial aspect of the IP joint of the right great toe. No drainage. No erythema. No evidence of cellulitis. No loss of motion of the IP joint. Some burning in the great toe consistent with his neuropathy but good capillary refill. No pain.. Nail intact No specialty comments available.  Imaging: No results found.   PMFS History: Patient Active Problem List   Diagnosis Date Noted  . Ganglion cyst 12/19/2015  . Aortic stenosis 09/01/2014  . Essential hypertension 09/01/2014  . Type 2 diabetes mellitus with peripheral neuropathy ([place]) 09/01/2014  . Hypothyroidism 09/01/2014  . OSA on CPAP 09/01/2014  . CHB (complete heart block) ([place]) 08/11/2014  . Complete heart block ([place]) 08/05/2014  . Lumbar stenosis 07/16/2011  . HNP (herniated nucleus pulposus), lumbar 07/16/2011   Past Medical History:  Diagnosis Date  .  Cancer ([place])    thyroid  . Complete heart block ([facility])    a. s/p MDT dual chamber PPM followed by Dr [name]   . Complication of anesthesia    1985 after Thyriodectomy hard time waking up  . Diabetes mellitus   . GERD (gastroesophageal reflux disease)   . Hypertension   . Hypothyroidism    Had two surgeries for Cancer    Family History  Problem Relation Age of Onset  . Anesthesia problems Neg Hx   . Hypotension Neg Hx   . Pseudochol deficiency Neg Hx   . Lung cancer Mother   . Heart disease Father   . Heart attack Father     Past Surgical History:  Procedure Laterality  Date  . LUMBAR LAMINECTOMY/DECOMPRESSION MICRODISCECTOMY  07/19/2011   Procedure: LUMBAR LAMINECTOMY/DECOMPRESSION MICRODISCECTOMY 3 LEVELS;  Surgeon: Melina Schools, MD;  Location: [facility];  Service: Orthopedics;  Laterality: Left;  Lumbar three-Lumbar five LEFT DECOMPRESSION AND FORAMINOTOMY Lumbar three-four LEFT DISCECTOMY  . PERMANENT PACEMAKER INSERTION N/A 08/11/2014   MDT Adapta L implanted by Dr [name] for CHB  . Thyroidectomy x2     Social History   Occupational History  . Occupation: retired  Tobacco Use  . Smoking status: Never Smoker  . Smokeless tobacco: Never Used  Substance and Sexual Activity  . Alcohol use: No    Alcohol/week: 0.0 oz  . Drug use: No  . Sexual activity: Not on file

## 2017-04-18 DIAGNOSIS — M71371 Other bursal cyst, right ankle and foot: Secondary | ICD-10-CM | POA: Diagnosis not present

## 2017-04-25 ENCOUNTER — Telehealth (INDEPENDENT_AMBULATORY_CARE_PROVIDER_SITE_OTHER): Payer: Self-pay

## 2017-04-25 NOTE — Telephone Encounter (Signed)
Pt called, returned call and PW wants him to come in Friday. LVMOM for pt to return our call to verify he will be in the am.

## 2017-04-26 ENCOUNTER — Ambulatory Visit (INDEPENDENT_AMBULATORY_CARE_PROVIDER_SITE_OTHER): Payer: Medicare Other | Admitting: Orthopaedic Surgery

## 2017-04-26 ENCOUNTER — Encounter (INDEPENDENT_AMBULATORY_CARE_PROVIDER_SITE_OTHER): Payer: Self-pay | Admitting: Orthopaedic Surgery

## 2017-04-26 VITALS — BP 158/75 | HR 68 | Resp 14 | Ht 72.0 in | Wt 220.0 lb

## 2017-04-26 DIAGNOSIS — M79671 Pain in right foot: Secondary | ICD-10-CM

## 2017-04-26 NOTE — Progress Notes (Signed)
Office Visit Note   Patient: Tony [name]           Date of Birth: [birth year]           MRN: [id number] Visit Date: 04/26/2017              Requested by: Lajean Manes, MD [address] [place], [place] [address] PCP: Lajean Manes, MD   Assessment & Plan: Visit Diagnoses:  1. Right foot pain     Plan: 8 days status post excision of a synovial cyst from PIP joint of the right great toe and doing well. No related problems. We'll remove stitches and apply Steri-Strips over benzoin. Keep toe dry at least another week may return as needed. I am always concerned about infection given that day and is diabetic but presently the wound looks just fine  Follow-Up Instructions: Return if symptoms worsen or fail to improve.   Orders:  No orders of the defined types were placed in this encounter.  No orders of the defined types were placed in this encounter.     Procedures: No procedures performed   Clinical Data: No additional findings.   Subjective: Chief Complaint  Patient presents with  . Right Foot - Routine Post Op    Mr. Tony [name] is a 66 y o S/P 8 day Right great toe cyst.    HPI  Review of Systems  Constitutional: Negative for fatigue.  HENT: Negative for hearing loss.   Respiratory: Negative for apnea, chest tightness and shortness of breath.   Cardiovascular: Negative for chest pain, palpitations and leg swelling.  Gastrointestinal: Negative for blood in stool, constipation and diarrhea.  Genitourinary: Negative for difficulty urinating.  Musculoskeletal: Negative for arthralgias, back pain, joint swelling, myalgias, neck pain and neck stiffness.  Neurological: Negative for weakness, numbness and headaches.  Hematological: Does not bruise/bleed easily.  Psychiatric/Behavioral: Negative for sleep disturbance. The patient is not nervous/anxious.      Objective: Vital Signs: BP (!) 158/75   Pulse 68   Resp 14   Ht 6' (1.829 m)   Wt 220 lb  (99.8 kg)   BMI 29.84 kg/m   Physical Exam  Ortho Exam right great toe incision is healing nicely. Stitches were removed. Steri-Strips applied over benzoin. Neurovascular exam intact. No pain. No swelling  Specialty Comments:  No specialty comments available.  Imaging: No results found.   PMFS History: Patient Active Problem List   Diagnosis Date Noted  . Ganglion cyst 12/19/2015  . Aortic stenosis 09/01/2014  . Essential hypertension 09/01/2014  . Type 2 diabetes mellitus with peripheral neuropathy ([facility]) 09/01/2014  . Hypothyroidism 09/01/2014  . OSA on CPAP 09/01/2014  . CHB (complete heart block) ([place]) 08/11/2014  . Complete heart block ([place]) 08/05/2014  . Lumbar stenosis 07/16/2011  . HNP (herniated nucleus pulposus), lumbar 07/16/2011   Past Medical History:  Diagnosis Date  . Cancer ([facility])    thyroid  . Complete heart block ([facility])    a. s/p MDT dual chamber PPM followed by Dr [name]   . Complication of anesthesia    1985 after Thyriodectomy hard time waking up  . Diabetes mellitus   . GERD (gastroesophageal reflux disease)   . Hypertension   . Hypothyroidism    Had two surgeries for Cancer    Family History  Problem Relation Age of Onset  . Anesthesia problems Neg Hx   . Hypotension Neg Hx   . Pseudochol deficiency Neg Hx   .  Lung cancer Mother   . Heart disease Father   . Heart attack Father     Past Surgical History:  Procedure Laterality Date  . LUMBAR LAMINECTOMY/DECOMPRESSION MICRODISCECTOMY  07/19/2011   Procedure: LUMBAR LAMINECTOMY/DECOMPRESSION MICRODISCECTOMY 3 LEVELS;  Surgeon: Melina Schools, MD;  Location: [facility];  Service: Orthopedics;  Laterality: Left;  Lumbar three-Lumbar five LEFT DECOMPRESSION AND FORAMINOTOMY Lumbar three-four LEFT DISCECTOMY  . PERMANENT PACEMAKER INSERTION N/A 08/11/2014   MDT Adapta L implanted by Dr [name] for CHB  . Thyroidectomy x2     Social History   Occupational History  . Occupation: retired  Tobacco Use    . Smoking status: Never Smoker  . Smokeless tobacco: Never Used  Substance and Sexual Activity  . Alcohol use: No    Alcohol/week: 0.0 oz  . Drug use: No  . Sexual activity: Not on file

## 2017-05-02 ENCOUNTER — Encounter (INDEPENDENT_AMBULATORY_CARE_PROVIDER_SITE_OTHER): Payer: Self-pay | Admitting: Orthopaedic Surgery

## 2017-05-02 ENCOUNTER — Ambulatory Visit (INDEPENDENT_AMBULATORY_CARE_PROVIDER_SITE_OTHER): Payer: Medicare Other | Admitting: Orthopaedic Surgery

## 2017-05-02 ENCOUNTER — Telehealth (INDEPENDENT_AMBULATORY_CARE_PROVIDER_SITE_OTHER): Payer: Self-pay | Admitting: Orthopaedic Surgery

## 2017-05-02 VITALS — Resp 16 | Ht 72.0 in | Wt 220.0 lb

## 2017-05-02 DIAGNOSIS — M79671 Pain in right foot: Secondary | ICD-10-CM

## 2017-05-02 NOTE — Telephone Encounter (Signed)
LMOM for patient to come into office to be seen ASAP.

## 2017-05-02 NOTE — Progress Notes (Signed)
Office Visit Note   Patient: Tony [name]           Date of Birth: 08-26-1950           MRN: [id number] Visit Date: 05/02/2017              Requested by: Lajean Manes, MD [address] [place], [place] [address] PCP: Lajean Manes, MD   Assessment & Plan: Visit Diagnoses:  1. Right foot pain     Plan: 2 weeks status post excision of a synovial cyst from the IP joint of right great toe. He's had a little bit of separation of the wound but without evidence of infection appears to be superficial; use a waterproof Band-Aid and applied be bulbous and once a day check him back in the office. There is no evidence of any purulence or erythema around the wound Follow-Up Instructions: Return in about 1 week (around 05/09/2017).   Orders:  No orders of the defined types were placed in this encounter.  No orders of the defined types were placed in this encounter.     Procedures: No procedures performed   Clinical Data: No additional findings.   Subjective: Chief Complaint  Patient presents with  . Right Foot - Routine Post Op    Tony [name] is a 67 y o S/P 2 weeks Right great toe surgery. He is here today for R toe pain    HPI  Review of Systems  Constitutional: Negative for fatigue.  HENT: Negative for hearing loss.   Respiratory: Negative for apnea, chest tightness and shortness of breath.   Cardiovascular: Negative for chest pain, palpitations and leg swelling.  Gastrointestinal: Negative for blood in stool, constipation and diarrhea.  Genitourinary: Negative for difficulty urinating.  Musculoskeletal: Negative for arthralgias, back pain, joint swelling, myalgias, neck pain and neck stiffness.  Neurological: Negative for weakness, numbness and headaches.  Hematological: Does not bruise/bleed easily.  Psychiatric/Behavioral: Negative for sleep disturbance. The patient is not nervous/anxious.      Objective: Vital Signs: Resp 16   Ht 6' (1.829 m)    Wt 220 lb (99.8 kg)   BMI 29.84 kg/m   Physical Exam  Ortho Exam slight separation around the incision of the IP joint the right great toe. No drainage. No erythema or evidence of obvious cellulitis or infection I debrided some of the superficial extraneous skin and applied a waterproof Band-Aid with mupropicin  No specialty comments available.  Imaging: No results found.   PMFS History: Patient Active Problem List   Diagnosis Date Noted  . Ganglion cyst 12/19/2015  . Aortic stenosis 09/01/2014  . Essential hypertension 09/01/2014  . Type 2 diabetes mellitus with peripheral neuropathy ([facility]) 09/01/2014  . Hypothyroidism 09/01/2014  . OSA on CPAP 09/01/2014  . CHB (complete heart block) ([facility]) 08/11/2014  . Complete heart block ([facility]) 08/05/2014  . Lumbar stenosis 07/16/2011  . HNP (herniated nucleus pulposus), lumbar 07/16/2011   Past Medical History:  Diagnosis Date  . Cancer ([facility])    thyroid  . Complete heart block (HCC)    a. s/p MDT dual chamber PPM followed by Dr [name]   . Complication of anesthesia    1985 after Thyriodectomy hard time waking up  . Diabetes mellitus   . GERD (gastroesophageal reflux disease)   . Hypertension   . Hypothyroidism    Had two surgeries for Cancer    Family History  Problem Relation Age of Onset  . Anesthesia problems Neg  Hx   . Hypotension Neg Hx   . Pseudochol deficiency Neg Hx   . Lung cancer Mother   . Heart disease Father   . Heart attack Father     Past Surgical History:  Procedure Laterality Date  . LUMBAR LAMINECTOMY/DECOMPRESSION MICRODISCECTOMY  07/19/2011   Procedure: LUMBAR LAMINECTOMY/DECOMPRESSION MICRODISCECTOMY 3 LEVELS;  Surgeon: Melina Schools, MD;  Location: [facility];  Service: Orthopedics;  Laterality: Left;  Lumbar three-Lumbar five LEFT DECOMPRESSION AND FORAMINOTOMY Lumbar three-four LEFT DISCECTOMY  . PERMANENT PACEMAKER INSERTION N/A 08/11/2014   MDT Adapta L implanted by Dr [name] for CHB  . Thyroidectomy x2      Social History   Occupational History  . Occupation: retired  Tobacco Use  . Smoking status: Never Smoker  . Smokeless tobacco: Never Used  Substance and Sexual Activity  . Alcohol use: No    Alcohol/week: 0.0 oz  . Drug use: No  . Sexual activity: Not on file

## 2017-05-02 NOTE — Telephone Encounter (Signed)
pls read message

## 2017-05-02 NOTE — Telephone Encounter (Signed)
Patient left a message stating that the steri-strips came off his toe and he is concerned because it looks "funny."  CB [phone]

## 2017-05-10 ENCOUNTER — Encounter (INDEPENDENT_AMBULATORY_CARE_PROVIDER_SITE_OTHER): Payer: Self-pay | Admitting: Orthopaedic Surgery

## 2017-05-10 ENCOUNTER — Ambulatory Visit (INDEPENDENT_AMBULATORY_CARE_PROVIDER_SITE_OTHER): Payer: Medicare Other | Admitting: Orthopaedic Surgery

## 2017-05-10 VITALS — BP 144/63 | HR 63 | Ht 72.0 in

## 2017-05-10 DIAGNOSIS — M79671 Pain in right foot: Secondary | ICD-10-CM

## 2017-05-10 NOTE — Progress Notes (Signed)
Office Visit Note   Patient: Tony [name]           Date of Birth: 01-Sep-1950           MRN: [id number] Visit Date: 05/10/2017              Requested by: Lajean Manes, MD [address] [place], [place] [address] PCP: Lajean Manes, MD   Assessment & Plan: Visit Diagnoses:  1. Right foot pain     Plan: Several weeks status post excision of a synovial cyst cyst from the IP joint of the right great toe and doing well. Held little opening of the wound postoperatively with been treating this with mupropicin and a Band-Aid and doing well at this point. Plan to see him back as needed  Follow-Up Instructions: Return if symptoms worsen or fail to improve.   Orders:  No orders of the defined types were placed in this encounter.  No orders of the defined types were placed in this encounter.     Procedures: No procedures performed   Clinical Data: No additional findings.   Subjective: Chief Complaint  Patient presents with  . Follow-up    R GREAT TOW WAS SORE FOR 1 DAY BUT IS GETTING BETTER  Being followed for potential wound problems right great toe after excision of synovial cyst. No fever chills redness or significant pain. Using mupropicin  HPI  Review of Systems  Constitutional: Negative.   HENT: Negative.   Respiratory: Negative.   Cardiovascular: Negative.   Gastrointestinal: Negative.   Genitourinary: Negative.   Musculoskeletal: Negative.   Skin: Negative.   Neurological: Negative.   Psychiatric/Behavioral: Positive for sleep disturbance.     Objective: Vital Signs: BP (!) 144/63 (BP Location: Left Arm, Patient Position: Sitting, Cuff Size: Normal)   Pulse 63   Ht 6' (1.829 m)   BMI 29.84 kg/m   Physical Exam  Ortho Exam right great toe wound is filling in nicely without evidence of infection. No drainage. Good sensibility. No pain. No redness. Specialty Comments:  No specialty comments available.  Imaging: No results  found.   PMFS History: Patient Active Problem List   Diagnosis Date Noted  . Ganglion cyst 12/19/2015  . Aortic stenosis 09/01/2014  . Essential hypertension 09/01/2014  . Type 2 diabetes mellitus with peripheral neuropathy ([facility]) 09/01/2014  . Hypothyroidism 09/01/2014  . OSA on CPAP 09/01/2014  . CHB (complete heart block) ([facility]) 08/11/2014  . Complete heart block ([facility]) 08/05/2014  . Lumbar stenosis 07/16/2011  . HNP (herniated nucleus pulposus), lumbar 07/16/2011   Past Medical History:  Diagnosis Date  . Cancer ([facility])    thyroid  . Complete heart block (H[facility])    a. s/p MDT dual chamber PPM followed by Dr [name]   . Complication of anesthesia    1985 after Thyriodectomy hard time waking up  . Diabetes mellitus   . GERD (gastroesophageal reflux disease)   . Hypertension   . Hypothyroidism    Had two surgeries for Cancer    Family History  Problem Relation Age of Onset  . Anesthesia problems Neg Hx   . Hypotension Neg Hx   . Pseudochol deficiency Neg Hx   . Lung cancer Mother   . Heart disease Father   . Heart attack Father     Past Surgical History:  Procedure Laterality Date  . LUMBAR LAMINECTOMY/DECOMPRESSION MICRODISCECTOMY  07/19/2011   Procedure: LUMBAR LAMINECTOMY/DECOMPRESSION MICRODISCECTOMY 3 LEVELS;  Surgeon: Melina Schools, MD;  Location: [facility];  Service: Orthopedics;  Laterality: Left;  Lumbar three-Lumbar five LEFT DECOMPRESSION AND FORAMINOTOMY Lumbar three-four LEFT DISCECTOMY  . PERMANENT PACEMAKER INSERTION N/A 08/11/2014   MDT Adapta L implanted by Dr [name] for CHB  . Thyroidectomy x2     Social History   Occupational History  . Occupation: retired  Tobacco Use  . Smoking status: Never Smoker  . Smokeless tobacco: Never Used  Substance and Sexual Activity  . Alcohol use: No    Alcohol/week: 0.0 oz  . Drug use: No  . Sexual activity: Not on file

## 2017-05-24 ENCOUNTER — Ambulatory Visit (INDEPENDENT_AMBULATORY_CARE_PROVIDER_SITE_OTHER): Payer: Medicare Other | Admitting: Orthopaedic Surgery

## 2017-05-24 ENCOUNTER — Encounter (INDEPENDENT_AMBULATORY_CARE_PROVIDER_SITE_OTHER): Payer: Self-pay | Admitting: Orthopaedic Surgery

## 2017-05-24 VITALS — Resp 14 | Ht 71.0 in | Wt 200.0 lb

## 2017-05-24 DIAGNOSIS — M79671 Pain in right foot: Secondary | ICD-10-CM

## 2017-05-24 MED ORDER — DOXYCYCLINE HYCLATE 100 MG PO TBEC: 100.0000 mg | DELAYED_RELEASE_TABLET | Freq: Two times a day (BID) | ORAL | 0 refills | Status: DC

## 2017-05-24 NOTE — Progress Notes (Signed)
Office Visit Note   Patient: Tony [name]           Date of Birth: 1950/06/10           MRN: [id number] Visit Date: 05/24/2017              Requested by: Lajean Manes, MD [address] [place], [place] [address] PCP: Lajean Manes, MD   Assessment & Plan: Visit Diagnoses:  1. Right foot pain     Plan: One month status post excision of a synovial cyst from the IP joint of the great toe. He was doing well until this week when he developed a little redness in the area of the old incision. He said had very minimal drainage. Appears that he may have an early cellulitis on placing him on doxycycline and no continued use. mupropicin  and wearing a wooden shoe. Check him back in 7-10 days Follow-Up Instructions: Return in about 1 week (around 05/31/2017).   Orders:  No orders of the defined types were placed in this encounter.  Meds ordered this encounter  Medications  . doxycycline (DORYX) 100 MG EC tablet    Sig: Take 1 tablet (100 mg total) by mouth 2 (two) times daily.    Dispense:  20 tablet    Refill:  0      Procedures: No procedures performed   Clinical Data: No additional findings.   Subjective: Chief Complaint  Patient presents with  . Right Foot - Toe Pain    HPI  Review of Systems  Constitutional: Negative for fatigue.  HENT: Negative for hearing loss.   Respiratory: Negative for apnea, chest tightness and shortness of breath.   Cardiovascular: Negative for chest pain, palpitations and leg swelling.  Gastrointestinal: Negative for blood in stool, constipation and diarrhea.  Genitourinary: Negative for difficulty urinating.  Musculoskeletal: Negative for arthralgias, back pain, joint swelling, myalgias, neck pain and neck stiffness.  Neurological: Negative for weakness, numbness and headaches.  Hematological: Does not bruise/bleed easily.  Psychiatric/Behavioral: Negative for sleep disturbance. The patient is not nervous/anxious.       Objective: Vital Signs: Resp 14   Ht 5\' 11"  (1.803 m)   Wt 200 lb (90.7 kg)   BMI 27.89 kg/m   Physical Exam  Ortho Exam examination the right foot and specifically the great toe demonstrates good capillary refill. There is a very small open area representing the old incision about the medial aspect of the great toe IP joint. The some redness around the area but no streaking proximally. An. Good capillary refill to the tip of the toe. No drainage from that small area  Specialty Comments:  No specialty comments available.  Imaging: No results found.   PMFS History: Patient Active Problem List   Diagnosis Date Noted  . Ganglion cyst 12/19/2015  . Aortic stenosis 09/01/2014  . Essential hypertension 09/01/2014  . Type 2 diabetes mellitus with peripheral neuropathy ([facility]) 09/01/2014  . Hypothyroidism 09/01/2014  . OSA on CPAP 09/01/2014  . CHB (complete heart block) ([facility]) 08/11/2014  . Complete heart block ([facility]) 08/05/2014  . Lumbar stenosis 07/16/2011  . HNP (herniated nucleus pulposus), lumbar 07/16/2011   Past Medical History:  Diagnosis Date  . Cancer ([facility])    thyroid  . Complete heart block (HCC)    a. s/p MDT dual chamber PPM followed by Dr [name]   . Complication of anesthesia    1985 after Thyriodectomy hard time waking up  . Diabetes mellitus   .  GERD (gastroesophageal reflux disease)   . Hypertension   . Hypothyroidism    Had two surgeries for Cancer    Family History  Problem Relation Age of Onset  . Anesthesia problems Neg Hx   . Hypotension Neg Hx   . Pseudochol deficiency Neg Hx   . Lung cancer Mother   . Heart disease Father   . Heart attack Father     Past Surgical History:  Procedure Laterality Date  . LUMBAR LAMINECTOMY/DECOMPRESSION MICRODISCECTOMY  07/19/2011   Procedure: LUMBAR LAMINECTOMY/DECOMPRESSION MICRODISCECTOMY 3 LEVELS;  Surgeon: Melina Schools, MD;  Location: [facility];  Service: Orthopedics;  Laterality: Left;  Lumbar three-Lumbar  five LEFT DECOMPRESSION AND FORAMINOTOMY Lumbar three-four LEFT DISCECTOMY  . PERMANENT PACEMAKER INSERTION N/A 08/11/2014   MDT Adapta L implanted by Dr [name] for CHB  . Thyroidectomy x2     Social History   Occupational History  . Occupation: retired  Tobacco Use  . Smoking status: Never Smoker  . Smokeless tobacco: Never Used  Substance and Sexual Activity  . Alcohol use: No    Alcohol/week: 0.0 oz  . Drug use: No  . Sexual activity: Not on file

## 2017-06-03 ENCOUNTER — Encounter (INDEPENDENT_AMBULATORY_CARE_PROVIDER_SITE_OTHER): Payer: Self-pay | Admitting: Orthopaedic Surgery

## 2017-06-03 ENCOUNTER — Ambulatory Visit (INDEPENDENT_AMBULATORY_CARE_PROVIDER_SITE_OTHER): Payer: Medicare Other | Admitting: Orthopaedic Surgery

## 2017-06-03 DIAGNOSIS — M79671 Pain in right foot: Secondary | ICD-10-CM

## 2017-06-03 MED ORDER — DOXYCYCLINE HYCLATE 100 MG PO TBEC: 100.0000 mg | DELAYED_RELEASE_TABLET | Freq: Two times a day (BID) | ORAL | 0 refills | Status: DC

## 2017-06-03 NOTE — Progress Notes (Signed)
Office Visit Note   Patient: Tony [name]           Date of Birth: May 24, 1950           MRN: [id number] Visit Date: 06/03/2017              Requested by: Lajean Manes, MD [address] [place], [place] [address] PCP: Lajean Manes, MD   Assessment & Plan: Visit Diagnoses:  1. Right foot pain     Plan: Status post removal of synovial cyst from right great toe IP joint. Developed some cellulitis which is improving with doxycycline. No evidence of ascending lymphangitis or pain incision is healing. Continue doxycycline office 1 week  Follow-Up Instructions: Return in about 1 week (around 06/10/2017).   Orders:  No orders of the defined types were placed in this encounter.  Meds ordered this encounter  Medications  . doxycycline (DORYX) 100 MG EC tablet    Sig: Take 1 tablet (100 mg total) by mouth 2 (two) times daily.    Dispense:  20 tablet    Refill:  0      Procedures: No procedures performed   Clinical Data: No additional findings.   Subjective: No chief complaint on file. Less pain and redness of right great toe. Continues to wear wooden shoe. Has been taking doxycycline. No new drainage  HPI  Review of Systems   Objective: Vital Signs: There were no vitals taken for this visit.  Physical Exam  Ortho Exam awake alert and oriented 3 comfortable sitting. No pain around the great toe. Swelling is less. More pink than red compared to last week. Several millimeter open area representing a partial healing of the synovial cyst excision has nearly closed  Specialty Comments:  No specialty comments available.  Imaging: No results found.   PMFS History: Patient Active Problem List   Diagnosis Date Noted  . Ganglion cyst 12/19/2015  . Aortic stenosis 09/01/2014  . Essential hypertension 09/01/2014  . Type 2 diabetes mellitus with peripheral neuropathy ([facility]) 09/01/2014  . Hypothyroidism 09/01/2014  . OSA on CPAP 09/01/2014  . CHB  (complete heart block) ([facility]) 08/11/2014  . Complete heart block ([facility]) 08/05/2014  . Lumbar stenosis 07/16/2011  . HNP (herniated nucleus pulposus), lumbar 07/16/2011   Past Medical History:  Diagnosis Date  . Cancer ([facility])    thyroid  . Complete heart block (HCC)    a. s/p MDT dual chamber PPM followed by Dr [name]   . Complication of anesthesia    1985 after Thyriodectomy hard time waking up  . Diabetes mellitus   . GERD (gastroesophageal reflux disease)   . Hypertension   . Hypothyroidism    Had two surgeries for Cancer    Family History  Problem Relation Age of Onset  . Anesthesia problems Neg Hx   . Hypotension Neg Hx   . Pseudochol deficiency Neg Hx   . Lung cancer Mother   . Heart disease Father   . Heart attack Father     Past Surgical History:  Procedure Laterality Date  . LUMBAR LAMINECTOMY/DECOMPRESSION MICRODISCECTOMY  07/19/2011   Procedure: LUMBAR LAMINECTOMY/DECOMPRESSION MICRODISCECTOMY 3 LEVELS;  Surgeon: Melina Schools, MD;  Location: [facility];  Service: Orthopedics;  Laterality: Left;  Lumbar three-Lumbar five LEFT DECOMPRESSION AND FORAMINOTOMY Lumbar three-four LEFT DISCECTOMY  . PERMANENT PACEMAKER INSERTION N/A 08/11/2014   MDT Adapta L implanted by Dr [name] for CHB  . Thyroidectomy x2     Social History  Occupational History  . Occupation: retired  Tobacco Use  . Smoking status: Never Smoker  . Smokeless tobacco: Never Used  Substance and Sexual Activity  . Alcohol use: No    Alcohol/week: 0.0 oz  . Drug use: No  . Sexual activity: Not on file     Garald Balding, MD   Note - This record has been created using [employer].  Chart creation errors have been sought, but may not always  have been located. Such creation errors do not reflect on  the standard of medical care.

## 2017-06-10 ENCOUNTER — Ambulatory Visit (INDEPENDENT_AMBULATORY_CARE_PROVIDER_SITE_OTHER): Payer: Medicare Other | Admitting: Orthopaedic Surgery

## 2017-06-10 ENCOUNTER — Encounter (INDEPENDENT_AMBULATORY_CARE_PROVIDER_SITE_OTHER): Payer: Self-pay | Admitting: Orthopaedic Surgery

## 2017-06-10 VITALS — Resp 17 | Ht 73.0 in | Wt 220.0 lb

## 2017-06-10 DIAGNOSIS — L089 Local infection of the skin and subcutaneous tissue, unspecified: Secondary | ICD-10-CM | POA: Insufficient documentation

## 2017-06-10 NOTE — Progress Notes (Signed)
Office Visit Note   Patient: Tony [name]           Date of Birth: 1950-06-25           MRN: [id number] Visit Date: 06/10/2017              Requested by: Lajean Manes, MD [address] [place], [place] [address] PCP: Lajean Manes, MD   Assessment & Plan: Visit Diagnoses:  1. Toe infection     Plan: Follow up for the postop infection right great toe after excision of the synovial cyst. He has been on doxycycline. No evidence of infection at present. Toe was not swollen or red. No pain and no drainage from the small wound. No prior culture has been taking doxycycline prophylactically. We'll check again in a week. He will finish his course of doxycycline  Follow-Up Instructions: No Follow-up on file.   Orders:  No orders of the defined types were placed in this encounter.  No orders of the defined types were placed in this encounter.     Procedures: No procedures performed   Clinical Data: No additional findings.   Subjective: Chief Complaint  Patient presents with  . Right Foot - Pain  Has 3 days left on his doxycycline dosage. Doing well  HPI  Review of Systems   Objective: Vital Signs: Resp 17   Ht 6\' 1"  (1.854 m)   Wt 220 lb (99.8 kg)   BMI 29.03 kg/m   Physical Exam  Ortho Exam right great toe was not hot red or swollen. No longer red but rather pink. The small 3 mm incision over the IP joint medially is not draining appears to be closing.  Specialty Comments:  No specialty comments available.  Imaging: No results found.   PMFS History: Patient Active Problem List   Diagnosis Date Noted  . Toe infection 06/10/2017  . Ganglion cyst 12/19/2015  . Aortic stenosis 09/01/2014  . Essential hypertension 09/01/2014  . Type 2 diabetes mellitus with peripheral neuropathy ([facility]) 09/01/2014  . Hypothyroidism 09/01/2014  . OSA on CPAP 09/01/2014  . CHB (complete heart block) ([facility]) 08/11/2014  . Complete heart block ([facility]) 08/05/2014    . Lumbar stenosis 07/16/2011  . HNP (herniated nucleus pulposus), lumbar 07/16/2011   Past Medical History:  Diagnosis Date  . Cancer ([facility])    thyroid  . Complete heart block (H[facility])    a. s/p MDT dual chamber PPM followed by Dr [name]   . Complication of anesthesia    1985 after Thyriodectomy hard time waking up  . Diabetes mellitus   . GERD (gastroesophageal reflux disease)   . Hypertension   . Hypothyroidism    Had two surgeries for Cancer    Family History  Problem Relation Age of Onset  . Anesthesia problems Neg Hx   . Hypotension Neg Hx   . Pseudochol deficiency Neg Hx   . Lung cancer Mother   . Heart disease Father   . Heart attack Father     Past Surgical History:  Procedure Laterality Date  . LUMBAR LAMINECTOMY/DECOMPRESSION MICRODISCECTOMY  07/19/2011   Procedure: LUMBAR LAMINECTOMY/DECOMPRESSION MICRODISCECTOMY 3 LEVELS;  Surgeon: Melina Schools, MD;  Location: [facility];  Service: Orthopedics;  Laterality: Left;  Lumbar three-Lumbar five LEFT DECOMPRESSION AND FORAMINOTOMY Lumbar three-four LEFT DISCECTOMY  . PERMANENT PACEMAKER INSERTION N/A 08/11/2014   MDT Adapta L implanted by Dr [name] for CHB  . Thyroidectomy x2     Social History  Occupational History  . Occupation: retired  Tobacco Use  . Smoking status: Never Smoker  . Smokeless tobacco: Never Used  Substance and Sexual Activity  . Alcohol use: No    Alcohol/week: 0.0 oz  . Drug use: No  . Sexual activity: Not on file     Garald Balding, MD   Note - This record has been created using [employer].  Chart creation errors have been sought, but may not always  have been located. Such creation errors do not reflect on  the standard of medical care.

## 2017-06-17 ENCOUNTER — Ambulatory Visit (INDEPENDENT_AMBULATORY_CARE_PROVIDER_SITE_OTHER): Payer: Medicare Other | Admitting: Orthopaedic Surgery

## 2017-06-17 ENCOUNTER — Encounter (INDEPENDENT_AMBULATORY_CARE_PROVIDER_SITE_OTHER): Payer: Self-pay | Admitting: Orthopaedic Surgery

## 2017-06-17 VITALS — BP 138/68 | HR 75 | Resp 16 | Ht 72.0 in | Wt 200.0 lb

## 2017-06-17 DIAGNOSIS — M6740 Ganglion, unspecified site: Secondary | ICD-10-CM

## 2017-06-17 NOTE — Progress Notes (Signed)
Office Visit Note   Patient: Tony [name]           Date of Birth: 10-13-1950           MRN: [id number] Visit Date: 06/17/2017              Requested by: Lajean Manes, MD [address] [place], [place] [address] PCP: Lajean Manes, MD   Assessment & Plan: Visit Diagnoses:  1. Ganglion cyst     Plan: Status post excision of synovial cyst from IP joint right great toe. Developed some drainage and possible infection with redness. Has been on 2 week course of doxycycline and is now asymptomatic. Appears that the wound is closed. We'll continue with mupropicin ointment and Band-Aid to wound and check him back in 2 weeks.  Follow-Up Instructions: Return in about 2 weeks (around 07/01/2017).   Orders:  No orders of the defined types were placed in this encounter.  No orders of the defined types were placed in this encounter.     Procedures: No procedures performed   Clinical Data: No additional findings.   Subjective: Chief Complaint  Patient presents with  . Right Foot - Pain, Bleeding/Bruising    Tony [name] is a 67 y o here because he rolled wife's wheelchair over x 3 days ago.   Completed 2 week course of doxycycline. Minimal tenderness over the incision no drainage. The wound is not open. No pain with range of motion of the interphalangeal joint or metatarsal phalangeal joint. No redness HPI  Review of Systems  Constitutional: Negative for fatigue.  HENT: Negative for hearing loss.   Respiratory: Negative for apnea, chest tightness and shortness of breath.   Cardiovascular: Negative for chest pain, palpitations and leg swelling.  Gastrointestinal: Negative for blood in stool, constipation and diarrhea.  Genitourinary: Negative for difficulty urinating.  Musculoskeletal: Positive for joint swelling. Negative for arthralgias, back pain, myalgias, neck pain and neck stiffness.  Neurological: Negative for weakness, numbness and headaches.    Hematological: Does not bruise/bleed easily.  Psychiatric/Behavioral: Negative for sleep disturbance. The patient is not nervous/anxious.      Objective: Vital Signs: BP 138/68   Pulse 75   Resp 16   Ht 6' (1.829 m)   Wt 200 lb (90.7 kg)   BMI 27.12 kg/m   Physical Exam  Ortho Exam awake alert and oriented 3 comfortable sitting. Small incision over the interphalangeal joint of the right great toe appears to be completely healed with out any open areas. Good capillary refill to toes. No redness. No swelling  Specialty Comments:  No specialty comments available.  Imaging: No results found.   PMFS History: Patient Active Problem List   Diagnosis Date Noted  . Toe infection 06/10/2017  . Ganglion cyst 12/19/2015  . Aortic stenosis 09/01/2014  . Essential hypertension 09/01/2014  . Type 2 diabetes mellitus with peripheral neuropathy ([facility]) 09/01/2014  . Hypothyroidism 09/01/2014  . OSA on CPAP 09/01/2014  . CHB (complete heart block) ([facility]) 08/11/2014  . Complete heart block ([facility]) 08/05/2014  . Lumbar stenosis 07/16/2011  . HNP (herniated nucleus pulposus), lumbar 07/16/2011   Past Medical History:  Diagnosis Date  . Cancer ([facility])    thyroid  . Complete heart block ([facility])    a. s/p MDT dual chamber PPM followed by Dr [name]   . Complication of anesthesia    1985 after Thyriodectomy hard time waking up  . Diabetes mellitus   . GERD (gastroesophageal reflux  disease)   . Hypertension   . Hypothyroidism    Had two surgeries for Cancer    Family History  Problem Relation Age of Onset  . Anesthesia problems Neg Hx   . Hypotension Neg Hx   . Pseudochol deficiency Neg Hx   . Lung cancer Mother   . Heart disease Father   . Heart attack Father     Past Surgical History:  Procedure Laterality Date  . LUMBAR LAMINECTOMY/DECOMPRESSION MICRODISCECTOMY  07/19/2011   Procedure: LUMBAR LAMINECTOMY/DECOMPRESSION MICRODISCECTOMY 3 LEVELS;  Surgeon: Melina Schools, MD;   Location: [facility];  Service: Orthopedics;  Laterality: Left;  Lumbar three-Lumbar five LEFT DECOMPRESSION AND FORAMINOTOMY Lumbar three-four LEFT DISCECTOMY  . PERMANENT PACEMAKER INSERTION N/A 08/11/2014   MDT Adapta L implanted by Dr [name] for CHB  . Thyroidectomy x2     Social History   Occupational History  . Occupation: retired  Tobacco Use  . Smoking status: Never Smoker  . Smokeless tobacco: Never Used  Substance and Sexual Activity  . Alcohol use: No    Alcohol/week: 0.0 oz  . Drug use: No  . Sexual activity: Not on file

## 2017-07-31 ENCOUNTER — Telehealth (INDEPENDENT_AMBULATORY_CARE_PROVIDER_SITE_OTHER): Payer: Self-pay | Admitting: Orthopaedic Surgery

## 2017-07-31 NOTE — Telephone Encounter (Signed)
Records 04/18/2017- present emailed to Charlette Caffey at [email or web address]

## 2017-08-13 ENCOUNTER — Other Ambulatory Visit (HOSPITAL_COMMUNITY): Payer: Self-pay | Admitting: Geriatric Medicine

## 2017-08-13 DIAGNOSIS — I350 Nonrheumatic aortic (valve) stenosis: Secondary | ICD-10-CM

## 2017-08-15 ENCOUNTER — Ambulatory Visit (HOSPITAL_COMMUNITY): Payer: Medicare Other | Attending: Cardiology

## 2017-08-15 ENCOUNTER — Other Ambulatory Visit: Payer: Self-pay

## 2017-08-15 DIAGNOSIS — E119 Type 2 diabetes mellitus without complications: Secondary | ICD-10-CM | POA: Diagnosis not present

## 2017-08-15 DIAGNOSIS — I350 Nonrheumatic aortic (valve) stenosis: Secondary | ICD-10-CM

## 2017-08-15 DIAGNOSIS — I10 Essential (primary) hypertension: Secondary | ICD-10-CM | POA: Diagnosis not present

## 2017-09-10 ENCOUNTER — Encounter: Payer: Self-pay | Admitting: Physician Assistant

## 2017-09-30 ENCOUNTER — Telehealth (INDEPENDENT_AMBULATORY_CARE_PROVIDER_SITE_OTHER): Payer: Self-pay | Admitting: Orthopaedic Surgery

## 2017-09-30 NOTE — Telephone Encounter (Signed)
Charlette Caffey @ [facility] called, didn't receive records I emailed to her in April. I faxed the records to her [phone]

## 2017-10-01 ENCOUNTER — Encounter

## 2017-10-01 ENCOUNTER — Ambulatory Visit: Payer: Medicare Other | Admitting: Physician Assistant

## 2017-10-16 ENCOUNTER — Ambulatory Visit (INDEPENDENT_AMBULATORY_CARE_PROVIDER_SITE_OTHER): Payer: Medicare Other | Admitting: Internal Medicine

## 2017-10-16 ENCOUNTER — Encounter: Payer: Self-pay | Admitting: Internal Medicine

## 2017-10-16 VITALS — BP 142/80 | HR 79 | Ht 72.0 in | Wt 252.0 lb

## 2017-10-16 DIAGNOSIS — Z950 Presence of cardiac pacemaker: Secondary | ICD-10-CM

## 2017-10-16 DIAGNOSIS — I471 Supraventricular tachycardia: Secondary | ICD-10-CM | POA: Diagnosis not present

## 2017-10-16 DIAGNOSIS — I442 Atrioventricular block, complete: Secondary | ICD-10-CM | POA: Diagnosis not present

## 2017-10-16 DIAGNOSIS — I350 Nonrheumatic aortic (valve) stenosis: Secondary | ICD-10-CM

## 2017-10-16 DIAGNOSIS — I10 Essential (primary) hypertension: Secondary | ICD-10-CM | POA: Diagnosis not present

## 2017-10-16 LAB — CUP PACEART INCLINIC DEVICE CHECK
Battery Impedance: 206 Ohm
Battery Voltage: 2.78 V
Brady Statistic AP VP Percent: 26 %
Brady Statistic AP VS Percent: 0 %
Brady Statistic AS VP Percent: 73 %
Implantable Lead Implant Date: 20160413
Implantable Lead Location: 753860
Implantable Lead Model: 5076
Implantable Lead Model: 5076
Lead Channel Impedance Value: 467 Ohm
Lead Channel Pacing Threshold Amplitude: 0.5 V
Lead Channel Pacing Threshold Amplitude: 0.5 V
Lead Channel Pacing Threshold Amplitude: 0.5 V
Lead Channel Pacing Threshold Amplitude: 0.625 V
Lead Channel Pacing Threshold Pulse Width: 0.4 ms
Lead Channel Pacing Threshold Pulse Width: 0.4 ms
Lead Channel Pacing Threshold Pulse Width: 0.4 ms
Lead Channel Sensing Intrinsic Amplitude: 11.2 mV
Lead Channel Sensing Intrinsic Amplitude: 4 mV
Lead Channel Setting Pacing Amplitude: 2.5 V
Lead Channel Setting Pacing Pulse Width: 0.4 ms
Lead Channel Setting Sensing Sensitivity: 4 mV
MDC IDC LEAD IMPLANT DT: 20160413
MDC IDC LEAD LOCATION: 753859
MDC IDC MSMT BATTERY REMAINING LONGEVITY: 113 mo
MDC IDC MSMT LEADCHNL RA PACING THRESHOLD PULSEWIDTH: 0.4 ms
MDC IDC MSMT LEADCHNL RV IMPEDANCE VALUE: 585 Ohm
MDC IDC PG IMPLANT DT: 20160413
MDC IDC SESS DTM: 20190619164323
MDC IDC SET LEADCHNL RA PACING AMPLITUDE: 2 V
MDC IDC STAT BRADY AS VS PERCENT: 1 %

## 2017-10-16 NOTE — Progress Notes (Signed)
PCP: Lajean Manes, MD Primary Cardiologist: Dr Claiborne Billings Primary EP:  Dr Venia Minks is a 67 y.o. male who presents today for routine electrophysiology followup.  Since last being seen in our clinic, the patient reports doing very well.  Today, he denies symptoms of palpitations, chest pain, shortness of breath,  lower extremity edema, dizziness, presyncope, or syncope.  The patient is otherwise without complaint today.   Past Medical History:  Diagnosis Date  . Cancer ([facility])    thyroid  . Complete heart block (H[facility])    a. s/p MDT dual chamber PPM followed by Dr [name]   . Complication of anesthesia    1985 after Thyriodectomy hard time waking up  . Diabetes mellitus   . GERD (gastroesophageal reflux disease)   . Hypertension   . Hypothyroidism    Had two surgeries for Cancer   Past Surgical History:  Procedure Laterality Date  . LUMBAR LAMINECTOMY/DECOMPRESSION MICRODISCECTOMY  07/19/2011   Procedure: LUMBAR LAMINECTOMY/DECOMPRESSION MICRODISCECTOMY 3 LEVELS;  Surgeon: Melina Schools, MD;  Location: [facility];  Service: Orthopedics;  Laterality: Left;  Lumbar three-Lumbar five LEFT DECOMPRESSION AND FORAMINOTOMY Lumbar three-four LEFT DISCECTOMY  . PERMANENT PACEMAKER INSERTION N/A 08/11/2014   MDT Adapta L implanted by Dr [name] for CHB  . Thyroidectomy x2      ROS- all systems are reviewed and negative except as per HPI above  Current Outpatient Medications  Medication Sig Dispense Refill  . acetaminophen (TYLENOL) 500 MG tablet Take 500 mg by mouth every 6 (six) hours as needed (pain).     Marland Kitchen amLODipine (NORVASC) 10 MG tablet Take 5 mg by mouth daily.     Marland Kitchen aspirin EC 81 MG tablet Take 81 mg by mouth daily.    Marland Kitchen atorvastatin (LIPITOR) 10 MG tablet Take 10 mg by mouth daily.    . cyclobenzaprine (FLEXERIL) 10 MG tablet Take 10 mg by mouth 3 (three) times daily as needed. For muscle pain.    Marland Kitchen doxycycline (DORYX) 100 MG EC tablet Take 1 tablet (100 mg total) by mouth 2  (two) times daily. 20 tablet 0  . glipiZIDE (GLUCOTROL) 10 MG tablet     . hydrochlorothiazide (HYDRODIURIL) 25 MG tablet Take 25 mg by mouth daily.    Marland Kitchen ibuprofen (ADVIL,MOTRIN) 800 MG tablet Take 800 mg by mouth every 8 (eight) hours as needed. For pain.    . INVOKANA 100 MG TABS tablet     . levothyroxine (SYNTHROID, LEVOTHROID) 175 MCG tablet Take 175 mcg by mouth daily before breakfast.    . mupirocin ointment (BACTROBAN) 2 % Apply 1 application topically daily.    Marland Kitchen omeprazole (PRILOSEC) 20 MG capsule Take 20 mg by mouth daily as needed. For acid reflux    . quinapril (ACCUPRIL) 20 MG tablet Take 1 tablet (20 mg total) by mouth at bedtime. 30 tablet 6  . sertraline (ZOLOFT) 100 MG tablet Take 100 mg by mouth daily.    . Tamsulosin HCl (FLOMAX) 0.4 MG CAPS Take 1 capsule (0.4 mg total) by mouth every morning. 30 capsule 0   No current facility-administered medications for this visit.     Physical Exam: Vitals:   10/16/17 1548  BP: (!) 142/80  Pulse: 79  Weight: 252 lb (114.3 kg)  Height: 6' (1.829 m)    GEN- The patient is overweight appearing, alert and oriented x 3 today.   Head- normocephalic, atraumatic Eyes-  Sclera clear, conjunctiva pink Ears- hearing intact Oropharynx- clear Lungs-  Clear to ausculation bilaterally, normal work of breathing Chest- pacemaker pocket is well healed Heart- Regular rate and rhythm,2/6 SEM LUSB (mid peaking) GI- soft, NT, ND, + BS Extremities- no clubbing, cyanosis, or edema  Pacemaker interrogation- reviewed in detail today,  See PACEART report  ekg tracing ordered today is personally reviewed and shows sinus V paced  Assessment and Plan:  1. Symptomatic complete heart block Normal pacemaker function See Pace Art report No changes today  2. HTN Stable No change required today  3. Atrial tachycardia Low burden on device interrogation Conservative management  4. OSA Compliant with CPAP  5. Obesity Body mass index is  34.18 kg/m. Wt Readings from Last 3 Encounters:  10/16/17 252 lb (114.3 kg)  06/17/17 200 lb (90.7 kg)  06/10/17 220 lb (99.8 kg)   Lifestyle modification encouraged  Carelink compliance encouraged Return to see EP NP every year I will see when needed  [name] MD, [facility] 10/16/2017 4:23 PM

## 2017-10-16 NOTE — Patient Instructions (Addendum)
Medication Instructions:  Your physician recommends that you continue on your current medications as directed. Please refer to the Current Medication list given to you today.  Labwork: None ordered.  Testing/Procedures: None ordered.  Follow-Up: Your physician wants you to follow-up in: one year with Chanetta Marshall, NP.   You will receive a reminder letter in the mail two months in advance. If you don't receive a letter, please call our office to schedule the follow-up appointment.  Remote monitoring is used to monitor your Pacemaker from home. This monitoring reduces the number of office visits required to check your device to one time per year. It allows [place] to keep an eye on the functioning of your device to ensure it is working properly. You are scheduled for a device check from home on 01/15/2018. You may send your transmission at any time that day. If you have a wireless device, the transmission will be sent automatically. After your physician reviews your transmission, you will receive a postcard with your next transmission date.  Any Other Special Instructions Will Be Listed Below (If Applicable).  If you need a refill on your cardiac medications before your next appointment, please call your pharmacy.  Marland Kitchen

## 2017-12-27 ENCOUNTER — Ambulatory Visit (INDEPENDENT_AMBULATORY_CARE_PROVIDER_SITE_OTHER): Payer: Medicare Other | Admitting: Internal Medicine

## 2017-12-27 ENCOUNTER — Encounter: Payer: Self-pay | Admitting: Internal Medicine

## 2017-12-27 VITALS — BP 142/62 | HR 64 | Ht 72.0 in | Wt 253.0 lb

## 2017-12-27 DIAGNOSIS — Z950 Presence of cardiac pacemaker: Secondary | ICD-10-CM | POA: Diagnosis not present

## 2017-12-27 DIAGNOSIS — R0601 Orthopnea: Secondary | ICD-10-CM | POA: Diagnosis not present

## 2017-12-27 DIAGNOSIS — I442 Atrioventricular block, complete: Secondary | ICD-10-CM

## 2017-12-27 DIAGNOSIS — R0602 Shortness of breath: Secondary | ICD-10-CM

## 2017-12-27 DIAGNOSIS — I4719 Other supraventricular tachycardia: Secondary | ICD-10-CM

## 2017-12-27 DIAGNOSIS — I471 Supraventricular tachycardia: Secondary | ICD-10-CM

## 2017-12-27 LAB — CUP PACEART INCLINIC DEVICE CHECK
Battery Remaining Longevity: 112 mo
Battery Voltage: 2.78 V
Brady Statistic AP VS Percent: 0 %
Brady Statistic AS VP Percent: 85 %
Brady Statistic AS VS Percent: 2 %
Implantable Lead Implant Date: 20160413
Implantable Lead Location: 753859
Implantable Lead Model: 5076
Implantable Lead Model: 5076
Lead Channel Impedance Value: 425 Ohm
Lead Channel Impedance Value: 530 Ohm
Lead Channel Pacing Threshold Amplitude: 0.5 V
Lead Channel Pacing Threshold Amplitude: 0.75 V
Lead Channel Pacing Threshold Pulse Width: 0.4 ms
Lead Channel Pacing Threshold Pulse Width: 0.4 ms
Lead Channel Setting Pacing Amplitude: 2 V
Lead Channel Setting Pacing Amplitude: 2.5 V
Lead Channel Setting Pacing Pulse Width: 0.4 ms
Lead Channel Setting Sensing Sensitivity: 4 mV
MDC IDC LEAD IMPLANT DT: 20160413
MDC IDC LEAD LOCATION: 753860
MDC IDC MSMT BATTERY IMPEDANCE: 206 Ohm
MDC IDC MSMT LEADCHNL RA SENSING INTR AMPL: 2 mV
MDC IDC MSMT LEADCHNL RV SENSING INTR AMPL: 15.67 mV
MDC IDC PG IMPLANT DT: 20160413
MDC IDC SESS DTM: 20190830201503
MDC IDC STAT BRADY AP VP PERCENT: 13 %

## 2017-12-27 NOTE — Patient Instructions (Addendum)
Medication Instructions:  Your physician recommends that you continue on your current medications as directed. Please refer to the Current Medication list given to you today.  Labwork: None ordered.  Testing/Procedures: None ordered.  Follow-Up: Your physician recommends that you schedule a follow-up appointment in:   Please call and follow up with Dr Claiborne Billings or one of his Physician Assistants in the next couple of weeks.   Remote monitoring is used to monitor your Pacemaker from home. This monitoring reduces the number of office visits required to check your device to one time per year. It allows [place] to keep an eye on the functioning of your device to ensure it is working properly. You are scheduled for a device check from home on 01/15/18. You may send your transmission at any time that day. If you have a wireless device, the transmission will be sent automatically. After your physician reviews your transmission, you will receive a postcard with your next transmission date.    Any Other Special Instructions Will Be Listed Below (If Applicable).  Two Gram Sodium Diet 2000 mg  What is Sodium? Sodium is a mineral found naturally in many foods. The most significant source of sodium in the diet is table salt, which is about 40% sodium.  Processed, convenience, and preserved foods also contain a large amount of sodium.  The body needs only 500 mg of sodium daily to function,  A normal diet provides more than enough sodium even if you do not use salt.  Why Limit Sodium? A build up of sodium in the body can cause thirst, increased blood pressure, shortness of breath, and water retention.  Decreasing sodium in the diet can reduce edema and risk of heart attack or stroke associated with high blood pressure.  Keep in mind that there are many other factors involved in these health problems.  Heredity, obesity, lack of exercise, cigarette smoking, stress and what you eat all play a role.  General  Guidelines:  Do not add salt at the table or in cooking.  One teaspoon of salt contains over 2 grams of sodium.  Read food labels  Avoid processed and convenience foods  Ask your dietitian before eating any foods not dicussed in the menu planning guidelines  Consult your physician if you wish to use a salt substitute or a sodium containing medication such as antacids.  Limit milk and milk products to 16 oz (2 cups) per day.  Shopping Hints:  READ LABELS!! "Dietetic" does not necessarily mean low sodium.  Salt and other sodium ingredients are often added to foods during processing.   Menu Planning Guidelines Food Group Choose More Often Avoid  Beverages (see also the milk group All fruit juices, low-sodium, salt-free vegetables juices, low-sodium carbonated beverages Regular vegetable or tomato juices, commercially softened water used for drinking or cooking  Breads and Cereals Enriched white, wheat, rye and pumpernickel bread, hard rolls and dinner rolls; muffins, cornbread and waffles; most dry cereals, cooked cereal without added salt; unsalted crackers and breadsticks; low sodium or homemade bread crumbs Bread, rolls and crackers with salted tops; quick breads; instant hot cereals; pancakes; commercial bread stuffing; self-rising flower and biscuit mixes; regular bread crumbs or cracker crumbs  Desserts and Sweets Desserts and sweets mad with mild should be within allowance Instant pudding mixes and cake mixes  Fats Butter or margarine; vegetable oils; unsalted salad dressings, regular salad dressings limited to 1 Tbs; light, sour and heavy cream Regular salad dressings containing bacon fat, bacon bits,  and salt pork; snack dips made with instant soup mixes or processed cheese; salted nuts  Fruits Most fresh, frozen and canned fruits Fruits processed with salt or sodium-containing ingredient (some dried fruits are processed with sodium sulfites        Vegetables Fresh, frozen  vegetables and low- sodium canned vegetables Regular canned vegetables, sauerkraut, pickled vegetables, and others prepared in brine; frozen vegetables in sauces; vegetables seasoned with ham, bacon or salt pork  Condiments, Sauces, Miscellaneous  Salt substitute with physician's approval; pepper, herbs, spices; vinegar, lemon or lime juice; hot pepper sauce; garlic powder, onion powder, low sodium soy sauce (1 Tbs.); low sodium condiments (ketchup, chili sauce, mustard) in limited amounts (1 tsp.) fresh ground horseradish; unsalted tortilla chips, pretzels, potato chips, popcorn, salsa (1/4 cup) Any seasoning made with salt including garlic salt, celery salt, onion salt, and seasoned salt; sea salt, rock salt, kosher salt; meat tenderizers; monosodium glutamate; mustard, regular soy sauce, barbecue, sauce, chili sauce, teriyaki sauce, steak sauce, Worcestershire sauce, and most flavored vinegars; canned gravy and mixes; regular condiments; salted snack foods, olives, picles, relish, horseradish sauce, catsup   Food preparation: Try these seasonings Meats:    Pork Sage, onion Serve with applesauce  Chicken Poultry seasoning, thyme, parsley Serve with cranberry sauce  Lamb Curry powder, rosemary, garlic, thyme Serve with mint sauce or jelly  Veal Marjoram, basil Serve with current jelly, cranberry sauce  Beef Pepper, bay leaf Serve with dry mustard, unsalted chive butter  Fish Bay leaf, dill Serve with unsalted lemon butter, unsalted parsley butter  Vegetables:    Asparagus Lemon juice   Broccoli Lemon juice   Carrots Mustard dressing parsley, mint, nutmeg, glazed with unsalted butter and sugar   Green beans Marjoram, lemon juice, nutmeg,dill seed   Tomatoes Basil, marjoram, onion   Spice /blend for [employer]" 4 tsp ground thyme 1 tsp ground sage 3 tsp ground rosemary 4 tsp ground marjoram   Test your knowledge 1. A product that says "Salt Free" may still contain sodium. True or  False 2. Garlic Powder and Hot Pepper Sauce an be used as alternative seasonings.True or False 3. Processed foods have more sodium than fresh foods.  True or False 4. Canned Vegetables have less sodium than froze True or False  WAYS TO DECREASE YOUR SODIUM INTAKE 1. Avoid the use of added salt in cooking and at the table.  Table salt (and other prepared seasonings which contain salt) is probably one of the greatest sources of sodium in the diet.  Unsalted foods can gain flavor from the sweet, sour, and butter taste sensations of herbs and spices.  Instead of using salt for seasoning, try the following seasonings with the foods listed.  Remember: how you use them to enhance natural food flavors is limited only by your creativity... Allspice-Meat, fish, eggs, fruit, peas, red and yellow vegetables Almond Extract-Fruit baked goods Anise Seed-Sweet breads, fruit, carrots, beets, cottage cheese, cookies (tastes like licorice) Basil-Meat, fish, eggs, vegetables, rice, vegetables salads, soups, sauces Bay Leaf-Meat, fish, stews, poultry Burnet-Salad, vegetables (cucumber-like flavor) Caraway Seed-Bread, cookies, cottage cheese, meat, vegetables, cheese, rice Cardamon-Baked goods, fruit, soups Celery Powder or seed-Salads, salad dressings, sauces, meatloaf, soup, bread.Do not use  celery salt Chervil-Meats, salads, fish, eggs, vegetables, cottage cheese (parsley-like flavor) Chili Power-Meatloaf, chicken cheese, corn, eggplant, egg dishes Chives-Salads cottage cheese, egg dishes, soups, vegetables, sauces Cilantro-Salsa, casseroles Cinnamon-Baked goods, fruit, pork, lamb, chicken, carrots Cloves-Fruit, baked goods, fish, pot roast, green beans, beets, carrots Coriander-Pastry, cookies,  meat, salads, cheese (lemon-orange flavor) Cumin-Meatloaf, fish,cheese, eggs, cabbage,fruit pie (caraway flavor) [employer], fruit, eggs, fish, poultry, cottage cheese, vegetables Dill Seed-Meat, cottage  cheese, poultry, vegetables, fish, salads, bread Fennel Seed-Bread, cookies, apples, pork, eggs, fish, beets, cabbage, cheese, Licorice-like flavor Garlic-(buds or powder) Salads, meat, poultry, fish, bread, butter, vegetables, potatoes.Do not  use garlic salt Ginger-Fruit, vegetables, baked goods, meat, fish, poultry Horseradish Root-Meet, vegetables, butter Lemon Juice or Extract-Vegetables, fruit, tea, baked goods, fish salads Mace-Baked goods fruit, vegetables, fish, poultry (taste like nutmeg) Maple Extract-Syrups Marjoram-Meat, chicken, fish, vegetables, breads, green salads (taste like Sage) Mint-Tea, lamb, sherbet, vegetables, desserts, carrots, cabbage Mustard, Dry or Seed-Cheese, eggs, meats, vegetables, poultry Nutmeg-Baked goods, fruit, chicken, eggs, vegetables, desserts Onion Powder-Meat, fish, poultry, vegetables, cheese, eggs, bread, rice salads (Do not use   Onion salt) Orange Extract-Desserts, baked goods Oregano-Pasta, eggs, cheese, onions, pork, lamb, fish, chicken, vegetables, green salads Paprika-Meat, fish, poultry, eggs, cheese, vegetables Parsley Flakes-Butter, vegetables, meat fish, poultry, eggs, bread, salads (certain forms may   Contain sodium Pepper-Meat fish, poultry, vegetables, eggs Peppermint Extract-Desserts, baked goods Poppy Seed-Eggs, bread, cheese, fruit dressings, baked goods, noodles, vegetables, cottage  [employer], poultry, meat, fish, cauliflower, turnips,eggs bread Saffron-Rice, bread, veal, chicken, fish, eggs Sage-Meat, fish, poultry, onions, eggplant, tomateos, pork, stews Savory-Eggs, salads, poultry, meat, rice, vegetables, soups, pork Tarragon-Meat, poultry, fish, eggs, butter, vegetables (licorice-like flavor)  Thyme-Meat, poultry, fish, eggs, vegetables, (clover-like flavor), sauces, soups Tumeric-Salads, butter, eggs, fish, rice, vegetables (saffron-like flavor) Vanilla Extract-Baked goods,  candy Vinegar-Salads, vegetables, meat marinades Walnut Extract-baked goods, candy  2. Choose your Foods Wisely   The following is a list of foods to avoid which are high in sodium:  Meats-Avoid all smoked, canned, salt cured, dried and kosher meat and fish as well as Anchovies   [employer] meats:Bologna, Liverwurst, Pastrami Canned meat or fish  Marinated herring Caviar    Pepperoni Corned Beef   Pizza Dried chipped beef  Salami Frozen breaded fish or meat Salt pork Frankfurters or hot dogs  Sardines Gefilte fish   Sausage Ham (boiled ham, Proscuitto Smoked butt    spiced ham)   Spam      TV Dinners Vegetables Canned vegetables (Regular) Relish Canned mushrooms  Sauerkraut Olives    Tomato juice Pickles  Bakery and Dessert Products Canned puddings  Cream pies Cheesecake   Decorated cakes Cookies  Beverages/Juices Tomato juice, regular  Gatorade   V-8 vegetable juice, regular  Breads and Cereals Biscuit mixes   Salted potato chips, corn chips, pretzels Bread stuffing mixes  Salted crackers and rolls Pancake and waffle mixes Self-rising flour  Seasonings Accent    Meat sauces Barbecue sauce  Meat tenderizer Catsup    Monosodium glutamate (MSG) Celery salt   Onion salt Chili sauce   Prepared mustard Garlic salt   Salt, seasoned salt, sea salt Gravy mixes   Soy sauce Horseradish   Steak sauce Ketchup   Tartar sauce Lite salt    Teriyaki sauce Marinade mixes   Worcestershire sauce  Others Baking powder   Cocoa and cocoa mixes Baking soda   Commercial casserole mixes Candy-caramels, chocolate  Dehydrated soups    Bars, fudge,nougats  Instant rice and pasta mixes Canned broth or soup  Maraschino cherries Cheese, aged and processed cheese and cheese spreads  Learning Assessment Quiz  Indicated T (for True) or F (for False) for each of the following statements:  1. _____ Fresh fruits and vegetables and unprocessed grains  are generally low in  sodium 2. _____ Water may contain a considerable amount of sodium, depending on the source 3. _____ You can always tell if a food is high in sodium by tasting it 4. _____ Certain laxatives my be high in sodium and should be avoided unless prescribed   by a physician or pharmacist 5. _____ Salt substitutes may be used freely by anyone on a sodium restricted diet 6. _____ Sodium is present in table salt, food additives and as a natural component of   most foods 7. _____ Table salt is approximately 90% sodium 8. _____ Limiting sodium intake may help prevent excess fluid accumulation in the body 9. _____ On a sodium-restricted diet, seasonings such as bouillon soy sauce, and    cooking wine should be used in place of table salt 10. _____ On an ingredient list, a product which lists monosodium glutamate as the first   ingredient is an appropriate food to include on a low sodium diet  Circle the best answer(s) to the following statements (Hint: there may be more than one correct answer)  11. On a low-sodium diet, some acceptable snack items are:    A. Olives  F. Bean dip   K. Grapefruit juice    B. Salted Pretzels G. Commercial Popcorn   L. Canned peaches    C. Carrot Sticks  H. Bouillon   M. Unsalted nuts   D. [place] fries  I. Peanut butter crackers N. Salami   E. Sweet pickles J. Tomato Juice   O. Pizza  12.  Seasonings that may be used freely on a reduced - sodium diet include   A. Lemon wedges F.Monosodium glutamate K. Celery seed    B.Soysauce   G. Pepper   L. Mustard powder   C. Sea salt  H. Cooking wine  M. Onion flakes   D. Vinegar  E. Prepared horseradish N. Salsa   E. Sage   J. Worcestershire sauce  O. Chutney    If you need a refill on your cardiac medications before your next appointment, please call your pharmacy.

## 2017-12-27 NOTE — Progress Notes (Signed)
PCP: Lajean Manes, MD Primary Cardiologist: Dr Claiborne Billings Primary EP:  Dr Venia Minks is a 67 y.o. male who presents today for add on Doc of the day followup.  He reports orthopnea for the past few days.  He saw his PCP who recommended cardiology follow-up.  He has occasional SOB with moderate activity.  Denies edema or chest pain.  He has a high sodium diet.  + occasional postural dizziness.   Today, he denies symptoms of palpitations,  presyncope, or syncope.  The patient is otherwise without complaint today.   Past Medical History:  Diagnosis Date  . Cancer ([place])    thyroid  . Complete heart block (HCC)    a. s/p MDT dual chamber PPM followed by Dr [name]   . Complication of anesthesia    1985 after Thyriodectomy hard time waking up  . Diabetes mellitus   . GERD (gastroesophageal reflux disease)   . Hypertension   . Hypothyroidism    Had two surgeries for Cancer   Past Surgical History:  Procedure Laterality Date  . LUMBAR LAMINECTOMY/DECOMPRESSION MICRODISCECTOMY  07/19/2011   Procedure: LUMBAR LAMINECTOMY/DECOMPRESSION MICRODISCECTOMY 3 LEVELS;  Surgeon: Melina Schools, MD;  Location: [facility];  Service: Orthopedics;  Laterality: Left;  Lumbar three-Lumbar five LEFT DECOMPRESSION AND FORAMINOTOMY Lumbar three-four LEFT DISCECTOMY  . PERMANENT PACEMAKER INSERTION N/A 08/11/2014   MDT Adapta L implanted by Dr [name] for CHB  . Thyroidectomy x2      ROS- all systems are reviewed and negative except as per HPI above  Current Outpatient Medications  Medication Sig Dispense Refill  . acetaminophen (TYLENOL) 500 MG tablet Take 500 mg by mouth every 6 (six) hours as needed (pain).     Marland Kitchen amLODipine (NORVASC) 10 MG tablet Take 5 mg by mouth daily.     Marland Kitchen aspirin EC 81 MG tablet Take 81 mg by mouth daily.    Marland Kitchen atorvastatin (LIPITOR) 10 MG tablet Take 10 mg by mouth daily.    . cyclobenzaprine (FLEXERIL) 10 MG tablet Take 10 mg by mouth 3 (three) times daily as needed. For  muscle pain.    Marland Kitchen glipiZIDE (GLUCOTROL) 10 MG tablet     . hydrochlorothiazide (HYDRODIURIL) 25 MG tablet Take 25 mg by mouth daily.    Marland Kitchen ibuprofen (ADVIL,MOTRIN) 800 MG tablet Take 800 mg by mouth every 8 (eight) hours as needed. For pain.    . INVOKANA 100 MG TABS tablet     . levothyroxine (SYNTHROID, LEVOTHROID) 175 MCG tablet Take 175 mcg by mouth daily before breakfast.    . metaxalone (SKELAXIN) 800 MG tablet Take 800 mg by mouth 3 (three) times daily as needed for muscle spasms.    . mupirocin ointment (BACTROBAN) 2 % Apply 1 application topically daily.    Marland Kitchen omeprazole (PRILOSEC) 20 MG capsule Take 20 mg by mouth daily as needed. For acid reflux    . quinapril (ACCUPRIL) 20 MG tablet Take 1 tablet (20 mg total) by mouth at bedtime. 30 tablet 6  . sertraline (ZOLOFT) 100 MG tablet Take 100 mg by mouth daily.    . Tamsulosin HCl (FLOMAX) 0.4 MG CAPS Take 1 capsule (0.4 mg total) by mouth every morning. 30 capsule 0   No current facility-administered medications for this visit.     Physical Exam: Vitals:   12/27/17 1636  BP: (!) 142/62  Pulse: 64  Weight: 253 lb (114.8 kg)  Height: 6' (1.829 m)    GEN- The patient  is well appearing, alert and oriented x 3 today.   Head- normocephalic, atraumatic Eyes-  Sclera clear, conjunctiva pink Ears- hearing intact Oropharynx- clear Lungs- Clear to ausculation bilaterally, normal work of breathing Chest- pacemaker pocket is well healed Heart- Regular rate and rhythm, 2/6 SEM LUSB which is late peaking GI- soft, NT, ND, + BS Extremities- no clubbing, cyanosis, or edema  Pacemaker interrogation- reviewed in detail today,  See PACEART report   Assessment and Plan:  1. Symptomatic complete heart block Normal pacemaker function See Pace Art report No changes today  2. HTN Stable No change required today  3. atach Low burden Conservative management  4. Obesity Body mass index is 34.31 kg/m. Lifestyle modification  encouraged again today  5. Orthopnea/ SOB Echo 4/19 reviewed.  EF preserved.  AS felt to be moderate, though perhaps severe by exam. 2 gram sodium restriction advised He is overdue to follow-up with Dr Claiborne Billings.  We will arrange follow-up with Dr Claiborne Billings and his team in the next week.  [facility] Return to see EP NP every year Follow-up with Dr Claiborne Billings as above  [name] MD, [facility] 12/27/2017 4:57 PM

## 2018-01-13 NOTE — Progress Notes (Signed)
Cardiology Office Note:    Date:  01/15/2018   ID:  Tony [name], DOB 1951-02-03, MRN [id number]  PCP:  Lajean Manes, MD  Cardiologist:  [name], MD   Referring MD: Lajean Manes, MD   Chief Complaint  Patient presents with  . Follow-up  . Dizziness    History of Present Illness:    Tony [name] is a 67 y.o. male with a hx of atrial tachycardia and complete heart block status post pacemaker in place 07/2014 followed by Dr. [name], OSA on CPAP, hypertension, obesity, DM, GERD, and HTN. He was recently seen as a work-in appt with Dr. [name] on 12/27/17 for orthopnea. Echo on 07/2017 was reviewed and he was scheduled for a follow up appt with general cardiology.    He presents today for this follow up appt. Echo 07/2017 with LVEF of 55% and grade 1 DD, moderate AS. He has a multitude of complaints. He presents to clinic in a neck brace - no injuries, but has a "crick in my neck." He states that he works at [employer] three days per week. He experiences muscle fatigue when he climbs the stairs now, which is new. He is compliant on his CPAP, but now experiences SOB when he lays down at night and puts on his CPAP. He wakes up several times each night to go to the bathroom or take his wife to the bathroom (who had a stroke). He does not experience this SOB when coming back to bed, but reports being dizzy when up in the middle of the night. He reports a lower diastolic blood pressure - in the 60s now, baseline previously in the 80s. He is also concerned about his thyroid, followed by Dr. [name].  Dr. Elenore Rota follows his CPAP.  Irregular heart rhythm on exam.  EKG with significant ectopy.   Past Medical History:  Diagnosis Date  . Cancer ([place])    thyroid  . Complete heart block (HCC)    a. s/p MDT dual chamber PPM followed by Dr [name]   . Complication of anesthesia    1985 after Thyriodectomy hard time waking up  . Diabetes mellitus   . GERD (gastroesophageal reflux disease)   .  Hypertension   . Hypothyroidism    Had two surgeries for Cancer    Past Surgical History:  Procedure Laterality Date  . LUMBAR LAMINECTOMY/DECOMPRESSION MICRODISCECTOMY  07/19/2011   Procedure: LUMBAR LAMINECTOMY/DECOMPRESSION MICRODISCECTOMY 3 LEVELS;  Surgeon: Melina Schools, MD;  Location: [facility];  Service: Orthopedics;  Laterality: Left;  Lumbar three-Lumbar five LEFT DECOMPRESSION AND FORAMINOTOMY Lumbar three-four LEFT DISCECTOMY  . PERMANENT PACEMAKER INSERTION N/A 08/11/2014   MDT Adapta L implanted by Dr [name] for CHB  . Thyroidectomy x2      Current Medications: Current Meds  Medication Sig  . acetaminophen (TYLENOL) 500 MG tablet Take 500 mg by mouth every 6 (six) hours as needed (pain).   Marland Kitchen amLODipine (NORVASC) 10 MG tablet Take 10 mg by mouth daily.   Marland Kitchen aspirin EC 81 MG tablet Take 81 mg by mouth daily.  Marland Kitchen atorvastatin (LIPITOR) 10 MG tablet Take 10 mg by mouth daily.  . cyclobenzaprine (FLEXERIL) 10 MG tablet Take 10 mg by mouth 3 (three) times daily as needed. For muscle pain.  Marland Kitchen glipiZIDE (GLUCOTROL) 10 MG tablet Take 20 mg by mouth 2 (two) times daily before a meal.   . hydrochlorothiazide (HYDRODIURIL) 25 MG tablet Take 25 mg by mouth daily.  Marland Kitchen ibuprofen (ADVIL,MOTRIN) 800  MG tablet Take 800 mg by mouth every 8 (eight) hours as needed. For pain.  . INVOKANA 100 MG TABS tablet Take 100 mg by mouth daily before breakfast.   . levothyroxine (SYNTHROID, LEVOTHROID) 175 MCG tablet Take 175 mcg by mouth daily before breakfast. Alt with 1 and 1/2 tablets daily  . metaxalone (SKELAXIN) 800 MG tablet Take 800 mg by mouth 3 (three) times daily as needed for muscle spasms.  . metFORMIN (GLUCOPHAGE) 500 MG tablet Take 1,000 mg by mouth 2 (two) times daily with a meal.  . mupirocin ointment (BACTROBAN) 2 % Apply 1 application topically daily.  Marland Kitchen omeprazole (PRILOSEC) 20 MG capsule Take 20 mg by mouth daily as needed. For acid reflux  . quinapril (ACCUPRIL) 20 MG tablet Take 1 tablet  (20 mg total) by mouth at bedtime.  . sertraline (ZOLOFT) 100 MG tablet Take 100 mg by mouth daily.  . Tamsulosin HCl (FLOMAX) 0.4 MG CAPS Take 1 capsule (0.4 mg total) by mouth every morning.     Allergies:   Corticosteroids; Crestor [rosuvastatin calcium]; and Simvastatin   Social History   Socioeconomic History  . Marital status: Married    Spouse name: jody  . Number of children: 2  . Years of education: college  . Highest education level: Not on file  Occupational History  . Occupation: retired  Scientific laboratory technician  . Financial resource strain: Not on file  . Food insecurity:    Worry: Not on file    Inability: Not on file  . Transportation needs:    Medical: Not on file    Non-medical: Not on file  Tobacco Use  . Smoking status: Never Smoker  . Smokeless tobacco: Never Used  Substance and Sexual Activity  . Alcohol use: No    Alcohol/week: 0.0 standard drinks  . Drug use: No  . Sexual activity: Not on file  Lifestyle  . Physical activity:    Days per week: Not on file    Minutes per session: Not on file  . Stress: Not on file  Relationships  . Social connections:    Talks on phone: Not on file    Gets together: Not on file    Attends religious service: Not on file    Active member of club or organization: Not on file    Attends meetings of clubs or organizations: Not on file    Relationship status: Not on file  Other Topics Concern  . Not on file  Social History Narrative  . Not on file     Family History: The patient's family history includes Heart attack in his father; Heart disease in his father; Lung cancer in his mother. There is no history of Anesthesia problems, Hypotension, or Pseudochol deficiency.  ROS:   Please see the history of present illness.    All other systems reviewed and are negative.  EKGs/Labs/Other Studies Reviewed:    The following studies were reviewed today:  Echo 08/15/17 Study Conclusions - Left ventricle: The cavity size was  normal. Wall thickness was   increased in a pattern of moderate LVH. Septal bounce consistent   with RV pacing. The estimated ejection fraction was 55%. Although   no diagnostic regional wall motion abnormality was identified,   this possibility cannot be completely excluded on the basis of   this study. Doppler parameters are consistent with abnormal left   ventricular relaxation (grade 1 diastolic dysfunction). - Aortic valve: Trileaflet; severely calcified leaflets. There was   moderate  stenosis. Mean gradient (S): 20 mm Hg. Peak gradient   (S): 52 mm Hg. Valve area (VTI): 1.44 cm^2. - Left atrium: The atrium was mildly dilated. - Right ventricle: The cavity size was normal. Pacer wire or   catheter noted in right ventricle. Systolic function was normal. - Tricuspid valve: Peak RV-RA gradient (S): 20 mm Hg. - Pulmonary arteries: PA peak pressure: 23 mm Hg (S). - Inferior vena cava: The vessel was normal in size. The   respirophasic diameter changes were in the normal range (>= 50%),   consistent with normal central venous pressure.  Impressions: - Normal LV size with moderate LV hypertrophy. EF 55%. Septal   bounce consistent with RV pacing. Normal RV size and systolic   function. Moderate aortic stenosis.   PPM check 12/27/17: Pacemaker check in clinic. Normal device function. Thresholds, sensing, impedances consistent with previous measurements. Device programmed to maximize longevity. 12 mode switches (<0.1%)--AT, longest 27min 15sec. 1 high ventricular rate noted--NSVT,  duration ~20bts. Device programmed at appropriate safety margins. Histogram distribution appropriate for patient activity level. Device programmed to optimize intrinsic conduction. Estimated longevity 9.5 years. Patient enrolled in remote follow-up.    EKG:  EKG is ordered today.  The ekg ordered today demonstrates sinus with frequent ectopy  Recent Labs: No results found for requested labs within last 8760  hours.  Recent Lipid Panel No results found for: CHOL, TRIG, HDL, CHOLHDL, VLDL, LDLCALC, LDLDIRECT  Physical Exam:    VS:  BP (!) 142/64   Pulse 60   Resp 10   Ht 6' (1.829 m)   Wt 245 lb (111.1 kg)   BMI 33.23 kg/m     Wt Readings from Last 3 Encounters:  01/15/18 245 lb (111.1 kg)  12/27/17 253 lb (114.8 kg)  10/16/17 252 lb (114.3 kg)     GEN: Well nourished, well developed in no acute distress, significant anxiety, presents in a neck brace HEENT: Normal NECK: No JVD; No carotid bruits CARDIAC: irregular rhythm, regular rate, + murmur RESPIRATORY:  Clear to auscultation without rales, wheezing or rhonchi  ABDOMEN: Soft, non-tender, non-distended MUSCULOSKELETAL:  No edema; No deformity  SKIN: Warm and dry NEUROLOGIC:  Alert and oriented x 3 PSYCHIATRIC:  Normal affect   ASSESSMENT:    1. Dyspnea on exertion   2. CHB (complete heart block) (HCC)   3. Aortic valve stenosis, etiology of cardiac valve disease unspecified   4. OSA on CPAP   5. Essential hypertension   6. Type 2 diabetes mellitus with peripheral neuropathy (HCC)    PLAN:    In order of problems listed above:  Dyspnea on exertion CHB (complete heart block) (HCC) Aortic valve stenosis, etiology of cardiac valve disease unspecified OSA on CPAP Essential hypertension Type 2 diabetes mellitus with peripheral neuropathy [facility])  Patient presents with a multitude of problems.  He reports some muscle fatigue, dyspnea on exertion, dyspnea when starting to use his CPAP at night, dizziness, and being unsteady on his feet.  He has not had a recent ischemic evaluation.  Given his aortic stenosis, will obtain CT coronary to evaluate CAD.  Given his dyspnea on exertion and possible orthopnea, will obtain echocardiogram to evaluate structure and function.  I detected irregular heartbeat on exam prompting an EKG.  He is having significant ectopy.  We will start a low-dose beta-blocker: 12.5 mg Lopressor twice daily.  We  do not follow his CPAP.  I have asked him to check in with Dr. Elenore Rota for a  CPAP download to see if he needs his settings readjusted. BMP today for CT coronary.  Heart rate today on intake was 60 bpm.  I would like to see him back in approximately 6 months, sooner if he has an abnormal stress test.  In consultation with Chanetta Marshall, EP APP, we will see him back with Dr. [name] in 6 months.  Plan: ECHOCARDIOGRAM COMPLETE, CT CORONARY MORPH W/CTA COR W/SCORE W/CA W/CM &/OR WO/CM, CT CORONARY FRACTIONAL FLOW RESERVE DATA PREP, CT CORONARY FRACTIONAL FLOW RESERVE FLUID ANALYSIS, Basic metabolic panel  Medication Adjustments/Labs and Tests Ordered: Current medicines are reviewed at length with the patient today.  Concerns regarding medicines are outlined above.   Orders Placed This Encounter  Procedures  . CT CORONARY MORPH W/CTA COR W/SCORE W/CA W/CM &/OR WO/CM  . CT CORONARY FRACTIONAL FLOW RESERVE DATA PREP  . CT CORONARY FRACTIONAL FLOW RESERVE FLUID ANALYSIS  . Basic metabolic panel  . ECHOCARDIOGRAM COMPLETE   Meds ordered this encounter  Medications  . metoprolol tartrate (LOPRESSOR) 25 MG tablet    Sig: Take 0.5 tablets (12.5 mg total) by mouth 2 (two) times daily.    Dispense:  180 tablet    Refill:  1    Signed, Ledora Bottcher, [place]  01/15/2018 3:43 PM    [facility]

## 2018-01-15 ENCOUNTER — Encounter: Payer: Self-pay | Admitting: Physician Assistant

## 2018-01-15 ENCOUNTER — Encounter: Payer: Medicare Other | Admitting: *Deleted

## 2018-01-15 ENCOUNTER — Telehealth: Payer: Self-pay | Admitting: Cardiology

## 2018-01-15 ENCOUNTER — Ambulatory Visit (INDEPENDENT_AMBULATORY_CARE_PROVIDER_SITE_OTHER): Payer: Medicare Other | Admitting: Physician Assistant

## 2018-01-15 VITALS — BP 142/64 | HR 60 | Resp 10 | Ht 72.0 in | Wt 245.0 lb

## 2018-01-15 DIAGNOSIS — I442 Atrioventricular block, complete: Secondary | ICD-10-CM

## 2018-01-15 DIAGNOSIS — Z9989 Dependence on other enabling machines and devices: Secondary | ICD-10-CM

## 2018-01-15 DIAGNOSIS — I350 Nonrheumatic aortic (valve) stenosis: Secondary | ICD-10-CM

## 2018-01-15 DIAGNOSIS — R0609 Other forms of dyspnea: Secondary | ICD-10-CM

## 2018-01-15 DIAGNOSIS — G4733 Obstructive sleep apnea (adult) (pediatric): Secondary | ICD-10-CM

## 2018-01-15 DIAGNOSIS — I10 Essential (primary) hypertension: Secondary | ICD-10-CM

## 2018-01-15 DIAGNOSIS — E1142 Type 2 diabetes mellitus with diabetic polyneuropathy: Secondary | ICD-10-CM

## 2018-01-15 MED ORDER — METOPROLOL TARTRATE 25 MG PO TABS: 12.5000 mg | ORAL_TABLET | Freq: Two times a day (BID) | ORAL | 1 refills | Status: DC

## 2018-01-15 NOTE — Patient Instructions (Addendum)
Medication Instructions:  Start Lopressor 12.5(half tablet) twice daily.  If you need a refill on your cardiac medications before your next appointment, please call your pharmacy.  Labwork: BMET HERE IN OUR OFFICE AT [facility]  Take the provided lab slips with you to the lab for your blood draw.    Testing/Procedures: Your physician has requested that you have cardiac CT. Cardiac computed tomography (CT) is a painless test that uses an x-ray machine to take clear, detailed pictures of your heart. For further information please visit [facility]. Please follow instruction sheet as given.  Your physician has requested that you have an echocardiogram. Echocardiography is a painless test that uses sound waves to create images of your heart. It provides your doctor with information about the size and shape of your heart and how well your heart's chambers and valves are working. This procedure takes approximately one hour. There are no restrictions for this procedure.  Follow-Up: Your physician wants you to follow-up 1 Month with Dr.Kelly ONLY and in 6 months with Dr.Alred.   Thank you for choosing [facility] at [facility]!!         Please arrive at the [facility] main entrance of [facility] at xx:xx AM (30-45 minutes prior to test start time)  [facility] [place], [place] [address] [phone]  Proceed to the [facility] Radiology Department (First Floor).  Please follow these instructions carefully (unless otherwise directed):  Hold all erectile dysfunction medications at least 48 hours prior to test.  On the Night Before the Test: . Drink plenty of water. . Do not consume any caffeinated/decaffeinated beverages or chocolate 12 hours prior to your test. . Do not take any antihistamines 12 hours prior to your test. . If you take Metformin do not take 24 hours prior to test.  On the Day of the Test: . Drink plenty of water. Do  not drink any water within one hour of the test. . Do not eat any food 4 hours prior to the test. . You may take your regular medications prior to the test. . HOLD Furosemide morning of the test.  After the Test: . Drink plenty of water. . After receiving IV contrast, you may experience a mild flushed feeling. This is normal. . On occasion, you may experience a mild rash up to 24 hours after the test. This is not dangerous. If this occurs, you can take Benadryl 25 mg and increase your fluid intake. . If you experience trouble breathing, this can be serious. If it is severe call 911 IMMEDIATELY. If it is mild, please call our office. . If you take any of these medications: Glipizide/Metformin, Avandament, Glucavance, please do not take 48 hours after completing test.

## 2018-01-15 NOTE — Telephone Encounter (Signed)
LMOVM reminding pt to send remote transmission.   

## 2018-01-16 ENCOUNTER — Encounter: Payer: Self-pay | Admitting: Cardiology

## 2018-01-16 LAB — BASIC METABOLIC PANEL
BUN/Creatinine Ratio: 23 (ref 10–24)
BUN: 33 mg/dL — ABNORMAL HIGH (ref 8–27)
CHLORIDE: 100 mmol/L (ref 96–106)
CO2: 25 mmol/L (ref 20–29)
Calcium: 9.6 mg/dL (ref 8.6–10.2)
Creatinine, Ser: 1.45 mg/dL — ABNORMAL HIGH (ref 0.76–1.27)
GFR calc non Af Amer: 49 mL/min/{1.73_m2} — ABNORMAL LOW (ref >59)
GFR, EST AFRICAN AMERICAN: 57 mL/min/{1.73_m2} — AB (ref >59)
Glucose: 164 mg/dL — ABNORMAL HIGH (ref 65–99)
POTASSIUM: 4.1 mmol/L (ref 3.5–5.2)
SODIUM: 140 mmol/L (ref 134–144)

## 2018-01-17 NOTE — Addendum Note (Signed)
Addended by: Aleatha Borer on: 01/17/2018 08:52 AM   Modules accepted: Orders

## 2018-01-21 ENCOUNTER — Other Ambulatory Visit (HOSPITAL_COMMUNITY): Payer: Medicare Other

## 2018-01-28 ENCOUNTER — Ambulatory Visit (HOSPITAL_COMMUNITY): Payer: Medicare Other | Attending: Cardiology

## 2018-01-28 ENCOUNTER — Other Ambulatory Visit: Payer: Self-pay

## 2018-01-28 DIAGNOSIS — I119 Hypertensive heart disease without heart failure: Secondary | ICD-10-CM | POA: Insufficient documentation

## 2018-01-28 DIAGNOSIS — I080 Rheumatic disorders of both mitral and aortic valves: Secondary | ICD-10-CM | POA: Insufficient documentation

## 2018-01-28 DIAGNOSIS — I442 Atrioventricular block, complete: Secondary | ICD-10-CM | POA: Insufficient documentation

## 2018-01-28 DIAGNOSIS — R0609 Other forms of dyspnea: Secondary | ICD-10-CM | POA: Insufficient documentation

## 2018-01-28 DIAGNOSIS — E119 Type 2 diabetes mellitus without complications: Secondary | ICD-10-CM | POA: Insufficient documentation

## 2018-01-28 DIAGNOSIS — I77810 Thoracic aortic ectasia: Secondary | ICD-10-CM | POA: Diagnosis not present

## 2018-01-30 ENCOUNTER — Telehealth: Payer: Self-pay | Admitting: Physician Assistant

## 2018-01-30 NOTE — Telephone Encounter (Signed)
Spoke with Pt who states he was calling to get results of recent ECHO. Informed pt that once report is reviewed by Doreene Adas, PA her nurse will call with results. Pt states he doesn't want to wait until Mon to get report and would like preliminary report released to mychart. Will route to PA and Nurse.

## 2018-01-30 NOTE — Telephone Encounter (Signed)
New Message ° ° °Patient is calling to obtain his echocardiogram results. Please call to discuss.  °

## 2018-02-03 ENCOUNTER — Ambulatory Visit (INDEPENDENT_AMBULATORY_CARE_PROVIDER_SITE_OTHER): Payer: Medicare Other | Admitting: Cardiovascular Disease

## 2018-02-03 ENCOUNTER — Encounter: Payer: Self-pay | Admitting: Cardiovascular Disease

## 2018-02-03 VITALS — BP 141/82 | HR 83 | Wt 242.4 lb

## 2018-02-03 DIAGNOSIS — R0609 Other forms of dyspnea: Secondary | ICD-10-CM | POA: Diagnosis not present

## 2018-02-03 DIAGNOSIS — I10 Essential (primary) hypertension: Secondary | ICD-10-CM

## 2018-02-03 DIAGNOSIS — Z950 Presence of cardiac pacemaker: Secondary | ICD-10-CM

## 2018-02-03 DIAGNOSIS — E1142 Type 2 diabetes mellitus with diabetic polyneuropathy: Secondary | ICD-10-CM

## 2018-02-03 DIAGNOSIS — E785 Hyperlipidemia, unspecified: Secondary | ICD-10-CM

## 2018-02-03 DIAGNOSIS — I350 Nonrheumatic aortic (valve) stenosis: Secondary | ICD-10-CM | POA: Diagnosis not present

## 2018-02-03 DIAGNOSIS — G4733 Obstructive sleep apnea (adult) (pediatric): Secondary | ICD-10-CM

## 2018-02-03 DIAGNOSIS — Z9989 Dependence on other enabling machines and devices: Secondary | ICD-10-CM

## 2018-02-03 DIAGNOSIS — E039 Hypothyroidism, unspecified: Secondary | ICD-10-CM

## 2018-02-03 MED ORDER — HYDROCHLOROTHIAZIDE 25 MG PO TABS: 12.5000 mg | ORAL_TABLET | Freq: Every day | ORAL | 3 refills | Status: DC

## 2018-02-03 NOTE — Patient Instructions (Signed)
Medication Instructions:  DECREASE HCTZ 12.5 mg daily  If you need a refill on your cardiac medications before your next appointment, please call your pharmacy.   Follow-Up: At [facility], you and your health needs are our priority.  As part of our continuing mission to provide you with exceptional heart care, we have created designated Provider Care Teams.  These Care Teams include your primary Cardiologist (physician) and Advanced Practice Providers (APPs -  Physician Assistants and Nurse Practitioners) who all work together to provide you with the care you need, when you need it. You will need a follow up appointment in 3 months.  Please call our office 2 months in advance to schedule this appointment.  You may see [name], MD or one of the following Advanced Practice Providers on your designated Care Team: [place], [place] . Fabian Sharp, PA-C  Any Other Special Instructions Will Be Listed Below (If Applicable). Please contact [facility] to obtain a CPAP download

## 2018-02-03 NOTE — Progress Notes (Signed)
Patient ID: Tony [name], male   DOB: 1951-03-02, 67 y.o.   MRN: [id number]     HPI: Tony [name] is a 67 y.o. male who presents to the office today for a 3-1/2-year follow up cardiology evaluation.   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 He has a long-standing history of hypertension, diabetes mellitus type 2, hyperlipidemia, hypothyroidism, peripheral neuropathy, obstructive sleep apnea on CPAP therapy.  He has a history of PVCs and had been on low dose beta blocker therapy with suppression.  Since he has retired, he has continued to participate in the cardiac wellness program.  An echo Doppler study in 2013 showed an ejection fraction of 55-60%.  He has chronic right bundle branch block.  In April 2016 he was found to have bradycardia on heart monitor at rehabilitation.  An ECG demonstrated complete heart block.  He complained of dizziness but did not have syncope.  He had been off all AV nodal blocking agents for a week.  He was referred to Dr. [name] and underwent implantation of a MDT dual-chamber permanent pacemaker on 08/11/2014.  An echo Doppler study prior to implantation revealed normal LV function with evidence for calcified aortic valve with mild-to-moderate stenosis.  There was mild left atrial enlargement.  I had seen him in 2014 and last saw him in May 2016 following his pacemaker implantation.  Over the last 3+ years, he has been fairly stable.  He is followed primarily by Dr. [name] and also sees Dr. Robina Ade.  He has been on CPAP therapy but has not had any recent evaluations.  Recently, he has noticed that he has become more lethargic and fatigue particularly with walking up steps.  He denies associated chest pain.  He admits that he sweats profusely.  His symptoms did improve with hydration.  He has a history of diabetes mellitus for 10 years.  He has a history of hypertension.  He has been on  amlodipine 10 mg, hydrochlorothiazide 25 mg daily, metoprolol 12.5 mg twice a day and quinapril 20 mg.  With reference to his diabetes he has been on Invokana 100 mg, metformin 500 mg, glipizide 20 mg 2 times a day before meal and 10 units of insulin.  He is on levothyroxine 175 mcg and alternates 1 and 1/2 tablets every other day. He is followed by Dr. Buddy Duty.  A review of his records indicates that he underwent an echo Doppler study in April 2019 which showed moderate LVH with estimated EF of 55% without definitive wall motion abnormalities.  His mean aortic gradient was 20 mm with a peak gradient of 52 mm.  He was felt to have moderate aortic stenosis.  He was seen by Dr. [name] in August 2019 and had normal pacemaker function.  He was seen by Doreene Adas, PA on January 15, 2018 and admitted to episodes of lower diastolic blood pressure.  He underwent a follow-up echo Doppler study which showed an EF of 50 to 55% on January 28, 2018.  There was moderate LVH.  His mean aortic gradient was 24 with a peak gradient of 45 and it was again felt that his stenosis was moderate.   He denies any episodes of chest pain or recent palpitations.  He denies any presyncope or syncope.  He does experience some exertional shortness of breath but denies any PND orthopnea.  He presents to reestablish cardiology care with me.   Past  Medical History:  Diagnosis Date  . Cancer ([place])    thyroid  . Complete heart block (H[facility])    a. s/p MDT dual chamber PPM followed by Dr [name]   . Complication of anesthesia    1985 after Thyriodectomy hard time waking up  . Diabetes mellitus   . GERD (gastroesophageal reflux disease)   . Hypertension   . Hypothyroidism    Had two surgeries for Cancer    Past Surgical History:  Procedure Laterality Date  . LUMBAR LAMINECTOMY/DECOMPRESSION MICRODISCECTOMY  07/19/2011   Procedure: LUMBAR LAMINECTOMY/DECOMPRESSION MICRODISCECTOMY 3 LEVELS;  Surgeon: Melina Schools, MD;  Location: [facility];   Service: Orthopedics;  Laterality: Left;  Lumbar three-Lumbar five LEFT DECOMPRESSION AND FORAMINOTOMY Lumbar three-four LEFT DISCECTOMY  . PERMANENT PACEMAKER INSERTION N/A 08/11/2014   MDT Adapta L implanted by Dr [name] for CHB  . Thyroidectomy x2      Allergies  Allergen Reactions  . Corticosteroids   . Crestor [Rosuvastatin Calcium] Other (See Comments)    Leg pain  . Simvastatin Other (See Comments)    Leg pain    Current Outpatient Medications  Medication Sig Dispense Refill  . acetaminophen (TYLENOL) 500 MG tablet Take 500 mg by mouth every 6 (six) hours as needed (pain).     Marland Kitchen amLODipine (NORVASC) 10 MG tablet Take 10 mg by mouth daily.     Marland Kitchen aspirin EC 81 MG tablet Take 81 mg by mouth daily.    Marland Kitchen atorvastatin (LIPITOR) 10 MG tablet Take 10 mg by mouth daily.    . cyclobenzaprine (FLEXERIL) 10 MG tablet Take 10 mg by mouth 3 (three) times daily as needed. For muscle pain.    Marland Kitchen glipiZIDE (GLUCOTROL) 10 MG tablet Take 20 mg by mouth 2 (two) times daily before a meal.     . hydrochlorothiazide (HYDRODIURIL) 25 MG tablet Take 0.5 tablets (12.5 mg total) by mouth daily. 45 tablet 3  . ibuprofen (ADVIL,MOTRIN) 800 MG tablet Take 800 mg by mouth every 8 (eight) hours as needed. For pain.    . INVOKANA 100 MG TABS tablet Take 100 mg by mouth daily before breakfast.     . levothyroxine (SYNTHROID, LEVOTHROID) 175 MCG tablet Take 175 mcg by mouth daily before breakfast. Alt with 1 and 1/2 tablets daily    . metaxalone (SKELAXIN) 800 MG tablet Take 800 mg by mouth 3 (three) times daily as needed for muscle spasms.    . metFORMIN (GLUCOPHAGE) 500 MG tablet Take 1,000 mg by mouth 2 (two) times daily with a meal.    . metoprolol tartrate (LOPRESSOR) 25 MG tablet Take 0.5 tablets (12.5 mg total) by mouth 2 (two) times daily. 180 tablet 1  . mupirocin ointment (BACTROBAN) 2 % Apply 1 application topically daily.    Marland Kitchen omeprazole (PRILOSEC) 20 MG capsule Take 20 mg by mouth daily as needed. For  acid reflux    . quinapril (ACCUPRIL) 20 MG tablet Take 1 tablet (20 mg total) by mouth at bedtime. 30 tablet 6  . sertraline (ZOLOFT) 100 MG tablet Take 100 mg by mouth daily.    . Tamsulosin HCl (FLOMAX) 0.4 MG CAPS Take 1 capsule (0.4 mg total) by mouth every morning. 30 capsule 0   No current facility-administered medications for this visit.     Social History   Socioeconomic History  . Marital status: Married    Spouse name: jody  . Number of children: 2  . Years of education: college  . Highest education level:  Not on file  Occupational History  . Occupation: retired  Scientific laboratory technician  . Financial resource strain: Not on file  . Food insecurity:    Worry: Not on file    Inability: Not on file  . Transportation needs:    Medical: Not on file    Non-medical: Not on file  Tobacco Use  . Smoking status: Never Smoker  . Smokeless tobacco: Never Used  Substance and Sexual Activity  . Alcohol use: No    Alcohol/week: 0.0 standard drinks  . Drug use: No  . Sexual activity: Not on file  Lifestyle  . Physical activity:    Days per week: Not on file    Minutes per session: Not on file  . Stress: Not on file  Relationships  . Social connections:    Talks on phone: Not on file    Gets together: Not on file    Attends religious service: Not on file    Active member of club or organization: Not on file    Attends meetings of clubs or organizations: Not on file    Relationship status: Not on file  . Intimate partner violence:    Fear of current or ex partner: Not on file    Emotionally abused: Not on file    Physically abused: Not on file    Forced sexual activity: Not on file  Other Topics Concern  . Not on file  Social History Narrative  . Not on file    Family History  Problem Relation Age of Onset  . Lung cancer Mother   . Heart disease Father   . Heart attack Father   . Anesthesia problems Neg Hx   . Hypotension Neg Hx   . Pseudochol deficiency Neg Hx      ROS General: Negative; No fevers, chills, or night sweats HEENT: Negative; No changes in vision or hearing, sinus congestion, difficulty swallowing Pulmonary: Negative; No cough, wheezing, shortness of breath, hemoptysis Cardiovascular: See HPI: No chest pain, presyncope, syncope, palpatations GI: Positive for occasional GERD GU: Negative; No dysuria, hematuria, or difficulty voiding Musculoskeletal: Negative; no myalgias, joint pain, or weakness Hematologic: Negative; no easy bruising, bleeding Endocrine: Positive for diabetes mellitus and hypothyroidism Neuro: Negative; no changes in balance, headaches Skin: Negative; No rashes or skin lesions Psychiatric: Negative; No behavioral problems, depression Sleep: Positive for obstructive sleep apnea on CPAP therapy No snoring,  daytime sleepiness, hypersomnolence, bruxism, restless legs, hypnogognic hallucinations. Other comprehensive 14 point system review is negative   Physical Exam BP (!) 141/82   Pulse 83   Wt 242 lb 6.4 oz (110 kg)   BMI 32.88 kg/m    Repeat blood pressure by me was 160/80.  Wt Readings from Last 3 Encounters:  02/03/18 242 lb 6.4 oz (110 kg)  01/15/18 245 lb (111.1 kg)  12/27/17 253 lb (114.8 kg)   General: Alert, oriented, no distress.  Skin: normal turgor, no rashes, warm and dry HEENT: Normocephalic, atraumatic. Pupils equal round and reactive to light; sclera anicteric; extraocular muscles intact; Fundi without  hemorrhages or exudates Nose without nasal septal hypertrophy Mouth/Parynx benign; Mallinpatti scale 3 Neck: No JVD, no carotid bruits; normal carotid upstroke Lungs: clear to ausculatation and percussion; no wheezing or rales Chest wall: without tenderness to palpitation Heart: PMI not displaced, RRR, s1 s2 normal, 2-3/6 at least mid peaking harsh systolic murmur c/w aortic stenosis, no diastolic murmur, no rubs, gallops, thrills, or heaves Abdomen: soft, nontender; no hepatosplenomehaly,  BS+; abdominal aorta  nontender and not dilated by palpation. Back: no CVA tenderness Pulses 2+ Musculoskeletal: full range of motion, normal strength, no joint deformities Extremities: no clubbing cyanosis or edema, Homan's sign negative  Neurologic: grossly nonfocal; Cranial nerves grossly wnl Psychologic: Normal mood and affect   ECG (independently read by me): Atrial sensing, ventricular paced 100% ventricular rate at 83 bpm.  Aug 31, 2014 ECG (independently read by me): Appropriate atrial sensing and capture, 100% ventricular pacing  LABS:  BMP Latest Ref Rng & Units 01/15/2018 08/09/2014 07/18/2011  Glucose 65 - 99 mg/dL 164(H) 135(H) 189(H)  BUN 8 - 27 mg/dL 33(H) 20 17  Creatinine 0.76 - 1.27 mg/dL 1.45(H) 1.23 0.99  BUN/Creat Ratio 10 - 24 23 - -  Sodium 134 - 144 mmol/L 140 137 140  Potassium 3.5 - 5.2 mmol/L 4.1 3.4(L) 4.4  Chloride 96 - 106 mmol/L 100 101 99  CO2 20 - 29 mmol/L 25 30 30   Calcium 8.6 - 10.2 mg/dL 9.6 9.6 10.0     Hepatic Function Latest Ref Rng & Units 07/18/2011  Total Protein 6.0 - 8.3 g/dL 7.6  Albumin 3.5 - 5.2 g/dL 4.1  AST 0 - 37 U/L 20  ALT 0 - 53 U/L 27  Alk Phosphatase 39 - 117 U/L 67  Total Bilirubin 0.3 - 1.2 mg/dL 1.2    CBC Latest Ref Rng & Units 12/21/2015 08/09/2014 07/18/2011  WBC 4.6 - 10.2 K/uL 8.1 7.7 10.0  Hemoglobin 14.1 - 18.1 g/dL 13.8(A) 14.3 14.9  Hematocrit 43.5 - 53.7 % 38.6(A) 42.0 42.8  Platelets 150.0 - 400.0 K/uL - 219.0 229   Lab Results  Component Value Date   MCV 87.3 12/21/2015   MCV 91.0 08/09/2014   MCV 84.8 07/18/2011    Lab Results  Component Value Date   TSH 5.20 (H) 08/09/2014    BNP No results found for: BNP  ProBNP No results found for: PROBNP   Lipid Panel  No results found for: CHOL, TRIG, HDL, CHOLHDL, VLDL, LDLCALC, LDLDIRECT   RADIOLOGY: No results found.  IMPRESSION: 1. Moderate aortic stenosis   2. Essential hypertension   3. Dyspnea on exertion   4. Hyperlipidemia with target  LDL less than 70   5. OSA on CPAP   6. Pacemaker: s/p CHB   7. Type 2 diabetes mellitus with peripheral neuropathy (HCC)   8. Hypothyroidism, unspecified type     ASSESSMENT AND PLAN: Mr. Maretta Los is a 67 year old gentleman who has significant cardiovascular comorbidities including hypertension, type 2 diabetes mellitus, hyperlipidemia, and has been on CPAP therapy for significant obstructive sleep apnea.  He also has a history of hypothyroidism and peripheral neuropathy.  He developed complete heart block and underwent permanent pacemaker insertion.  He continues to have normal device function.  I have not seen him since May 2016.  On exam it appears that his aortic stenosis murmur has progressed.  He has had an echo Doppler study in April and again on January 28, 2018 which showed at least moderate aortic stenosis.  On his most recent echo his mean gradient was 24, peak 45 and valve area 1.07 cm.  Pulmonary pressures were normal.  He has had some issues in the past with increasing shortness of breath but also at times had noticed some episodes of dehydration.  He has at least a 10-year history of diabetes mellitus.  I reviewed his most recent laboratory from September which reveals an increased BUN at 33 and creatinine of 1.45 consistent with  some component of prerenal azotemia with at least grade 3 chronic kidney disease.  I am suggesting he decrease HCTZ from 25 mg down to 12.5 mg daily.  His wife notices that at times he may not be breathing well despite using his CPAP.  He has not been reevaluated for this in years.  His DME company is [facility] and I have suggested that he have a download be obtained.  He may qualify for a new machine if his machine is over 8 years old and may be worthwhile to consider reevaluation of his sleep apnea severity and therapy.  I have recommended he discontinue ibuprofen with his renal insufficiency.  He is been on atorvastatin 10 mg for hyperlipidemia.  With  his diabetes mellitus, target LDL is less than 70.  Most recent lipid studies in August 2019 showed an LDL increased at 86.  In the past he apparently he was intolerant to list Crestor and Zocor.  If he cannot tolerate higher dose of atorvastatin the addition of Zetia would be beneficial to hopefully allow him to reach his target LDL.  He will be seeing Dr. [name] with follow-up laboratory as well as Dr. Buddy Duty.  I will see him in 3 months for reevaluation.  I have recommended in 6 months we repeat an echo Doppler study to further evaluate the progression of his aortic stenosis.  I reviewed the symptoms associated with aortic stenosis.  He tells me also his Zoloft was recently increased to 200 mg.    Time spent: 40 minutes  Troy Sine, MD, [facility]  02/06/2018 3:15 PM

## 2018-02-04 NOTE — Telephone Encounter (Signed)
Patient seen in office yesterday and Dr Claiborne Billings addressed ECHO at visit.

## 2018-02-06 ENCOUNTER — Encounter: Payer: Self-pay | Admitting: Cardiovascular Disease

## 2018-02-19 ENCOUNTER — Ambulatory Visit: Payer: Medicare Other | Admitting: Cardiovascular Disease

## 2018-03-25 ENCOUNTER — Encounter: Payer: Self-pay | Admitting: Cardiology

## 2018-05-12 ENCOUNTER — Ambulatory Visit: Payer: Medicare Other | Admitting: Cardiovascular Disease

## 2018-05-12 ENCOUNTER — Encounter: Payer: Self-pay | Admitting: Cardiovascular Disease

## 2018-05-12 VITALS — BP 138/82 | HR 81 | Ht 72.0 in | Wt 244.0 lb

## 2018-05-12 DIAGNOSIS — I10 Essential (primary) hypertension: Secondary | ICD-10-CM | POA: Diagnosis not present

## 2018-05-12 DIAGNOSIS — G4733 Obstructive sleep apnea (adult) (pediatric): Secondary | ICD-10-CM | POA: Diagnosis not present

## 2018-05-12 DIAGNOSIS — Z9989 Dependence on other enabling machines and devices: Secondary | ICD-10-CM

## 2018-05-12 DIAGNOSIS — E785 Hyperlipidemia, unspecified: Secondary | ICD-10-CM | POA: Diagnosis not present

## 2018-05-12 DIAGNOSIS — I350 Nonrheumatic aortic (valve) stenosis: Secondary | ICD-10-CM | POA: Diagnosis not present

## 2018-05-12 DIAGNOSIS — Z950 Presence of cardiac pacemaker: Secondary | ICD-10-CM

## 2018-05-12 DIAGNOSIS — E1142 Type 2 diabetes mellitus with diabetic polyneuropathy: Secondary | ICD-10-CM

## 2018-05-12 MED ORDER — IRBESARTAN 150 MG PO TABS: 150.0000 mg | ORAL_TABLET | Freq: Every day | ORAL | 1 refills | Status: DC

## 2018-05-12 NOTE — Patient Instructions (Signed)
Medication Instructions:  Start Irbesartan 150 mg  Stop Quinapril  If you need a refill on your cardiac medications before your next appointment, please call your pharmacy.   Testing/Procedures: Echocardiogram (in MAY) - Your physician has requested that you have an echocardiogram. Echocardiography is a painless test that uses sound waves to create images of your heart. It provides your doctor with information about the size and shape of your heart and how well your heart's chambers and valves are working. This procedure takes approximately one hour. There are no restrictions for this procedure. This will be performed at our [facility] location - [address], [address].   Follow-Up: At [facility], you and your health needs are our priority.  As part of our continuing mission to provide you with exceptional heart care, we have created designated Provider Care Teams.  These Care Teams include your primary Cardiologist (physician) and Advanced Practice Providers (APPs -  Physician Assistants and Nurse Practitioners) who all work together to provide you with the care you need, when you need it. You will need a follow up appointment in 3 months (After ECHO).  Please call our office 2 months in advance to schedule this appointment.  You may see [name], MD or one of the following Advanced Practice Providers on your designated Care Team: [place], [place] . Fabian Sharp, PA-C

## 2018-05-12 NOTE — Progress Notes (Signed)
Patient ID: Tony [name], male   DOB: 06-May-1950, 68 y.o.   MRN: [id number]     HPI: Tony [name] is a 68 y.o. male who presents to the office today for a 3 month follow up cardiology evaluation.   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 He has a long-standing history of hypertension, diabetes mellitus type 2, hyperlipidemia, hypothyroidism, peripheral neuropathy, obstructive sleep apnea on CPAP therapy.  He has a history of PVCs and had been on low dose beta blocker therapy with suppression.  Since he has retired, he has continued to participate in the cardiac wellness program.  An echo Doppler study in 2013 showed an ejection fraction of 55-60%.  He has chronic right bundle branch block.  In April 2016 he was found to have bradycardia on heart monitor at rehabilitation.  An ECG demonstrated complete heart block.  He complained of dizziness but did not have syncope.  He had been off all AV nodal blocking agents for a week.  He was referred to Dr. [name] and underwent implantation of a MDT dual-chamber permanent pacemaker on 08/11/2014.  An echo Doppler study prior to implantation revealed normal LV function with evidence for calcified aortic valve with mild-to-moderate stenosis.  There was mild left atrial enlargement.  I had seen him in 2014 and last saw him in May 2016 following his pacemaker implantation.  Over the last 3+ years, he has been fairly stable.  He is followed primarily by Dr. [name] and also sees Dr. Robina Ade.  He has been on CPAP therapy but has not had any recent evaluations.  Recently, he has noticed that he has become more lethargic and fatigue particularly with walking up steps.  He denies associated chest pain.  He admits that he sweats profusely.  His symptoms did improve with hydration.  He has a history of diabetes mellitus for 10 years.  He has a history of hypertension.  He has been on  amlodipine 10 mg, hydrochlorothiazide 25 mg daily, metoprolol 12.5 mg twice a day and quinapril 20 mg.  With reference to his diabetes he has been on Invokana 100 mg, metformin 500 mg, glipizide 20 mg 2 times a day before meal and 10 units of insulin.  He is on levothyroxine 175 mcg and alternates 1 and 1/2 tablets every other day. He is followed by Dr. Buddy Duty.  He underwent an echo Doppler study in April 2019 which showed moderate LVH with estimated EF of 55% without definitive wall motion abnormalities.  His mean aortic gradient was 20 mm with a peak gradient of 52 mm.  He was felt to have moderate aortic stenosis.  He was seen by Dr. [name] in August 2019 and had normal pacemaker function.  He was seen by Doreene Adas, PA on January 15, 2018 and admitted to episodes of lower diastolic blood pressure.  He underwent a follow-up echo Doppler study which showed an EF of 50 to 55% on January 28, 2018.  There was moderate LVH.  His mean aortic gradient was 24 with a peak gradient of 45 and it was again felt that his stenosis was moderate.   He presented to establish cardiology care with me on February 03, 2018.  At that time he denied any episodes of chest pain or recent palpitations.  There was no presyncope or syncope.  He had experienced some mild shortness of breath with activity.  I had reviewed his  history and recent echo Doppler data.  He was using CPAP therapy but his wife also had noticed that times he did not appear to be breathing well despite using his CPAP.  I discussed that if his machine was greater than 53 years old it may be worthwhile to consider reevaluation of sleep apnea severity and therapy.  His laboratory had shown renal insufficiency and I suggested discontinuance of ibuprofen.  He was on atorvastatin 10 mg for hyperlipidemia.  With his diabetes mellitus I discussed target LDL less than 70.  Remotely he did not tolerate Crestor or Zocor and raised the possibility of adding Zetia.  He has  subsequently had his diabetes followed up with Dr. Buddy Duty.  His hemoglobin A1c had been significantly increased over 11 and most recently this has improved but is still elevated at 8.4.  Upon further questioning he admits to experiencing an occasional dry cough.  He does have GERD issues and recently developed significant heartburn eating spicy [place].  He also recently had a stomach virus.  He presents for reevaluation.   Past Medical History:  Diagnosis Date  . Cancer ([place])    thyroid  . Complete heart block (HCC)    a. s/p MDT dual chamber PPM followed by Dr [name]   . Complication of anesthesia    1985 after Thyriodectomy hard time waking up  . Diabetes mellitus   . GERD (gastroesophageal reflux disease)   . Hypertension   . Hypothyroidism    Had two surgeries for Cancer    Past Surgical History:  Procedure Laterality Date  . LUMBAR LAMINECTOMY/DECOMPRESSION MICRODISCECTOMY  07/19/2011   Procedure: LUMBAR LAMINECTOMY/DECOMPRESSION MICRODISCECTOMY 3 LEVELS;  Surgeon: Melina Schools, MD;  Location: [address];  Service: Orthopedics;  Laterality: Left;  Lumbar three-Lumbar five LEFT DECOMPRESSION AND FORAMINOTOMY Lumbar three-four LEFT DISCECTOMY  . PERMANENT PACEMAKER INSERTION N/A 08/11/2014   MDT Adapta L implanted by Dr [name] for CHB  . Thyroidectomy x2      Allergies  Allergen Reactions  . Corticosteroids   . Crestor [Rosuvastatin Calcium] Other (See Comments)    Leg pain  . Simvastatin Other (See Comments)    Leg pain    Current Outpatient Medications  Medication Sig Dispense Refill  . acetaminophen (TYLENOL) 500 MG tablet Take 500 mg by mouth every 6 (six) hours as needed (pain).     Marland Kitchen amLODipine (NORVASC) 10 MG tablet Take 10 mg by mouth daily.     Marland Kitchen aspirin EC 81 MG tablet Take 81 mg by mouth daily.    Marland Kitchen atorvastatin (LIPITOR) 10 MG tablet Take 10 mg by mouth daily.    . cyclobenzaprine (FLEXERIL) 10 MG tablet Take 10 mg by mouth 3 (three) times daily as needed. For muscle  pain.    Marland Kitchen glipiZIDE (GLUCOTROL) 10 MG tablet Take 20 mg by mouth 2 (two) times daily before a meal.     . hydrochlorothiazide (HYDRODIURIL) 25 MG tablet Take 0.5 tablets (12.5 mg total) by mouth daily. 45 tablet 3  . ibuprofen (ADVIL,MOTRIN) 800 MG tablet Take 800 mg by mouth every 8 (eight) hours as needed. For pain.    . INVOKANA 100 MG TABS tablet Take 100 mg by mouth daily before breakfast.     . levothyroxine (SYNTHROID, LEVOTHROID) 175 MCG tablet Take 175 mcg by mouth daily before breakfast. Alt with 1 and 1/2 tablets daily    . metaxalone (SKELAXIN) 800 MG tablet Take 800 mg by mouth 3 (three) times daily as needed for muscle  spasms.    . metFORMIN (GLUCOPHAGE) 500 MG tablet Take 1,000 mg by mouth 2 (two) times daily with a meal.    . metoprolol tartrate (LOPRESSOR) 25 MG tablet Take 0.5 tablets (12.5 mg total) by mouth 2 (two) times daily. 180 tablet 1  . mupirocin ointment (BACTROBAN) 2 % Apply 1 application topically daily.    Marland Kitchen omeprazole (PRILOSEC) 20 MG capsule Take 20 mg by mouth daily as needed. For acid reflux    . sertraline (ZOLOFT) 100 MG tablet Take 100 mg by mouth daily.    . Tamsulosin HCl (FLOMAX) 0.4 MG CAPS Take 1 capsule (0.4 mg total) by mouth every morning. 30 capsule 0  . irbesartan (AVAPRO) 150 MG tablet Take 1 tablet (150 mg total) by mouth daily. 90 tablet 1   No current facility-administered medications for this visit.     Social History   Socioeconomic History  . Marital status: Married    Spouse name: jody  . Number of children: 2  . Years of education: college  . Highest education level: Not on file  Occupational History  . Occupation: retired  Scientific laboratory technician  . Financial resource strain: Not on file  . Food insecurity:    Worry: Not on file    Inability: Not on file  . Transportation needs:    Medical: Not on file    Non-medical: Not on file  Tobacco Use  . Smoking status: Never Smoker  . Smokeless tobacco: Never Used  Substance and Sexual  Activity  . Alcohol use: No    Alcohol/week: 0.0 standard drinks  . Drug use: No  . Sexual activity: Not on file  Lifestyle  . Physical activity:    Days per week: Not on file    Minutes per session: Not on file  . Stress: Not on file  Relationships  . Social connections:    Talks on phone: Not on file    Gets together: Not on file    Attends religious service: Not on file    Active member of club or organization: Not on file    Attends meetings of clubs or organizations: Not on file    Relationship status: Not on file  . Intimate partner violence:    Fear of current or ex partner: Not on file    Emotionally abused: Not on file    Physically abused: Not on file    Forced sexual activity: Not on file  Other Topics Concern  . Not on file  Social History Narrative  . Not on file    Family History  Problem Relation Age of Onset  . Lung cancer Mother   . Heart disease Father   . Heart attack Father   . Anesthesia problems Neg Hx   . Hypotension Neg Hx   . Pseudochol deficiency Neg Hx     ROS General: Negative; No fevers, chills, or night sweats HEENT: Negative; No changes in vision or hearing, sinus congestion, difficulty swallowing Pulmonary: Negative; No cough, wheezing, shortness of breath, hemoptysis Cardiovascular: See HPI: No chest pain, presyncope, syncope, palpatations GI: Positive for occasional GERD GU: Negative; No dysuria, hematuria, or difficulty voiding Musculoskeletal: Negative; no myalgias, joint pain, or weakness Hematologic: Negative; no easy bruising, bleeding Endocrine: Positive for diabetes mellitus and hypothyroidism Neuro: Negative; no changes in balance, headaches Skin: Negative; No rashes or skin lesions Psychiatric: Negative; No behavioral problems, depression Sleep: Positive for obstructive sleep apnea on CPAP therapy No snoring,  daytime sleepiness, hypersomnolence, bruxism,  restless legs, hypnogognic hallucinations. Other comprehensive 14  point system review is negative   Physical Exam BP 138/82   Pulse 81   Ht 6' (1.829 m)   Wt 244 lb (110.7 kg)   BMI 33.09 kg/m    Repeat blood pressure by me was 140/80  Wt Readings from Last 3 Encounters:  05/12/18 244 lb (110.7 kg)  02/03/18 242 lb 6.4 oz (110 kg)  01/15/18 245 lb (111.1 kg)   General: Alert, oriented, no distress.  Skin: normal turgor, no rashes, warm and dry HEENT: Normocephalic, atraumatic. Pupils equal round and reactive to light; sclera anicteric; extraocular muscles intact;  Nose without nasal septal hypertrophy Mouth/Parynx benign; Mallinpatti scale 3 Neck: No JVD, no carotid bruits; normal carotid upstroke Lungs: clear to ausculatation and percussion; no wheezing or rales Chest wall: without tenderness to palpitation Heart: PMI not displaced, RRR, s1 s2 normal, 2-3/6 mid peaking harsh systolic murmur c/w aortic stenosis, no diastolic murmur, no rubs, gallops, thrills, or heaves Abdomen: soft, nontender; no hepatosplenomehaly, BS+; abdominal aorta nontender and not dilated by palpation. Back: no CVA tenderness Pulses 2+ Musculoskeletal: full range of motion, normal strength, no joint deformities Extremities: no clubbing cyanosis or edema, Homan's sign negative  Neurologic: grossly nonfocal; Cranial nerves grossly wnl Psychologic: Normal mood and affect   ECG (independently read by me): Atrially sensing, ventricular paced rhythm with 100% ventricular capture.  Heart rate 81 bpm.  No ectopy  ECG (independently read by me): Atrial sensing, ventricular paced 100% ventricular rate at 83 bpm.  Aug 31, 2014 ECG (independently read by me): Appropriate atrial sensing and capture, 100% ventricular pacing  LABS:  BMP Latest Ref Rng & Units 01/15/2018 08/09/2014 07/18/2011  Glucose 65 - 99 mg/dL 164(H) 135(H) 189(H)  BUN 8 - 27 mg/dL 33(H) 20 17  Creatinine 0.76 - 1.27 mg/dL 1.45(H) 1.23 0.99  BUN/Creat Ratio 10 - 24 23 - -  Sodium 134 - 144 mmol/L 140 137  140  Potassium 3.5 - 5.2 mmol/L 4.1 3.4(L) 4.4  Chloride 96 - 106 mmol/L 100 101 99  CO2 20 - 29 mmol/L _0 Calcium 8.6 - 10.2 mg/dL 9.6 9.6 10.0     Hepatic Function Latest Ref Rng & Units 07/18/2011  Total Protein 6.0 - 8.3 g/dL 7.6  Albumin 3.5 - 5.2 g/dL 4.1  AST 0 - 37 U/L 20  ALT 0 - 53 U/L 27  Alk Phosphatase 39 - 117 U/L 67  Total Bilirubin 0.3 - 1.2 mg/dL 1.2    CBC Latest Ref Rng & Units 12/21/2015 08/09/2014 07/18/2011  WBC 4.6 - 10.2 K/uL 8.1 7.7 10.0  Hemoglobin 14.1 - 18.1 g/dL 13.8(A) 14.3 14.9  Hematocrit 43.5 - 53.7 % 38.6(A) 42.0 42.8  Platelets 150.0 - 400.0 K/uL - 219.0 229   Lab Results  Component Value Date   MCV 87.3 12/21/2015   MCV 91.0 08/09/2014   MCV 84.8 07/18/2011    Lab Results  Component Value Date   TSH 5.20 (H) 08/09/2014    BNP No results found for: BNP  ProBNP No results found for: PROBNP   Lipid Panel  No results found for: CHOL, TRIG, HDL, CHOLHDL, VLDL, LDLCALC, LDLDIRECT   RADIOLOGY: No results found.  IMPRESSION: 1. Essential hypertension   2. Moderate aortic stenosis   3. Hyperlipidemia with target LDL less than 70   4. OSA on CPAP   5. Type 2 diabetes mellitus with peripheral neuropathy (HCC)   6. Pacemaker: s/p CHB  ASSESSMENT AND PLAN: Tony [name] is a 68 year old gentleman who has significant cardiovascular comorbidities including hypertension, type 2 diabetes mellitus for at least 10 years, hyperlipidemia, and has been on CPAP therapy for significant obstructive sleep apnea.  He also has a history of hypothyroidism and peripheral neuropathy.  He developed complete heart block and underwent permanent pacemaker insertion.  He continues to have normal device function.  I had not seen him since May 2016 till he presented for reevaluation in October 2019.  On exam it appears that his aortic stenosis murmur has progressed.  An echo Doppler study in April and  January 28, 2018 showed at least moderate aortic  stenosis.  On his most recent echo his mean gradient was 24, peak 45 and valve area 1.07 cm.  Pulmonary pressures were normal.  He has had some issues in the past with increasing shortness of breath but also at times had noticed some episodes of dehydration.  Laboratory from September 2019 revealed an increased BUN at 33 and creatinine of 1.45 consistent with some component of prerenal azotemia with at least grade 3 chronic kidney disease.  When I saw him I reduced his HCTZ from 25 mg down to 12.5 mg.  We discussed possible reevaluation first sleep apnea.  We also discussed the importance of significantly improved diabetic management.  On exam today, blood pressure continues to be mildly elevated but improved from his last visit.  With his history of intermittent cough I have recommended discontinuance of quinapril and in its place particularly with his diabetes mellitus have suggested Erbe Sartain 150 mg initially.  Hopefully he will tolerate this and this should have some potential protective effect with reference to diabetic kidney disease.  Recent laboratory in December had shown a creatinine of 1.17 which improved following reduction of HCTZ.  He will be following up with Dr. Buddy Duty for his diabetes.  In May I have suggested he undergo a follow-up echo Doppler study to reassess his aortic stenosis and will see him in follow-up for further evaluation.  Time spent: 25 minutes   Troy Sine, MD, [facility]  05/13/2018 3:15 PM

## 2018-05-13 ENCOUNTER — Encounter: Payer: Self-pay | Admitting: Cardiovascular Disease

## 2018-05-19 ENCOUNTER — Ambulatory Visit (INDEPENDENT_AMBULATORY_CARE_PROVIDER_SITE_OTHER): Payer: Medicare Other

## 2018-05-19 ENCOUNTER — Ambulatory Visit (INDEPENDENT_AMBULATORY_CARE_PROVIDER_SITE_OTHER): Payer: Medicare Other | Admitting: Orthopaedic Surgery

## 2018-05-19 ENCOUNTER — Ambulatory Visit (INDEPENDENT_AMBULATORY_CARE_PROVIDER_SITE_OTHER): Payer: Self-pay

## 2018-05-19 ENCOUNTER — Encounter (INDEPENDENT_AMBULATORY_CARE_PROVIDER_SITE_OTHER): Payer: Self-pay | Admitting: Orthopaedic Surgery

## 2018-05-19 DIAGNOSIS — M25532 Pain in left wrist: Secondary | ICD-10-CM

## 2018-05-19 DIAGNOSIS — M25562 Pain in left knee: Secondary | ICD-10-CM | POA: Diagnosis not present

## 2018-05-19 DIAGNOSIS — M25511 Pain in right shoulder: Secondary | ICD-10-CM

## 2018-05-19 NOTE — Progress Notes (Signed)
Office Visit Note   Patient: Tony [name]           Date of Birth: [birth year]           MRN: [id number] Visit Date: 05/19/2018              Requested by: Lajean Manes, MD [address] [place], [place] [address] PCP: Lajean Manes, MD   Assessment & Plan: Visit Diagnoses:  1. Left knee pain, unspecified chronicity   2. Right shoulder pain, unspecified chronicity   3. Pain in left wrist     Plan: Injury to right shoulder and left wrist yesterday while trying to wheel his wife in a wheelchair.  He felt to the ground with initial onset of right shoulder and left wrist pain.  Presently doing fairly well.  Did see "scan" his right knee but it has appears to be quite superficial.  No evidence of fracture by films.  Will monitor his course.  I think this is all soft tissue and resolve on its own  Follow-Up Instructions: Return if symptoms worsen or fail to improve.   Orders:  Orders Placed This Encounter  Procedures  . XR Shoulder Right  . XR Wrist Complete Left   No orders of the defined types were placed in this encounter.     Procedures: No procedures performed   Clinical Data: No additional findings.   Subjective: Chief Complaint  Patient presents with  . Right Shoulder - Pain  . Left Wrist - Pain  . Knee Pain    Injury yesterday-fell down---Rt knee--scratches/bleeding, Lt wrist--medial side pain  . Shoulder Pain    injury pain  [name] fell yesterday while helping his wife in a wheelchair.  He fell trying to break his fall with his right upper extremity initial onset of right shoulder pain.  Is not had any ecchymosis or skin changes.  He is able to easily raise his arm over his head.  Definitely better today than he was yesterday.  Also having some discomfort in the dorsum of his left hand but has good grip and good release and no numbness or tingling .  Abraded his right knee  HPI  Review of Systems   Objective: Vital Signs: There were no  vitals taken for this visit.  Physical Exam Constitutional:      Appearance: He is well-developed.  Eyes:     Pupils: Pupils are equal, round, and reactive to light.  Pulmonary:     Effort: Pulmonary effort is normal.  Skin:    General: Skin is warm and dry.  Neurological:     Mental Status: He is alert and oriented to person, place, and time.  Psychiatric:        Behavior: Behavior normal.     Ortho Exam awake alert and oriented x3.  Comfortable sitting.  Has a superficial abrasion over the anterior aspect of his right knee and near the patella.  Able to fully extend his knee.  Some bruising in that same area but able to fully extend his knee and flex without any difficulty and bear weight without a limp.  No knee effusion.  Some discomfort in the area of this index metacarpal but no skin changes no crepitation and no obvious deformity.  Normal sensibility.  Does have some tenderness over the acromioclavicular joint of the right shoulder with some mild swelling but able to place his arm fully over his head.  Some crossarm tenderness.  No skin  changes.  Biceps intact.  Good strength  Specialty Comments:  No specialty comments available.  Imaging: Xr Wrist Complete Left  Result Date: 05/19/2018 Films of the left wrist were obtained in several projections.  No evidence of any fracture or dislocation.  Xr Shoulder Right  Result Date: 05/19/2018 Films of the right shoulder obtained in several projections.  There are some degenerative changes at the chromic clavicular joint but no evidence of fracture or subluxation.  Head is centered about the glenoid    PMFS History: Patient Active Problem List   Diagnosis Date Noted  . Toe infection 06/10/2017  . Ganglion cyst 12/19/2015  . Aortic stenosis 09/01/2014  . Essential hypertension 09/01/2014  . Type 2 diabetes mellitus with peripheral neuropathy ([place]) 09/01/2014  . Hypothyroidism 09/01/2014  . OSA on CPAP 09/01/2014  . CHB  (complete heart block) ([place]) 08/11/2014  . Complete heart block ([place]) 08/05/2014  . Lumbar stenosis 07/16/2011  . HNP (herniated nucleus pulposus), lumbar 07/16/2011   Past Medical History:  Diagnosis Date  . Cancer ([place])    thyroid  . Complete heart block (HCC)    a. s/p MDT dual chamber PPM followed by Dr [name]   . Complication of anesthesia    1985 after Thyriodectomy hard time waking up  . Diabetes mellitus   . GERD (gastroesophageal reflux disease)   . Hypertension   . Hypothyroidism    Had two surgeries for Cancer    Family History  Problem Relation Age of Onset  . Lung cancer Mother   . Heart disease Father   . Heart attack Father   . Anesthesia problems Neg Hx   . Hypotension Neg Hx   . Pseudochol deficiency Neg Hx     Past Surgical History:  Procedure Laterality Date  . LUMBAR LAMINECTOMY/DECOMPRESSION MICRODISCECTOMY  07/19/2011   Procedure: LUMBAR LAMINECTOMY/DECOMPRESSION MICRODISCECTOMY 3 LEVELS;  Surgeon: Melina Schools, MD;  Location: [facility];  Service: Orthopedics;  Laterality: Left;  Lumbar three-Lumbar five LEFT DECOMPRESSION AND FORAMINOTOMY Lumbar three-four LEFT DISCECTOMY  . PERMANENT PACEMAKER INSERTION N/A 08/11/2014   MDT Adapta L implanted by Dr [name] for CHB  . Thyroidectomy x2     Social History   Occupational History  . Occupation: retired  Tobacco Use  . Smoking status: Never Smoker  . Smokeless tobacco: Never Used  Substance and Sexual Activity  . Alcohol use: No    Alcohol/week: 0.0 standard drinks  . Drug use: No  . Sexual activity: Not on file     Garald Balding, MD   Note - This record has been created using [employer].  Chart creation errors have been sought, but may not always  have been located. Such creation errors do not reflect on  the standard of medical care.

## 2018-09-08 ENCOUNTER — Telehealth (HOSPITAL_COMMUNITY): Payer: Self-pay

## 2018-09-08 NOTE — Telephone Encounter (Signed)

## 2018-09-08 NOTE — Telephone Encounter (Signed)
Left message to call back for COVID prescreening.

## 2018-09-11 ENCOUNTER — Other Ambulatory Visit: Payer: Self-pay

## 2018-09-11 ENCOUNTER — Ambulatory Visit (HOSPITAL_COMMUNITY): Payer: Medicare Other | Attending: Cardiology

## 2018-09-11 DIAGNOSIS — I10 Essential (primary) hypertension: Secondary | ICD-10-CM | POA: Diagnosis present

## 2018-09-12 ENCOUNTER — Encounter: Payer: Self-pay | Admitting: Family Medicine

## 2018-09-12 ENCOUNTER — Ambulatory Visit (INDEPENDENT_AMBULATORY_CARE_PROVIDER_SITE_OTHER): Payer: Medicare Other | Admitting: Family Medicine

## 2018-09-12 ENCOUNTER — Ambulatory Visit: Payer: Self-pay

## 2018-09-12 DIAGNOSIS — S92355A Nondisplaced fracture of fifth metatarsal bone, left foot, initial encounter for closed fracture: Secondary | ICD-10-CM

## 2018-09-12 DIAGNOSIS — M79672 Pain in left foot: Secondary | ICD-10-CM

## 2018-09-12 MED ORDER — HYDROCODONE-ACETAMINOPHEN 5-325 MG PO TABS: 1.0000 | ORAL_TABLET | Freq: Four times a day (QID) | ORAL | 0 refills | Status: DC | PRN

## 2018-09-12 NOTE — Patient Instructions (Signed)
   Vitamin D3:  5,000 IU daily  Vitamin K2:  100 mcg daily  Magnesium:  200-400 mg daily    

## 2018-09-12 NOTE — Progress Notes (Signed)
Office Visit Note   Patient: Tony [name]           Date of Birth: Aug 01, 1950           MRN: [id number] Visit Date: 09/12/2018 Requested by: Lajean Manes, MD [address] [place], [place] [address] PCP: Lajean Manes, MD  Subjective: Chief Complaint  Patient presents with  . Left Foot - Pain    Pain in foot since stepping on vacuum cord yesterday. Heard a snapping sound in the foot.    HPI: He is here with left foot pain.  Yesterday he stepped on a vacuum cord, his foot inverted and he heard and felt a pop.  Immediate pain on the lateral aspect, hard to bear weight.  Pain made it hard to sleep last night, he wrapped with an Ace bandage and that helped.              ROS: He has well-controlled diabetes, history of hypertension and heart block.  He has hypothyroidism.  All other systems were reviewed and are negative.  Objective: Vital Signs: There were no vitals taken for this visit.  Physical Exam:  General:  Alert and oriented, in no acute distress. Pulm:  Breathing unlabored. Psy:  Normal mood, congruent affect. Skin: There is some early bruising on the lateral aspect of his foot. Left foot: Soft tissue swelling over the fifth metatarsal with tenderness to palpation midshaft.  No rotational deformity of his toe.  Flexor and extensor tendon functions are intact.  No other tenderness to palpation of the foot.  Imaging: X-rays left foot: Midshaft fifth metatarsal fracture extending halfway through the bone, not involving the medial cortex which is still intact.  No other fracture seen.  Assessment & Plan: 1.  Left foot midshaft incomplete fracture with anatomic alignment -Weightbearing as tolerated in a postop shoe. -Vitamin D3, vitamin K 2 and magnesium. -Hydrocodone as needed for pain. -Follow-up in about 2 to 3 weeks for recheck and 2 view foot x-ray.     Procedures: No procedures performed  No notes on file     PMFS History: Patient Active  Problem List   Diagnosis Date Noted  . Toe infection 06/10/2017  . Ganglion cyst 12/19/2015  . Aortic stenosis 09/01/2014  . Essential hypertension 09/01/2014  . Type 2 diabetes mellitus with peripheral neuropathy ([facility]) 09/01/2014  . Hypothyroidism 09/01/2014  . OSA on CPAP 09/01/2014  . CHB (complete heart block) ([facility]) 08/11/2014  . Complete heart block ([facility]) 08/05/2014  . Lumbar stenosis 07/16/2011  . HNP (herniated nucleus pulposus), lumbar 07/16/2011   Past Medical History:  Diagnosis Date  . Cancer ([facility])    thyroid  . Complete heart block (HCC)    a. s/p MDT dual chamber PPM followed by Dr [name]   . Complication of anesthesia    1985 after Thyriodectomy hard time waking up  . Diabetes mellitus   . GERD (gastroesophageal reflux disease)   . Hypertension   . Hypothyroidism    Had two surgeries for Cancer    Family History  Problem Relation Age of Onset  . Lung cancer Mother   . Heart disease Father   . Heart attack Father   . Anesthesia problems Neg Hx   . Hypotension Neg Hx   . Pseudochol deficiency Neg Hx     Past Surgical History:  Procedure Laterality Date  . LUMBAR LAMINECTOMY/DECOMPRESSION MICRODISCECTOMY  07/19/2011   Procedure: LUMBAR LAMINECTOMY/DECOMPRESSION MICRODISCECTOMY 3 LEVELS;  Surgeon: Melina Schools,  MD;  Location: [facility];  Service: Orthopedics;  Laterality: Left;  Lumbar three-Lumbar five LEFT DECOMPRESSION AND FORAMINOTOMY Lumbar three-four LEFT DISCECTOMY  . PERMANENT PACEMAKER INSERTION N/A 08/11/2014   MDT Adapta L implanted by Dr [name] for CHB  . Thyroidectomy x2     Social History   Occupational History  . Occupation: retired  Tobacco Use  . Smoking status: Never Smoker  . Smokeless tobacco: Never Used  Substance and Sexual Activity  . Alcohol use: No    Alcohol/week: 0.0 standard drinks  . Drug use: No  . Sexual activity: Not on file

## 2019-02-10 NOTE — Progress Notes (Signed)
Office Visit Note  Patient: Tony [name]             Date of Birth: 1950/06/07           MRN: [id number]             PCP: Lajean Manes, MD Referring: Lajean Manes, MD Visit Date: 02/24/2019 Occupation: Retired, Product/process development scientist  Subjective:  Pain in both hands.   History of Present Illness: Tony [name] is a 68 y.o. male seen in consultation per request of Dr. [name].  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  He states over the years he has noticed increasing stiffness and discomfort in his hands.  He has noticed that his hands have become more knobby.  He has not noticed any joint swelling.  He is having difficulty and stiffness and doing routine activities.  He has some arthritis in his feet which is not very painful.  He has history of degenerative disc disease of lumbar spine and had discectomy in the past by Dr. Rolena Infante.  He has intermittent left-sided radiculopathy.  He is also concerned about the neuropathy at night.  He states he sees Dr. Buddy Duty for diabetes.  He has taken Lyrica in the past which helped his symptoms remarkably.  He states he is been taking ibuprofen 600 to 800 mg at nighttime to relieve the discomfort in his legs at night.  Activities of Daily Living:  Patient reports morning stiffness for 5 minutes.   Patient Denies nocturnal pain.  Difficulty dressing/grooming: Denies Difficulty climbing stairs: Reports Difficulty getting out of chair: Reports Difficulty using hands for taps, buttons, cutlery, and/or writing: Denies  Review of Systems  Constitutional: Negative for fatigue and night sweats.  HENT: Negative for mouth sores, mouth dryness and nose dryness.   Eyes: Negative for redness and dryness.  Respiratory: Negative for shortness of breath and difficulty breathing.   Cardiovascular: Negative for chest pain, palpitations, hypertension, irregular heartbeat and swelling in legs/feet.  Gastrointestinal: Negative  for constipation and diarrhea.  Endocrine: Negative for increased urination.  Musculoskeletal: Positive for arthralgias, joint pain and morning stiffness. Negative for joint swelling, myalgias, muscle weakness, muscle tenderness and myalgias.  Skin: Negative for color change, rash, hair loss, nodules/bumps, skin tightness, ulcers and sensitivity to sunlight.  Allergic/Immunologic: Negative for susceptible to infections.  Neurological: Negative for dizziness, fainting, memory loss, night sweats and weakness ( ).  Hematological: Negative for swollen glands.  Psychiatric/Behavioral: Positive for depressed mood and sleep disturbance. The patient is not nervous/anxious.     PMFS History:  Patient Active Problem List   Diagnosis Date Noted  . Toe infection 06/10/2017  . Ganglion cyst 12/19/2015  . Aortic stenosis 09/01/2014  . Essential hypertension 09/01/2014  . Type 2 diabetes mellitus with peripheral neuropathy ([facility]) 09/01/2014  . Hypothyroidism 09/01/2014  . OSA on CPAP 09/01/2014  . CHB (complete heart block) ([facility]) 08/11/2014  . Complete heart block ([facility]) 08/05/2014  . Lumbar stenosis 07/16/2011  . HNP (herniated nucleus pulposus), lumbar 07/16/2011    Past Medical History:  Diagnosis Date  . Cancer ([facility])    thyroid  . Complete heart block (HCC)    a. s/p MDT dual chamber PPM followed by Dr [name]   . Complication of anesthesia    1985 after Thyriodectomy hard time waking up  . Diabetes mellitus   . GERD (gastroesophageal reflux disease)   . Hypertension   . Hypothyroidism    Had two  surgeries for Cancer    Family History  Problem Relation Age of Onset  . Lung cancer Mother   . Heart disease Father   . Heart attack Father   . Healthy Sister   . Healthy Daughter   . Healthy Son   . Anesthesia problems Neg Hx   . Hypotension Neg Hx   . Pseudochol deficiency Neg Hx    Past Surgical History:  Procedure Laterality Date  . LUMBAR LAMINECTOMY/DECOMPRESSION MICRODISCECTOMY   07/19/2011   Procedure: LUMBAR LAMINECTOMY/DECOMPRESSION MICRODISCECTOMY 3 LEVELS;  Surgeon: Melina Schools, MD;  Location: [facility];  Service: Orthopedics;  Laterality: Left;  Lumbar three-Lumbar five LEFT DECOMPRESSION AND FORAMINOTOMY Lumbar three-four LEFT DISCECTOMY  . PERMANENT PACEMAKER INSERTION N/A 08/11/2014   MDT Adapta L implanted by Dr [name] for CHB  . Thyroidectomy x2     Social History   Social History Narrative  . Not on file    There is no immunization history on file for this patient.   Objective: Vital Signs: BP 131/78 (BP Location: Right Arm, Patient Position: Sitting, Cuff Size: Normal)   Pulse 72   Resp 15   Ht 6' (1.829 m)   Wt 247 lb 3.2 oz (112.1 kg)   BMI 33.53 kg/m    Physical Exam Vitals signs and nursing note reviewed.  Constitutional:      Appearance: He is well-developed.  HENT:     Head: Normocephalic and atraumatic.  Eyes:     Conjunctiva/sclera: Conjunctivae normal.     Pupils: Pupils are equal, round, and reactive to light.  Neck:     Musculoskeletal: Normal range of motion and neck supple.  Cardiovascular:     Rate and Rhythm: Normal rate and regular rhythm.     Heart sounds: Normal heart sounds.  Pulmonary:     Effort: Pulmonary effort is normal.     Breath sounds: Normal breath sounds.  Abdominal:     General: Bowel sounds are normal.     Palpations: Abdomen is soft.  Skin:    General: Skin is warm and dry.     Capillary Refill: Capillary refill takes less than 2 seconds.  Neurological:     Mental Status: He is alert and oriented to person, place, and time.  Psychiatric:        Behavior: Behavior normal.      Musculoskeletal Exam: He has some limitation of range of motion of cervical spine.  He has limited range of motion of lumbar spine.  Shoulder joints elbow joints wrist joints with good range of motion.  He has DIP and PIP thickening in his hands consistent with osteoarthritis.  No synovitis was noted.  Hip joints and knee  joints in good range of motion.  He has some DIP and PIP thickening in his feet consistent with osteoarthritis.  Few calluses were noted on his toes.  CDAI Exam: CDAI Score: - Patient Global: -; Provider Global: - Swollen: -; Tender: - Joint Exam   No joint exam has been documented for this visit   There is currently no information documented on the homunculus. Go to the Rheumatology activity and complete the homunculus joint exam.  Investigation: No additional findings.  Imaging: Xr Hand 2 View Left  Result Date: 02/24/2019 PIP and DIP thickening was noted bilaterally.  No CMC, intercarpal radiocarpal joint space narrowing was noted.  No MCP or erosive changes were noted. Impression: These findings are consistent with osteoarthritis of the hand.  Xr Hand 2 View Right  Result Date: 02/24/2019 PIP and DIP thickening was noted bilaterally.  No CMC, intercarpal radiocarpal joint space narrowing was noted.  No MCP or erosive changes were noted. Impression: These findings are consistent with osteoarthritis of the hand.   Recent Labs: Lab Results  Component Value Date   WBC 8.1 12/21/2015   HGB 13.8 (A) 12/21/2015   PLT 219.0 08/09/2014   NA 140 01/15/2018   K 4.1 01/15/2018   CL 100 01/15/2018   CO2 25 01/15/2018   GLUCOSE 164 (H) 01/15/2018   BUN 33 (H) 01/15/2018   CREATININE 1.45 (H) 01/15/2018   BILITOT 1.2 07/18/2011   ALKPHOS 67 07/18/2011   AST 20 07/18/2011   ALT 27 07/18/2011   PROT 7.6 07/18/2011   ALBUMIN 4.1 07/18/2011   CALCIUM 9.6 01/15/2018   GFRAA 57 (L) 01/15/2018    Speciality Comments: No specialty comments available.  Procedures:  No procedures performed Allergies: Corticosteroids, Crestor [rosuvastatin calcium], and Simvastatin   Assessment / Plan:     Visit Diagnoses: Pain in both hands -he has DIP and PIP thickening in his hands consistent with osteoarthritis.  Plan: XR Hand 2 View Right, XR Hand 2 View Left.  X-ray of bilateral hands today  were consistent with osteoarthritis.  Joint protection muscle strengthening was discussed.  A handout on hand exercises was given.  Natural anti-inflammatories were discussed.  Primary osteoarthritis of both hands  Primary osteoarthritis of both feet-proper fitting shoes were discussed.  He also has some calluses.  DDD (degenerative disc disease), lumbar - With a spinal stenosis.  S/p laminectomy by Dr. Rolena Infante.  He still has intermittent left-sided radiculopathy.  Ganglion cyst  Type 2 diabetes mellitus with peripheral neuropathy (HCC)-patient states he has tried Lyrica in the past which gives him a lot of relief.  He has been taking ibuprofen 800 mg at bedtime to relieve pain symptoms.  Have discouraged the use of ibuprofen because his GFR is low.  We discussed different treatment options.  Have given him a prescription for gabapentin 100 mg 2 tablets p.o. nightly.  Side effects including dizziness, drowsiness and weight gain were discussed at length.  Have advised him to try 1 capsule at bedtime and once he tolerates that he can increase it to 2 pills at bedtime.  If he tolerates this and it works for him that he can get prescriptions in the future by his PCP.  OSA on CPAP  Aortic valve stenosis, etiology of cardiac valve disease unspecified  CHB (complete heart block) (HCC)  Essential hypertension-blood pressure is under control.  History of hypothyroidism  Orders: Orders Placed This Encounter  Procedures  . XR Hand 2 View Right  . XR Hand 2 View Left   Meds ordered this encounter  Medications  . gabapentin (NEURONTIN) 100 MG capsule    Sig: Take 2 capsules (200 mg total) by mouth at bedtime.    Dispense:  60 capsule    Refill:  2    Face-to-face time spent with patient was 45 minutes. Greater than 50% of time was spent in counseling and coordination of care.  Follow-Up Instructions: No follow-ups on file.   Bo Merino, MD  Note - This record has been created using  Editor, commissioning.  Chart creation errors have been sought, but may not always  have been located. Such creation errors do not reflect on  the standard of medical care.

## 2019-02-11 ENCOUNTER — Telehealth: Payer: Self-pay

## 2019-02-11 NOTE — Telephone Encounter (Signed)
Spoke with pt regarding appt on 02/13/19. Pt stated he would rather have a phone call at the time of his appt. Pt questions and concerns were address.

## 2019-02-13 ENCOUNTER — Telehealth (INDEPENDENT_AMBULATORY_CARE_PROVIDER_SITE_OTHER): Payer: Medicare Other | Admitting: Internal Medicine

## 2019-02-13 ENCOUNTER — Encounter: Payer: Self-pay | Admitting: Internal Medicine

## 2019-02-13 ENCOUNTER — Other Ambulatory Visit: Payer: Self-pay

## 2019-02-13 VITALS — BP 130/84 | HR 87 | Ht 72.0 in | Wt 232.0 lb

## 2019-02-13 DIAGNOSIS — I442 Atrioventricular block, complete: Secondary | ICD-10-CM | POA: Diagnosis not present

## 2019-02-13 DIAGNOSIS — I10 Essential (primary) hypertension: Secondary | ICD-10-CM

## 2019-02-13 DIAGNOSIS — G4733 Obstructive sleep apnea (adult) (pediatric): Secondary | ICD-10-CM | POA: Diagnosis not present

## 2019-02-13 DIAGNOSIS — Z9989 Dependence on other enabling machines and devices: Secondary | ICD-10-CM | POA: Diagnosis not present

## 2019-02-13 NOTE — Progress Notes (Signed)
Electrophysiology TeleHealth Note  Due to national recommendations of social distancing due to [place] 19, an audio telehealth visit is felt to be most appropriate for this patient at this time.  Verbal consent was obtained by me for the telehealth visit today.  The patient does not have capability for a virtual visit.  A phone visit is therefore required today.   Date:  02/13/2019   ID:  Tony [name], DOB [birth year], MRN [id number]  Location: patient's home  Provider location:  [facility]  Evaluation Performed: Follow-up visit  PCP:  Lajean Manes, MD   Electrophysiologist:  Dr [name]  Chief Complaint:  palpitations  History of Present Illness:    Tony [name] is a 68 y.o. male who presents via telehealth conferencing today.  Since last being seen in our clinic, the patient reports doing very well.  He is active.  his wife died about a month ago with complications related to a stroke from 3 years ago. Today, he denies symptoms of palpitations, chest pain, shortness of breath,  lower extremity edema, dizziness, presyncope, or syncope.  The patient is otherwise without complaint today.  The patient denies symptoms of fevers, chills, cough, or new SOB worrisome for COVID 19.  Past Medical History:  Diagnosis Date   Cancer [facility])    thyroid   Complete heart block ([place])    a. s/p MDT dual chamber PPM followed by Dr [name]    Complication of anesthesia    1985 after Thyriodectomy hard time waking up   Diabetes mellitus    GERD (gastroesophageal reflux disease)    Hypertension    Hypothyroidism    Had two surgeries for Cancer    Past Surgical History:  Procedure Laterality Date   LUMBAR LAMINECTOMY/DECOMPRESSION MICRODISCECTOMY  07/19/2011   Procedure: LUMBAR LAMINECTOMY/DECOMPRESSION MICRODISCECTOMY 3 LEVELS;  Surgeon: Melina Schools, MD;  Location: [facility];  Service: Orthopedics;  Laterality: Left;  Lumbar three-Lumbar five LEFT DECOMPRESSION AND FORAMINOTOMY  Lumbar three-four LEFT DISCECTOMY   PERMANENT PACEMAKER INSERTION N/A 08/11/2014   MDT Adapta L implanted by Dr [name] for CHB   Thyroidectomy x2      Current Outpatient Medications  Medication Sig Dispense Refill   acetaminophen (TYLENOL) 500 MG tablet Take 500 mg by mouth every 6 (six) hours as needed (pain).      amLODipine (NORVASC) 10 MG tablet Take 10 mg by mouth daily.      aspirin EC 81 MG tablet Take 81 mg by mouth daily.     atorvastatin (LIPITOR) 10 MG tablet Take 10 mg by mouth daily.     cyclobenzaprine (FLEXERIL) 10 MG tablet Take 10 mg by mouth 3 (three) times daily as needed. For muscle pain.     glipiZIDE (GLUCOTROL) 10 MG tablet Take 10 mg by mouth daily.      hydrochlorothiazide (HYDRODIURIL) 25 MG tablet Take 0.5 tablets (12.5 mg total) by mouth daily. 45 tablet 3   ibuprofen (ADVIL,MOTRIN) 800 MG tablet Take 800 mg by mouth every 8 (eight) hours as needed. For pain.     INVOKAMET XR [phone] MG TB24 Take 2 tablets by mouth daily.      irbesartan (AVAPRO) 150 MG tablet Take 1 tablet (150 mg total) by mouth daily. 90 tablet 1   levothyroxine (SYNTHROID, LEVOTHROID) 175 MCG tablet Take 175 mcg by mouth daily before breakfast. Alt with 1 and 1/2 tablets daily     mupirocin ointment (BACTROBAN) 2 % Apply 1 application topically daily.  omeprazole (PRILOSEC) 20 MG capsule Take 20 mg by mouth daily as needed. For acid reflux     sertraline (ZOLOFT) 100 MG tablet Take 100 mg by mouth daily.     Tamsulosin HCl (FLOMAX) 0.4 MG CAPS Take 1 capsule (0.4 mg total) by mouth every morning. 30 capsule 0   TOUJEO SOLOSTAR 300 UNIT/ML SOPN      metoprolol tartrate (LOPRESSOR) 25 MG tablet Take 0.5 tablets (12.5 mg total) by mouth 2 (two) times daily. 180 tablet 1   No current facility-administered medications for this visit.     Allergies:   Corticosteroids, Crestor [rosuvastatin calcium], and Simvastatin   Social History:  The patient  reports that he has never  smoked. He has never used smokeless tobacco. He reports that he does not drink alcohol or use drugs.   Family History:  The patient's family history includes Heart attack in his father; Heart disease in his father; Lung cancer in his mother.   ROS:  Please see the history of present illness.   All other systems are personally reviewed and negative.    Exam:    Vital Signs:  BP 130/84    Pulse 87    Ht 6' (1.829 m)    Wt 232 lb (105.2 kg)    BMI 31.46 kg/m   Well sounding, alert and conversant   Labs/Other Tests and Data Reviewed:    Recent Labs: No results found for requested labs within last 8760 hours.   Wt Readings from Last 3 Encounters:  02/13/19 232 lb (105.2 kg)  05/12/18 244 lb (110.7 kg)  02/03/18 242 lb 6.4 oz (110 kg)    Echo 09/10/2018- EF 50-55%, severe LVH   ASSESSMENT & PLAN:    1.  Complete heart block Overdue to have his device checked Not compliant with remotes.  He will look for his transmitter and will send [place] a transmission.  The importance of compliance was discussed today.  He is willing to be compliant going forward.  2. HTN He has mild swelling with norvasc  3. Overweight Lifestyle modification is encouraged  4. OSA Compliant with CPAP  Follow-up:  12 months with me if we can get him set up for remotes   Patient Risk:  after full review of this patients clinical status, I feel that they are at moderate risk at this time.  Today, I have spent 15 minutes with the patient with telehealth technology discussing arrhythmia management .    Army Fossa, MD  02/13/2019 11:29 AM     [address] [place] [address] [phone] (office) [phone] (fax)

## 2019-02-16 ENCOUNTER — Telehealth: Payer: Self-pay

## 2019-02-16 NOTE — Telephone Encounter (Signed)
-----   Message from [name], MD sent at 02/13/2019 11:39 AM EDT ----- He has not traditionally done remotes.  He thinks he has a transmitter but cannot find it.  He states that he will try to locate it this weekend.  If he cannot, then he would like a new one.  He says he will now comply with remotes.  Please call him on Monday to arrange remotes.  If we cant get him setup for remotes, he may require an in office device clinic visit.  I would prefer remotes though.Marland KitchenMarland Kitchen

## 2019-02-16 NOTE — Telephone Encounter (Signed)
I spoke with the pt and he states he can not find his monitor. I told the pt I will contact [employer] to order him a new monitor. The pt wants me to send his monitor to [address], [place] [address].

## 2019-02-16 NOTE — Telephone Encounter (Signed)
LMOVM for pt to call my direct office number. I was calling to follow up with the pt to see if he found his home monitor.

## 2019-02-18 NOTE — Telephone Encounter (Signed)
Monitor order date 02-17-2019

## 2019-02-24 ENCOUNTER — Encounter: Payer: Self-pay | Admitting: Rheumatology

## 2019-02-24 ENCOUNTER — Ambulatory Visit: Payer: Self-pay

## 2019-02-24 ENCOUNTER — Other Ambulatory Visit: Payer: Self-pay

## 2019-02-24 ENCOUNTER — Ambulatory Visit (INDEPENDENT_AMBULATORY_CARE_PROVIDER_SITE_OTHER): Payer: Medicare Other | Admitting: Rheumatology

## 2019-02-24 VITALS — BP 131/78 | HR 72 | Resp 15 | Ht 72.0 in | Wt 247.2 lb

## 2019-02-24 DIAGNOSIS — M19041 Primary osteoarthritis, right hand: Secondary | ICD-10-CM

## 2019-02-24 DIAGNOSIS — Z8639 Personal history of other endocrine, nutritional and metabolic disease: Secondary | ICD-10-CM

## 2019-02-24 DIAGNOSIS — M19071 Primary osteoarthritis, right ankle and foot: Secondary | ICD-10-CM

## 2019-02-24 DIAGNOSIS — M79641 Pain in right hand: Secondary | ICD-10-CM

## 2019-02-24 DIAGNOSIS — M79642 Pain in left hand: Secondary | ICD-10-CM | POA: Diagnosis not present

## 2019-02-24 DIAGNOSIS — E1142 Type 2 diabetes mellitus with diabetic polyneuropathy: Secondary | ICD-10-CM

## 2019-02-24 DIAGNOSIS — G4733 Obstructive sleep apnea (adult) (pediatric): Secondary | ICD-10-CM

## 2019-02-24 DIAGNOSIS — M5136 Other intervertebral disc degeneration, lumbar region: Secondary | ICD-10-CM | POA: Diagnosis not present

## 2019-02-24 DIAGNOSIS — I442 Atrioventricular block, complete: Secondary | ICD-10-CM

## 2019-02-24 DIAGNOSIS — M6740 Ganglion, unspecified site: Secondary | ICD-10-CM | POA: Diagnosis not present

## 2019-02-24 DIAGNOSIS — I10 Essential (primary) hypertension: Secondary | ICD-10-CM

## 2019-02-24 DIAGNOSIS — M19072 Primary osteoarthritis, left ankle and foot: Secondary | ICD-10-CM

## 2019-02-24 DIAGNOSIS — Z9989 Dependence on other enabling machines and devices: Secondary | ICD-10-CM

## 2019-02-24 DIAGNOSIS — I350 Nonrheumatic aortic (valve) stenosis: Secondary | ICD-10-CM

## 2019-02-24 DIAGNOSIS — M19042 Primary osteoarthritis, left hand: Secondary | ICD-10-CM

## 2019-02-24 MED ORDER — GABAPENTIN 100 MG PO CAPS: 200.0000 mg | ORAL_CAPSULE | Freq: Every day | ORAL | 2 refills | Status: DC

## 2019-02-24 NOTE — Patient Instructions (Signed)
Hand Exercises °Hand exercises can be helpful for almost anyone. These exercises can strengthen the hands, improve flexibility and movement, and increase blood flow to the hands. These results can make work and daily tasks easier. Hand exercises can be especially helpful for people who have joint pain from arthritis or have nerve damage from overuse (carpal tunnel syndrome). These exercises can also help people who have injured a hand. °Exercises °Most of these hand exercises are gentle stretching and motion exercises. It is usually safe to do them often throughout the day. Warming up your hands before exercise may help to reduce stiffness. You can do this with gentle massage or by placing your hands in warm water for 10-15 minutes. °It is normal to feel some stretching, pulling, tightness, or mild discomfort as you begin new exercises. This will gradually improve. Stop an exercise right away if you feel sudden, severe pain or your pain gets worse. Ask your health care provider which exercises are best for you. °Knuckle bend or "claw" fist °1. Stand or sit with your arm, hand, and all five fingers pointed straight up. Make sure to keep your wrist straight during the exercise. °2. Gently bend your fingers down toward your palm until the tips of your fingers are touching the top of your palm. Keep your big knuckle straight and just bend the small knuckles in your fingers. °3. Hold this position for __________ seconds. °4. Straighten (extend) your fingers back to the starting position. °Repeat this exercise 5-10 times with each hand. °Full finger fist °1. Stand or sit with your arm, hand, and all five fingers pointed straight up. Make sure to keep your wrist straight during the exercise. °2. Gently bend your fingers into your palm until the tips of your fingers are touching the middle of your palm. °3. Hold this position for __________ seconds. °4. Extend your fingers back to the starting position, stretching every  joint fully. °Repeat this exercise 5-10 times with each hand. °Straight fist °1. Stand or sit with your arm, hand, and all five fingers pointed straight up. Make sure to keep your wrist straight during the exercise. °2. Gently bend your fingers at the big knuckle, where your fingers meet your hand, and the middle knuckle. Keep the knuckle at the tips of your fingers straight and try to touch the bottom of your palm. °3. Hold this position for __________ seconds. °4. Extend your fingers back to the starting position, stretching every joint fully. °Repeat this exercise 5-10 times with each hand. °Tabletop °1. Stand or sit with your arm, hand, and all five fingers pointed straight up. Make sure to keep your wrist straight during the exercise. °2. Gently bend your fingers at the big knuckle, where your fingers meet your hand, as far down as you can while keeping the small knuckles in your fingers straight. Think of forming a tabletop with your fingers. °3. Hold this position for __________ seconds. °4. Extend your fingers back to the starting position, stretching every joint fully. °Repeat this exercise 5-10 times with each hand. °Finger spread °1. Place your hand flat on a table with your palm facing down. Make sure your wrist stays straight as you do this exercise. °2. Spread your fingers and thumb apart from each other as far as you can until you feel a gentle stretch. Hold this position for __________ seconds. °3. Bring your fingers and thumb tight together again. Hold this position for __________ seconds. °Repeat this exercise 5-10 times with each hand. °  Making circles °1. Stand or sit with your arm, hand, and all five fingers pointed straight up. Make sure to keep your wrist straight during the exercise. °2. Make a circle by touching the tip of your thumb to the tip of your index finger. °3. Hold for __________ seconds. Then open your hand wide. °4. Repeat this motion with your thumb and each finger on your  hand. °Repeat this exercise 5-10 times with each hand. °Thumb motion °1. Sit with your forearm resting on a table and your wrist straight. Your thumb should be facing up toward the ceiling. Keep your fingers relaxed as you move your thumb. °2. Lift your thumb up as high as you can toward the ceiling. Hold for __________ seconds. °3. Bend your thumb across your palm as far as you can, reaching the tip of your thumb for the small finger (pinkie) side of your palm. Hold for __________ seconds. °Repeat this exercise 5-10 times with each hand. °Grip strengthening ° °1. Hold a stress ball or other soft ball in the middle of your hand. °2. Slowly increase the pressure, squeezing the ball as much as you can without causing pain. Think of bringing the tips of your fingers into the middle of your palm. All of your finger joints should bend when doing this exercise. °3. Hold your squeeze for __________ seconds, then relax. °Repeat this exercise 5-10 times with each hand. °Contact a health care provider if: °· Your hand pain or discomfort gets much worse when you do an exercise. °· Your hand pain or discomfort does not improve within 2 hours after you exercise. °If you have any of these problems, stop doing these exercises right away. Do not do them again unless your health care provider says that you can. °Get help right away if: °· You develop sudden, severe hand pain or swelling. If this happens, stop doing these exercises right away. Do not do them again unless your health care provider says that you can. °This information is not intended to replace advice given to you by your health care provider. Make sure you discuss any questions you have with your health care provider. °Document Released: 03/28/2015 Document Revised: 08/07/2018 Document Reviewed: 04/17/2018 °Elsevier Patient Education © 2020 [employer] Inc. ° °

## 2019-02-27 ENCOUNTER — Ambulatory Visit (INDEPENDENT_AMBULATORY_CARE_PROVIDER_SITE_OTHER): Payer: Medicare Other | Admitting: *Deleted

## 2019-02-27 DIAGNOSIS — I442 Atrioventricular block, complete: Secondary | ICD-10-CM

## 2019-02-27 NOTE — Telephone Encounter (Signed)
I spoke with the pt to follow up to see if he received his new monitor. He states he did indeed received his new monitor and will send a transmission when he get back in town this weekend.

## 2019-03-01 LAB — CUP PACEART REMOTE DEVICE CHECK
Battery Impedance: 302 Ohm
Battery Remaining Longevity: 98 mo
Battery Voltage: 2.78 V
Brady Statistic AP VP Percent: 18 %
Brady Statistic AP VS Percent: 0 %
Brady Statistic AS VP Percent: 81 %
Brady Statistic AS VS Percent: 1 %
Date Time Interrogation Session: 20201101172200
Implantable Lead Implant Date: 20160413
Implantable Lead Implant Date: 20160413
Implantable Lead Location: 753859
Implantable Lead Location: 753860
Implantable Lead Model: 5076
Implantable Lead Model: 5076
Implantable Pulse Generator Implant Date: 20160413
Lead Channel Impedance Value: 436 Ohm
Lead Channel Impedance Value: 521 Ohm
Lead Channel Pacing Threshold Amplitude: 0.5 V
Lead Channel Pacing Threshold Amplitude: 0.625 V
Lead Channel Pacing Threshold Pulse Width: 0.4 ms
Lead Channel Pacing Threshold Pulse Width: 0.4 ms
Lead Channel Setting Pacing Amplitude: 2 V
Lead Channel Setting Pacing Amplitude: 2.5 V
Lead Channel Setting Pacing Pulse Width: 0.4 ms
Lead Channel Setting Sensing Sensitivity: 4 mV

## 2019-03-02 ENCOUNTER — Ambulatory Visit (INDEPENDENT_AMBULATORY_CARE_PROVIDER_SITE_OTHER): Payer: Medicare Other | Admitting: *Deleted

## 2019-03-02 DIAGNOSIS — I442 Atrioventricular block, complete: Secondary | ICD-10-CM

## 2019-03-02 NOTE — Telephone Encounter (Signed)
Transmission received and I scheduled him. He is now on a 91 day schedule.

## 2019-03-06 ENCOUNTER — Other Ambulatory Visit: Payer: Self-pay

## 2019-03-06 DIAGNOSIS — Z20822 Contact with and (suspected) exposure to covid-19: Secondary | ICD-10-CM

## 2019-03-07 LAB — NOVEL CORONAVIRUS, NAA: SARS-CoV-2, NAA: NOT DETECTED

## 2019-03-09 ENCOUNTER — Telehealth: Payer: Self-pay | Admitting: Emergency Medicine

## 2019-03-09 NOTE — Telephone Encounter (Signed)
Transmission received and LMOM per DPR that transmission received.

## 2019-03-11 NOTE — Progress Notes (Signed)
Remote pacemaker transmission.   

## 2019-03-24 NOTE — Progress Notes (Signed)
Remote pacemaker transmission.   

## 2019-04-09 ENCOUNTER — Other Ambulatory Visit: Payer: Self-pay

## 2019-04-09 ENCOUNTER — Encounter: Payer: Self-pay | Admitting: Orthopaedic Surgery

## 2019-04-09 ENCOUNTER — Ambulatory Visit (INDEPENDENT_AMBULATORY_CARE_PROVIDER_SITE_OTHER): Payer: Medicare Other | Admitting: Orthopaedic Surgery

## 2019-04-09 ENCOUNTER — Ambulatory Visit: Payer: Self-pay

## 2019-04-09 VITALS — Ht 72.0 in | Wt 240.0 lb

## 2019-04-09 DIAGNOSIS — M79672 Pain in left foot: Secondary | ICD-10-CM | POA: Insufficient documentation

## 2019-04-09 NOTE — Progress Notes (Signed)
Office Visit Note   Patient: Tony [name]           Date of Birth: 27-Dec-1950           MRN: [id number] Visit Date: 04/09/2019              Requested by: Lajean Manes, MD [address] [place],  [place] [address] PCP: Lajean Manes, MD   Assessment & Plan: Visit Diagnoses:  1. Pain in left foot     Plan: Several week history of left foot pain localized to the second or third metatarsal phalangeal joint.  No injury or trauma.  No fever chills or redness.  Just mild swelling.  X-rays were negative.  Could be that there is an inflammation of the second metatarsal phalangeal joint or early stress fracture that is not visualized by film.  We will plan on wearing a good comfortable shoe and monitor his response.  If still having trouble over the next several weeks would repeat the films.  No evidence of fracture or infection  Follow-Up Instructions: No follow-ups on file.   Orders:  Orders Placed This Encounter  Procedures  . XR Foot Complete Left   No orders of the defined types were placed in this encounter.     Procedures: No procedures performed   Clinical Data: No additional findings.   Subjective: Chief Complaint  Patient presents with  . Left Foot - Pain  Patient presents today for left foot pain. No known injury. He said that he has pain behind his second and third toes, along with swelling. He feels like he may have irritated his foot while trying to step on a toolbox to get some [employer]. He is taking Ibuprofen as needed. His pain is present with walking.  Then is diabetic and is careful about checking the skin in both of his feet.  He has had a recent fracture at the base of the fifth metatarsal that appears to have healed that is not symptomatic and does have callus by x-ray  HPI  Review of Systems   Objective: Vital Signs: Ht 6' (1.829 m)   Wt 240 lb (108.9 kg)   BMI 32.55 kg/m   Physical Exam Constitutional:    Appearance: He is well-developed.  Eyes:     Pupils: Pupils are equal, round, and reactive to light.  Pulmonary:     Effort: Pulmonary effort is normal.  Skin:    General: Skin is warm and dry.  Neurological:     Mental Status: He is alert and oriented to person, place, and time.  Psychiatric:        Behavior: Behavior normal.     Ortho Exam left foot with hallux valgus.  No pain over the bunion.  Feet are warm with good capillary refill.  Some calluses around the great toe but no redness or open areas or drainage.  Good sensation.  No pain at the base of the fifth metatarsal.  Mild swelling over the distal second and third metatarsal heads with some pain on motion of the second toe at the level of the metatarsal phalangeal joint.  No red streaking.  No plantar pain.  Specialty Comments:  No specialty comments available.  Imaging: XR Foot Complete Left  Result Date: 04/09/2019 Films of the left foot were obtained in several projections.  There is a hallux valgus and bipartite sesamoid.  An old fracture at the base of the fifth metatarsal with healing and abundant  callus.  Present pain is localized near the second and third metatarsal phalangeal joint or distal metatarsal.  No obvious changes identified by film.  No evidence of fracture or osteomyelitis.  No vascular calcification.  Large posterior and plantar heel spur some calcification in the distal Achilles tendon.  Midfoot changes of osteoarthritis with anterior osteophyte formation at the talus and talonavicular articulation.    PMFS History: Patient Active Problem List   Diagnosis Date Noted  . Pain in left foot 04/09/2019  . Toe infection 06/10/2017  . Ganglion cyst 12/19/2015  . Aortic stenosis 09/01/2014  . Essential hypertension 09/01/2014  . Type 2 diabetes mellitus with peripheral neuropathy ([place]) 09/01/2014  . Hypothyroidism 09/01/2014  . OSA on CPAP 09/01/2014  . CHB (complete heart block) ([place]) 08/11/2014  .  Complete heart block ([place]) 08/05/2014  . Lumbar stenosis 07/16/2011  . HNP (herniated nucleus pulposus), lumbar 07/16/2011   Past Medical History:  Diagnosis Date  . Cancer ([place])    thyroid  . Complete heart block ([facility])    a. s/p MDT dual chamber PPM followed by Dr [name]   . Complication of anesthesia    1985 after Thyriodectomy hard time waking up  . Diabetes mellitus   . GERD (gastroesophageal reflux disease)   . Hypertension   . Hypothyroidism    Had two surgeries for Cancer    Family History  Problem Relation Age of Onset  . Lung cancer Mother   . Heart disease Father   . Heart attack Father   . Healthy Sister   . Healthy Daughter   . Healthy Son   . Anesthesia problems Neg Hx   . Hypotension Neg Hx   . Pseudochol deficiency Neg Hx     Past Surgical History:  Procedure Laterality Date  . LUMBAR LAMINECTOMY/DECOMPRESSION MICRODISCECTOMY  07/19/2011   Procedure: LUMBAR LAMINECTOMY/DECOMPRESSION MICRODISCECTOMY 3 LEVELS;  Surgeon: Melina Schools, MD;  Location: [facility];  Service: Orthopedics;  Laterality: Left;  Lumbar three-Lumbar five LEFT DECOMPRESSION AND FORAMINOTOMY Lumbar three-four LEFT DISCECTOMY  . PERMANENT PACEMAKER INSERTION N/A 08/11/2014   MDT Adapta L implanted by Dr [name] for CHB  . Thyroidectomy x2     Social History   Occupational History  . Occupation: retired  Tobacco Use  . Smoking status: Never Smoker  . Smokeless tobacco: Never Used  Substance and Sexual Activity  . Alcohol use: No    Alcohol/week: 0.0 standard drinks  . Drug use: No  . Sexual activity: Not on file

## 2019-05-29 ENCOUNTER — Other Ambulatory Visit: Payer: Self-pay | Admitting: *Deleted

## 2019-06-01 ENCOUNTER — Ambulatory Visit (INDEPENDENT_AMBULATORY_CARE_PROVIDER_SITE_OTHER): Payer: Medicare PPO | Admitting: *Deleted

## 2019-06-01 DIAGNOSIS — I442 Atrioventricular block, complete: Secondary | ICD-10-CM

## 2019-06-03 ENCOUNTER — Telehealth: Payer: Self-pay

## 2019-06-03 LAB — CUP PACEART REMOTE DEVICE CHECK
Battery Impedance: 326 Ohm
Battery Remaining Longevity: 95 mo
Battery Voltage: 2.78 V
Brady Statistic AP VP Percent: 17 %
Brady Statistic AP VS Percent: 0 %
Brady Statistic AS VP Percent: 82 %
Brady Statistic AS VS Percent: 1 %
Date Time Interrogation Session: 20210203000808
Implantable Lead Implant Date: 20160413
Implantable Lead Implant Date: 20160413
Implantable Lead Location: 753859
Implantable Lead Location: 753860
Implantable Lead Model: 5076
Implantable Lead Model: 5076
Implantable Pulse Generator Implant Date: 20160413
Lead Channel Impedance Value: 403 Ohm
Lead Channel Impedance Value: 521 Ohm
Lead Channel Pacing Threshold Amplitude: 0.5 V
Lead Channel Pacing Threshold Amplitude: 0.625 V
Lead Channel Pacing Threshold Pulse Width: 0.4 ms
Lead Channel Pacing Threshold Pulse Width: 0.4 ms
Lead Channel Setting Pacing Amplitude: 2 V
Lead Channel Setting Pacing Amplitude: 2.5 V
Lead Channel Setting Pacing Pulse Width: 0.4 ms
Lead Channel Setting Sensing Sensitivity: 4 mV

## 2019-06-03 NOTE — Telephone Encounter (Signed)
Spoke with patient to remind of missed remote transmission 

## 2019-06-21 ENCOUNTER — Ambulatory Visit: Payer: Medicare PPO | Attending: Internal Medicine

## 2019-06-21 DIAGNOSIS — Z23 Encounter for immunization: Secondary | ICD-10-CM | POA: Insufficient documentation

## 2019-06-21 NOTE — Progress Notes (Signed)
   Covid-19 Vaccination Clinic  Name:  Tony [name]    MRN: [id number] DOB: 08/05/1950  06/21/2019  [name] was observed post Covid-19 immunization for 15 minutes without incidence. He was provided with Vaccine Information Sheet and instruction to access the V-Safe system.   [name] was instructed to call 911 with any severe reactions post vaccine: Marland Kitchen Difficulty breathing  . Swelling of your face and throat  . A fast heartbeat  . A bad rash all over your body  . Dizziness and weakness    Immunizations Administered    Name Date Dose VIS Date Route   Pfizer COVID-19 Vaccine 06/21/2019  8:39 AM 0.3 mL 04/10/2019 Intramuscular   Manufacturer: [place]   Lot: [id number]   [place]: [id number]

## 2019-06-30 ENCOUNTER — Telehealth: Payer: Self-pay | Admitting: Rheumatology

## 2019-06-30 NOTE — Telephone Encounter (Signed)
Patient request refill on Gabapentin 100 mg sent to [facility] on [address].

## 2019-07-01 NOTE — Telephone Encounter (Signed)
Patient advised per last office note if he was tolerating the Gabapentin and it works well for him he can get future refills from his PCP. Patient states he will contact his PCP.

## 2019-07-15 ENCOUNTER — Ambulatory Visit: Payer: Medicare PPO | Attending: Internal Medicine

## 2019-07-15 DIAGNOSIS — Z23 Encounter for immunization: Secondary | ICD-10-CM

## 2019-07-15 NOTE — Progress Notes (Signed)
   Covid-19 Vaccination Clinic  Name:  Tony [name]    MRN: [id number] DOB: 12/16/1950  07/15/2019  [name] was observed post Covid-19 immunization for 15 minutes without incident. He was provided with Vaccine Information Sheet and instruction to access the V-Safe system.   [name] was instructed to call 911 with any severe reactions post vaccine: Marland Kitchen Difficulty breathing  . Swelling of face and throat  . A fast heartbeat  . A bad rash all over body  . Dizziness and weakness   Immunizations Administered    Name Date Dose VIS Date Route   Pfizer COVID-19 Vaccine 07/15/2019  8:35 AM 0.3 mL 04/10/2019 Intramuscular   Manufacturer: [place]   Lot: [id number]   [place]: [id number]

## 2019-08-31 ENCOUNTER — Ambulatory Visit (INDEPENDENT_AMBULATORY_CARE_PROVIDER_SITE_OTHER): Payer: Medicare PPO | Admitting: *Deleted

## 2019-08-31 DIAGNOSIS — I442 Atrioventricular block, complete: Secondary | ICD-10-CM

## 2019-09-01 ENCOUNTER — Telehealth: Payer: Self-pay

## 2019-09-01 NOTE — Telephone Encounter (Signed)
Spoke with patient to remind of missed remote transmission 

## 2019-09-02 ENCOUNTER — Telehealth: Payer: Self-pay

## 2019-09-02 NOTE — Telephone Encounter (Signed)
Pt states he moved and lost the power cord. I gave him the number to tech support to get a power cord sent to him.

## 2019-09-04 LAB — CUP PACEART REMOTE DEVICE CHECK
Battery Impedance: 375 Ohm
Battery Remaining Longevity: 92 mo
Battery Voltage: 2.78 V
Brady Statistic AP VP Percent: 16 %
Brady Statistic AP VS Percent: 0 %
Brady Statistic AS VP Percent: 83 %
Brady Statistic AS VS Percent: 1 %
Date Time Interrogation Session: 20210507123704
Implantable Lead Implant Date: 20160413
Implantable Lead Implant Date: 20160413
Implantable Lead Location: 753859
Implantable Lead Location: 753860
Implantable Lead Model: 5076
Implantable Lead Model: 5076
Implantable Pulse Generator Implant Date: 20160413
Lead Channel Impedance Value: 442 Ohm
Lead Channel Impedance Value: 553 Ohm
Lead Channel Pacing Threshold Amplitude: 0.5 V
Lead Channel Pacing Threshold Amplitude: 0.625 V
Lead Channel Pacing Threshold Pulse Width: 0.4 ms
Lead Channel Pacing Threshold Pulse Width: 0.4 ms
Lead Channel Setting Pacing Amplitude: 2 V
Lead Channel Setting Pacing Amplitude: 2.5 V
Lead Channel Setting Pacing Pulse Width: 0.4 ms
Lead Channel Setting Sensing Sensitivity: 4 mV

## 2019-09-07 NOTE — Progress Notes (Signed)
Remote pacemaker transmission.   

## 2019-09-17 ENCOUNTER — Telehealth: Payer: Self-pay | Admitting: *Deleted

## 2019-09-17 NOTE — Telephone Encounter (Signed)
Patient called, informed patient his manual transmission was reviewed today and does not show any abnormal findings related to concern today. Patient offered apt. With A. Tillery, PA-C, scheduled 09/18/19 at 8:25 AM. Advised to call DC back if the has any further questions of concerns. Verbalizes understanding.

## 2019-09-17 NOTE — Telephone Encounter (Signed)
Transferred call to [facility]

## 2019-09-17 NOTE — Telephone Encounter (Signed)
Follow Up:    Pt says he needs to talked to sElizabeth earlier, says he needs to talk to somebody again please. Pt says he thinks he needs to be seen today.

## 2019-09-17 NOTE — Telephone Encounter (Signed)
Returned patients call and informed him that we unfortunately do not have any openings today to be seen. Offered to send message to [facility] triage to see if his Cardiology office (Dr. Evette Georges) office has any openings today to be seen. Patient agreeable. Also advised patient that if he feels he needs to be seen today he can always to the emergency department for evaluation. Patient verbalizes understanding.  Informed patient to call DC back [phone] if he has not herd from anyone by 1 PM today. Verbalizes understanding and patient agrees.

## 2019-09-17 NOTE — Telephone Encounter (Signed)
Patient called in to report "flutters" when climbing the stairs or walking on a hill. Only notices this sensation with activity. States "I wouldn't call it shortness of breath." Denies associated chest discomfort. Pulse regular. Symptoms have been going on for about a week. Pt agrees to send PPM transmission via home monitor. Will call back once received and reviewed.

## 2019-09-17 NOTE — Progress Notes (Signed)
Electrophysiology Office Note Date: 09/18/2019  ID:  Tony [name], DOB 1950-07-13, MRN [id number]  PCP: Lajean Manes, MD Primary Cardiologist: [name], MD Electrophysiologist: [name], MD   CC: Pacemaker follow-up  Tony [name] is a 69 y.o. male seen today for [name], MD for acute visit due to dyspnea on exertion.  Since last being seen in our clinic the patient reports doing OK. His wife passed away in 2023-02-17. For the past three weeks he has had a gradual increase in dyspnea with exertion. It remains mild, and he is able to carry on a conversation when it occurs. Most prevalent when walking up an incline. At times accompanied by cold sweats and heaviness in bilateral upper arms. He has known "at least moderate" aortic stenosis. No overt chest pain. Symptoms resolve within a few minutes of rest. He denies palpitations, PND, orthopnea, nausea, vomiting, dizziness, syncope, or near syncope.   Device History: Medtronic Dual Chamber PPM implanted 07/2014 for CHB  Past Medical History:  Diagnosis Date  . Cancer ([place])    thyroid  . Complete heart block (H[facility])    a. s/p MDT dual chamber PPM followed by Dr [name]   . Complication of anesthesia    1985 after Thyriodectomy hard time waking up  . Diabetes mellitus   . GERD (gastroesophageal reflux disease)   . Hypertension   . Hypothyroidism    Had two surgeries for Cancer   Past Surgical History:  Procedure Laterality Date  . LUMBAR LAMINECTOMY/DECOMPRESSION MICRODISCECTOMY  07/19/2011   Procedure: LUMBAR LAMINECTOMY/DECOMPRESSION MICRODISCECTOMY 3 LEVELS;  Surgeon: Melina Schools, MD;  Location: [facility];  Service: Orthopedics;  Laterality: Left;  Lumbar three-Lumbar five LEFT DECOMPRESSION AND FORAMINOTOMY Lumbar three-four LEFT DISCECTOMY  . PERMANENT PACEMAKER INSERTION N/A 08/11/2014   MDT Adapta L implanted by Dr [name] for CHB  . Thyroidectomy x2      Current Outpatient Medications  Medication Sig Dispense  Refill  . acetaminophen (TYLENOL) 500 MG tablet Take 500 mg by mouth every 6 (six) hours as needed (pain).     Marland Kitchen amLODipine (NORVASC) 10 MG tablet Take 10 mg by mouth daily.     Marland Kitchen aspirin EC 81 MG tablet Take 81 mg by mouth daily.    Marland Kitchen atorvastatin (LIPITOR) 10 MG tablet Take 10 mg by mouth daily.    . Cholecalciferol (VITAMIN D3) 25 MCG (1000 UT) CAPS Take by mouth daily.    . cyclobenzaprine (FLEXERIL) 10 MG tablet Take 10 mg by mouth 3 (three) times daily as needed. For muscle pain.    Marland Kitchen gabapentin (NEURONTIN) 100 MG capsule Take 2 capsules (200 mg total) by mouth at bedtime. 60 capsule 2  . glipiZIDE (GLUCOTROL) 10 MG tablet Take 10 mg by mouth daily.     . hydrochlorothiazide (HYDRODIURIL) 25 MG tablet Take 0.5 tablets (12.5 mg total) by mouth daily. 45 tablet 3  . ibuprofen (ADVIL,MOTRIN) 800 MG tablet Take 800 mg by mouth every 8 (eight) hours as needed. For pain.    . INVOKAMET XR [phone] MG TB24 Take 2 tablets by mouth daily.     Marland Kitchen levothyroxine (SYNTHROID, LEVOTHROID) 175 MCG tablet Take 175 mcg by mouth daily before breakfast.     . Multiple Vitamins-Minerals (SENIOR MULTIVITAMIN PLUS PO) Take by mouth daily.    . mupirocin ointment (BACTROBAN) 2 % Apply 1 application topically as needed.     Marland Kitchen omeprazole (PRILOSEC) 20 MG capsule Take 20 mg by mouth daily as needed. For  acid reflux    . sertraline (ZOLOFT) 100 MG tablet Take 200 mg by mouth daily.     . Tamsulosin HCl (FLOMAX) 0.4 MG CAPS Take 1 capsule (0.4 mg total) by mouth every morning. 30 capsule 0  . TRULICITY [id number] SOPN     . Vitamin D, Ergocalciferol, (DRISDOL) 1.25 MG (50000 UNIT) CAPS capsule      No current facility-administered medications for this visit.    Allergies:   Corticosteroids, Crestor [rosuvastatin calcium], and Simvastatin   Social History: Social History   Socioeconomic History  . Marital status: Married    Spouse name: jody  . Number of children: 2  . Years of education: college  .  Highest education level: Not on file  Occupational History  . Occupation: retired  Tobacco Use  . Smoking status: Never Smoker  . Smokeless tobacco: Never Used  Substance and Sexual Activity  . Alcohol use: No    Alcohol/week: 0.0 standard drinks  . Drug use: No  . Sexual activity: Not on file  Other Topics Concern  . Not on file  Social History Narrative  . Not on file   Social Determinants of Health   Financial Resource Strain:   . Difficulty of Paying Living Expenses:   Food Insecurity:   . Worried About Charity fundraiser in the Last Year:   . Arboriculturist in the Last Year:   Transportation Needs:   . Film/video editor (Medical):   Marland Kitchen Lack of Transportation (Non-Medical):   Physical Activity:   . Days of Exercise per Week:   . Minutes of Exercise per Session:   Stress:   . Feeling of Stress :   Social Connections:   . Frequency of Communication with Friends and Family:   . Frequency of Social Gatherings with Friends and Family:   . Attends Religious Services:   . Active Member of Clubs or Organizations:   . Attends Archivist Meetings:   Marland Kitchen Marital Status:   Intimate Partner Violence:   . Fear of Current or Ex-Partner:   . Emotionally Abused:   Marland Kitchen Physically Abused:   . Sexually Abused:     Family History: Family History  Problem Relation Age of Onset  . Lung cancer Mother   . Heart disease Father   . Heart attack Father   . Healthy Sister   . Healthy Daughter   . Healthy Son   . Anesthesia problems Neg Hx   . Hypotension Neg Hx   . Pseudochol deficiency Neg Hx      Review of Systems: All other systems reviewed and are otherwise negative except as noted above.  Physical Exam: Vitals:   09/18/19 0832  BP: 134/82  Pulse: 82  SpO2: 96%  Weight: 240 lb (108.9 kg)  Height: 6' (1.829 m)     GEN- The patient is well appearing, alert and oriented x 3 today.   HEENT: normocephalic, atraumatic; sclera clear, conjunctiva pink; hearing  intact; oropharynx clear; neck supple  Lungs- Clear to ausculation bilaterally, normal work of breathing.  No wheezes, rales, rhonchi Heart- Regular rate and rhythm, no murmurs, rubs or gallops  GI- soft, non-tender, non-distended, bowel sounds present  Extremities- no clubbing, cyanosis, or edema  MS- no significant deformity or atrophy Skin- warm and dry, no rash or lesion; PPM pocket well healed Psych- euthymic mood, full affect Neuro- strength and sensation are intact  PPM Interrogation- reviewed in detail today,  See PACEART report  EKG:  EKG is ordered today. The ekg ordered today shows A sensed V paced at 72 bpm, QRS 198 ms  Recent Labs: No results found for requested labs within last 8760 hours.   Wt Readings from Last 3 Encounters:  09/18/19 240 lb (108.9 kg)  04/09/19 240 lb (108.9 kg)  02/24/19 247 lb 3.2 oz (112.1 kg)     Other studies Reviewed: Additional studies/ records that were reviewed today include: Previous EP office notes, Previous remote checks, Most recent labwork.   Assessment and Plan:  1. CHB s/p Medtronic PPM  Normal PPM function See Pace Art report No changes today  2. HTN Mild swelling with norvasc Continue current medications  3. Overweight Body mass index is 32.55 kg/m.   4. OSA  Reports compliance with CPAP  5. Dyspnea on exertion Gradual increase over past several week. DDx include worsening aortic stenosis, pacemaker syndrome, or coronary artery disease. Discussed case with Dr. Burt Knack as below.  Will re-assess EF with Echo, and then determine further course depending on status of Aortic valve.  Labs today.   6. Moderate aortic stenosis Reviewed case briefly with Dr. Burt Knack in office today. Recommend repeat echo prior to any ischemic work up. If AS worse, may require L/RHC to further evaluate pressures and coronary anatomy.   Current medicines are reviewed at length with the patient today.   The patient does not have concerns  regarding his medicines.  The following changes were made today:  none  Labs/ tests ordered today include:  Orders Placed This Encounter  Procedures  . EKG 12-Lead   Disposition:   Follow up timing pending Echo. Stress test/cath based on those results, with follow up in 1-2 weeks after.   Jacalyn Lefevre, PA-C  09/18/2019 8:40 AM  [facility] [address] [place] [address] [phone] (office) [phone] (fax)

## 2019-09-17 NOTE — Telephone Encounter (Signed)
Pt called back with concerns of a different type of sensation walked up and down deck steps and had to take a seat for a bit to "calm down" SOB type feeling Instructed if needs to be evaluated today then needs to go to ED or if has increase in S/S Per pt will go to ED if has any changes other wise pt will keep appt tom ./cy

## 2019-09-18 ENCOUNTER — Ambulatory Visit: Payer: Medicare PPO | Admitting: Student

## 2019-09-18 ENCOUNTER — Other Ambulatory Visit: Payer: Self-pay

## 2019-09-18 ENCOUNTER — Telehealth: Payer: Self-pay

## 2019-09-18 ENCOUNTER — Encounter: Payer: Self-pay | Admitting: Student

## 2019-09-18 VITALS — BP 134/82 | HR 82 | Ht 72.0 in | Wt 240.0 lb

## 2019-09-18 DIAGNOSIS — Z950 Presence of cardiac pacemaker: Secondary | ICD-10-CM | POA: Diagnosis not present

## 2019-09-18 DIAGNOSIS — I350 Nonrheumatic aortic (valve) stenosis: Secondary | ICD-10-CM

## 2019-09-18 DIAGNOSIS — R0609 Other forms of dyspnea: Secondary | ICD-10-CM | POA: Insufficient documentation

## 2019-09-18 DIAGNOSIS — E1142 Type 2 diabetes mellitus with diabetic polyneuropathy: Secondary | ICD-10-CM

## 2019-09-18 DIAGNOSIS — I442 Atrioventricular block, complete: Secondary | ICD-10-CM

## 2019-09-18 DIAGNOSIS — Z79899 Other long term (current) drug therapy: Secondary | ICD-10-CM

## 2019-09-18 DIAGNOSIS — E876 Hypokalemia: Secondary | ICD-10-CM

## 2019-09-18 DIAGNOSIS — R0600 Dyspnea, unspecified: Secondary | ICD-10-CM | POA: Diagnosis not present

## 2019-09-18 DIAGNOSIS — I10 Essential (primary) hypertension: Secondary | ICD-10-CM

## 2019-09-18 LAB — CBC
Hematocrit: 44.1 % (ref 37.5–51.0)
Hemoglobin: 14.2 g/dL (ref 13.0–17.7)
MCH: 28.9 pg (ref 26.6–33.0)
MCHC: 32.2 g/dL (ref 31.5–35.7)
MCV: 90 fL (ref 79–97)
Platelets: 253 10*3/uL (ref 150–450)
RBC: 4.91 x10E6/uL (ref 4.14–5.80)
RDW: 14.2 % (ref 11.6–15.4)
WBC: 8.8 10*3/uL (ref 3.4–10.8)

## 2019-09-18 LAB — CUP PACEART INCLINIC DEVICE CHECK
Battery Impedance: 375 Ohm
Battery Remaining Longevity: 92 mo
Battery Voltage: 2.78 V
Brady Statistic AP VP Percent: 16 %
Brady Statistic AP VS Percent: 0 %
Brady Statistic AS VP Percent: 83 %
Brady Statistic AS VS Percent: 1 %
Date Time Interrogation Session: 20210521091237
Implantable Lead Implant Date: 20160413
Implantable Lead Implant Date: 20160413
Implantable Lead Location: 753859
Implantable Lead Location: 753860
Implantable Lead Model: 5076
Implantable Lead Model: 5076
Implantable Pulse Generator Implant Date: 20160413
Lead Channel Impedance Value: 436 Ohm
Lead Channel Impedance Value: 556 Ohm
Lead Channel Pacing Threshold Amplitude: 0.5 V
Lead Channel Pacing Threshold Amplitude: 0.5 V
Lead Channel Pacing Threshold Amplitude: 0.5 V
Lead Channel Pacing Threshold Amplitude: 0.625 V
Lead Channel Pacing Threshold Pulse Width: 0.4 ms
Lead Channel Pacing Threshold Pulse Width: 0.4 ms
Lead Channel Pacing Threshold Pulse Width: 0.4 ms
Lead Channel Pacing Threshold Pulse Width: 0.4 ms
Lead Channel Sensing Intrinsic Amplitude: 4 mV
Lead Channel Setting Pacing Amplitude: 2 V
Lead Channel Setting Pacing Amplitude: 2.5 V
Lead Channel Setting Pacing Pulse Width: 0.4 ms
Lead Channel Setting Sensing Sensitivity: 4 mV

## 2019-09-18 LAB — MAGNESIUM: Magnesium: 1.7 mg/dL (ref 1.6–2.3)

## 2019-09-18 LAB — COMPREHENSIVE METABOLIC PANEL
ALT: 18 IU/L (ref 0–44)
AST: 16 IU/L (ref 0–40)
Albumin/Globulin Ratio: 1.6 (ref 1.2–2.2)
Albumin: 4.5 g/dL (ref 3.8–4.8)
Alkaline Phosphatase: 79 IU/L (ref 48–121)
BUN/Creatinine Ratio: 18 (ref 10–24)
BUN: 23 mg/dL (ref 8–27)
Bilirubin Total: 0.7 mg/dL (ref 0.0–1.2)
CO2: 25 mmol/L (ref 20–29)
Calcium: 9.6 mg/dL (ref 8.6–10.2)
Chloride: 95 mmol/L — ABNORMAL LOW (ref 96–106)
Creatinine, Ser: 1.25 mg/dL (ref 0.76–1.27)
GFR calc Af Amer: 67 mL/min/{1.73_m2} (ref >59)
GFR calc non Af Amer: 58 mL/min/{1.73_m2} — ABNORMAL LOW (ref >59)
Globulin, Total: 2.8 g/dL (ref 1.5–4.5)
Glucose: 187 mg/dL — ABNORMAL HIGH (ref 65–99)
Potassium: 3.4 mmol/L — ABNORMAL LOW (ref 3.5–5.2)
Sodium: 136 mmol/L (ref 134–144)
Total Protein: 7.3 g/dL (ref 6.0–8.5)

## 2019-09-18 LAB — TSH: TSH: 3.33 u[IU]/mL (ref 0.450–4.500)

## 2019-09-18 MED ORDER — POTASSIUM CHLORIDE ER 20 MEQ PO TBCR: 20.0000 meq | EXTENDED_RELEASE_TABLET | Freq: Every day | ORAL | 3 refills | Status: DC

## 2019-09-18 NOTE — Patient Instructions (Addendum)
Medication Instructions:  none *If you need a refill on your cardiac medications before your next appointment, please call your pharmacy*   Lab Work:  TODAY CMET CBC MAGNESIUM TSH If you have labs (blood work) drawn today and your tests are completely normal, you will receive your results only by: Marland Kitchen MyChart Message (if you have MyChart) OR . A paper copy in the mail If you have any lab test that is abnormal or we need to change your treatment, we will call you to review the results.   Testing/Procedures:  PLEASE SCHEDULE (NEXT WEEK, IF POSSIBLE) Your physician has requested that you have an echocardiogram. Echocardiography is a painless test that uses sound waves to create images of your heart. It provides your doctor with information about the size and shape of your heart and how well your heart's chambers and valves are working. This procedure takes approximately one hour. There are no restrictions for this procedure.     Follow-Up: PENDING AFTER TEST At [facility], you and your health needs are our priority.  As part of our continuing mission to provide you with exceptional heart care, we have created designated Provider Care Teams.  These Care Teams include your primary Cardiologist (physician) and Advanced Practice Providers (APPs -  Physician Assistants and Nurse Practitioners) who all work together to provide you with the care you need, when you need it.      Other Instructions Remote monitoring is used to monitor your Pacemaker from home. This monitoring reduces the number of office visits required to check your device to one time per year. It allows [place] to keep an eye on the functioning of your device to ensure it is working properly. You are scheduled for a device check from home on 11/30/19. You may send your transmission at any time that day. If you have a wireless device, the transmission will be sent automatically. After your physician reviews your transmission, you will  receive a postcard with your next transmission date.

## 2019-09-18 NOTE — Telephone Encounter (Signed)
-----   Message from Shirley Friar, PA-C sent at 09/18/2019  4:45 PM EDT ----- CBC pending  Please have him take potassium 40 meq x 1 then start potassium 20 meq daily. Will need repeat BMET when he gets echo.   Legrand Como [address] [place], [place]

## 2019-09-18 NOTE — Telephone Encounter (Signed)
The patient has been notified of the result and verbalized understanding.  All questions (if any) were answered. Frederik Schmidt, RN 09/18/2019 4:50 PM

## 2019-09-21 ENCOUNTER — Other Ambulatory Visit (HOSPITAL_COMMUNITY): Payer: Medicare PPO

## 2019-09-22 ENCOUNTER — Ambulatory Visit (HOSPITAL_COMMUNITY): Payer: Medicare PPO | Attending: Cardiology

## 2019-09-22 ENCOUNTER — Telehealth: Payer: Self-pay | Admitting: Internal Medicine

## 2019-09-22 ENCOUNTER — Other Ambulatory Visit: Payer: Self-pay

## 2019-09-22 DIAGNOSIS — Z79899 Other long term (current) drug therapy: Secondary | ICD-10-CM

## 2019-09-22 DIAGNOSIS — R0600 Dyspnea, unspecified: Secondary | ICD-10-CM | POA: Diagnosis not present

## 2019-09-22 DIAGNOSIS — Z950 Presence of cardiac pacemaker: Secondary | ICD-10-CM | POA: Diagnosis not present

## 2019-09-22 DIAGNOSIS — R0609 Other forms of dyspnea: Secondary | ICD-10-CM

## 2019-09-22 NOTE — Telephone Encounter (Signed)
I spoke to the patient and informed him that we would f/u once the Echo has been read and final impression given.  He verbalized understanding.

## 2019-09-22 NOTE — Telephone Encounter (Signed)
Patient is requesting to discuss results from echocardiogram completed on today, 09/22/19. Please call.

## 2019-09-24 ENCOUNTER — Telehealth: Payer: Self-pay

## 2019-09-24 DIAGNOSIS — I10 Essential (primary) hypertension: Secondary | ICD-10-CM

## 2019-09-24 DIAGNOSIS — I519 Heart disease, unspecified: Secondary | ICD-10-CM

## 2019-09-24 MED ORDER — CARVEDILOL 6.25 MG PO TABS
6.2500 mg | ORAL_TABLET | Freq: Two times a day (BID) | ORAL | 3 refills | Status: DC
Start: 2019-09-24 — End: 2020-07-08

## 2019-09-24 MED ORDER — LOSARTAN POTASSIUM 25 MG PO TABS
25.0000 mg | ORAL_TABLET | Freq: Every day | ORAL | 3 refills | Status: DC
Start: 2019-09-24 — End: 2019-10-12

## 2019-09-24 NOTE — Telephone Encounter (Signed)
Follow Up  Patient is calling in to get the results for his echocardiogram. States that he is needing to know the results and the interpretation of the results. Please give patient a call back to assist.

## 2019-09-24 NOTE — Telephone Encounter (Signed)
-----   Message from Shirley Friar, PA-C sent at 09/24/2019  4:04 PM EDT ----- Discussed with Dr. [name].   EF is down from previous to 40-45%  STOP amlodipine which is directed at blood pressure alone.   Start Coreg 6.25 mg BID Start losartan 25 mg daily  Which are both directed at decreased heart function that is weaker than normal AND blood pressure.   Discussed wall motion abnormalities with Dr. Harrell Gave and would start with Leane Call (at [facility]) for ischemic work up. If it is positive, would likely then proceed to cath. If negative, may need pacemaker upgrade.   Thank you!  Legrand Como [address]" [place], PA-C  09/24/2019 4:03 PM

## 2019-09-24 NOTE — Telephone Encounter (Signed)
The patient has been notified of the Echo result and verbalized understanding.  All questions (if any) were answered. Frederik Schmidt, RN 09/24/2019 4:18 PM    Jonni Sanger conferred with Dr [name] and Dr Harrell Gave:  STOP AMLODIPINE START COREG 6.25 mg BID START LOSARTAN 25 mg Daily  Order [employer]

## 2019-10-01 ENCOUNTER — Telehealth: Payer: Self-pay

## 2019-10-01 ENCOUNTER — Telehealth (HOSPITAL_COMMUNITY): Payer: Self-pay

## 2019-10-01 NOTE — Telephone Encounter (Signed)
Spoke with the patient, detailed instructions given. He stated that he understood and would be here for his test. Asked to call back with any question. S.Aishah Teffeteller EMTP

## 2019-10-01 NOTE — Telephone Encounter (Signed)
Spoke to patient

## 2019-10-01 NOTE — Telephone Encounter (Signed)
I called the patient to see if he had any questions regarding upcoming Stress Test.  He said that he was fine and looks forward to the test on 6/8.

## 2019-10-06 ENCOUNTER — Other Ambulatory Visit: Payer: Medicare PPO

## 2019-10-06 ENCOUNTER — Ambulatory Visit (HOSPITAL_COMMUNITY): Payer: Medicare PPO | Attending: Cardiology

## 2019-10-06 ENCOUNTER — Other Ambulatory Visit: Payer: Self-pay

## 2019-10-06 DIAGNOSIS — I519 Heart disease, unspecified: Secondary | ICD-10-CM | POA: Diagnosis not present

## 2019-10-06 DIAGNOSIS — I10 Essential (primary) hypertension: Secondary | ICD-10-CM | POA: Diagnosis not present

## 2019-10-06 DIAGNOSIS — I110 Hypertensive heart disease with heart failure: Secondary | ICD-10-CM | POA: Insufficient documentation

## 2019-10-06 DIAGNOSIS — E119 Type 2 diabetes mellitus without complications: Secondary | ICD-10-CM | POA: Insufficient documentation

## 2019-10-06 DIAGNOSIS — I119 Hypertensive heart disease without heart failure: Secondary | ICD-10-CM | POA: Diagnosis present

## 2019-10-06 DIAGNOSIS — E876 Hypokalemia: Secondary | ICD-10-CM

## 2019-10-06 DIAGNOSIS — R0600 Dyspnea, unspecified: Secondary | ICD-10-CM

## 2019-10-06 DIAGNOSIS — I509 Heart failure, unspecified: Secondary | ICD-10-CM | POA: Insufficient documentation

## 2019-10-06 LAB — MYOCARDIAL PERFUSION IMAGING
LV dias vol: 156 mL (ref 62–150)
LV sys vol: 112 mL
Peak HR: 83 {beats}/min
Rest HR: 72 {beats}/min
SDS: 2
SRS: 3
SSS: 5
TID: 1.05

## 2019-10-06 LAB — BASIC METABOLIC PANEL
BUN/Creatinine Ratio: 19 (ref 10–24)
BUN: 24 mg/dL (ref 8–27)
CO2: 25 mmol/L (ref 20–29)
Calcium: 9.5 mg/dL (ref 8.6–10.2)
Chloride: 98 mmol/L (ref 96–106)
Creatinine, Ser: 1.29 mg/dL — ABNORMAL HIGH (ref 0.76–1.27)
GFR calc Af Amer: 65 mL/min/{1.73_m2} (ref >59)
GFR calc non Af Amer: 56 mL/min/{1.73_m2} — ABNORMAL LOW (ref >59)
Glucose: 209 mg/dL — ABNORMAL HIGH (ref 65–99)
Potassium: 4.3 mmol/L (ref 3.5–5.2)
Sodium: 136 mmol/L (ref 134–144)

## 2019-10-06 MED ORDER — TECHNETIUM TC 99M TETROFOSMIN IV KIT
10.6000 | PACK | Freq: Once | INTRAVENOUS | Status: AC | PRN
  Administered 2019-10-06: 10.6 via INTRAVENOUS
  Filled 2019-10-06: qty 11

## 2019-10-06 MED ORDER — REGADENOSON 0.4 MG/5ML IV SOLN
0.4000 mg | Freq: Once | INTRAVENOUS | Status: AC
  Administered 2019-10-06: 0.4 mg via INTRAVENOUS

## 2019-10-06 MED ORDER — TECHNETIUM TC 99M TETROFOSMIN IV KIT
30.7000 | PACK | Freq: Once | INTRAVENOUS | Status: AC | PRN
  Administered 2019-10-06: 30.7 via INTRAVENOUS
  Filled 2019-10-06: qty 31

## 2019-10-07 ENCOUNTER — Other Ambulatory Visit: Payer: Medicare PPO

## 2019-10-07 ENCOUNTER — Other Ambulatory Visit (HOSPITAL_COMMUNITY): Payer: Medicare PPO

## 2019-10-07 ENCOUNTER — Telehealth: Payer: Self-pay

## 2019-10-07 NOTE — Telephone Encounter (Signed)
-----   Message from Shirley Friar, PA-C sent at 10/07/2019  2:07 PM EDT ----- Labs stable with medication adjustments.   Stress test with low EF.   Can we please schedule him to see me in clinic next week, and with Dr. [name] in person in 4 weeks per Dr. [name]?    Thank you!  Legrand Como [address]" [place], PA-C  10/07/2019 2:07 PM

## 2019-10-07 NOTE — Telephone Encounter (Signed)
The patient has been notified of the lab and stress test result and verbalized understanding.  All questions (if any) were answered. Frederik Schmidt, RN 10/07/2019 2:15 PM

## 2019-10-11 NOTE — Progress Notes (Signed)
Electrophysiology Office Note Date: 10/12/2019  ID:  Tony [name], DOB Oct 09, 1950, MRN [id number]  PCP: Lajean Manes, MD Primary Cardiologist: [name], MD Electrophysiologist: [name], MD   CC: Pacemaker follow-up  Tony [name] is a 69 y.o. male seen today for [name], MD for routine electrophysiology followup.  Since last being seen in our clinic the patient reports doing about the same. He remains SOB with moderate exertion, that stops relatively quickly once he is at rest.  He was relieved to have no evidence of ischemic disease or "blockages" but remains concerned about EF. His wife previously did very well on Entresto. No chest pain. Continues to have heaviness in his bilateral upper arms and dyspnea with moderate exertion. His symptoms have not worsened.   Echo 09/22/2019 with Moderate AS Myoview 10/06/19 with EF 28%, but no ischemia or previous infarction.  Device History: Medtronic Dual Chamber PPM implanted 07/2014 for CHB  Past Medical History:  Diagnosis Date  . Cancer ([place])    thyroid  . Complete heart block (H[facility])    a. s/p MDT dual chamber PPM followed by Dr [name]   . Complication of anesthesia    1985 after Thyriodectomy hard time waking up  . Diabetes mellitus   . GERD (gastroesophageal reflux disease)   . Hypertension   . Hypothyroidism    Had two surgeries for Cancer   Past Surgical History:  Procedure Laterality Date  . LUMBAR LAMINECTOMY/DECOMPRESSION MICRODISCECTOMY  07/19/2011   Procedure: LUMBAR LAMINECTOMY/DECOMPRESSION MICRODISCECTOMY 3 LEVELS;  Surgeon: Melina Schools, MD;  Location: [facility];  Service: Orthopedics;  Laterality: Left;  Lumbar three-Lumbar five LEFT DECOMPRESSION AND FORAMINOTOMY Lumbar three-four LEFT DISCECTOMY  . PERMANENT PACEMAKER INSERTION N/A 08/11/2014   MDT Adapta L implanted by Dr [name] for CHB  . Thyroidectomy x2      Current Outpatient Medications  Medication Sig Dispense Refill  . acetaminophen  (TYLENOL) 500 MG tablet Take 500 mg by mouth every 6 (six) hours as needed (pain).     Marland Kitchen aspirin EC 81 MG tablet Take 81 mg by mouth daily.    Marland Kitchen atorvastatin (LIPITOR) 10 MG tablet Take 10 mg by mouth daily.    . carvedilol (COREG) 6.25 MG tablet Take 1 tablet (6.25 mg total) by mouth 2 (two) times daily. 180 tablet 3  . Cholecalciferol (VITAMIN D3) 25 MCG (1000 UT) CAPS Take by mouth daily.    . cyclobenzaprine (FLEXERIL) 10 MG tablet Take 10 mg by mouth 3 (three) times daily as needed. For muscle pain.    Marland Kitchen gabapentin (NEURONTIN) 100 MG capsule Take 2 capsules (200 mg total) by mouth at bedtime. 60 capsule 2  . glipiZIDE (GLUCOTROL) 10 MG tablet Take 10 mg by mouth daily.     Marland Kitchen ibuprofen (ADVIL,MOTRIN) 800 MG tablet Take 800 mg by mouth every 8 (eight) hours as needed. For pain.    . INVOKAMET XR [id number] MG TB24 Take 2 tablets by mouth daily.     Marland Kitchen levothyroxine (SYNTHROID, LEVOTHROID) 175 MCG tablet Take 175 mcg by mouth daily before breakfast.     . Multiple Vitamins-Minerals (SENIOR MULTIVITAMIN PLUS PO) Take by mouth daily.    . mupirocin ointment (BACTROBAN) 2 % Apply 1 application topically as needed.     Marland Kitchen omeprazole (PRILOSEC) 20 MG capsule Take 20 mg by mouth daily as needed. For acid reflux    . potassium chloride 20 MEQ TBCR Take 20 mEq by mouth daily. 90 tablet 3  .  sertraline (ZOLOFT) 100 MG tablet Take 200 mg by mouth daily.     . Tamsulosin HCl (FLOMAX) 0.4 MG CAPS Take 1 capsule (0.4 mg total) by mouth every morning. 30 capsule 0  . TRULICITY 3 PY/1.9JK SOPN once a week.    . Vitamin D, Ergocalciferol, (DRISDOL) 1.25 MG (50000 UNIT) CAPS capsule 50,000 Units once a week.     . sacubitril-valsartan (ENTRESTO) 24-26 MG Take 1 tablet by mouth 2 (two) times daily. 180 tablet 3   No current facility-administered medications for this visit.    Allergies:   Corticosteroids, Crestor [rosuvastatin calcium], and Simvastatin   Social History: Social History   Socioeconomic  History  . Marital status: Married    Spouse name: jody  . Number of children: 2  . Years of education: college  . Highest education level: Not on file  Occupational History  . Occupation: retired  Tobacco Use  . Smoking status: Never Smoker  . Smokeless tobacco: Never Used  Vaping Use  . Vaping Use: Never used  Substance and Sexual Activity  . Alcohol use: No    Alcohol/week: 0.0 standard drinks  . Drug use: No  . Sexual activity: Not on file  Other Topics Concern  . Not on file  Social History Narrative  . Not on file   Social Determinants of Health   Financial Resource Strain:   . Difficulty of Paying Living Expenses:   Food Insecurity:   . Worried About Charity fundraiser in the Last Year:   . Arboriculturist in the Last Year:   Transportation Needs:   . Film/video editor (Medical):   Marland Kitchen Lack of Transportation (Non-Medical):   Physical Activity:   . Days of Exercise per Week:   . Minutes of Exercise per Session:   Stress:   . Feeling of Stress :   Social Connections:   . Frequency of Communication with Friends and Family:   . Frequency of Social Gatherings with Friends and Family:   . Attends Religious Services:   . Active Member of Clubs or Organizations:   . Attends Archivist Meetings:   Marland Kitchen Marital Status:   Intimate Partner Violence:   . Fear of Current or Ex-Partner:   . Emotionally Abused:   Marland Kitchen Physically Abused:   . Sexually Abused:     Family History: Family History  Problem Relation Age of Onset  . Lung cancer Mother   . Heart disease Father   . Heart attack Father   . Healthy Sister   . Healthy Daughter   . Healthy Son   . Anesthesia problems Neg Hx   . Hypotension Neg Hx   . Pseudochol deficiency Neg Hx      Review of Systems: All other systems reviewed and are otherwise negative except as noted above.  Physical Exam: Vitals:   10/12/19 1047  BP: 130/78  Pulse: 97  SpO2: 97%  Weight: 243 lb (110.2 kg)  Height: 6'  1" (1.854 m)     GEN- The patient is well appearing, alert and oriented x 3 today.   HEENT: normocephalic, atraumatic; sclera clear, conjunctiva pink; hearing intact; oropharynx clear; neck supple  Lungs- Clear to ausculation bilaterally, normal work of breathing.  No wheezes, rales, rhonchi Heart- Regular rate and rhythm, no murmurs, rubs or gallops  GI- soft, non-tender, non-distended, bowel sounds present  Extremities- no clubbing, cyanosis, or edema  MS- no significant deformity or atrophy Skin- warm and dry, no  rash or lesion; PPM pocket well healed Psych- euthymic mood, full affect Neuro- strength and sensation are intact  PPM Interrogation- reviewed in detail today,  See PACEART report  EKG:  EKG is not ordered today.  Recent Labs: 09/18/2019: ALT 18; Hemoglobin 14.2; Magnesium 1.7; Platelets 253; TSH 3.330 10/06/2019: BUN 24; Creatinine, Ser 1.29; Potassium 4.3; Sodium 136   Wt Readings from Last 3 Encounters:  10/12/19 243 lb (110.2 kg)  10/06/19 240 lb (108.9 kg)  09/18/19 240 lb (108.9 kg)     Other studies Reviewed: Additional studies/ records that were reviewed today include: Previous EP office notes, Previous remote checks, Most recent labwork.   Assessment and Plan:  1. CHB s/p Medtronic PPM  Normal PPM function See Pace Art report No changes today  2. Chronic systolic CHF Recent diagnosis.  EF ~28% by myoview NYHA II-III symptoms Continue coreg 6.25 mg BID Stop losartan. Start Entresto 24/26 mg BID.  BMET today.  Stop HcTz.  Myoview 10/06/2019 with EF 28%. Non-ischemic.   3. HTN Adjust meds in setting of treating #2  4. Moderate AS Stable by Echo.  Current medicines are reviewed at length with the patient today.   The patient does not have concerns regarding his medicines.  The following changes were made today:  Change to entresto as above.   Labs/ tests ordered today include:  Orders Placed This Encounter  Procedures  . Basic Metabolic Panel  (BMET)  . CUP PACEART INCLINIC DEVICE CHECK   Disposition:   Follow up with EP APP in 2 weeks for continued med titration, then Dr. [name] in 4 weeks to discuss additional options including possible need for upgrade to CRT.   Jacalyn Lefevre, PA-C  10/12/2019 11:30 AM  [facility] [address] [place] [address] [phone] (office) [phone] (fax)

## 2019-10-12 ENCOUNTER — Encounter: Payer: Self-pay | Admitting: Student

## 2019-10-12 ENCOUNTER — Ambulatory Visit: Payer: Medicare PPO | Admitting: Student

## 2019-10-12 ENCOUNTER — Other Ambulatory Visit: Payer: Self-pay

## 2019-10-12 VITALS — BP 130/78 | HR 97 | Ht 73.0 in | Wt 243.0 lb

## 2019-10-12 DIAGNOSIS — R0609 Other forms of dyspnea: Secondary | ICD-10-CM

## 2019-10-12 DIAGNOSIS — R0600 Dyspnea, unspecified: Secondary | ICD-10-CM | POA: Diagnosis not present

## 2019-10-12 DIAGNOSIS — I350 Nonrheumatic aortic (valve) stenosis: Secondary | ICD-10-CM | POA: Diagnosis not present

## 2019-10-12 DIAGNOSIS — I519 Heart disease, unspecified: Secondary | ICD-10-CM | POA: Diagnosis not present

## 2019-10-12 DIAGNOSIS — Z950 Presence of cardiac pacemaker: Secondary | ICD-10-CM | POA: Diagnosis not present

## 2019-10-12 DIAGNOSIS — I10 Essential (primary) hypertension: Secondary | ICD-10-CM | POA: Diagnosis not present

## 2019-10-12 DIAGNOSIS — I442 Atrioventricular block, complete: Secondary | ICD-10-CM | POA: Diagnosis not present

## 2019-10-12 LAB — CUP PACEART INCLINIC DEVICE CHECK
Battery Impedance: 424 Ohm
Battery Remaining Longevity: 90 mo
Battery Voltage: 2.78 V
Brady Statistic AP VP Percent: 12 %
Brady Statistic AP VS Percent: 0 %
Brady Statistic AS VP Percent: 87 %
Brady Statistic AS VS Percent: 1 %
Date Time Interrogation Session: 20210614112358
Implantable Lead Implant Date: 20160413
Implantable Lead Implant Date: 20160413
Implantable Lead Location: 753859
Implantable Lead Location: 753860
Implantable Lead Model: 5076
Implantable Lead Model: 5076
Implantable Pulse Generator Implant Date: 20160413
Lead Channel Impedance Value: 448 Ohm
Lead Channel Impedance Value: 583 Ohm
Lead Channel Pacing Threshold Amplitude: 0.5 V
Lead Channel Pacing Threshold Amplitude: 0.5 V
Lead Channel Pacing Threshold Amplitude: 0.625 V
Lead Channel Pacing Threshold Amplitude: 0.75 V
Lead Channel Pacing Threshold Pulse Width: 0.4 ms
Lead Channel Pacing Threshold Pulse Width: 0.4 ms
Lead Channel Pacing Threshold Pulse Width: 0.4 ms
Lead Channel Pacing Threshold Pulse Width: 0.4 ms
Lead Channel Sensing Intrinsic Amplitude: 2.8 mV
Lead Channel Setting Pacing Amplitude: 2 V
Lead Channel Setting Pacing Amplitude: 2.5 V
Lead Channel Setting Pacing Pulse Width: 0.4 ms
Lead Channel Setting Sensing Sensitivity: 4 mV

## 2019-10-12 LAB — BASIC METABOLIC PANEL
BUN/Creatinine Ratio: 13 (ref 10–24)
BUN: 17 mg/dL (ref 8–27)
CO2: 25 mmol/L (ref 20–29)
Calcium: 9.3 mg/dL (ref 8.6–10.2)
Chloride: 98 mmol/L (ref 96–106)
Creatinine, Ser: 1.32 mg/dL — ABNORMAL HIGH (ref 0.76–1.27)
GFR calc Af Amer: 63 mL/min/{1.73_m2} (ref >59)
GFR calc non Af Amer: 54 mL/min/{1.73_m2} — ABNORMAL LOW (ref >59)
Glucose: 163 mg/dL — ABNORMAL HIGH (ref 65–99)
Potassium: 3.8 mmol/L (ref 3.5–5.2)
Sodium: 138 mmol/L (ref 134–144)

## 2019-10-12 MED ORDER — ENTRESTO 24-26 MG PO TABS: 1.0000 | ORAL_TABLET | Freq: Two times a day (BID) | ORAL | 3 refills | Status: DC

## 2019-10-12 NOTE — Patient Instructions (Addendum)
Medication Instructions:  STOP HCTZ  STOP LOSARTAN  START Entresto 24-26 mg Twice Daily *If you need a refill on your cardiac medications before your next appointment, please call your pharmacy*   Lab Work:  TODAY BMET If you have labs (blood work) drawn today and your tests are completely normal, you will receive your results only by: Marland Kitchen MyChart Message (if you have MyChart) OR . A paper copy in the mail If you have any lab test that is abnormal or we need to change your treatment, we will call you to review the results.   Testing/Procedures: none   Follow-Up: 2 weeks with Oda Kilts, PA At [facility], you and your health needs are our priority.  As part of our continuing mission to provide you with exceptional heart care, we have created designated Provider Care Teams.  These Care Teams include your primary Cardiologist (physician) and Advanced Practice Providers (APPs -  Physician Assistants and Nurse Practitioners) who all work together to provide you with the care you need, when you need it.     Other Instructions Remote monitoring is used to monitor your Pacemaker from home. This monitoring reduces the number of office visits required to check your device to one time per year. It allows [place] to keep an eye on the functioning of your device to ensure it is working properly. You are scheduled for a device check from home on 11/30/19. You may send your transmission at any time that day. If you have a wireless device, the transmission will be sent automatically. After your physician reviews your transmission, you will receive a postcard with your next transmission date.   Sacubitril; Valsartan Oral Tablets What is this medicine? SACUBITRIL; VALSARTAN (sak UE bi tril; val SAR tan) is a combination of a neprilysin inhibitor and a an angiotensin II receptor blocker. It treats heart failure. This medicine may be used for other purposes; ask your health care provider or pharmacist if  you have questions. COMMON BRAND NAME(S): Entresto What should I tell my health care provider before I take this medicine? They need to know if you have any of these conditions:  diabetes and take a medicine that contains aliskiren  kidney disease  liver disease  an unusual or allergic reaction to sacubitril; valsartan, drugs called angiotensin converting enzyme (ACE) inhibitors, angiotensin II receptor blockers (ARBs), other medicines, foods, dyes, or preservatives  pregnant or trying to get pregnant  breast-feeding How should I use this medicine? Take this drug by mouth. Take it as directed on the prescription label at the same time every day. You can take it with or without food. If it upsets your stomach, take it with food. Keep taking it unless your health care provider tells you to stop. Talk to your health care provider about the use of this drug in children. While it may be prescribed for children as young as 1 for selected conditions, precautions do apply. Overdosage: If you think you have taken too much of this medicine contact a poison control center or emergency room at once. NOTE: This medicine is only for you. Do not share this medicine with others. What if I miss a dose? If you miss a dose, take it as soon as you can. If it is almost time for your next dose, take only that dose. Do not take double or extra doses. What may interact with this medicine? Do not take this medicine with any of the following medicines:  aliskiren if you have  diabetes  angiotensin-converting enzyme (ACE) inhibitors, like benazepril, captopril, enalapril, fosinopril, lisinopril, or ramipril This medicine may also interact with the following medicines:  angiotensin II receptor blockers (ARBs) like azilsartan, candesartan, eprosartan, irbesartan, losartan, olmesartan, telmisartan, or valsartan  lithium  NSAIDS, medicines for pain and inflammation, like ibuprofen or naproxen  potassium-sparing  diuretics like amiloride, spironolactone, and triamterene  potassium supplements This list may not describe all possible interactions. Give your health care provider a list of all the medicines, herbs, non-prescription drugs, or dietary supplements you use. Also tell them if you smoke, drink alcohol, or use illegal drugs. Some items may interact with your medicine. What should I watch for while using this medicine? Tell your doctor or healthcare professional if your symptoms do not start to get better or if they get worse. Do not become pregnant while taking this medicine. Women should inform their doctor if they wish to become pregnant or think they might be pregnant. There is a potential for serious side effects to an unborn child. Talk to your health care professional or pharmacist for more information. You may get dizzy. Do not drive, use machinery, or do anything that needs mental alertness until you know how this medicine affects you. Do not stand or sit up quickly, especially if you are an older patient. This reduces the risk of dizzy or fainting spells. Avoid alcoholic drinks; they can make you more dizzy. What side effects may I notice from receiving this medicine? Side effects that you should report to your doctor or health care professional as soon as possible:  allergic reactions like skin rash, itching or hives, swelling of the face, lips, or tongue  signs and symptoms of increased potassium like muscle weakness; chest pain; or fast, irregular heartbeat  signs and symptoms of kidney injury like trouble passing urine or change in the amount of urine  signs and symptoms of low blood pressure like feeling dizzy or lightheaded, or if you develop extreme fatigue Side effects that usually do not require medical attention (report to your doctor or health care professional if they continue or are bothersome):  cough This list may not describe all possible side effects. Call your doctor for  medical advice about side effects. You may report side effects to FDA at 1-800-FDA-1088. Where should I keep my medicine? Keep out of the reach of children and pets. Store at room temperature between 20 and 25 degrees C (68 and 77 degrees F). Protect from moisture. Keep the container tightly closed. Throw away any unused drug after the expiration date. NOTE: This sheet is a summary. It may not cover all possible information. If you have questions about this medicine, talk to your doctor, pharmacist, or health care provider.  2020 Elsevier/Gold Standard (2018-11-19 16:03:07)

## 2019-10-23 DIAGNOSIS — E113292 Type 2 diabetes mellitus with mild nonproliferative diabetic retinopathy without macular edema, left eye: Secondary | ICD-10-CM | POA: Diagnosis not present

## 2019-10-23 DIAGNOSIS — H43813 Vitreous degeneration, bilateral: Secondary | ICD-10-CM | POA: Diagnosis not present

## 2019-10-23 DIAGNOSIS — H47013 Ischemic optic neuropathy, bilateral: Secondary | ICD-10-CM | POA: Diagnosis not present

## 2019-10-23 DIAGNOSIS — H26491 Other secondary cataract, right eye: Secondary | ICD-10-CM | POA: Diagnosis not present

## 2019-10-23 DIAGNOSIS — H26493 Other secondary cataract, bilateral: Secondary | ICD-10-CM | POA: Diagnosis not present

## 2019-10-23 DIAGNOSIS — H35033 Hypertensive retinopathy, bilateral: Secondary | ICD-10-CM | POA: Diagnosis not present

## 2019-10-23 DIAGNOSIS — H43812 Vitreous degeneration, left eye: Secondary | ICD-10-CM | POA: Diagnosis not present

## 2019-10-23 DIAGNOSIS — H47011 Ischemic optic neuropathy, right eye: Secondary | ICD-10-CM | POA: Diagnosis not present

## 2019-10-23 DIAGNOSIS — H43811 Vitreous degeneration, right eye: Secondary | ICD-10-CM | POA: Diagnosis not present

## 2019-10-29 NOTE — Progress Notes (Deleted)
Electrophysiology Office Note Date: 10/29/2019  ID:  Tony [name], DOB 1951-03-15, MRN [id number]  PCP: Lajean Manes, MD Primary Cardiologist: [name], MD Electrophysiologist: [name], MD  CC: Pacemaker follow-up  Tony [name] is a 69 y.o. male seen today for [name], MD for {Blank single:19197::"cardiac clearance","post hospital follow up","acute visit due to ***","routine electrophysiology followup"}.  Since {Blank single:19197::"last being seen in our clinic","discharge from hospital"} the patient reports doing ***.  he denies chest pain, palpitations, dyspnea, PND, orthopnea, nausea, vomiting, dizziness, syncope, edema, weight gain, or early satiety.  Device History: Medtronic Dual Chamber PPM implanted 07/2014 for CHB  Past Medical History:  Diagnosis Date  . Cancer ([place])    thyroid  . Complete heart block (HCC)    a. s/p MDT dual chamber PPM followed by Dr [name]   . Complication of anesthesia    1985 after Thyriodectomy hard time waking up  . Diabetes mellitus   . GERD (gastroesophageal reflux disease)   . Hypertension   . Hypothyroidism    Had two surgeries for Cancer   Past Surgical History:  Procedure Laterality Date  . LUMBAR LAMINECTOMY/DECOMPRESSION MICRODISCECTOMY  07/19/2011   Procedure: LUMBAR LAMINECTOMY/DECOMPRESSION MICRODISCECTOMY 3 LEVELS;  Surgeon: Melina Schools, MD;  Location: [facility];  Service: Orthopedics;  Laterality: Left;  Lumbar three-Lumbar five LEFT DECOMPRESSION AND FORAMINOTOMY Lumbar three-four LEFT DISCECTOMY  . PERMANENT PACEMAKER INSERTION N/A 08/11/2014   MDT Adapta L implanted by Dr [name] for CHB  . Thyroidectomy x2      Current Outpatient Medications  Medication Sig Dispense Refill  . acetaminophen (TYLENOL) 500 MG tablet Take 500 mg by mouth every 6 (six) hours as needed (pain).     Marland Kitchen aspirin EC 81 MG tablet Take 81 mg by mouth daily.    Marland Kitchen atorvastatin (LIPITOR) 10 MG tablet Take 10 mg by mouth daily.    .  carvedilol (COREG) 6.25 MG tablet Take 1 tablet (6.25 mg total) by mouth 2 (two) times daily. 180 tablet 3  . Cholecalciferol (VITAMIN D3) 25 MCG (1000 UT) CAPS Take by mouth daily.    . cyclobenzaprine (FLEXERIL) 10 MG tablet Take 10 mg by mouth 3 (three) times daily as needed. For muscle pain.    Marland Kitchen gabapentin (NEURONTIN) 100 MG capsule Take 2 capsules (200 mg total) by mouth at bedtime. 60 capsule 2  . glipiZIDE (GLUCOTROL) 10 MG tablet Take 10 mg by mouth daily.     Marland Kitchen ibuprofen (ADVIL,MOTRIN) 800 MG tablet Take 800 mg by mouth every 8 (eight) hours as needed. For pain.    . INVOKAMET XR [phone] MG TB24 Take 2 tablets by mouth daily.     Marland Kitchen levothyroxine (SYNTHROID, LEVOTHROID) 175 MCG tablet Take 175 mcg by mouth daily before breakfast.     . Multiple Vitamins-Minerals (SENIOR MULTIVITAMIN PLUS PO) Take by mouth daily.    . mupirocin ointment (BACTROBAN) 2 % Apply 1 application topically as needed.     Marland Kitchen omeprazole (PRILOSEC) 20 MG capsule Take 20 mg by mouth daily as needed. For acid reflux    . potassium chloride 20 MEQ TBCR Take 20 mEq by mouth daily. 90 tablet 3  . sacubitril-valsartan (ENTRESTO) 24-26 MG Take 1 tablet by mouth 2 (two) times daily. 180 tablet 3  . sertraline (ZOLOFT) 100 MG tablet Take 200 mg by mouth daily.     . Tamsulosin HCl (FLOMAX) 0.4 MG CAPS Take 1 capsule (0.4 mg total) by mouth every morning. 30 capsule 0  .  TRULICITY 3 RA/0.7MA SOPN once a week.    . Vitamin D, Ergocalciferol, (DRISDOL) 1.25 MG (50000 UNIT) CAPS capsule 50,000 Units once a week.      No current facility-administered medications for this visit.    Allergies:   Corticosteroids, Crestor [rosuvastatin calcium], and Simvastatin   Social History: Social History   Socioeconomic History  . Marital status: Married    Spouse name: jody  . Number of children: 2  . Years of education: college  . Highest education level: Not on file  Occupational History  . Occupation: retired  Tobacco Use  .  Smoking status: Never Smoker  . Smokeless tobacco: Never Used  Vaping Use  . Vaping Use: Never used  Substance and Sexual Activity  . Alcohol use: No    Alcohol/week: 0.0 standard drinks  . Drug use: No  . Sexual activity: Not on file  Other Topics Concern  . Not on file  Social History Narrative  . Not on file   Social Determinants of Health   Financial Resource Strain:   . Difficulty of Paying Living Expenses:   Food Insecurity:   . Worried About Charity fundraiser in the Last Year:   . Arboriculturist in the Last Year:   Transportation Needs:   . Film/video editor (Medical):   Marland Kitchen Lack of Transportation (Non-Medical):   Physical Activity:   . Days of Exercise per Week:   . Minutes of Exercise per Session:   Stress:   . Feeling of Stress :   Social Connections:   . Frequency of Communication with Friends and Family:   . Frequency of Social Gatherings with Friends and Family:   . Attends Religious Services:   . Active Member of Clubs or Organizations:   . Attends Archivist Meetings:   Marland Kitchen Marital Status:   Intimate Partner Violence:   . Fear of Current or Ex-Partner:   . Emotionally Abused:   Marland Kitchen Physically Abused:   . Sexually Abused:     Family History: Family History  Problem Relation Age of Onset  . Lung cancer Mother   . Heart disease Father   . Heart attack Father   . Healthy Sister   . Healthy Daughter   . Healthy Son   . Anesthesia problems Neg Hx   . Hypotension Neg Hx   . Pseudochol deficiency Neg Hx      Review of Systems: All other systems reviewed and are otherwise negative except as noted above.  Physical Exam: There were no vitals filed for this visit.   GEN- The patient is well appearing, alert and oriented x 3 today.   HEENT: normocephalic, atraumatic; sclera clear, conjunctiva pink; hearing intact; oropharynx clear; neck supple  Lungs- Clear to ausculation bilaterally, normal work of breathing.  No wheezes, rales,  rhonchi Heart- Regular rate and rhythm, no murmurs, rubs or gallops  GI- soft, non-tender, non-distended, bowel sounds present  Extremities- no clubbing, cyanosis, or edema  MS- no significant deformity or atrophy Skin- warm and dry, no rash or lesion; PPM pocket well healed Psych- euthymic mood, full affect Neuro- strength and sensation are intact  PPM Interrogation- reviewed in detail today,  See PACEART report  EKG:  EKG {ACTION; IS/IS [id number]} ordered today. The ekg ordered today shows ***  Recent Labs: 09/18/2019: ALT 18; Hemoglobin 14.2; Magnesium 1.7; Platelets 253; TSH 3.330 10/12/2019: BUN 17; Creatinine, Ser 1.32; Potassium 3.8; Sodium 138   Wt Readings from Last 3  Encounters:  10/12/19 243 lb (110.2 kg)  10/06/19 240 lb (108.9 kg)  09/18/19 240 lb (108.9 kg)     Other studies Reviewed: Additional studies/ records that were reviewed today include: Previous EP office notes, Previous remote checks, Most recent labwork.   Assessment and Plan:  1. CHB s/p Medtronic PPM  Normal PPM function  See Pace Art report No changes today.   2. Chronic systolic CHF Recent diagnosis EF~28% by myoview, non-ischemic NYHA II-III symptoms Continue coreg 6.25 mg BID Continue Entresto 24/26 mg BID  3. HTN Adjust meds in setting of treating #2.   4. Moderate AS Stable by Echo.    Current medicines are reviewed at length with the patient today.   The patient {ACTIONS; HAS/DOES NOT HAVE:19233} concerns regarding his medicines.  The following changes were made today:  {NONE DEFAULTED:18576::"none"}  Labs/ tests ordered today include: *** No orders of the defined types were placed in this encounter.    Disposition:   Follow up with {Blank single:19197::"Dr. Allred","Dr. Arlan Organ. Klein","Dr. Camnitz","EP APP"} in *** {Blank single:19197::"Months","Weeks"}    Signed, Shirley Friar, PA-C  10/29/2019 2:44 PM  [place] [address] [phone] (office) [phone] (fax)

## 2019-10-30 ENCOUNTER — Telehealth: Payer: Self-pay | Admitting: Internal Medicine

## 2019-10-30 ENCOUNTER — Encounter: Payer: Medicare PPO | Admitting: Student

## 2019-10-30 NOTE — Telephone Encounter (Signed)
Patient wants to know if he should have lab work done prior to his 11/04/19 appt with Dr. [name].

## 2019-11-04 ENCOUNTER — Other Ambulatory Visit: Payer: Self-pay

## 2019-11-04 ENCOUNTER — Ambulatory Visit: Payer: Medicare PPO | Admitting: Internal Medicine

## 2019-11-04 ENCOUNTER — Encounter: Payer: Self-pay | Admitting: Internal Medicine

## 2019-11-04 VITALS — BP 114/72 | HR 72 | Ht 73.0 in | Wt 242.0 lb

## 2019-11-04 DIAGNOSIS — I350 Nonrheumatic aortic (valve) stenosis: Secondary | ICD-10-CM | POA: Diagnosis not present

## 2019-11-04 DIAGNOSIS — I5022 Chronic systolic (congestive) heart failure: Secondary | ICD-10-CM

## 2019-11-04 DIAGNOSIS — I10 Essential (primary) hypertension: Secondary | ICD-10-CM | POA: Diagnosis not present

## 2019-11-04 DIAGNOSIS — Z950 Presence of cardiac pacemaker: Secondary | ICD-10-CM | POA: Diagnosis not present

## 2019-11-04 DIAGNOSIS — I442 Atrioventricular block, complete: Secondary | ICD-10-CM

## 2019-11-04 DIAGNOSIS — I5042 Chronic combined systolic (congestive) and diastolic (congestive) heart failure: Secondary | ICD-10-CM | POA: Insufficient documentation

## 2019-11-04 LAB — CUP PACEART INCLINIC DEVICE CHECK
Battery Impedance: 499 Ohm
Battery Remaining Longevity: 84 mo
Battery Voltage: 2.77 V
Brady Statistic AP VP Percent: 16 %
Brady Statistic AP VS Percent: 0 %
Brady Statistic AS VP Percent: 83 %
Brady Statistic AS VS Percent: 1 %
Date Time Interrogation Session: 20210707161415
Implantable Lead Implant Date: 20160413
Implantable Lead Implant Date: 20160413
Implantable Lead Location: 753859
Implantable Lead Location: 753860
Implantable Lead Model: 5076
Implantable Lead Model: 5076
Implantable Pulse Generator Implant Date: 20160413
Lead Channel Impedance Value: 448 Ohm
Lead Channel Impedance Value: 560 Ohm
Lead Channel Pacing Threshold Amplitude: 0.375 V
Lead Channel Pacing Threshold Amplitude: 0.625 V
Lead Channel Pacing Threshold Amplitude: 0.75 V
Lead Channel Pacing Threshold Amplitude: 1.25 V
Lead Channel Pacing Threshold Pulse Width: 0.4 ms
Lead Channel Pacing Threshold Pulse Width: 0.4 ms
Lead Channel Pacing Threshold Pulse Width: 0.4 ms
Lead Channel Pacing Threshold Pulse Width: 0.4 ms
Lead Channel Sensing Intrinsic Amplitude: 2.8 mV
Lead Channel Setting Pacing Amplitude: 2 V
Lead Channel Setting Pacing Amplitude: 2.5 V
Lead Channel Setting Pacing Pulse Width: 0.4 ms
Lead Channel Setting Sensing Sensitivity: 4 mV

## 2019-11-04 NOTE — Progress Notes (Signed)
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  Since last being seen in our clinic, the patient reports doing very well.  His EF was noted to be midly depressed with nonischemic findings on myoview.  He has been started on guideline directed therapy with resolutions of symptoms.  He feels well currently.  Today, he denies symptoms of palpitations, chest pain, shortness of breath,  lower extremity edema, dizziness, presyncope, or syncope.  The patient is otherwise without complaint today.   Past Medical History:  Diagnosis Date  . Cancer ([place])    thyroid  . Complete heart block (HCC)    a. s/p MDT dual chamber PPM followed by Dr [name]   . Complication of anesthesia    1985 after Thyriodectomy hard time waking up  . Diabetes mellitus   . GERD (gastroesophageal reflux disease)   . Hypertension   . Hypothyroidism    Had two surgeries for Cancer   Past Surgical History:  Procedure Laterality Date  . LUMBAR LAMINECTOMY/DECOMPRESSION MICRODISCECTOMY  07/19/2011   Procedure: LUMBAR LAMINECTOMY/DECOMPRESSION MICRODISCECTOMY 3 LEVELS;  Surgeon: Melina Schools, MD;  Location: [facility];  Service: Orthopedics;  Laterality: Left;  Lumbar three-Lumbar five LEFT DECOMPRESSION AND FORAMINOTOMY Lumbar three-four LEFT DISCECTOMY  . PERMANENT PACEMAKER INSERTION N/A 08/11/2014   MDT Adapta L implanted by Dr [name] for CHB  . Thyroidectomy x2      ROS- all systems are reviewed and negative except as per HPI above  Current Outpatient Medications  Medication Sig Dispense Refill  . acetaminophen (TYLENOL) 500 MG tablet Take 500 mg by mouth every 6 (six) hours as needed (pain).     Marland Kitchen aspirin EC 81 MG tablet Take 81 mg by mouth daily.    Marland Kitchen atorvastatin (LIPITOR) 10 MG tablet Take 10 mg by mouth daily.    . carvedilol (COREG) 6.25 MG tablet Take 1 tablet (6.25 mg total) by mouth 2 (two) times daily. 180 tablet  3  . Cholecalciferol (VITAMIN D3) 25 MCG (1000 UT) CAPS Take by mouth daily.    . cyclobenzaprine (FLEXERIL) 10 MG tablet Take 10 mg by mouth 3 (three) times daily as needed. For muscle pain.    Marland Kitchen gabapentin (NEURONTIN) 100 MG capsule Take 2 capsules (200 mg total) by mouth at bedtime. 60 capsule 2  . glipiZIDE (GLUCOTROL) 10 MG tablet Take 10 mg by mouth daily.     Marland Kitchen ibuprofen (ADVIL,MOTRIN) 800 MG tablet Take 800 mg by mouth every 8 (eight) hours as needed. For pain.    . INVOKAMET XR [phone] MG TB24 Take 2 tablets by mouth daily.     Marland Kitchen levothyroxine (SYNTHROID, LEVOTHROID) 175 MCG tablet Take 175 mcg by mouth daily before breakfast.     . Multiple Vitamins-Minerals (SENIOR MULTIVITAMIN PLUS PO) Take by mouth daily.    . mupirocin ointment (BACTROBAN) 2 % Apply 1 application topically as needed.     Marland Kitchen omeprazole (PRILOSEC) 20 MG capsule Take 20 mg by mouth daily as needed. For acid reflux    . potassium chloride 20 MEQ TBCR Take 20 mEq by mouth daily. 90 tablet 3  . sacubitril-valsartan (ENTRESTO) 24-26 MG Take 1 tablet by mouth 2 (two) times daily. 180 tablet 3  . sertraline (ZOLOFT) 100 MG tablet Take 200 mg by mouth daily.     . Tamsulosin HCl (FLOMAX) 0.4 MG CAPS Take 1 capsule (0.4 mg  total) by mouth every morning. 30 capsule 0  . TRULICITY 3 JS/2.8BT SOPN once a week.    . Vitamin D, Ergocalciferol, (DRISDOL) 1.25 MG (50000 UNIT) CAPS capsule 50,000 Units once a week.      No current facility-administered medications for this visit.    Physical Exam: Vitals:   11/04/19 1257  BP: 114/72  Pulse: 72  SpO2: 96%  Weight: 242 lb (109.8 kg)  Height: 6\' 1"  (1.854 m)    GEN- The patient is well appearing, alert and oriented x 3 today.   Head- normocephalic, atraumatic Eyes-  Sclera clear, conjunctiva pink Ears- hearing intact Oropharynx- clear Lungs-   normal work of breathing Chest- pacemaker pocket is well healed Heart- Regular rate and rhythm (paced) GI- soft, NT, ND, +  BS Extremities- no clubbing, cyanosis, or edema  Pacemaker interrogation- reviewed in detail today,  See PACEART report  ekg tracing ordered today is personally reviewed and shows sinus with V pacing  Assessment and Plan:  1. Symptomatic complete heart block Normal pacemaker function See Pace Art report No changes today he is device dependant today  2. Nonischemic CM Echo and myoview reviewed He has had marked improvement in symptoms with GDMT We discussed potential benefit and risks of upgrade of his pacemaker to CRT-P.  Given improvement clinically and risks of upgrade, he would prefer to continue medical therapy.  If symptoms worsen then he may be more willing to consider the procedure. Repeat echo prior to next visit  3. Hypertensive cardiovascular disease Improved with entresto  4. OSA Compliance with CPAP is advised  Risks, benefits and potential toxicities for medications prescribed and/or refilled reviewed with patient today.   Return in 6 months  [name] MD, [facility] 11/04/2019 1:31 PM

## 2019-11-04 NOTE — Patient Instructions (Addendum)
Medication Instructions:  Your physician recommends that you continue on your current medications as directed. Please refer to the Current Medication list given to you today.  *If you need a refill on your cardiac medications before your next appointment, please call your pharmacy*  Lab Work: None ordered.  If you have labs (blood work) drawn today and your tests are completely normal, you will receive your results only by: Marland Kitchen MyChart Message (if you have MyChart) OR . A paper copy in the mail If you have any lab test that is abnormal or we need to change your treatment, we will call you to review the results.  Testing/Procedures: Your physician has requested that you have an echocardiogram. Echocardiography is a painless test that uses sound waves to create images of your heart. It provides your doctor with information about the size and shape of your heart and how well your heart's chambers and valves are working. This procedure takes approximately one hour. There are no restrictions for this procedure.  Please schedule Echo in 5 months (Dec 2021)  Follow-Up: At [facility], you and your health needs are our priority.  As part of our continuing mission to provide you with exceptional heart care, we have created designated Provider Care Teams.  These Care Teams include your primary Cardiologist (physician) and Advanced Practice Providers (APPs -  Physician Assistants and Nurse Practitioners) who all work together to provide you with the care you need, when you need it.  We recommend signing up for the patient portal called "MyChart".  Sign up information is provided on this After Visit Summary.  MyChart is used to connect with patients for Virtual Visits (Telemedicine).  Patients are able to view lab/test results, encounter notes, upcoming appointments, etc.  Non-urgent messages can be sent to your provider as well.   To learn more about what you can do with MyChart, go to  [email or web address].    Your next appointment:   Your physician wants you to follow-up in: 6 months with Dr. [name].   Remote monitoring is used to monitor your Pacemaker from home. This monitoring reduces the number of office visits required to check your device to one time per year. It allows [place] to keep an eye on the functioning of your device to ensure it is working properly. You are scheduled for a device check from home on 11/30/2019. You may send your transmission at any time that day. If you have a wireless device, the transmission will be sent automatically. After your physician reviews your transmission, you will receive a postcard with your next transmission date.  Other Instructions:

## 2019-11-12 ENCOUNTER — Ambulatory Visit (INDEPENDENT_AMBULATORY_CARE_PROVIDER_SITE_OTHER): Payer: Medicare PPO | Admitting: Pharmacist

## 2019-11-12 ENCOUNTER — Other Ambulatory Visit: Payer: Self-pay

## 2019-11-12 VITALS — BP 155/90 | HR 68 | Wt 245.6 lb

## 2019-11-12 DIAGNOSIS — I5022 Chronic systolic (congestive) heart failure: Secondary | ICD-10-CM | POA: Diagnosis not present

## 2019-11-12 DIAGNOSIS — I442 Atrioventricular block, complete: Secondary | ICD-10-CM

## 2019-11-12 NOTE — Patient Instructions (Signed)
It was great meeting you today!  We would like to keep your blood pressure less than 130/80  Please continue your carvedilol 6.25 mg twice a day and your Entresto  I will contact you tomorrow with your bloodwork results and we will discuss increasing your Entresto dose  Start taking your blood pressure at home.  Preferably 1-2 hours after taking your blood pressure medications.  Remember to watch your sodium intake  Karren Cobble, PharmD, BCACP, [facility] [address] [place], [place] [address] Phone: [phone]; Fax: [phone] 11/12/2019 1:48 PM

## 2019-11-12 NOTE — Progress Notes (Signed)
Patient ID: Tony [name]                 DOB: [birth year]                      MRN: [id number]     HPI: Tony [name] is a 69 y.o. male referred by Oda Kilts for med titration. PMH is significant for complete heart block, aortic stenosis, HTN, CHF, OSA, T2DM, and hypothyroidism.  Patient was seen on 10/12/19 with concerns about his ejection fraction.  Reported frequent shortness of breath on minimal exertion.  Myoview 10/06/19 showed EF 28%.  Losartan and HCTZ were discontinued and patient was started on Entresto 26/26 BID.  Patient presents today in good spirits.  Reports he feels much better since starting the [facility] and has less SOB.  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 [place]  [place].  Daughter is an anesthesiologist at [employer].  Reports is not watching his diet as well as he should be because he does not frequently cook for himself at home since his wife passed away. Is tolerating medications well.  Has not been checking BP at home regularly but has machine and cuff.    Current CHF meds: Entresto 24-26, carvedilol 6.25 mg BID Previously tried: amlodipine 10 mg, HCTZ 25 mg, irbesartan 150 mg daily, losartan 25 mg, metoprolol tartrate 25 mg BID, metoprolol succinate 25 mg daily, nebivolol 5 mg daily, quinapril 20 mg daily, quinapril/hctz 20/25 daily BP goal: <130/80  Social History: No tobacco or alcohol  Diet: Frequently eats out.  Hot dogs at ball park, [place] Readings from Last 3 Encounters:  11/04/19 242 lb (109.8 kg)  10/12/19 243 lb (110.2 kg)  10/06/19 240 lb (108.9 kg)   BP Readings from Last 3 Encounters:  11/04/19 114/72  10/12/19 130/78  09/18/19 134/82   Pulse Readings from Last 3 Encounters:  11/04/19 72  10/12/19 97  09/18/19 82    Renal function: CrCl cannot be calculated (Patient's most recent lab result is older than the maximum 21 days allowed.).  Past Medical History:   Diagnosis Date  . Cancer ([place])    thyroid  . Complete heart block (HCC)    a. s/p MDT dual chamber PPM followed by Dr [name]   . Complication of anesthesia    1985 after Thyriodectomy hard time waking up  . Diabetes mellitus   . GERD (gastroesophageal reflux disease)   . Hypertension   . Hypothyroidism    Had two surgeries for Cancer    Current Outpatient Medications on File Prior to Visit  Medication Sig Dispense Refill  . acetaminophen (TYLENOL) 500 MG tablet Take 500 mg by mouth every 6 (six) hours as needed (pain).     Marland Kitchen aspirin EC 81 MG tablet Take 81 mg by mouth daily.    Marland Kitchen atorvastatin (LIPITOR) 10 MG tablet Take 10 mg by mouth daily.    . carvedilol (COREG) 6.25 MG tablet Take 1 tablet (6.25 mg total) by mouth 2 (two) times daily. 180 tablet 3  . Cholecalciferol (VITAMIN D3) 25 MCG (1000 UT) CAPS Take by mouth daily.    . cyclobenzaprine (FLEXERIL) 10 MG tablet Take 10 mg by mouth 3 (three) times daily as needed. For muscle pain.    Marland Kitchen gabapentin (NEURONTIN) 100 MG capsule Take 2 capsules (200 mg total) by mouth at bedtime. 60 capsule  2  . glipiZIDE (GLUCOTROL) 10 MG tablet Take 10 mg by mouth daily.     Marland Kitchen ibuprofen (ADVIL,MOTRIN) 800 MG tablet Take 800 mg by mouth every 8 (eight) hours as needed. For pain.    . INVOKAMET XR [phone] MG TB24 Take 2 tablets by mouth daily.     Marland Kitchen levothyroxine (SYNTHROID, LEVOTHROID) 175 MCG tablet Take 175 mcg by mouth daily before breakfast.     . Multiple Vitamins-Minerals (SENIOR MULTIVITAMIN PLUS PO) Take by mouth daily.    . mupirocin ointment (BACTROBAN) 2 % Apply 1 application topically as needed.     Marland Kitchen omeprazole (PRILOSEC) 20 MG capsule Take 20 mg by mouth daily as needed. For acid reflux    . potassium chloride 20 MEQ TBCR Take 20 mEq by mouth daily. 90 tablet 3  . sacubitril-valsartan (ENTRESTO) 24-26 MG Take 1 tablet by mouth 2 (two) times daily. 180 tablet 3  . sertraline (ZOLOFT) 100 MG tablet Take 200 mg by mouth daily.     .  Tamsulosin HCl (FLOMAX) 0.4 MG CAPS Take 1 capsule (0.4 mg total) by mouth every morning. 30 capsule 0  . TRULICITY 3 SR/1.5XY SOPN once a week.    . Vitamin D, Ergocalciferol, (DRISDOL) 1.25 MG (50000 UNIT) CAPS capsule 50,000 Units once a week.      No current facility-administered medications on file prior to visit.    Allergies  Allergen Reactions  . Corticosteroids   . Crestor [Rosuvastatin Calcium] Other (See Comments)    Leg pain  . Simvastatin Other (See Comments)    Leg pain     Assessment/Plan:  1. CHF - Patient BP today elevated at 155/90 which is above goal of <130/80.  Unknown what patient's home BP readings are and he reports no white coat HTN.  Recommended patient begin checking his BP at home 1-2 hours after taking blood pressure medications.  Recommended to watch diet and to concentrate on vegetables and lean proteins.  Had BMP drawn today and if bloodwork normal, will increase Entresto to 49-51.  Patient understands and is agreeable to plan.  Recheck scheduled for further med titration in 3 weeks.  Karren Cobble, PharmD, BCACP, [facility] [address] [place], [place] [address] Phone: [phone]; Fax: [phone] 11/12/2019 2:07 PM

## 2019-11-13 ENCOUNTER — Telehealth: Payer: Self-pay | Admitting: Pharmacist

## 2019-11-13 DIAGNOSIS — I5022 Chronic systolic (congestive) heart failure: Secondary | ICD-10-CM

## 2019-11-13 LAB — BASIC METABOLIC PANEL
BUN/Creatinine Ratio: 12 (ref 10–24)
BUN: 14 mg/dL (ref 8–27)
CO2: 22 mmol/L (ref 20–29)
Calcium: 9.1 mg/dL (ref 8.6–10.2)
Chloride: 103 mmol/L (ref 96–106)
Creatinine, Ser: 1.17 mg/dL (ref 0.76–1.27)
GFR calc Af Amer: 73 mL/min/{1.73_m2} (ref >59)
GFR calc non Af Amer: 63 mL/min/{1.73_m2} (ref >59)
Glucose: 151 mg/dL — ABNORMAL HIGH (ref 65–99)
Potassium: 4.1 mmol/L (ref 3.5–5.2)
Sodium: 139 mmol/L (ref 134–144)

## 2019-11-13 MED ORDER — SACUBITRIL-VALSARTAN 49-51 MG PO TABS: 1.0000 | ORAL_TABLET | Freq: Two times a day (BID) | ORAL | 1 refills | Status: DC

## 2019-11-13 NOTE — Telephone Encounter (Signed)
Spoke with patient over the phone and relayed bloodwork results WNL.  Patient agreeable to increasing Entresto.  Took BP at home last night and got 155/90 so he took another carvedilol this morning.  Advised that Entresto increase should help with blood pressure and to continue to monitor and if it does not decrease, to call and we can increase his carvedilol dose.  Patient voiced understanding.

## 2019-11-25 ENCOUNTER — Other Ambulatory Visit (HOSPITAL_COMMUNITY): Payer: Medicare PPO

## 2019-11-27 DIAGNOSIS — Z794 Long term (current) use of insulin: Secondary | ICD-10-CM | POA: Diagnosis not present

## 2019-11-27 DIAGNOSIS — E039 Hypothyroidism, unspecified: Secondary | ICD-10-CM | POA: Diagnosis not present

## 2019-11-27 DIAGNOSIS — E1165 Type 2 diabetes mellitus with hyperglycemia: Secondary | ICD-10-CM | POA: Diagnosis not present

## 2019-11-27 DIAGNOSIS — E669 Obesity, unspecified: Secondary | ICD-10-CM | POA: Diagnosis not present

## 2019-11-30 ENCOUNTER — Ambulatory Visit (INDEPENDENT_AMBULATORY_CARE_PROVIDER_SITE_OTHER): Payer: Medicare PPO | Admitting: *Deleted

## 2019-11-30 DIAGNOSIS — I442 Atrioventricular block, complete: Secondary | ICD-10-CM

## 2019-12-01 ENCOUNTER — Ambulatory Visit: Payer: Medicare PPO

## 2019-12-01 LAB — CUP PACEART REMOTE DEVICE CHECK
Battery Impedance: 449 Ohm
Battery Remaining Longevity: 88 mo
Battery Voltage: 2.78 V
Brady Statistic AP VP Percent: 17 %
Brady Statistic AP VS Percent: 0 %
Brady Statistic AS VP Percent: 82 %
Brady Statistic AS VS Percent: 1 %
Date Time Interrogation Session: 20210803150628
Implantable Lead Implant Date: 20160413
Implantable Lead Implant Date: 20160413
Implantable Lead Location: 753859
Implantable Lead Location: 753860
Implantable Lead Model: 5076
Implantable Lead Model: 5076
Implantable Pulse Generator Implant Date: 20160413
Lead Channel Impedance Value: 467 Ohm
Lead Channel Impedance Value: 573 Ohm
Lead Channel Pacing Threshold Amplitude: 0.375 V
Lead Channel Pacing Threshold Amplitude: 0.75 V
Lead Channel Pacing Threshold Pulse Width: 0.4 ms
Lead Channel Pacing Threshold Pulse Width: 0.4 ms
Lead Channel Setting Pacing Amplitude: 2 V
Lead Channel Setting Pacing Amplitude: 2.5 V
Lead Channel Setting Pacing Pulse Width: 0.4 ms
Lead Channel Setting Sensing Sensitivity: 4 mV

## 2019-12-01 NOTE — Progress Notes (Deleted)
Patient ID: [name]                 DOB: May 24, 1950                      MRN: [id number]     HPI: Tony [name] is a 69 y.o. male referred by Oda Kilts, PA for CHF medication optimization. PMH is significant for complete heart block, aortic stenosis, HTN, CHF, OSA, T2DM, and hypothyroidism. Patient was seen on 10/12/19 with concerns about his ejection fraction.  Reported frequent shortness of breath on minimal exertion.  Myoview 10/06/19 showed EF 28%.  Losartan and HCTZ were discontinued and patient was started on Entresto 24-26 BID. He was seen for follow up on 7/15 and BP remained elevated at 155/90. His Entresto was increased to 49-51mg  BID and he presents today for follow up.  bmet today Diet changes? Home BP readings? Inc entresto again [employer] or add spiro  Patient presents today in good spirits.  Reports he feels much better since starting the [facility] and has less SOB.  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 Is  [place].  Daughter is an anesthesiologist at [employer].  Reports is not watching his diet as well as he should be because he does not frequently cook for himself at home since his wife passed away. Is tolerating medications well.  Has not been checking BP at home regularly but has machine and cuff.    Current CHF meds: Entresto 49-51mg  BID, carvedilol 6.25 mg BID Previously tried: amlodipine 10 mg, HCTZ 25 mg, irbesartan 150 mg daily, losartan 25 mg, metoprolol tartrate 25 mg BID, metoprolol succinate 25 mg daily, nebivolol 5 mg daily, quinapril 20 mg daily, quinapril/hctz 20/25 daily BP goal: <130/62mmHg  Social History: No tobacco or alcohol  Diet: Frequently eats out.  Hot dogs at ball park, [place] Readings from Last 3 Encounters:  11/12/19 245 lb 9.6 oz (111.4 kg)  11/04/19 242 lb (109.8 kg)  10/12/19 243 lb (110.2 kg)   BP Readings from Last 3 Encounters:  11/12/19 (!) 155/90    11/04/19 114/72  10/12/19 130/78   Pulse Readings from Last 3 Encounters:  11/12/19 68  11/04/19 72  10/12/19 97    Renal function: CrCl cannot be calculated (Unknown ideal weight.).  Past Medical History:  Diagnosis Date   Cancer [facility])    thyroid   Complete heart block ([place])    a. s/p MDT dual chamber PPM followed by Dr [name]    Complication of anesthesia    1985 after Thyriodectomy hard time waking up   Diabetes mellitus    GERD (gastroesophageal reflux disease)    Hypertension    Hypothyroidism    Had two surgeries for Cancer    Current Outpatient Medications on File Prior to Visit  Medication Sig Dispense Refill   acetaminophen (TYLENOL) 500 MG tablet Take 500 mg by mouth every 6 (six) hours as needed (pain).      aspirin EC 81 MG tablet Take 81 mg by mouth daily.     atorvastatin (LIPITOR) 10 MG tablet Take 10 mg by mouth daily.     carvedilol (COREG) 6.25 MG tablet Take 1 tablet (6.25 mg total) by mouth 2 (two) times daily. 180 tablet 3   Chlorpheniramine-DM (CORICIDIN HBP COUGH/COLD PO) Take by mouth.     cyclobenzaprine (FLEXERIL) 10 MG tablet  Take 10 mg by mouth 3 (three) times daily as needed. For muscle pain.     gabapentin (NEURONTIN) 100 MG capsule Take 2 capsules (200 mg total) by mouth at bedtime. 60 capsule 2   glipiZIDE (GLUCOTROL) 10 MG tablet Take 10 mg by mouth daily.      ibuprofen (ADVIL,MOTRIN) 800 MG tablet Take 800 mg by mouth every 8 (eight) hours as needed. For pain.     INVOKAMET XR [phone] MG TB24 Take 2 tablets by mouth daily.      levothyroxine (SYNTHROID, LEVOTHROID) 175 MCG tablet Take 175 mcg by mouth daily before breakfast.      Multiple Vitamins-Minerals (SENIOR MULTIVITAMIN PLUS PO) Take by mouth daily.     mupirocin ointment (BACTROBAN) 2 % Apply 1 application topically as needed.      omeprazole (PRILOSEC) 20 MG capsule Take 20 mg by mouth daily as needed. For acid reflux     potassium chloride 20 MEQ TBCR  Take 20 mEq by mouth daily. 90 tablet 3   sacubitril-valsartan (ENTRESTO) 49-51 MG Take 1 tablet by mouth 2 (two) times daily. 60 tablet 1   sertraline (ZOLOFT) 100 MG tablet Take 200 mg by mouth daily.      Tamsulosin HCl (FLOMAX) 0.4 MG CAPS Take 1 capsule (0.4 mg total) by mouth every morning. 30 capsule 0   TRULICITY 3 BZ/1.6RC SOPN once a week.     No current facility-administered medications on file prior to visit.    Allergies  Allergen Reactions   Corticosteroids    Crestor [Rosuvastatin Calcium] Other (See Comments)    Leg pain   Simvastatin Other (See Comments)    Leg pain     Assessment/Plan:  1. CHF medication optimization -  Luan Urbani E. Maki Hege, PharmD, BCACP, [facility] [address] [place], [place] [address] Phone: [phone]; Fax: [phone] 12/01/2019 8:07 AM

## 2019-12-03 ENCOUNTER — Telehealth: Payer: Self-pay

## 2019-12-03 ENCOUNTER — Ambulatory Visit: Payer: Medicare PPO

## 2019-12-03 DIAGNOSIS — I5022 Chronic systolic (congestive) heart failure: Secondary | ICD-10-CM

## 2019-12-03 NOTE — Progress Notes (Deleted)
Patient ID: Tony [name]                 DOB: Oct 09, 1950                      MRN: [id number]     HPI: Tony [name] is a 69 y.o. male referred by Dr. Rayann [name] to CHF clinic. PMH is significant for HTN, CHF, complete heart brock, OSA, T2DM, and hypothyroidism.  Current HTN meds: Entresto 49/51, carvedilol 6.25 mg BID Previously tried: amlodipine 10 mg, HCTZ 25 mg, irbesartan 150 mg daily, losartan 25 mg, metoprolol tartrate 25 mg BID, metoprolol succinate 25 mg daily, nebivolol 5 mg daily, quinapril 20 mg daily, quinapril/hctz 20/25 daily BP goal: <130/80  Family History:   Social History:   Diet:   Exercise:   Home BP readings:   Wt Readings from Last 3 Encounters:  11/12/19 245 lb 9.6 oz (111.4 kg)  11/04/19 242 lb (109.8 kg)  10/12/19 243 lb (110.2 kg)   BP Readings from Last 3 Encounters:  11/12/19 (!) 155/90  11/04/19 114/72  10/12/19 130/78   Pulse Readings from Last 3 Encounters:  11/12/19 68  11/04/19 72  10/12/19 97    Renal function: CrCl cannot be calculated (Unknown ideal weight.).  Past Medical History:  Diagnosis Date  . Cancer ([place])    thyroid  . Complete heart block ([facility])    a. s/p MDT dual chamber PPM followed by Dr Tony [name]   . Complication of anesthesia    1985 after Thyriodectomy hard time waking up  . Diabetes mellitus   . GERD (gastroesophageal reflux disease)   . Hypertension   . Hypothyroidism    Had two surgeries for Cancer    Current Outpatient Medications on File Prior to Visit  Medication Sig Dispense Refill  . acetaminophen (TYLENOL) 500 MG tablet Take 500 mg by mouth every 6 (six) hours as needed (pain).     Marland Kitchen aspirin EC 81 MG tablet Take 81 mg by mouth daily.    Marland Kitchen atorvastatin (LIPITOR) 10 MG tablet Take 10 mg by mouth daily.    . carvedilol (COREG) 6.25 MG tablet Take 1 tablet (6.25 mg total) by mouth 2 (two) times daily. 180 tablet 3  . Chlorpheniramine-DM (CORICIDIN HBP COUGH/COLD PO) Take by mouth.    . cyclobenzaprine  (FLEXERIL) 10 MG tablet Take 10 mg by mouth 3 (three) times daily as needed. For muscle pain.    Marland Kitchen gabapentin (NEURONTIN) 100 MG capsule Take 2 capsules (200 mg total) by mouth at bedtime. 60 capsule 2  . glipiZIDE (GLUCOTROL) 10 MG tablet Take 10 mg by mouth daily.     Marland Kitchen ibuprofen (ADVIL,MOTRIN) 800 MG tablet Take 800 mg by mouth every 8 (eight) hours as needed. For pain.    . INVOKAMET XR [id number] MG TB24 Take 2 tablets by mouth daily.     Marland Kitchen levothyroxine (SYNTHROID, LEVOTHROID) 175 MCG tablet Take 175 mcg by mouth daily before breakfast.     . Multiple Vitamins-Minerals (SENIOR MULTIVITAMIN PLUS PO) Take by mouth daily.    . mupirocin ointment (BACTROBAN) 2 % Apply 1 application topically as needed.     Marland Kitchen omeprazole (PRILOSEC) 20 MG capsule Take 20 mg by mouth daily as needed. For acid reflux    . potassium chloride 20 MEQ TBCR Take 20 mEq by mouth daily. 90 tablet 3  . sacubitril-valsartan (ENTRESTO) 49-51 MG Take 1 tablet by mouth 2 (two) times daily. 60 tablet  1  . sertraline (ZOLOFT) 100 MG tablet Take 200 mg by mouth daily.     . Tamsulosin HCl (FLOMAX) 0.4 MG CAPS Take 1 capsule (0.4 mg total) by mouth every morning. 30 capsule 0  . TRULICITY 3 VB/1.6OM SOPN once a week.     No current facility-administered medications on file prior to visit.    Allergies  Allergen Reactions  . Corticosteroids   . Crestor [Rosuvastatin Calcium] Other (See Comments)    Leg pain  . Simvastatin Other (See Comments)    Leg pain     Assessment/Plan:  1. Hypertension -

## 2019-12-03 NOTE — Telephone Encounter (Signed)
Called and lmomed the pt that they missed their appt and that they need to complete labs. Karren Cobble pharmd stated that the labs are more vital and that was stressed. Orders placed and will await the pt callback

## 2019-12-03 NOTE — Progress Notes (Signed)
Remote pacemaker transmission.   

## 2019-12-17 ENCOUNTER — Other Ambulatory Visit: Payer: Medicare PPO | Admitting: *Deleted

## 2019-12-17 ENCOUNTER — Other Ambulatory Visit: Payer: Self-pay

## 2019-12-17 DIAGNOSIS — I5022 Chronic systolic (congestive) heart failure: Secondary | ICD-10-CM | POA: Diagnosis not present

## 2019-12-17 DIAGNOSIS — R7989 Other specified abnormal findings of blood chemistry: Secondary | ICD-10-CM | POA: Diagnosis not present

## 2019-12-17 LAB — BASIC METABOLIC PANEL
BUN/Creatinine Ratio: 14 (ref 10–24)
BUN: 15 mg/dL (ref 8–27)
CO2: 22 mmol/L (ref 20–29)
Calcium: 9 mg/dL (ref 8.6–10.2)
Chloride: 101 mmol/L (ref 96–106)
Creatinine, Ser: 1.08 mg/dL (ref 0.76–1.27)
GFR calc Af Amer: 80 mL/min/{1.73_m2} (ref >59)
GFR calc non Af Amer: 69 mL/min/{1.73_m2} (ref >59)
Glucose: 243 mg/dL — ABNORMAL HIGH (ref 65–99)
Potassium: 3.9 mmol/L (ref 3.5–5.2)
Sodium: 137 mmol/L (ref 134–144)

## 2019-12-21 ENCOUNTER — Telehealth: Payer: Self-pay

## 2019-12-21 NOTE — Telephone Encounter (Signed)
Pt is aware and agreeable to lab results. He was inquiring about whether or not he is supposed to be up titrated to the highest dose of Entresto or if he is to remain on his current dose. After confirming with Oda Kilts, PA-C pt is to remain on current dose of Entresto 49-51 mg until his follow up appt on 10/20 with Dr. [name] and then he can discuss with Dr. [name]. Pt is aware and agreeable.  Pt did also notify me that when he was at work over the weekend he was walking up a ramp and had a brief episode of shortness of breath. He states it could've been dehydration but wanted to inform us anyway. I informed the patient that if he has and continues episodes of SOB that are atypical for him to call and let the office know. He is aware and agreeable.

## 2019-12-29 DIAGNOSIS — M940 Chondrocostal junction syndrome [Tietze]: Secondary | ICD-10-CM | POA: Diagnosis not present

## 2020-01-20 ENCOUNTER — Other Ambulatory Visit: Payer: Self-pay | Admitting: Student

## 2020-01-20 DIAGNOSIS — I5022 Chronic systolic (congestive) heart failure: Secondary | ICD-10-CM

## 2020-02-17 ENCOUNTER — Encounter: Payer: Medicare PPO | Admitting: Internal Medicine

## 2020-02-26 ENCOUNTER — Other Ambulatory Visit: Payer: Self-pay | Admitting: Student

## 2020-02-26 DIAGNOSIS — I5022 Chronic systolic (congestive) heart failure: Secondary | ICD-10-CM

## 2020-03-01 DIAGNOSIS — Z79899 Other long term (current) drug therapy: Secondary | ICD-10-CM | POA: Diagnosis not present

## 2020-03-01 DIAGNOSIS — K760 Fatty (change of) liver, not elsewhere classified: Secondary | ICD-10-CM | POA: Diagnosis not present

## 2020-03-01 DIAGNOSIS — Z23 Encounter for immunization: Secondary | ICD-10-CM | POA: Diagnosis not present

## 2020-03-01 DIAGNOSIS — E1121 Type 2 diabetes mellitus with diabetic nephropathy: Secondary | ICD-10-CM | POA: Diagnosis not present

## 2020-03-01 DIAGNOSIS — E039 Hypothyroidism, unspecified: Secondary | ICD-10-CM | POA: Diagnosis not present

## 2020-03-01 DIAGNOSIS — K219 Gastro-esophageal reflux disease without esophagitis: Secondary | ICD-10-CM | POA: Diagnosis not present

## 2020-03-01 DIAGNOSIS — G4733 Obstructive sleep apnea (adult) (pediatric): Secondary | ICD-10-CM | POA: Diagnosis not present

## 2020-03-01 DIAGNOSIS — Z0000 Encounter for general adult medical examination without abnormal findings: Secondary | ICD-10-CM | POA: Diagnosis not present

## 2020-03-01 DIAGNOSIS — I10 Essential (primary) hypertension: Secondary | ICD-10-CM | POA: Diagnosis not present

## 2020-03-02 DIAGNOSIS — E1121 Type 2 diabetes mellitus with diabetic nephropathy: Secondary | ICD-10-CM | POA: Diagnosis not present

## 2020-03-02 DIAGNOSIS — Z0000 Encounter for general adult medical examination without abnormal findings: Secondary | ICD-10-CM | POA: Diagnosis not present

## 2020-03-03 ENCOUNTER — Other Ambulatory Visit: Payer: Self-pay | Admitting: Student

## 2020-03-03 DIAGNOSIS — I5022 Chronic systolic (congestive) heart failure: Secondary | ICD-10-CM

## 2020-03-07 ENCOUNTER — Other Ambulatory Visit: Payer: Self-pay

## 2020-03-07 DIAGNOSIS — I5022 Chronic systolic (congestive) heart failure: Secondary | ICD-10-CM

## 2020-03-07 MED ORDER — ENTRESTO 49-51 MG PO TABS: 1.0000 | ORAL_TABLET | Freq: Two times a day (BID) | ORAL | 8 refills | Status: DC

## 2020-03-07 NOTE — Telephone Encounter (Signed)
Pt's medication was sent to pt's pharmacy as requested. Confirmation received.  °

## 2020-03-11 ENCOUNTER — Ambulatory Visit (INDEPENDENT_AMBULATORY_CARE_PROVIDER_SITE_OTHER): Payer: Medicare PPO

## 2020-03-11 DIAGNOSIS — I442 Atrioventricular block, complete: Secondary | ICD-10-CM

## 2020-03-11 LAB — CUP PACEART REMOTE DEVICE CHECK
Battery Impedance: 523 Ohm
Battery Remaining Longevity: 80 mo
Battery Voltage: 2.78 V
Brady Statistic AP VP Percent: 24 %
Brady Statistic AP VS Percent: 0 %
Brady Statistic AS VP Percent: 75 %
Brady Statistic AS VS Percent: 0 %
Date Time Interrogation Session: 20211111172304
Implantable Lead Implant Date: 20160413
Implantable Lead Implant Date: 20160413
Implantable Lead Location: 753859
Implantable Lead Location: 753860
Implantable Lead Model: 5076
Implantable Lead Model: 5076
Implantable Pulse Generator Implant Date: 20160413
Lead Channel Impedance Value: 408 Ohm
Lead Channel Impedance Value: 536 Ohm
Lead Channel Pacing Threshold Amplitude: 0.5 V
Lead Channel Pacing Threshold Amplitude: 0.625 V
Lead Channel Pacing Threshold Pulse Width: 0.4 ms
Lead Channel Pacing Threshold Pulse Width: 0.4 ms
Lead Channel Setting Pacing Amplitude: 2 V
Lead Channel Setting Pacing Amplitude: 2.5 V
Lead Channel Setting Pacing Pulse Width: 0.4 ms
Lead Channel Setting Sensing Sensitivity: 4 mV

## 2020-03-14 NOTE — Progress Notes (Signed)
Remote pacemaker transmission.   

## 2020-04-14 ENCOUNTER — Other Ambulatory Visit: Payer: Self-pay

## 2020-04-14 ENCOUNTER — Ambulatory Visit (HOSPITAL_COMMUNITY): Payer: Medicare PPO | Attending: Cardiology

## 2020-04-14 DIAGNOSIS — I442 Atrioventricular block, complete: Secondary | ICD-10-CM | POA: Diagnosis not present

## 2020-04-14 DIAGNOSIS — I10 Essential (primary) hypertension: Secondary | ICD-10-CM | POA: Diagnosis not present

## 2020-04-14 DIAGNOSIS — Z950 Presence of cardiac pacemaker: Secondary | ICD-10-CM | POA: Insufficient documentation

## 2020-04-14 DIAGNOSIS — I5022 Chronic systolic (congestive) heart failure: Secondary | ICD-10-CM

## 2020-04-14 DIAGNOSIS — I350 Nonrheumatic aortic (valve) stenosis: Secondary | ICD-10-CM | POA: Diagnosis not present

## 2020-04-14 LAB — ECHOCARDIOGRAM COMPLETE
AR max vel: 1.3 cm2
AV Area VTI: 1.39 cm2
AV Area mean vel: 1.31 cm2
AV Mean grad: 31.6 mmHg
AV Peak grad: 48.2 mmHg
Ao pk vel: 3.47 m/s
Area-P 1/2: 3.37 cm2
S' Lateral: 3.1 cm

## 2020-04-14 MED ORDER — PERFLUTREN LIPID MICROSPHERE
1.0000 mL | INTRAVENOUS | Status: AC | PRN
  Administered 2020-04-14: 2 mL via INTRAVENOUS

## 2020-05-02 ENCOUNTER — Telehealth: Payer: Self-pay

## 2020-05-02 ENCOUNTER — Encounter: Payer: Self-pay | Admitting: Internal Medicine

## 2020-05-02 ENCOUNTER — Ambulatory Visit: Payer: Medicare PPO | Admitting: Internal Medicine

## 2020-05-02 ENCOUNTER — Other Ambulatory Visit: Payer: Self-pay

## 2020-05-02 VITALS — BP 134/78 | HR 78 | Ht 73.0 in | Wt 242.6 lb

## 2020-05-02 DIAGNOSIS — I5022 Chronic systolic (congestive) heart failure: Secondary | ICD-10-CM | POA: Diagnosis not present

## 2020-05-02 DIAGNOSIS — I442 Atrioventricular block, complete: Secondary | ICD-10-CM

## 2020-05-02 DIAGNOSIS — I428 Other cardiomyopathies: Secondary | ICD-10-CM

## 2020-05-02 DIAGNOSIS — I350 Nonrheumatic aortic (valve) stenosis: Secondary | ICD-10-CM

## 2020-05-02 DIAGNOSIS — I10 Essential (primary) hypertension: Secondary | ICD-10-CM | POA: Diagnosis not present

## 2020-05-02 NOTE — Progress Notes (Signed)
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 male who presents today for routine electrophysiology followup.  Since last being seen in our clinic, the patient reports doing very well. He has SOB with moderate activity but is mostly without symptoms.   Today, he denies symptoms of palpitations, chest pain,   lower extremity edema, dizziness, presyncope, or syncope. His daughter is an Designer, industrial/product at [employer].  The patient is otherwise without complaint today.   Past Medical History:  Diagnosis Date  . Cancer ([facility])    thyroid  . Complete heart block ([facility])    a. s/p MDT dual chamber PPM followed by Dr Johney Frame   . Complication of anesthesia    1985 after Thyriodectomy hard time waking up  . Diabetes mellitus   . GERD (gastroesophageal reflux disease)   . Hypertension   . Hypothyroidism    Had two surgeries for Cancer   Past Surgical History:  Procedure Laterality Date  . LUMBAR LAMINECTOMY/DECOMPRESSION MICRODISCECTOMY  07/19/2011   Procedure: LUMBAR LAMINECTOMY/DECOMPRESSION MICRODISCECTOMY 3 LEVELS;  Surgeon: Venita Lick, MD;  Location: [facility] OR;  Service: Orthopedics;  Laterality: Left;  Lumbar three-Lumbar five LEFT DECOMPRESSION AND FORAMINOTOMY Lumbar three-four LEFT DISCECTOMY  . PERMANENT PACEMAKER INSERTION N/A 08/11/2014   MDT Adapta L implanted by Dr Johney Frame for CHB  . Thyroidectomy x2      ROS- all systems are reviewed and negative except as per HPI above  Current Outpatient Medications  Medication Sig Dispense Refill  . acetaminophen (TYLENOL) 500 MG tablet Take 500 mg by mouth every 6 (six) hours as needed (pain).     Marland Kitchen aspirin EC 81 MG tablet Take 81 mg by mouth daily.    Marland Kitchen atorvastatin (LIPITOR) 10 MG tablet Take 10 mg by mouth daily.    . carvedilol (COREG) 6.25 MG tablet Take 1 tablet (6.25 mg total) by mouth 2 (two) times daily. 180 tablet 3  . Chlorpheniramine-DM (CORICIDIN HBP COUGH/COLD PO) Take by  mouth.    . cyclobenzaprine (FLEXERIL) 10 MG tablet Take 10 mg by mouth 3 (three) times daily as needed. For muscle pain.    Marland Kitchen gabapentin (NEURONTIN) 100 MG capsule Take 2 capsules (200 mg total) by mouth at bedtime. (Patient taking differently: Take 100 mg by mouth at bedtime.) 60 capsule 2  . glipiZIDE (GLUCOTROL) 10 MG tablet Take 10 mg by mouth daily.     Marland Kitchen ibuprofen (ADVIL,MOTRIN) 800 MG tablet Take 800 mg by mouth every 8 (eight) hours as needed. For pain.    . INVOKAMET XR [phone] MG TB24 Take 2 tablets by mouth daily.     Marland Kitchen levothyroxine (SYNTHROID, LEVOTHROID) 175 MCG tablet Take 175 mcg by mouth daily before breakfast.     . Multiple Vitamins-Minerals (SENIOR MULTIVITAMIN PLUS PO) Take by mouth daily.    . mupirocin ointment (BACTROBAN) 2 % Apply 1 application topically as needed.     Marland Kitchen omeprazole (PRILOSEC) 20 MG capsule Take 20 mg by mouth daily as needed. For acid reflux    . potassium chloride 20 MEQ TBCR Take 20 mEq by mouth daily. 90 tablet 3  . sacubitril-valsartan (ENTRESTO) 49-51 MG Take 1 tablet by mouth 2 (two) times daily. 60 tablet 8  . sertraline (ZOLOFT) 100 MG tablet Take 200 mg by mouth daily.     . Tamsulosin HCl (FLOMAX) 0.4 MG CAPS Take 1 capsule (0.4 mg total) by mouth every morning. 30 capsule  0  . TRULICITY 3 MG/0.5ML SOPN once a week.     No current facility-administered medications for this visit.    Physical Exam: Vitals:   05/02/20 0909  BP: 134/78  Pulse: 78  SpO2: 97%  Weight: 242 lb 9.6 oz (110 kg)  Height: 6\' 1"  (1.854 m)    GEN- The patient is well appearing, alert and oriented x 3 today.   Head- normocephalic, atraumatic Eyes-  Sclera clear, conjunctiva pink Ears- hearing intact Oropharynx- clear Lungs- Clear to ausculation bilaterally, normal work of breathing Chest- pacemaker pocket is well healed Heart- Regular rate and rhythm, no murmurs, rubs or gallops, PMI not laterally displaced GI- soft, NT, ND, + BS Extremities- no clubbing,  cyanosis, or edema  Pacemaker interrogation- remotes reviewed in detail today,  See PACEART report  ekg tracing ordered today is personally reviewed and shows sinus with V pacing  Assessment and Plan:  1. Symptomatic complete heart block Normal pacemaker function by remotes (up to date) See Art report No changes today he is device dependant today  2. Nonischemic CM EF 40-45% We discussed at length option of upgrade to CRT-P.  Given risks involved and the fact that he is doing quite well, he would prefer to continue medical therapy.  We will follow him closely and consider upgrade with any substantial decline.  3. OSA Compliance with CPAP is advised  4. AS Moderate by recent echo (reviewed)  Risks, benefits and potential toxicities for medications prescribed and/or refilled reviewed with patient today.   Return to see EP PA every 6 months  Arita Miss MD, [facility] 05/02/2020 9:37 AM
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
## 2020-05-02 NOTE — Patient Instructions (Addendum)
Medication Instructions:  Your physician recommends that you continue on your current medications as directed. Please refer to the Current Medication list given to you today.  *If you need a refill on your cardiac medications before your next appointment, please call your pharmacy*  Lab Work: None ordered.  If you have labs (blood work) drawn today and your tests are completely normal, you will receive your results only by: Marland Kitchen MyChart Message (if you have MyChart) OR . A paper copy in the mail If you have any lab test that is abnormal or we need to change your treatment, we will call you to review the results.  Testing/Procedures: None ordered.  Follow-Up: At [facility], you and your health needs are our priority.  As part of our continuing mission to provide you with exceptional heart care, we have created designated Provider Care Teams.  These Care Teams include your primary Cardiologist (physician) and Advanced Practice Providers (APPs -  Physician Assistants and Nurse Practitioners) who all work together to provide you with the care you need, when you need it.  We recommend signing up for the patient portal called "MyChart".  Sign up information is provided on this After Visit Summary.  MyChart is used to connect with patients for Virtual Visits (Telemedicine).  Patients are able to view lab/test results, encounter notes, upcoming appointments, etc.  Non-urgent messages can be sent to your provider as well.   To learn more about what you can do with MyChart, go to [email or web address].    Your next appointment:   Your physician wants you to follow-up in: 6 months with Tony [name]. You will receive a reminder letter in the mail two months in advance. If you don't receive a letter, please call our office to schedule the follow-up appointment.  Remote monitoring is used to monitor your Pacemaker from home. This monitoring reduces the number of office visits required to check your  device to one time per year. It allows [place] to keep an eye on the functioning of your device to ensure it is working properly. You are scheduled for a device check from home on 06/10/20. You may send your transmission at any time that day. If you have a wireless device, the transmission will be sent automatically. After your physician reviews your transmission, you will receive a postcard with your next transmission date.  Other Instructions:

## 2020-05-27 DIAGNOSIS — C44311 Basal cell carcinoma of skin of nose: Secondary | ICD-10-CM | POA: Diagnosis not present

## 2020-05-30 DIAGNOSIS — Z794 Long term (current) use of insulin: Secondary | ICD-10-CM | POA: Diagnosis not present

## 2020-05-30 DIAGNOSIS — R635 Abnormal weight gain: Secondary | ICD-10-CM | POA: Diagnosis not present

## 2020-05-30 DIAGNOSIS — R197 Diarrhea, unspecified: Secondary | ICD-10-CM | POA: Diagnosis not present

## 2020-05-30 DIAGNOSIS — E039 Hypothyroidism, unspecified: Secondary | ICD-10-CM | POA: Diagnosis not present

## 2020-05-30 DIAGNOSIS — E1165 Type 2 diabetes mellitus with hyperglycemia: Secondary | ICD-10-CM | POA: Diagnosis not present

## 2020-05-30 DIAGNOSIS — E669 Obesity, unspecified: Secondary | ICD-10-CM | POA: Diagnosis not present

## 2020-06-10 ENCOUNTER — Ambulatory Visit (INDEPENDENT_AMBULATORY_CARE_PROVIDER_SITE_OTHER): Payer: Medicare PPO

## 2020-06-10 DIAGNOSIS — I442 Atrioventricular block, complete: Secondary | ICD-10-CM

## 2020-06-12 LAB — CUP PACEART REMOTE DEVICE CHECK
Battery Impedance: 572 Ohm
Battery Remaining Longevity: 77 mo
Battery Voltage: 2.78 V
Brady Statistic AP VP Percent: 25 %
Brady Statistic AP VS Percent: 0 %
Brady Statistic AS VP Percent: 74 %
Brady Statistic AS VS Percent: 1 %
Date Time Interrogation Session: 20220211115655
Implantable Lead Implant Date: 20160413
Implantable Lead Implant Date: 20160413
Implantable Lead Location: 753859
Implantable Lead Location: 753860
Implantable Lead Model: 5076
Implantable Lead Model: 5076
Implantable Pulse Generator Implant Date: 20160413
Lead Channel Impedance Value: 448 Ohm
Lead Channel Impedance Value: 541 Ohm
Lead Channel Pacing Threshold Amplitude: 0.5 V
Lead Channel Pacing Threshold Amplitude: 0.75 V
Lead Channel Pacing Threshold Pulse Width: 0.4 ms
Lead Channel Pacing Threshold Pulse Width: 0.4 ms
Lead Channel Setting Pacing Amplitude: 2 V
Lead Channel Setting Pacing Amplitude: 2.5 V
Lead Channel Setting Pacing Pulse Width: 0.4 ms
Lead Channel Setting Sensing Sensitivity: 4 mV

## 2020-06-15 NOTE — Progress Notes (Signed)
Remote pacemaker transmission.   

## 2020-07-08 ENCOUNTER — Other Ambulatory Visit: Payer: Self-pay | Admitting: Student

## 2020-08-25 DIAGNOSIS — Z794 Long term (current) use of insulin: Secondary | ICD-10-CM | POA: Diagnosis not present

## 2020-08-25 DIAGNOSIS — E669 Obesity, unspecified: Secondary | ICD-10-CM | POA: Diagnosis not present

## 2020-08-25 DIAGNOSIS — R197 Diarrhea, unspecified: Secondary | ICD-10-CM | POA: Diagnosis not present

## 2020-08-25 DIAGNOSIS — E039 Hypothyroidism, unspecified: Secondary | ICD-10-CM | POA: Diagnosis not present

## 2020-08-25 DIAGNOSIS — E1165 Type 2 diabetes mellitus with hyperglycemia: Secondary | ICD-10-CM | POA: Diagnosis not present

## 2020-09-01 DIAGNOSIS — E039 Hypothyroidism, unspecified: Secondary | ICD-10-CM | POA: Diagnosis not present

## 2020-09-01 DIAGNOSIS — E113291 Type 2 diabetes mellitus with mild nonproliferative diabetic retinopathy without macular edema, right eye: Secondary | ICD-10-CM | POA: Diagnosis not present

## 2020-09-01 DIAGNOSIS — I5022 Chronic systolic (congestive) heart failure: Secondary | ICD-10-CM | POA: Diagnosis not present

## 2020-09-01 DIAGNOSIS — E7800 Pure hypercholesterolemia, unspecified: Secondary | ICD-10-CM | POA: Diagnosis not present

## 2020-09-01 DIAGNOSIS — Z950 Presence of cardiac pacemaker: Secondary | ICD-10-CM | POA: Diagnosis not present

## 2020-09-01 DIAGNOSIS — Z79899 Other long term (current) drug therapy: Secondary | ICD-10-CM | POA: Diagnosis not present

## 2020-09-01 DIAGNOSIS — I459 Conduction disorder, unspecified: Secondary | ICD-10-CM | POA: Diagnosis not present

## 2020-09-01 DIAGNOSIS — I10 Essential (primary) hypertension: Secondary | ICD-10-CM | POA: Diagnosis not present

## 2020-09-01 DIAGNOSIS — E1121 Type 2 diabetes mellitus with diabetic nephropathy: Secondary | ICD-10-CM | POA: Diagnosis not present

## 2020-09-02 ENCOUNTER — Other Ambulatory Visit: Payer: Self-pay

## 2020-09-02 DIAGNOSIS — I5022 Chronic systolic (congestive) heart failure: Secondary | ICD-10-CM

## 2020-09-02 MED ORDER — ENTRESTO 49-51 MG PO TABS: 1.0000 | ORAL_TABLET | Freq: Two times a day (BID) | ORAL | 8 refills | Status: DC

## 2020-09-02 NOTE — Telephone Encounter (Signed)
Pt's medication was sent to pt's pharmacy as requested. Confirmation received.  °

## 2020-11-02 DIAGNOSIS — I10 Essential (primary) hypertension: Secondary | ICD-10-CM | POA: Diagnosis not present

## 2020-11-24 DIAGNOSIS — E039 Hypothyroidism, unspecified: Secondary | ICD-10-CM | POA: Diagnosis not present

## 2020-11-24 DIAGNOSIS — E1165 Type 2 diabetes mellitus with hyperglycemia: Secondary | ICD-10-CM | POA: Diagnosis not present

## 2020-11-24 DIAGNOSIS — E669 Obesity, unspecified: Secondary | ICD-10-CM | POA: Diagnosis not present

## 2020-11-24 DIAGNOSIS — Z794 Long term (current) use of insulin: Secondary | ICD-10-CM | POA: Diagnosis not present

## 2020-12-16 ENCOUNTER — Ambulatory Visit
Admission: RE | Admit: 2020-12-16 | Discharge: 2020-12-16 | Disposition: A | Discharge status: Home / Self Care | Payer: Medicare PPO | Source: Ambulatory Visit | Attending: Physician Assistant | Admitting: Physician Assistant

## 2020-12-16 ENCOUNTER — Other Ambulatory Visit: Payer: Self-pay | Admitting: Physician Assistant

## 2020-12-16 DIAGNOSIS — R61 Generalized hyperhidrosis: Secondary | ICD-10-CM

## 2020-12-16 DIAGNOSIS — R051 Acute cough: Secondary | ICD-10-CM | POA: Diagnosis not present

## 2020-12-16 DIAGNOSIS — R0981 Nasal congestion: Secondary | ICD-10-CM

## 2020-12-16 DIAGNOSIS — R059 Cough, unspecified: Secondary | ICD-10-CM | POA: Diagnosis not present

## 2021-01-26 DIAGNOSIS — E1165 Type 2 diabetes mellitus with hyperglycemia: Secondary | ICD-10-CM | POA: Diagnosis not present

## 2021-01-26 DIAGNOSIS — Z794 Long term (current) use of insulin: Secondary | ICD-10-CM | POA: Diagnosis not present

## 2021-01-26 DIAGNOSIS — E039 Hypothyroidism, unspecified: Secondary | ICD-10-CM | POA: Diagnosis not present

## 2021-01-26 DIAGNOSIS — E669 Obesity, unspecified: Secondary | ICD-10-CM | POA: Diagnosis not present

## 2021-02-01 ENCOUNTER — Telehealth: Payer: Self-pay | Admitting: *Deleted

## 2021-02-01 DIAGNOSIS — R0981 Nasal congestion: Secondary | ICD-10-CM | POA: Diagnosis not present

## 2021-02-01 DIAGNOSIS — R051 Acute cough: Secondary | ICD-10-CM | POA: Diagnosis not present

## 2021-02-08 ENCOUNTER — Other Ambulatory Visit: Payer: Self-pay

## 2021-02-08 ENCOUNTER — Ambulatory Visit: Payer: Medicare PPO | Admitting: Podiatry

## 2021-02-08 ENCOUNTER — Ambulatory Visit (INDEPENDENT_AMBULATORY_CARE_PROVIDER_SITE_OTHER): Payer: Medicare PPO

## 2021-02-08 ENCOUNTER — Encounter: Payer: Self-pay | Admitting: Podiatry

## 2021-02-08 VITALS — BP 133/81 | HR 71 | Temp 98.1°F

## 2021-02-08 DIAGNOSIS — E08621 Diabetes mellitus due to underlying condition with foot ulcer: Secondary | ICD-10-CM

## 2021-02-08 DIAGNOSIS — H6983 Other specified disorders of Eustachian tube, bilateral: Secondary | ICD-10-CM | POA: Diagnosis not present

## 2021-02-08 DIAGNOSIS — E1142 Type 2 diabetes mellitus with diabetic polyneuropathy: Secondary | ICD-10-CM

## 2021-02-08 DIAGNOSIS — L84 Corns and callosities: Secondary | ICD-10-CM | POA: Diagnosis not present

## 2021-02-08 DIAGNOSIS — Z23 Encounter for immunization: Secondary | ICD-10-CM | POA: Diagnosis not present

## 2021-02-08 DIAGNOSIS — M2042 Other hammer toe(s) (acquired), left foot: Secondary | ICD-10-CM | POA: Diagnosis not present

## 2021-02-08 DIAGNOSIS — L97521 Non-pressure chronic ulcer of other part of left foot limited to breakdown of skin: Secondary | ICD-10-CM | POA: Diagnosis not present

## 2021-02-08 NOTE — Progress Notes (Signed)
  Subjective:  Patient ID: Tony [name], male    DOB: [birth year],   MRN: [id number]  Chief Complaint  Patient presents with   Diabetic Ulcer    Patient reports a ulcer has formed after having a fall and injuring the toe. Patient has used triple antibiotic ointment on the area and covered with a band aid.     70 y.o. male presents for diabetic foot care . Relates he had a wound on his left second toe a year ago that healed but is tender now. States he is diabetic and last A1c was  8.2 and is working with his PCP to get it down.   Denies any other pedal complaints. Denies n/v/f/c.   PCP Lajean Manes MD. Last seen 02/08/21.  Past Medical History:  Diagnosis Date   Cancer ([place])    thyroid   Complete heart block ([place])    a. s/p MDT dual chamber PPM followed by Dr [name]    Complication of anesthesia    1985 after Thyriodectomy hard time waking up   Diabetes mellitus    GERD (gastroesophageal reflux disease)    Hypertension    Hypothyroidism    Had two surgeries for Cancer    Objective:  Physical Exam: Vascular: DP/PT pulses 2/4 bilateral. CFT <3 seconds. Normal hair growth on digits. No edema.  Skin. No lacerations or abrasions bilateral feet. Hyperkeratotic tissue over left second PIPJ.  Musculoskeletal: MMT 5/5 bilateral lower extremities in DF, PF, Inversion and Eversion. Deceased ROM in DF of ankle joint. Tender over left second digit.  Neurological: Sensation intact to light touch. Protective sensation diminished.   Assessment:   1. Pre-ulcerative calluses   2. Hammertoe of left foot   3. Type 2 diabetes mellitus with peripheral neuropathy (HCC)      Plan:  Patient was evaluated and treated and all questions answered. X-rays reviewed and discussed with patient. No acute fractures or dislocations noted. No osseous erosions. 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  -Stressed the importance of good glycemic control and the detriment of not  controlling glucose levels in relation to the foot. -Discussed supportive shoes at all times and checking feet regularly.  -Hyperkeratotic tissue trimmed without incident.  -Answered all patient questions -Patient to return  one year for diabetic foot exam.  -Patient advised to call the office if any problems or questions arise in the meantime.   Lorenda Peck, DPM

## 2021-02-21 DIAGNOSIS — E119 Type 2 diabetes mellitus without complications: Secondary | ICD-10-CM | POA: Diagnosis not present

## 2021-02-21 DIAGNOSIS — E039 Hypothyroidism, unspecified: Secondary | ICD-10-CM | POA: Diagnosis not present

## 2021-02-21 DIAGNOSIS — E669 Obesity, unspecified: Secondary | ICD-10-CM | POA: Diagnosis not present

## 2021-02-21 DIAGNOSIS — Z794 Long term (current) use of insulin: Secondary | ICD-10-CM | POA: Diagnosis not present

## 2021-02-21 NOTE — Telephone Encounter (Signed)
error 

## 2021-03-15 DIAGNOSIS — E782 Mixed hyperlipidemia: Secondary | ICD-10-CM | POA: Diagnosis not present

## 2021-03-15 DIAGNOSIS — I10 Essential (primary) hypertension: Secondary | ICD-10-CM | POA: Diagnosis not present

## 2021-03-15 DIAGNOSIS — Z79899 Other long term (current) drug therapy: Secondary | ICD-10-CM | POA: Diagnosis not present

## 2021-03-15 DIAGNOSIS — E1121 Type 2 diabetes mellitus with diabetic nephropathy: Secondary | ICD-10-CM | POA: Diagnosis not present

## 2021-03-15 DIAGNOSIS — F331 Major depressive disorder, recurrent, moderate: Secondary | ICD-10-CM | POA: Diagnosis not present

## 2021-03-15 DIAGNOSIS — E7800 Pure hypercholesterolemia, unspecified: Secondary | ICD-10-CM | POA: Diagnosis not present

## 2021-03-15 DIAGNOSIS — K219 Gastro-esophageal reflux disease without esophagitis: Secondary | ICD-10-CM | POA: Diagnosis not present

## 2021-03-15 DIAGNOSIS — Z1389 Encounter for screening for other disorder: Secondary | ICD-10-CM | POA: Diagnosis not present

## 2021-03-15 DIAGNOSIS — Z0000 Encounter for general adult medical examination without abnormal findings: Secondary | ICD-10-CM | POA: Diagnosis not present

## 2021-03-15 DIAGNOSIS — E039 Hypothyroidism, unspecified: Secondary | ICD-10-CM | POA: Diagnosis not present

## 2021-03-15 DIAGNOSIS — I5022 Chronic systolic (congestive) heart failure: Secondary | ICD-10-CM | POA: Diagnosis not present

## 2021-03-15 DIAGNOSIS — G4733 Obstructive sleep apnea (adult) (pediatric): Secondary | ICD-10-CM | POA: Diagnosis not present

## 2021-03-21 DIAGNOSIS — I10 Essential (primary) hypertension: Secondary | ICD-10-CM | POA: Diagnosis not present

## 2021-03-21 DIAGNOSIS — R399 Unspecified symptoms and signs involving the genitourinary system: Secondary | ICD-10-CM | POA: Diagnosis not present

## 2021-03-21 DIAGNOSIS — E039 Hypothyroidism, unspecified: Secondary | ICD-10-CM | POA: Diagnosis not present

## 2021-04-06 DIAGNOSIS — R2689 Other abnormalities of gait and mobility: Secondary | ICD-10-CM | POA: Diagnosis not present

## 2021-04-06 DIAGNOSIS — R350 Frequency of micturition: Secondary | ICD-10-CM | POA: Diagnosis not present

## 2021-04-12 NOTE — Progress Notes (Signed)
Cardiology Clinic Note   Patient Name: Tony [name] Date of Encounter: 04/13/2021  Primary Care Provider:  Lajean Manes, MD Primary Cardiologist:  [name], MD  Patient Profile    Tony [name] 70 year old male presents to the clinic today for follow-up evaluation of his aortic stenosis  Past Medical History    Past Medical History:  Diagnosis Date   Cancer [facility])    thyroid   Complete heart block ([place])    a. s/p MDT dual chamber PPM followed by Dr [name]    Complication of anesthesia    1985 after Thyriodectomy hard time waking up   Diabetes mellitus    GERD (gastroesophageal reflux disease)    Hypertension    Hypothyroidism    Had two surgeries for Cancer   Past Surgical History:  Procedure Laterality Date   LUMBAR LAMINECTOMY/DECOMPRESSION MICRODISCECTOMY  07/19/2011   Procedure: LUMBAR LAMINECTOMY/DECOMPRESSION MICRODISCECTOMY 3 LEVELS;  Surgeon: Melina Schools, MD;  Location: [place];  Service: Orthopedics;  Laterality: Left;  Lumbar three-Lumbar five LEFT DECOMPRESSION AND FORAMINOTOMY Lumbar three-four LEFT DISCECTOMY   PERMANENT PACEMAKER INSERTION N/A 08/11/2014   MDT Adapta L implanted by Dr [name] for CHB   Thyroidectomy x2      Allergies  Allergies  Allergen Reactions   Corticosteroids    Codeine Other (See Comments)   Crestor [Rosuvastatin Calcium] Other (See Comments)    Leg pain   Metformin Hcl Other (See Comments)   Simvastatin Other (See Comments)    Leg pain    History of Present Illness    Tony [name] has a PMH of complete heart block status post PPM 11/04/2019, aortic stenosis, essential hypertension, chronic systolic CHF, OSA on CPAP, type 2 diabetes, hypothyroidism, and dyspnea on exertion.  Echocardiogram 04/14/2020 EF 40-45%, G1 DD, trivial aortic valve regurgitation with moderate aortic valve stenosis and a aortic valve mean gradient measuring 31.6 mmHg.  Aortic dilation was also noted with a ascending aorta measuring 40  mm.  He was seen and evaluated by Dr. [name] 05/02/2020.  During that time he continued to do fairly well.  He was noted to have shortness of breath with moderate activity.  He denied palpitations and chest pain.  He denied lower extremity edema, dizziness, presyncope, and syncope.  His PPM was functioning normally and no changes were made.  He is device dependent.  Upgrade to CRT-P was discussed.  However, medical therapy was recommended.  He reported compliance with his CPAP.  He presents the clinic today to discuss possible TAVR.  He states he has just finished his second course of antibiotics for UTI.  He first contracted UTI about 6 weeks ago.  During that time he reports that he felt that his balance was unstable.  On his recent move a he reports there was no growth.  We reviewed his previous echocardiogram.  He reports that he has been walking and doing resistance type leg exercises every other day.  He has stopped going to the gym because of COVID-19.  He reports that he eats a well-balanced diet and enjoys salads.  His weight has remained stable.  He feels that since being treated for his UTI his breathing and stability have returned to their baseline.  We discussed TAVR procedure.  At this time I will repeat his echocardiogram, have him increase his physical activity as tolerated, and plan follow-up for 3 months.  Today he denies chest pain, shortness of breath, lower extremity edema, fatigue, palpitations, melena, hematuria, hemoptysis,  diaphoresis, weakness, presyncope, syncope, orthopnea, and PND.   Home Medications    Prior to Admission medications   Medication Sig Start Date End Date Taking? Authorizing Provider  acetaminophen (TYLENOL) 500 MG tablet Take 500 mg by mouth every 6 (six) hours as needed (pain).     [provider]  aspirin EC 81 MG tablet Take 81 mg by mouth daily.    [provider]  atorvastatin (LIPITOR) 10 MG tablet Take 10 mg by mouth daily.     [provider]  carvedilol (COREG) 6.25 MG tablet TAKE 1 TABLET(6.25 MG) BY MOUTH TWICE DAILY 07/08/20   Shirley Friar, PA-C  Chlorpheniramine-DM (CORICIDIN HBP COUGH/COLD PO) Take by mouth.    [provider]  cyclobenzaprine (FLEXERIL) 10 MG tablet Take 10 mg by mouth 3 (three) times daily as needed. For muscle pain.    [provider]  gabapentin (NEURONTIN) 100 MG capsule Take 2 capsules (200 mg total) by mouth at bedtime. Patient taking differently: Take 100 mg by mouth at bedtime. 02/24/19   Bo Merino, MD  glipiZIDE (GLUCOTROL) 10 MG tablet Take 10 mg by mouth daily.  07/04/18   [provider]  ibuprofen (ADVIL,MOTRIN) 800 MG tablet Take 800 mg by mouth every 8 (eight) hours as needed. For pain.    [provider]  INVOKAMET XR [id number] MG TB24 Take 2 tablets by mouth daily.  07/24/18   [provider]  levothyroxine (SYNTHROID, LEVOTHROID) 175 MCG tablet Take 175 mcg by mouth daily before breakfast.     [provider]  Multiple Vitamins-Minerals (SENIOR MULTIVITAMIN PLUS PO) Take by mouth daily.    [provider]  mupirocin ointment (BACTROBAN) 2 % Apply 1 application topically as needed.  08/10/14   [provider]  omeprazole (PRILOSEC) 20 MG capsule Take 20 mg by mouth daily as needed. For acid reflux    [provider]  potassium chloride 20 MEQ TBCR Take 20 mEq by mouth daily. 09/18/19   Shirley Friar, PA-C  sacubitril-valsartan (ENTRESTO) 49-51 MG Take 1 tablet by mouth 2 (two) times daily. 09/02/20   Allred, Jeneen Rinks, MD  sertraline (ZOLOFT) 100 MG tablet Take 200 mg by mouth daily.     [provider]  Tamsulosin HCl (FLOMAX) 0.4 MG CAPS Take 1 capsule (0.4 mg total) by mouth every morning. 07/21/11   Carron Curie, PA-C  TRULICITY 3 SW/1.0XN SOPN once a week. 09/24/19   [provider]    Family History    Family History  Problem Relation Age of Onset    Lung cancer Mother    Heart disease Father    Heart attack Father    Healthy Sister    Healthy Daughter    Healthy Son    Anesthesia problems Neg Hx    Hypotension Neg Hx    Pseudochol deficiency Neg Hx    He indicated that his mother is deceased. He indicated that his father is deceased. He indicated that his sister is alive. He indicated that his brother is deceased. He indicated that his maternal grandmother is deceased. He indicated that his maternal grandfather is deceased. He indicated that his paternal grandmother is deceased. He indicated that his paternal grandfather is deceased. He indicated that his daughter is alive. He indicated that his son is alive. He indicated that the status of his neg hx is unknown.  Social History    Social History   Socioeconomic History   Marital status: Married  Spouse name: jody   Number of children: 2   Years of education: college   Highest education level: Not on file  Occupational History   Occupation: retired  Tobacco Use   Smoking status: Never   Smokeless tobacco: Never  Vaping Use   Vaping Use: Never used  Substance and Sexual Activity   Alcohol use: No    Alcohol/week: 0.0 standard drinks   Drug use: No   Sexual activity: Not on file  Other Topics Concern   Not on file  Social History Narrative   Not on file   Social Determinants of Health   Financial Resource Strain: Not on file  Food Insecurity: Not on file  Transportation Needs: Not on file  Physical Activity: Not on file  Stress: Not on file  Social Connections: Not on file  Intimate Partner Violence: Not on file     Review of Systems    General:  No chills, fever, night sweats or weight changes.  Cardiovascular:  No chest pain, dyspnea on exertion, edema, orthopnea, palpitations, paroxysmal nocturnal dyspnea. Dermatological: No rash, lesions/masses Respiratory: No cough, dyspnea Urologic: No hematuria, dysuria Abdominal:   No nausea, vomiting,  diarrhea, bright red blood per rectum, melena, or hematemesis Neurologic:  No visual changes, wkns, changes in mental status. All other systems reviewed and are otherwise negative except as noted above.  Physical Exam    VS:  BP 124/78 (BP Location: Left Arm) Comment: Just had coffee before visit   Pulse 62    Ht 6' (1.829 m)    Wt 238 lb 4.8 oz (108.1 kg)    SpO2 97%    BMI 32.32 kg/m  , BMI Body mass index is 32.32 kg/m. GEN: Well nourished, well developed, in no acute distress. HEENT: normal. Neck: Supple, no JVD, carotid bruits, or masses. Cardiac: RRR, 2/6 systolic murmur heard along right sternal border, rubs, or gallops. No clubbing, cyanosis, edema.  Radials/DP/PT 2+ and equal bilaterally.  Respiratory:  Respirations regular and unlabored, clear to auscultation bilaterally. GI: Soft, nontender, nondistended, BS + x 4. MS: no deformity or atrophy. Skin: warm and dry, no rash. Neuro:  Strength and sensation are intact. Psych: Normal affect.  Accessory Clinical Findings    Recent Labs: No results found for requested labs within last 8760 hours.   Recent Lipid Panel No results found for: CHOL, TRIG, HDL, CHOLHDL, VLDL, LDLCALC, LDLDIRECT  ECG personally reviewed by me today-a sensed V paced 62 bpm- No acute changes  Echocardiogram 04/14/2020  IMPRESSIONS     1. Left ventricular ejection fraction, by estimation, is 40 to 45%. The  left ventricle has mildly decreased function. The left ventricle  demonstrates regional wall motion abnormalities (see scoring  diagram/findings for description). There is severe  concentric left ventricular hypertrophy. Left ventricular diastolic  parameters are consistent with Grade I diastolic dysfunction (impaired  relaxation).   2. Right ventricular systolic function is normal. The right ventricular  size is mildly enlarged.   3. Left atrial size was moderately dilated.   4. The mitral valve is grossly normal. Mild mitral valve  regurgitation.   5. The aortic valve is tricuspid. Aortic valve regurgitation is trivial.  Moderate aortic valve stenosis. Aortic valve mean gradient measures 31.6  mmHg. Aortic valve Vmax measures 3.47 m/s.   6. Aortic dilatation noted. There is mild dilatation of the ascending  aorta, measuring 40 mm.   Comparison(s): A prior study was performed on 09/22/19. No significant  change from prior study.  Prior images reviewed side by side.  Assessment & Plan   1.  Aortic stenosis-notes stable dyspnea with exertion.  Has been fairly sedentary.  He reports that he has been doing some leg resistance exercises every other day.  With COVID he has stopped going to the gym.  Echocardiogram 04/14/2020 showed an LVEF of 40-45%, G1 DD, mild mitral valve regurgitation, and moderate aortic valve stenosis.  Details above. Repeat echocardiogram Heart healthy low-sodium diet-salty 6 given Increase physical activity as tolerated Based on echocardiogram may need referral for TAVR evaluation.  Essential hypertension-BP today 124/78.  Well-controlled at home. Continue carvedilol, Entresto Heart healthy low-sodium diet-salty 6 given Increase physical activity as tolerated  Hyperlipidemia-LDL 78 on 03/15/21 Continue aspirin, atorvastatin Heart healthy low-sodium high-fiber diet Increase physical activity as tolerated  OSA-reports compliance with CPAP.  Waking up well rested. Continue CPAP use  Complete heart block-status post PPM.  Follows with Dr. [name].  Device interrogated 06/10/2020.  Showed normal device function, battery status and lead stable.  Histograms appropriate. Follows with Dr. [name]  Type 2 diabetes-glucose 243 on 12/17/2019. Heart healthy low-sodium carb modified diet Increase physical activity as tolerated Follows with PCP  Disposition: Follow-up with [name] or me in 3 months.  Jossie Ng. Cecely Rengel NP-C    04/13/2021, 11:10 AM [place]  [address] Office [phone] Fax [phone]  Notice: This dictation was prepared with Dragon dictation along with smaller phrase technology. Any transcriptional errors that result from this process are unintentional and may not be corrected upon review.  I spent 14 minutes examining this patient, reviewing medications, and using patient centered shared decision making involving her cardiac care.  Prior to her visit I spent greater than 20 minutes reviewing her past medical history,  medications, and prior cardiac tests.

## 2021-04-13 ENCOUNTER — Encounter: Payer: Self-pay | Admitting: General Practice

## 2021-04-13 ENCOUNTER — Other Ambulatory Visit: Payer: Self-pay

## 2021-04-13 ENCOUNTER — Ambulatory Visit: Payer: Medicare PPO | Admitting: General Practice

## 2021-04-13 VITALS — BP 124/78 | HR 62 | Ht 72.0 in | Wt 238.3 lb

## 2021-04-13 DIAGNOSIS — Z950 Presence of cardiac pacemaker: Secondary | ICD-10-CM

## 2021-04-13 DIAGNOSIS — I442 Atrioventricular block, complete: Secondary | ICD-10-CM | POA: Diagnosis not present

## 2021-04-13 DIAGNOSIS — G4733 Obstructive sleep apnea (adult) (pediatric): Secondary | ICD-10-CM | POA: Diagnosis not present

## 2021-04-13 DIAGNOSIS — E782 Mixed hyperlipidemia: Secondary | ICD-10-CM | POA: Diagnosis not present

## 2021-04-13 DIAGNOSIS — E1142 Type 2 diabetes mellitus with diabetic polyneuropathy: Secondary | ICD-10-CM

## 2021-04-13 DIAGNOSIS — I350 Nonrheumatic aortic (valve) stenosis: Secondary | ICD-10-CM | POA: Diagnosis not present

## 2021-04-13 DIAGNOSIS — I10 Essential (primary) hypertension: Secondary | ICD-10-CM

## 2021-04-13 DIAGNOSIS — R0609 Other forms of dyspnea: Secondary | ICD-10-CM

## 2021-04-13 NOTE — Patient Instructions (Signed)
Medication Instructions:  The current medical regimen is effective;  continue present plan and medications as directed. Please refer to the Current Medication list given to you today.  *If you need a refill on your cardiac medications before your next appointment, please call your pharmacy*  Lab Work: NONE  Testing/Procedures: Echocardiogram - Your physician has requested that you have an echocardiogram. Echocardiography is a painless test that uses sound waves to create images of your heart. It provides your doctor with information about the size and shape of your heart and how well your hearts chambers and valves are working. This procedure takes approximately one hour. There are no restrictions for this procedure. This will be performed at either our [facility] location - [address] [place] location [facility] 2nd floor.  Special Instructions PLEASE READ AND FOLLOW SALTY 6-ATTACHED-1,800 mg daily  PLEASE INCREASE PHYSICAL ACTIVITY AS TOLERATED-WALK AT LEAST 10 MINUTES TWICE A WEEK  Follow-Up: Your next appointment:  3 month(s) In Person with Tony Majestic, MD   Please call our office 2 months in advance to schedule this appointment  At [facility], you and your health needs are our priority.  As part of our continuing mission to provide you with exceptional heart care, we have created designated Provider Care Teams.  These Care Teams include your primary Cardiologist (physician) and Advanced Practice Providers (APPs -  Physician Assistants and Nurse Practitioners) who all work together to provide you with the care you need, when you need it.

## 2021-05-04 ENCOUNTER — Ambulatory Visit (HOSPITAL_COMMUNITY): Payer: Medicare PPO | Attending: Internal Medicine

## 2021-05-04 ENCOUNTER — Other Ambulatory Visit: Payer: Self-pay

## 2021-05-04 DIAGNOSIS — I350 Nonrheumatic aortic (valve) stenosis: Secondary | ICD-10-CM

## 2021-05-04 LAB — ECHOCARDIOGRAM COMPLETE
AR max vel: 1.06 cm2
AV Area VTI: 1.02 cm2
AV Area mean vel: 1.13 cm2
AV Mean grad: 25 mmHg
AV Peak grad: 45.7 mmHg
Ao pk vel: 3.38 m/s
Area-P 1/2: 2.54 cm2
P 1/2 time: 621 msec
S' Lateral: 2.6 cm

## 2021-05-05 ENCOUNTER — Other Ambulatory Visit: Payer: Self-pay

## 2021-05-05 DIAGNOSIS — I350 Nonrheumatic aortic (valve) stenosis: Secondary | ICD-10-CM

## 2021-05-18 DIAGNOSIS — W01190A Fall on same level from slipping, tripping and stumbling with subsequent striking against furniture, initial encounter: Secondary | ICD-10-CM | POA: Diagnosis not present

## 2021-05-18 DIAGNOSIS — S0121XA Laceration without foreign body of nose, initial encounter: Secondary | ICD-10-CM | POA: Diagnosis not present

## 2021-05-18 DIAGNOSIS — S01111A Laceration without foreign body of right eyelid and periocular area, initial encounter: Secondary | ICD-10-CM | POA: Diagnosis not present

## 2021-05-19 DIAGNOSIS — H26491 Other secondary cataract, right eye: Secondary | ICD-10-CM | POA: Diagnosis not present

## 2021-05-19 DIAGNOSIS — E113293 Type 2 diabetes mellitus with mild nonproliferative diabetic retinopathy without macular edema, bilateral: Secondary | ICD-10-CM | POA: Diagnosis not present

## 2021-05-19 DIAGNOSIS — H43813 Vitreous degeneration, bilateral: Secondary | ICD-10-CM | POA: Diagnosis not present

## 2021-05-19 DIAGNOSIS — H35033 Hypertensive retinopathy, bilateral: Secondary | ICD-10-CM | POA: Diagnosis not present

## 2021-05-25 DIAGNOSIS — E039 Hypothyroidism, unspecified: Secondary | ICD-10-CM | POA: Diagnosis not present

## 2021-05-25 DIAGNOSIS — E1165 Type 2 diabetes mellitus with hyperglycemia: Secondary | ICD-10-CM | POA: Diagnosis not present

## 2021-05-25 DIAGNOSIS — Z9181 History of falling: Secondary | ICD-10-CM | POA: Diagnosis not present

## 2021-05-25 DIAGNOSIS — E669 Obesity, unspecified: Secondary | ICD-10-CM | POA: Diagnosis not present

## 2021-05-25 DIAGNOSIS — Z794 Long term (current) use of insulin: Secondary | ICD-10-CM | POA: Diagnosis not present

## 2021-05-31 ENCOUNTER — Telehealth: Payer: Self-pay | Admitting: General Practice

## 2021-05-31 ENCOUNTER — Other Ambulatory Visit: Payer: Self-pay

## 2021-05-31 DIAGNOSIS — I350 Nonrheumatic aortic (valve) stenosis: Secondary | ICD-10-CM

## 2021-05-31 NOTE — Telephone Encounter (Signed)
Patient's daughter is calling requesting a referral be made for a TAVR Consult with Dr. Burt Knack. She reports that this is something the patient discussed with Denyse Amass at his last appointment. Daughter states she will be sending the office a message in regards to this as well. She would like to be the one called back to schedule so she is able to attend the appt as well.

## 2021-05-31 NOTE — Telephone Encounter (Signed)
Spoke with daughter who is requesting a TAVR consult with Dr. Burt Knack. Informed daughter that I will put in the request with our Structural Heart department.

## 2021-06-01 NOTE — Telephone Encounter (Signed)
Appt scheduled for 06/2021

## 2021-06-07 ENCOUNTER — Ambulatory Visit: Payer: Medicare PPO | Attending: Internal Medicine

## 2021-06-07 ENCOUNTER — Other Ambulatory Visit: Payer: Self-pay

## 2021-06-07 DIAGNOSIS — R2689 Other abnormalities of gait and mobility: Secondary | ICD-10-CM | POA: Diagnosis not present

## 2021-06-07 NOTE — Therapy (Signed)
[facility] [address] [place], [place], [address] Phone: [phone]   Fax:  [phone]  Physical Therapy Evaluation  Patient Details  Name: Tony [name] MRN: [id number] Date of Birth: 27-Jul-1950 Referring Provider (PT): Dr. Buddy Duty   Encounter Date: 06/07/2021   PT End of Session - 06/07/21 1454     Visit Number 1    Number of Visits 9    Date for PT Re-Evaluation 07/05/21    PT Start Time 1450    PT Stop Time 1530    PT Time Calculation (min) 40 min    Equipment Utilized During Treatment Gait belt    Activity Tolerance Patient tolerated treatment well    Behavior During Therapy [facility] for tasks assessed/performed             Past Medical History:  Diagnosis Date   Cancer ([place])    thyroid   Complete heart block ([place])    a. s/p MDT dual chamber PPM followed by Dr [name]    Complication of anesthesia    1985 after Thyriodectomy hard time waking up   Diabetes mellitus    GERD (gastroesophageal reflux disease)    Hypertension    Hypothyroidism    Had two surgeries for Cancer    Past Surgical History:  Procedure Laterality Date   LUMBAR LAMINECTOMY/DECOMPRESSION MICRODISCECTOMY  07/19/2011   Procedure: LUMBAR LAMINECTOMY/DECOMPRESSION MICRODISCECTOMY 3 LEVELS;  Surgeon: Melina Schools, MD;  Location: [facility];  Service: Orthopedics;  Laterality: Left;  Lumbar three-Lumbar five LEFT DECOMPRESSION AND FORAMINOTOMY Lumbar three-four LEFT DISCECTOMY   PERMANENT PACEMAKER INSERTION N/A 08/11/2014   MDT Adapta L implanted by Dr [name] for CHB   Thyroidectomy x2      There were no vitals filed for this visit.    Subjective Assessment - 06/07/21 1455     Subjective Pt reports he feels he is feeling more unstable for last 6 months. He has had couple of falls in last 6 months. He reports one time was he was carrying tools in both hands and down the steps and lost balance and fell. Second fall, he tripped on the rug  going into the bathroom.    Patient Stated Goals improve balance    Currently in Pain? No/denies                [facility] PT Assessment - 06/07/21 1503       Assessment   Medical Diagnosis Gaitfalls    Referring Provider (PT) Dr. Buddy Duty    Onset Date/Surgical Date 05/26/21      Precautions   Precautions Fall      Balance Screen   Has the patient fallen in the past 6 months Yes    How many times? 2    Has the patient had a decrease in activity level because of a fear of falling?  No      Home Environment   Living Environment Private residence    Available Help at Discharge Family    Type of [place] to enter    Entrance Stairs-Number of Steps 15    Entrance Stairs-Rails Right      Prior Function   Level of [place] Retired      Associate Professor   Overall Cognitive Status Within Functional Limits for tasks assessed      Standardized Balance Assessment   Standardized Balance Assessment Five Times Sit to Stand;10 meter walk test  Five times sit to stand comments  20      High Level Balance   High Level Balance Comments Modified CTSIB: 85/120 (test 3 20 sec, test 4 5 sec)      Functional Gait  Assessment   Gait assessed  Yes    Gait Level Surface Walks 20 ft in less than 7 sec but greater than 5.5 sec, uses assistive device, slower speed, mild gait deviations, or deviates 6-10 in outside of the 12 in walkway width.    Change in Gait Speed Able to smoothly change walking speed without loss of balance or gait deviation. Deviate no more than 6 in outside of the 12 in walkway width.    Gait with Horizontal Head Turns Performs head turns smoothly with slight change in gait velocity (eg, minor disruption to smooth gait path), deviates 6-10 in outside 12 in walkway width, or uses an assistive device.    Gait with Vertical Head Turns Performs task with slight change in gait velocity (eg, minor disruption to smooth gait path),  deviates 6 - 10 in outside 12 in walkway width or uses assistive device    Gait and Pivot Turn Pivot turns safely within 3 sec and stops quickly with no loss of balance.    Step Over Obstacle Is able to step over 2 stacked shoe boxes taped together (9 in total height) without changing gait speed. No evidence of imbalance.    Gait with Narrow Base of Support Is able to ambulate for 10 steps heel to toe with no staggering.    Gait with Eyes Closed Cannot walk 20 ft without assistance, severe gait deviations or imbalance, deviates greater than 15 in outside 12 in walkway width or will not attempt task.    Ambulating Backwards Walks 20 ft, no assistive devices, good speed, no evidence for imbalance, normal gait    Steps Alternating feet, must use rail.    Total Score 23               Pt educated on session findings, goals, HEP Sit to stand: 10x no HHA         Objective measurements completed on examination: See above findings.                  PT Short Term Goals - 06/07/21 1621       PT SHORT TERM GOAL #1   Title STG=LTG               PT Long Term Goals - 06/07/21 1621       PT LONG TERM GOAL #1   Title Pt will demo >26/30 on FGA to improve functional gait and balance    Baseline 23/30 (06/07/21)    Time 4    Period Weeks    Status New    Target Date 07/05/21      PT LONG TERM GOAL #2   Title Patient will demo >105/120 on Modified CTSIB to improve static balance and proprioception    Baseline 85/120 (06/07/21)    Time 4    Period Weeks    Status New    Target Date 07/05/21      PT LONG TERM GOAL #3   Title Pt will be I and compliant with HEP to self manage symptoms at home    Baseline TBD    Time 4    Period Weeks    Status New    Target Date 07/05/21  PT LONG TERM GOAL #4   Title Pt will dem <14 sec with 5x sit to stand to improve functional strength    Baseline 20 sec (06/07/21)    Time 4    Period Weeks    Status New    Target  Date 07/05/21                    Plan - 06/07/21 1622     Clinical Impression Statement Patient is a 71 y.o. male who was seen today for physical therapy evaluation and treatment for gait and mobility disorder. Patient demonstrates decreased functional balance with walking (FGA), decreased fucntional strength (5x sit to stand) and decreased static balance/proprioception (Modified CTSIB). These impairments are affecting patiengs gait and balance and has resulted in 2 falls within last 6 months. Patient will benefit froms skilled PT to address functional long term goals and improve function.    Personal Factors and Comorbidities Comorbidity 3+;Time since onset of injury/illness/exacerbation    Comorbidities complete heart block status post PPM 11/04/2019, aortic stenosis, essential hypertension, chronic systolic CHF, OSA on CPAP, type 2 diabetes, hypothyroidism, and dyspnea on exertion    Examination-Activity Limitations Carry;Stand;Squat    Examination-Participation Restrictions Cleaning;Community Activity;Laundry;Meal Prep;Yard Work    Stability/Clinical Decision Making Stable/Uncomplicated    Designer, jewellery Low    Rehab Potential Good    PT Frequency 2x / week    PT Duration 4 weeks    PT Treatment/Interventions ADLs/Self Care Home Management;Cryotherapy;Moist Heat;Balance training;Therapeutic exercise;Therapeutic activities;Functional mobility training;Stair training;Gait training;Neuromuscular re-education;Patient/family education;Orthotic Fit/Training;Manual techniques;Energy conservation;Joint Manipulations;Passive range of motion;Vestibular    PT Next Visit Plan Continue to work on balance with EC and on uneven surface (static and dynamic)    PT Home Exercise Plan Access Code [email or web address]    Consulted and Agree with Plan of Care Patient             Patient will benefit from skilled therapeutic intervention in order to improve the following deficits and impairments:   Abnormal gait, Decreased balance, Decreased safety awareness, Decreased endurance, Decreased strength, Difficulty walking, Improper body mechanics, Postural dysfunction  Visit Diagnosis: Other abnormalities of gait and mobility     Problem List Patient Active Problem List   Diagnosis Date Noted   Chronic systolic CHF (congestive heart failure) ([place]) 11/04/2019   Pacemaker 11/04/2019   Dyspnea on exertion 09/18/2019   Pain in left foot 04/09/2019   Toe infection 06/10/2017   Ganglion cyst 12/19/2015   Aortic stenosis 09/01/2014   Essential hypertension 09/01/2014   Type 2 diabetes mellitus with peripheral neuropathy ([place]) 09/01/2014   Hypothyroidism 09/01/2014   OSA on CPAP 09/01/2014   CHB (complete heart block) ([place]) 08/11/2014   Complete heart block ([place]) 08/05/2014   Lumbar stenosis 07/16/2011   HNP (herniated nucleus pulposus), lumbar 07/16/2011    Kerrie Pleasure, PT 06/07/2021, 4:26 PM  [facility] [address] [place], [place], [address] Phone: [phone]   Fax:  [phone]  Name: Tony [name] MRN: [id number] Date of Birth: [birth year]

## 2021-06-13 ENCOUNTER — Other Ambulatory Visit: Payer: Self-pay

## 2021-06-13 ENCOUNTER — Ambulatory Visit: Payer: Medicare PPO | Admitting: Physical Therapy

## 2021-06-13 ENCOUNTER — Encounter: Payer: Self-pay | Admitting: Physical Therapy

## 2021-06-13 DIAGNOSIS — R2689 Other abnormalities of gait and mobility: Secondary | ICD-10-CM | POA: Diagnosis not present

## 2021-06-13 NOTE — Patient Instructions (Addendum)
Access Code: [email or web address] URL: [email or web address] Date: 06/13/2021 Prepared by: Willow Ora  Exercises Sit to Stand with Arms Crossed - 2 x daily - 7 x weekly - 1 sets - 10 reps  Added to HEP this session:  Standing Near Stance in [place] with Eyes Closed - 1 x daily - 5 x weekly - 1 sets - 3 reps - 30 seconds hold Standing in corner with eyes closed - 1 x daily - 5 x weekly - 1 sets - 10 reps Tandem Walking with Counter Support - 1 x daily - 5 x weekly - 1 sets - 3 reps Toe Walking with Counter Support - 1 x daily - 5 x weekly - 1 sets - 3 reps Heel Walking with Counter Support - 1 x daily - 5 x weekly - 1 sets - 3 reps

## 2021-06-14 NOTE — Therapy (Signed)
[facility] [address] [place], [place], [address] Phone: [phone]   Fax:  [phone]  Physical Therapy Treatment  Patient Details  Name: Tony [name] MRN: [id number] Date of Birth: 12/29/1950 Referring Provider (PT): Dr. Buddy Duty   Encounter Date: 06/13/2021   PT End of Session - 06/13/21 1405     Visit Number 2    Number of Visits 9    Date for PT Re-Evaluation 07/05/21    PT Start Time 8850    PT Stop Time 1444    PT Time Calculation (min) 42 min    Equipment Utilized During Treatment Gait belt    Activity Tolerance Patient tolerated treatment well    Behavior During Therapy [facility] for tasks assessed/performed             Past Medical History:  Diagnosis Date   Cancer ([place])    thyroid   Complete heart block ([place])    a. s/p MDT dual chamber PPM followed by Dr [name]    Complication of anesthesia    1985 after Thyriodectomy hard time waking up   Diabetes mellitus    GERD (gastroesophageal reflux disease)    Hypertension    Hypothyroidism    Had two surgeries for Cancer    Past Surgical History:  Procedure Laterality Date   LUMBAR LAMINECTOMY/DECOMPRESSION MICRODISCECTOMY  07/19/2011   Procedure: LUMBAR LAMINECTOMY/DECOMPRESSION MICRODISCECTOMY 3 LEVELS;  Surgeon: Melina Schools, MD;  Location: [place];  Service: Orthopedics;  Laterality: Left;  Lumbar three-Lumbar five LEFT DECOMPRESSION AND FORAMINOTOMY Lumbar three-four LEFT DISCECTOMY   PERMANENT PACEMAKER INSERTION N/A 08/11/2014   MDT Adapta L implanted by Dr [name] for CHB   Thyroidectomy x2      There were no vitals filed for this visit.   Subjective Assessment - 06/13/21 1404     Subjective No new complaints. No falls or pain to report. HEP is going well, thighs still get sore from sit to stands.    Patient Stated Goals improve balance    Currently in Pain? No/denies                    [facility] Adult PT Treatment/Exercise -  06/13/21 1418       Transfers   Transfers Sit to Stand;Stand to Sit    Sit to Stand 6: Modified independent (Device/Increase time)    Stand to Sit 6: Modified independent (Device/Increase time)      Ambulation/Gait   Ambulation/Gait Yes    Ambulation/Gait Assistance 5: Supervision    Assistive device None    Gait Pattern Step-through pattern;Decreased stride length;Trunk flexed    Ambulation Surface Level;Indoor      Neuro Re-ed    Neuro Re-ed Details  for balance/muscle re-ed: isused HEP. Refer to May for full details. Min guard assist for safety with ex's performed; in corner on balance board in both anterior/posterior direction and lateral direction had pt perform the following: rocking the board with emphasis on tall posture and weight shifting with EO, progressing to EC. Then holding the board steady for EC 30 sec's x 3 reps. min guard to min assist with posterior bias for balance loss noted. use of walls/chair for balance assitance as needed.            Issued to HEP this session:  Access Code: [email or web address] URL: [email or web address] Date: 06/13/2021 Prepared by: Willow Ora  Exercises Sit to Stand with Arms Crossed - 2 x daily - 7 x weekly -  1 sets - 10 reps  Added to HEP this session:  Standing Near Stance in Corner with Eyes Closed - 1 x daily - 5 x weekly - 1 sets - 3 reps - 30 seconds hold Standing in corner with eyes closed - 1 x daily - 5 x weekly - 1 sets - 10 reps Tandem Walking with Counter Support - 1 x daily - 5 x weekly - 1 sets - 3 reps Toe Walking with Counter Support - 1 x daily - 5 x weekly - 1 sets - 3 reps Heel Walking with Counter Support - 1 x daily - 5 x weekly - 1 sets - 3 reps       PT Education - 06/13/21 1429     Education Details additions to HEP for balance    Person(s) Educated Patient    Methods Explanation;Demonstration;Verbal cues;Handout    Comprehension Verbalized understanding;Returned demonstration;Verbal cues  required;Need further instruction              PT Short Term Goals - 06/07/21 1621       PT SHORT TERM GOAL #1   Title STG=LTG               PT Long Term Goals - 06/07/21 1621       PT LONG TERM GOAL #1   Title Pt will demo >26/30 on FGA to improve functional gait and balance    Baseline 23/30 (06/07/21)    Time 4    Period Weeks    Status New    Target Date 07/05/21      PT LONG TERM GOAL #2   Title Patient will demo >105/120 on Modified CTSIB to improve static balance and proprioception    Baseline 85/120 (06/07/21)    Time 4    Period Weeks    Status New    Target Date 07/05/21      PT LONG TERM GOAL #3   Title Pt will be I and compliant with HEP to self manage symptoms at home    Baseline TBD    Time 4    Period Weeks    Status New    Target Date 07/05/21      PT LONG TERM GOAL #4   Title Pt will dem <14 sec with 5x sit to stand to improve functional strength    Baseline 20 sec (06/07/21)    Time 4    Period Weeks    Status New    Target Date 07/05/21                   Plan - 06/13/21 1405     Clinical Impression Statement Today's skilled session focused on review and addtions to HEP for strengthening and balance. Remainder of session continued to focus on balance training on compliant surfaces. No issues noted or reported in session. The pt is making progress toward goals and should benefit from continued PT to progress toward goals.    Personal Factors and Comorbidities Comorbidity 3+;Time since onset of injury/illness/exacerbation    Comorbidities complete heart block status post PPM 11/04/2019, aortic stenosis, essential hypertension, chronic systolic CHF, OSA on CPAP, type 2 diabetes, hypothyroidism, and dyspnea on exertion    Examination-Activity Limitations Carry;Stand;Squat    Examination-Participation Restrictions Cleaning;Community Activity;Laundry;Meal Prep;Yard Work    Stability/Clinical Decision Making Stable/Uncomplicated    Rehab  Potential Good    PT Frequency 2x / week    PT Duration 4 weeks    PT  Treatment/Interventions ADLs/Self Care Home Management;Cryotherapy;Moist Heat;Balance training;Therapeutic exercise;Therapeutic activities;Functional mobility training;Stair training;Gait training;Neuromuscular re-education;Patient/family education;Orthotic Fit/Training;Manual techniques;Energy conservation;Joint Manipulations;Passive range of motion;Vestibular    PT Next Visit Plan Continue to address balance training on compliant surface and with/without vision removed, single leg stance tasks, dynamic gait activities    PT Home Exercise Plan Access Code [email or web address]    Consulted and Agree with Plan of Care Patient             Patient will benefit from skilled therapeutic intervention in order to improve the following deficits and impairments:  Abnormal gait, Decreased balance, Decreased safety awareness, Decreased endurance, Decreased strength, Difficulty walking, Improper body mechanics, Postural dysfunction  Visit Diagnosis: Other abnormalities of gait and mobility     Problem List Patient Active Problem List   Diagnosis Date Noted   Chronic systolic CHF (congestive heart failure) ([place]) 11/04/2019   Pacemaker 11/04/2019   Dyspnea on exertion 09/18/2019   Pain in left foot 04/09/2019   Toe infection 06/10/2017   Ganglion cyst 12/19/2015   Aortic stenosis 09/01/2014   Essential hypertension 09/01/2014   Type 2 diabetes mellitus with peripheral neuropathy ([place]) 09/01/2014   Hypothyroidism 09/01/2014   OSA on CPAP 09/01/2014   CHB (complete heart block) ([place]) 08/11/2014   Complete heart block ([place]) 08/05/2014   Lumbar stenosis 07/16/2011   HNP (herniated nucleus pulposus), lumbar 07/16/2011    Willow Ora, PTA, [facility] Outpatient Neuro [facility] [address] [place], [place] [address] [phone] 06/14/21, 10:39 AM    Name: Tony [name] MRN: [id number] Date of Birth: 1950/07/17

## 2021-06-15 ENCOUNTER — Ambulatory Visit: Payer: Medicare PPO | Admitting: Physical Therapy

## 2021-06-15 ENCOUNTER — Other Ambulatory Visit: Payer: Self-pay

## 2021-06-15 ENCOUNTER — Encounter: Payer: Self-pay | Admitting: Physical Therapy

## 2021-06-15 DIAGNOSIS — R2689 Other abnormalities of gait and mobility: Secondary | ICD-10-CM | POA: Diagnosis not present

## 2021-06-15 NOTE — Therapy (Signed)
[facility] [address] [place], [place], [address] Phone: [phone]   Fax:  [phone]  Physical Therapy Treatment  Patient Details  Name: Tony [name] MRN: [id number] Date of Birth: 13-Jan-1951 Referring Provider (PT): Dr. Buddy Duty   Encounter Date: 06/15/2021   PT End of Session - 06/15/21 0933     Visit Number 3    Number of Visits 9    Date for PT Re-Evaluation 07/05/21    PT Start Time 0932    PT Stop Time 1014    PT Time Calculation (min) 42 min    Equipment Utilized During Treatment Gait belt    Activity Tolerance Patient tolerated treatment well    Behavior During Therapy [facility] for tasks assessed/performed             Past Medical History:  Diagnosis Date   Cancer ([place])    thyroid   Complete heart block ([place])    a. s/p MDT dual chamber PPM followed by Dr [name]    Complication of anesthesia    1985 after Thyriodectomy hard time waking up   Diabetes mellitus    GERD (gastroesophageal reflux disease)    Hypertension    Hypothyroidism    Had two surgeries for Cancer    Past Surgical History:  Procedure Laterality Date   LUMBAR LAMINECTOMY/DECOMPRESSION MICRODISCECTOMY  07/19/2011   Procedure: LUMBAR LAMINECTOMY/DECOMPRESSION MICRODISCECTOMY 3 LEVELS;  Surgeon: Melina Schools, MD;  Location: [facility];  Service: Orthopedics;  Laterality: Left;  Lumbar three-Lumbar five LEFT DECOMPRESSION AND FORAMINOTOMY Lumbar three-four LEFT DISCECTOMY   PERMANENT PACEMAKER INSERTION N/A 08/11/2014   MDT Adapta L implanted by Dr [name] for CHB   Thyroidectomy x2      There were no vitals filed for this visit.   Subjective Assessment - 06/15/21 0932     Subjective No new complaitns. No falls or pain to report. Was able to do the new ex's, sore afterwards, no other issues.    Patient Stated Goals improve balance    Currently in Pain? No/denies                    [facility] Adult PT Treatment/Exercise -  06/15/21 0934       Transfers   Transfers Sit to Stand;Stand to Sit    Sit to Stand 6: Modified independent (Device/Increase time)    Stand to Sit 6: Modified independent (Device/Increase time)      Ambulation/Gait   Ambulation/Gait Yes    Ambulation/Gait Assistance 5: Supervision    Ambulation Distance (Feet) --   around clinic with session   Assistive device None    Gait Pattern Step-through pattern;Decreased stride length;Trunk flexed    Ambulation Surface Level;Indoor      High Level Balance   High Level Balance Activities Side stepping;Marching forwards;Marching backwards;Tandem walking   tandem walking forward/backward   High Level Balance Comments on red/blue mats next to counter top: for 4 laps each with cues on form and technique, min guard to min assist for balance/safety with light to no UE support on counter.      Neuro Re-ed    Neuro Re-ed Details  for balance/muscle re-ed: standing across red foam beam- single UE support on counter for alternating forward stepping to floor<>back onto beam, then alternating backward stepping to floor<>back onto beam for ~10 reps each/each way with cues for increased step length/height. then with feet hip width apart standing across red beam with none to light UE  support for EC 30 sec's x 3 reps, progressing to EC head movements left<>right, up<>down for ~10 reps each. increased assistance needed with head movements. pt with posterior bias with balance loss, min to mod assist at times.            standing on airex with 2 tall cones on floor in front: alternating forward foot taps, cross foot taps, forward double foot taps, cross double foot taps for ~10 reps each. HHA on right side with min to mod assist for balance. cues for stance position, weight shifting, posture and increased hip/knee flexion.      Knee/Hip Exercises: Aerobic   Other Aerobic Scifit UE/LE level 5.5 x 8 minutes with goal >/= 100 steps per minute for strengthening and activity  tolerance.                       PT Short Term Goals - 06/07/21 1621       PT SHORT TERM GOAL #1   Title STG=LTG               PT Long Term Goals - 06/07/21 1621       PT LONG TERM GOAL #1   Title Pt will demo >26/30 on FGA to improve functional gait and balance    Baseline 23/30 (06/07/21)    Time 4    Period Weeks    Status New    Target Date 07/05/21      PT LONG TERM GOAL #2   Title Patient will demo >105/120 on Modified CTSIB to improve static balance and proprioception    Baseline 85/120 (06/07/21)    Time 4    Period Weeks    Status New    Target Date 07/05/21      PT LONG TERM GOAL #3   Title Pt will be I and compliant with HEP to self manage symptoms at home    Baseline TBD    Time 4    Period Weeks    Status New    Target Date 07/05/21      PT LONG TERM GOAL #4   Title Pt will dem <14 sec with 5x sit to stand to improve functional strength    Baseline 20 sec (06/07/21)    Time 4    Period Weeks    Status New    Target Date 07/05/21                   Plan - 06/15/21 0933     Clinical Impression Statement Skilled session continued to focus on activity tolerance, strengthening and balance training with no issues noted or reported in session. Pt continues to be challenged on compliant surfaces and with vision removed tasks. The pt should benefit from continued PT to progress toward unmet goals.    Personal Factors and Comorbidities Comorbidity 3+;Time since onset of injury/illness/exacerbation    Comorbidities complete heart block status post PPM 11/04/2019, aortic stenosis, essential hypertension, chronic systolic CHF, OSA on CPAP, type 2 diabetes, hypothyroidism, and dyspnea on exertion    Examination-Activity Limitations Carry;Stand;Squat    Examination-Participation Restrictions Cleaning;Community Activity;Laundry;Meal Prep;Yard Work    Stability/Clinical Decision Making Stable/Uncomplicated    Rehab Potential Good    PT Frequency  2x / week    PT Duration 4 weeks    PT Treatment/Interventions ADLs/Self Care Home Management;Cryotherapy;Moist Heat;Balance training;Therapeutic exercise;Therapeutic activities;Functional mobility training;Stair training;Gait training;Neuromuscular re-education;Patient/family education;Orthotic Fit/Training;Manual techniques;Energy conservation;Joint Manipulations;Passive range of motion;Vestibular  PT Next Visit Plan Continue to address balance training on compliant surface and with/without vision removed, single leg stance tasks, dynamic gait activities    PT Home Exercise Plan Access Code [email or web address]    Consulted and Agree with Plan of Care Patient             Patient will benefit from skilled therapeutic intervention in order to improve the following deficits and impairments:  Abnormal gait, Decreased balance, Decreased safety awareness, Decreased endurance, Decreased strength, Difficulty walking, Improper body mechanics, Postural dysfunction  Visit Diagnosis: Other abnormalities of gait and mobility     Problem List Patient Active Problem List   Diagnosis Date Noted   Chronic systolic CHF (congestive heart failure) ([place]) 11/04/2019   Pacemaker 11/04/2019   Dyspnea on exertion 09/18/2019   Pain in left foot 04/09/2019   Toe infection 06/10/2017   Ganglion cyst 12/19/2015   Aortic stenosis 09/01/2014   Essential hypertension 09/01/2014   Type 2 diabetes mellitus with peripheral neuropathy ([place]) 09/01/2014   Hypothyroidism 09/01/2014   OSA on CPAP 09/01/2014   CHB (complete heart block) ([place]) 08/11/2014   Complete heart block ([place]) 08/05/2014   Lumbar stenosis 07/16/2011   HNP (herniated nucleus pulposus), lumbar 07/16/2011    Willow Ora, PTA, [facility] Outpatient Neuro [facility] [address] [place], [place] [address] [phone] 06/15/21, 10:47 AM   Name: Tony [name] MRN: [id number] Date of Birth: [birth year]

## 2021-06-20 ENCOUNTER — Other Ambulatory Visit: Payer: Self-pay

## 2021-06-20 ENCOUNTER — Encounter: Payer: Self-pay | Admitting: Physical Therapy

## 2021-06-20 ENCOUNTER — Ambulatory Visit: Payer: Medicare PPO | Admitting: Physical Therapy

## 2021-06-20 DIAGNOSIS — R2689 Other abnormalities of gait and mobility: Secondary | ICD-10-CM

## 2021-06-20 NOTE — Therapy (Signed)
[facility] [address] [place], [place], [address] Phone: [phone]   Fax:  [phone]  Physical Therapy Treatment  Patient Details  Name: Tony [name] MRN: [id number] Date of Birth: 05-14-1950 Referring Provider (PT): Dr. Buddy Duty   Encounter Date: 06/20/2021   PT End of Session - 06/20/21 0851     Visit Number 4    Number of Visits 9    Date for PT Re-Evaluation 07/05/21    Authorization Type [employer]- waiting for auth    Progress Note Due on Visit 10    PT Start Time 0848    PT Stop Time 0928    PT Time Calculation (min) 40 min    Equipment Utilized During Treatment Gait belt    Activity Tolerance Patient tolerated treatment well    Behavior During Therapy [facility] for tasks assessed/performed             Past Medical History:  Diagnosis Date   Cancer ([place])    thyroid   Complete heart block ([place])    a. s/p MDT dual chamber PPM followed by Dr [name]    Complication of anesthesia    1985 after Thyriodectomy hard time waking up   Diabetes mellitus    GERD (gastroesophageal reflux disease)    Hypertension    Hypothyroidism    Had two surgeries for Cancer    Past Surgical History:  Procedure Laterality Date   LUMBAR LAMINECTOMY/DECOMPRESSION MICRODISCECTOMY  07/19/2011   Procedure: LUMBAR LAMINECTOMY/DECOMPRESSION MICRODISCECTOMY 3 LEVELS;  Surgeon: Melina Schools, MD;  Location: [facility];  Service: Orthopedics;  Laterality: Left;  Lumbar three-Lumbar five LEFT DECOMPRESSION AND FORAMINOTOMY Lumbar three-four LEFT DISCECTOMY   PERMANENT PACEMAKER INSERTION N/A 08/11/2014   MDT Adapta L implanted by Dr [name] for CHB   Thyroidectomy x2      There were no vitals filed for this visit.   Subjective Assessment - 06/20/21 0850     Subjective No new complaints. No falls or pain to report. Was able to do the new ex's, sore afterwards, no other issues.    Patient Stated Goals improve balance    Currently in Pain?  No/denies                     [facility] Adult PT Treatment/Exercise - 06/20/21 0851       Transfers   Transfers Sit to Stand;Stand to Sit    Sit to Stand 6: Modified independent (Device/Increase time)    Stand to Sit 6: Modified independent (Device/Increase time)      Ambulation/Gait   Ambulation/Gait Yes    Ambulation/Gait Assistance 5: Supervision    Ambulation/Gait Assistance Details cues for upright posture and reciprocal arm swing with gait.    Ambulation Distance (Feet) 500 Feet   x1 in/outdoors, plus around clinic with session   Assistive device None    Gait Pattern Step-through pattern;Decreased stride length;Trunk flexed    Ambulation Surface Level;Indoor;Unlevel;Outdoor;Paved      Knee/Hip Exercises: Aerobic   Other Aerobic Scifit UE/LE level 5.5 x 8 minutes with goal >/= 100 steps per minute for strengthening and activity tolerance.                 Balance Exercises - 06/20/21 0908       Balance Exercises: Standing   Standing Eyes Closed Foam/compliant surface;Narrow base of support (BOS);Wide (BOA);Head turns;Other reps (comment);30 secs;Limitations    Standing Eyes Closed Limitations on airex with no UE support, occasional touch  to sturdy surface: feet togther for EC 30 sec's x 3 reps, progressing to feet hip width apart for EC head movements left<>right, up<>down for ~10 reps each. min guard to min assist with posterior bias noted.    Partial Tandem Stance Eyes closed;Foam/compliant surface;Intermittent upper extremity support;2 reps;30 secs;Limitations    Partial Tandem Stance Limitations on airex with occasional touch to sturdy surface for 2 reps each foot forward. min guard to min assist for balance with cues on posture and weight shifting to assist with balance.    Step Over Hurdles / Cones hurdles of varied heights on red/blue mats: forward reciprocal stepping over them for 6 laps with light support on counter, then lateral stepping left<>right x 3  laps toward each way, min guard assist for safety.                  PT Short Term Goals - 06/07/21 1621       PT SHORT TERM GOAL #1   Title STG=LTG               PT Long Term Goals - 06/07/21 1621       PT LONG TERM GOAL #1   Title Pt will demo >26/30 on FGA to improve functional gait and balance    Baseline 23/30 (06/07/21)    Time 4    Period Weeks    Status New    Target Date 07/05/21      PT LONG TERM GOAL #2   Title Patient will demo >105/120 on Modified CTSIB to improve static balance and proprioception    Baseline 85/120 (06/07/21)    Time 4    Period Weeks    Status New    Target Date 07/05/21      PT LONG TERM GOAL #3   Title Pt will be I and compliant with HEP to self manage symptoms at home    Baseline TBD    Time 4    Period Weeks    Status New    Target Date 07/05/21      PT LONG TERM GOAL #4   Title Pt will dem <14 sec with 5x sit to stand to improve functional strength    Baseline 20 sec (06/07/21)    Time 4    Period Weeks    Status New    Target Date 07/05/21                   Plan - 06/20/21 0851     Clinical Impression Statement Today's skilled session continued to focus on strenthening and balance training, also addressed gait on various surfaces. No issues noted or reported in session. The pt is making progress toward goals and should benefit from continued PT to progress toward unmet goals.    Personal Factors and Comorbidities Comorbidity 3+;Time since onset of injury/illness/exacerbation    Comorbidities complete heart block status post PPM 11/04/2019, aortic stenosis, essential hypertension, chronic systolic CHF, OSA on CPAP, type 2 diabetes, hypothyroidism, and dyspnea on exertion    Examination-Activity Limitations Carry;Stand;Squat    Examination-Participation Restrictions Cleaning;Community Activity;Laundry;Meal Prep;Yard Work    Stability/Clinical Decision Making Stable/Uncomplicated    Rehab Potential Good    PT  Frequency 2x / week    PT Duration 4 weeks    PT Treatment/Interventions ADLs/Self Care Home Management;Cryotherapy;Moist Heat;Balance training;Therapeutic exercise;Therapeutic activities;Functional mobility training;Stair training;Gait training;Neuromuscular re-education;Patient/family education;Orthotic Fit/Training;Manual techniques;Energy conservation;Joint Manipulations;Passive range of motion;Vestibular    PT Next Visit Plan Continue to  address balance training on compliant surface and with/without vision removed, single leg stance tasks, dynamic gait activities    PT Home Exercise Plan Access Code [email or web address]    Consulted and Agree with Plan of Care Patient             Patient will benefit from skilled therapeutic intervention in order to improve the following deficits and impairments:  Abnormal gait, Decreased balance, Decreased safety awareness, Decreased endurance, Decreased strength, Difficulty walking, Improper body mechanics, Postural dysfunction  Visit Diagnosis: Other abnormalities of gait and mobility     Problem List Patient Active Problem List   Diagnosis Date Noted   Chronic systolic CHF (congestive heart failure) ([place]) 11/04/2019   Pacemaker 11/04/2019   Dyspnea on exertion 09/18/2019   Pain in left foot 04/09/2019   Toe infection 06/10/2017   Ganglion cyst 12/19/2015   Aortic stenosis 09/01/2014   Essential hypertension 09/01/2014   Type 2 diabetes mellitus with peripheral neuropathy ([place]) 09/01/2014   Hypothyroidism 09/01/2014   OSA on CPAP 09/01/2014   CHB (complete heart block) ([place]) 08/11/2014   Complete heart block ([place]) 08/05/2014   Lumbar stenosis 07/16/2011   HNP (herniated nucleus pulposus), lumbar 07/16/2011    Willow Ora, PTA, [facility] Outpatient Neuro [facility] [address] [place], [place] [address] [phone] 06/20/21, 9:34 PM   Name: Tony [name] MRN: [id number] Date of Birth: 1950/11/19

## 2021-06-22 ENCOUNTER — Encounter: Payer: Self-pay | Admitting: Physical Therapy

## 2021-06-22 ENCOUNTER — Other Ambulatory Visit: Payer: Self-pay

## 2021-06-22 ENCOUNTER — Ambulatory Visit: Payer: Medicare PPO | Admitting: Physical Therapy

## 2021-06-22 DIAGNOSIS — R2689 Other abnormalities of gait and mobility: Secondary | ICD-10-CM | POA: Diagnosis not present

## 2021-06-22 NOTE — Therapy (Signed)
[facility] [address] [place], [place], [address] Phone: [phone]   Fax:  [phone]  Physical Therapy Treatment  Patient Details  Name: Tony [name] MRN: [id number] Date of Birth: August 04, 1950 Referring Provider (PT): Dr. Buddy Duty   Encounter Date: 06/22/2021   PT End of Session - 06/22/21 0936     Visit Number 5    Number of Visits 9    Date for PT Re-Evaluation 07/05/21    Authorization Type [employer]- waiting for auth    Progress Note Due on Visit 10    PT Start Time 0934    PT Stop Time 1014    PT Time Calculation (min) 40 min    Equipment Utilized During Treatment Gait belt    Activity Tolerance Patient tolerated treatment well    Behavior During Therapy [facility] for tasks assessed/performed             Past Medical History:  Diagnosis Date   Cancer ([place])    thyroid   Complete heart block ([place])    a. s/p MDT dual chamber PPM followed by Dr [name]    Complication of anesthesia    1985 after Thyriodectomy hard time waking up   Diabetes mellitus    GERD (gastroesophageal reflux disease)    Hypertension    Hypothyroidism    Had two surgeries for Cancer    Past Surgical History:  Procedure Laterality Date   LUMBAR LAMINECTOMY/DECOMPRESSION MICRODISCECTOMY  07/19/2011   Procedure: LUMBAR LAMINECTOMY/DECOMPRESSION MICRODISCECTOMY 3 LEVELS;  Surgeon: Melina Schools, MD;  Location: [place];  Service: Orthopedics;  Laterality: Left;  Lumbar three-Lumbar five LEFT DECOMPRESSION AND FORAMINOTOMY Lumbar three-four LEFT DISCECTOMY   PERMANENT PACEMAKER INSERTION N/A 08/11/2014   MDT Adapta L implanted by Dr [name] for CHB   Thyroidectomy x2      There were no vitals filed for this visit.   Subjective Assessment - 06/22/21 0935     Subjective No new complaints. No falls. Did have a dizzy spell last night when he got up quickly after lying in bed to go to the bathroom. Cleared within a few minutes.    Patient  Stated Goals improve balance    Currently in Pain? No/denies                     [facility] Adult PT Treatment/Exercise - 06/22/21 0937       Transfers   Transfers Sit to Stand;Stand to Sit    Sit to Stand 6: Modified independent (Device/Increase time)    Stand to Sit 6: Modified independent (Device/Increase time)      Ambulation/Gait   Ambulation/Gait Yes    Ambulation/Gait Assistance 5: Supervision;4: Min guard    Ambulation Distance (Feet) --   around clinic with session   Assistive device None    Gait Pattern Step-through pattern;Decreased stride length;Trunk flexed    Ambulation Surface Level;Indoor      Neuro Re-ed    Neuro Re-ed Details  for balance/muscle re-ed/coordination: gait around track working on speed chnages, sudden stops/starts and scanning all directions randomly. min guard assist for safety with no balance issues noted.      Knee/Hip Exercises: Aerobic   Other Aerobic Scifit UE/LE level 6.5 x 4 minutes, then 6.0 x 4 minutes with goal >/= 100 steps per minute for strengthening and activity tolerance.                 Balance Exercises - 06/22/21 [phone]  Balance Exercises: Standing   Rockerboard Anterior/posterior;Lateral;EO;EC;30 seconds;Other reps (comment);Limitations    Rockerboard Limitations peformed both ways on balance board: rocking the board with emphasis on tall posture and weight shifting  with EO, progressing to EC with min guard to min assist for balance. Then holding the board steady for EC 30 sec's x 3 reps with cues on posture/anterior weight shifting to assist with balance, min to mod assist. Pt with significant posterior bias on complaint surfaces with EO and increased with EC.    Sit to Stand Standard surface;Foam/compliant surface;Limitations    Sit to Stand Limitations seated at edge of mat with feet across red foam beam: sit<>stands for 10 reps with no to light UE assist. cues/emphasis placed on anterior weight shifting to  decrease retropulsion on mat table.                  PT Short Term Goals - 06/07/21 1621       PT SHORT TERM GOAL #1   Title STG=LTG               PT Long Term Goals - 06/07/21 1621       PT LONG TERM GOAL #1   Title Pt will demo >26/30 on FGA to improve functional gait and balance    Baseline 23/30 (06/07/21)    Time 4    Period Weeks    Status New    Target Date 07/05/21      PT LONG TERM GOAL #2   Title Patient will demo >105/120 on Modified CTSIB to improve static balance and proprioception    Baseline 85/120 (06/07/21)    Time 4    Period Weeks    Status New    Target Date 07/05/21      PT LONG TERM GOAL #3   Title Pt will be I and compliant with HEP to self manage symptoms at home    Baseline TBD    Time 4    Period Weeks    Status New    Target Date 07/05/21      PT LONG TERM GOAL #4   Title Pt will dem <14 sec with 5x sit to stand to improve functional strength    Baseline 20 sec (06/07/21)    Time 4    Period Weeks    Status New    Target Date 07/05/21                   Plan - 06/22/21 0936     Clinical Impression Statement Today's skilled session continued to focus on strengthening and balance training with no issues noted or reported. The pt continues to demo a significant posterior bias on compliant surfaces both with vision and with vision removed. The pt should benefit from continued PT to progress toward unmet goals.    Personal Factors and Comorbidities Comorbidity 3+;Time since onset of injury/illness/exacerbation    Comorbidities complete heart block status post PPM 11/04/2019, aortic stenosis, essential hypertension, chronic systolic CHF, OSA on CPAP, type 2 diabetes, hypothyroidism, and dyspnea on exertion    Examination-Activity Limitations Carry;Stand;Squat    Examination-Participation Restrictions Cleaning;Community Activity;Laundry;Meal Prep;Yard Work    Stability/Clinical Decision Making Stable/Uncomplicated    Rehab  Potential Good    PT Frequency 2x / week    PT Duration 4 weeks    PT Treatment/Interventions ADLs/Self Care Home Management;Cryotherapy;Moist Heat;Balance training;Therapeutic exercise;Therapeutic activities;Functional mobility training;Stair training;Gait training;Neuromuscular re-education;Patient/family education;Orthotic Fit/Training;Manual techniques;Energy conservation;Joint Manipulations;Passive range of motion;Vestibular  PT Next Visit Plan work on balance on ramp - solid surface>compliant surface, EO and EC and stepping strategies; continue to work on dynamic gait acitivities progressing to compliant surfaces, single leg stance activities    PT Home Exercise Plan Access Code [email or web address]    Consulted and Agree with Plan of Care Patient             Patient will benefit from skilled therapeutic intervention in order to improve the following deficits and impairments:  Abnormal gait, Decreased balance, Decreased safety awareness, Decreased endurance, Decreased strength, Difficulty walking, Improper body mechanics, Postural dysfunction  Visit Diagnosis: Other abnormalities of gait and mobility     Problem List Patient Active Problem List   Diagnosis Date Noted   Chronic systolic CHF (congestive heart failure) ([place]) 11/04/2019   Pacemaker 11/04/2019   Dyspnea on exertion 09/18/2019   Pain in left foot 04/09/2019   Toe infection 06/10/2017   Ganglion cyst 12/19/2015   Aortic stenosis 09/01/2014   Essential hypertension 09/01/2014   Type 2 diabetes mellitus with peripheral neuropathy ([place]) 09/01/2014   Hypothyroidism 09/01/2014   OSA on CPAP 09/01/2014   CHB (complete heart block) ([place]) 08/11/2014   Complete heart block ([place]) 08/05/2014   Lumbar stenosis 07/16/2011   HNP (herniated nucleus pulposus), lumbar 07/16/2011    Willow Ora, PTA, [facility] Outpatient Neuro [facility] [address], [place], [place] [address] [phone] 06/22/21, 7:44 PM   Name: Tony [name] MRN: [id number] Date of Birth: [birth year]

## 2021-06-28 ENCOUNTER — Other Ambulatory Visit: Payer: Self-pay

## 2021-06-28 ENCOUNTER — Ambulatory Visit: Payer: Medicare PPO | Attending: Geriatric Medicine | Admitting: Physical Therapy

## 2021-06-28 ENCOUNTER — Encounter: Payer: Self-pay | Admitting: Physical Therapy

## 2021-06-28 DIAGNOSIS — R2689 Other abnormalities of gait and mobility: Secondary | ICD-10-CM | POA: Insufficient documentation

## 2021-06-28 NOTE — Therapy (Signed)
[facility] [address] [place], [place], [address] Phone: [phone]   Fax:  [phone]  Physical Therapy Treatment  Patient Details  Name: Tony [name] MRN: [id number] Date of Birth: 30-Jun-1950 Referring Provider (PT): Dr. Buddy Duty   Encounter Date: 06/28/2021   PT End of Session - 06/28/21 0938     Visit Number 6    Number of Visits 9    Date for PT Re-Evaluation 07/05/21    Authorization Type [employer]- waiting for auth    Progress Note Due on Visit 10    PT Start Time 0932    PT Stop Time 1015    PT Time Calculation (min) 43 min    Equipment Utilized During Treatment --    Activity Tolerance Patient tolerated treatment well    Behavior During Therapy [facility] for tasks assessed/performed             Past Medical History:  Diagnosis Date   Cancer ([place])    thyroid   Complete heart block ([place])    a. s/p MDT dual chamber PPM followed by Dr [name]    Complication of anesthesia    1985 after Thyriodectomy hard time waking up   Diabetes mellitus    GERD (gastroesophageal reflux disease)    Hypertension    Hypothyroidism    Had two surgeries for Cancer    Past Surgical History:  Procedure Laterality Date   LUMBAR LAMINECTOMY/DECOMPRESSION MICRODISCECTOMY  07/19/2011   Procedure: LUMBAR LAMINECTOMY/DECOMPRESSION MICRODISCECTOMY 3 LEVELS;  Surgeon: Melina Schools, MD;  Location: [place];  Service: Orthopedics;  Laterality: Left;  Lumbar three-Lumbar five LEFT DECOMPRESSION AND FORAMINOTOMY Lumbar three-four LEFT DISCECTOMY   PERMANENT PACEMAKER INSERTION N/A 08/11/2014   MDT Adapta L implanted by Dr [name] for CHB   Thyroidectomy x2      There were no vitals filed for this visit.   Subjective Assessment - 06/28/21 0935     Subjective No new complaints. Had a busy weekend and was very active, then went to sister in laws on Monday near [place] [facility] [place] (his sister drove), "paid for it on Tuesday", "I could barely  walk".  No dizziness, was fatigued and sore. Flexaril helped him    Patient Stated Goals improve balance    Currently in Pain? No/denies                [facility] PT Assessment - 06/28/21 0938       Standardized Balance Assessment   Standardized Balance Assessment Five Times Sit to Stand;10 meter walk test    Five times sit to stand comments  10.03 seconds no UE support from standard height surface                   [facility] Adult PT Treatment/Exercise - 06/28/21 0938       Transfers   Transfers Sit to Stand;Stand to Sit    Sit to Stand 6: Modified independent (Device/Increase time)    Stand to Sit 6: Modified independent (Device/Increase time)      Ambulation/Gait   Ambulation/Gait Yes    Ambulation/Gait Assistance 5: Supervision    Ambulation Distance (Feet) 500 Feet   x1 in/outdoors, plus around gym with session   Assistive device None    Gait Pattern Step-through pattern;Decreased stride length;Trunk flexed    Ambulation Surface Level;Indoor;Unlevel;Outdoor;Paved      Knee/Hip Exercises: Aerobic   Other Aerobic Scifit UE/LE level 6.0 x 8 minutes,  with goal >/= 100 steps  per minute for strengthening and activity tolerance.                 Balance Exercises - 06/28/21 0956       Balance Exercises: Standing   Standing Eyes Closed Narrow base of support (BOS);Wide (BOA);Head turns;Foam/compliant surface;Other reps (comment);30 secs;Limitations    Standing Eyes Closed Limitations standing across blue foam beam with feet hip width apart for EC 30 seconds x 3 reps, then with feet wider apart for EC head movements left<>right, then up<>down for ~10 reps each. min to mod assist with intermittent touch to bars. posterior bias with balance loss. cues on posture and weight shifting to correct this.    Balance Beam standing across blue foam beam: with light progressing to no support on bars with occasional touch- alternating forward stepping to floor/back onto beam, then  alternating backward stepping to floor/back onto beam for ~10 reps each/each way. cues needed to maintain wider base of support as feet tendended to move to very narrow/touching each other and for increased step lenght/height to clear beam surface. min guard to min assist for balance.    Tandem Gait Forward;Retro;Upper extremity support;Foam/compliant surface;4 reps;Limitations    Tandem Gait Limitations on blue foam beam with light support on bars for balance, cues for step placement on beam. min guard assist for safety.    Sidestepping Foam/compliant support;Upper extremity support;4 reps;Limitations    Sidestepping Limitations on blue foam beam with light support on beam, cues for forward gaze and to lift feet with stepping. min guard assit for safety.                  PT Short Term Goals - 06/07/21 1621       PT SHORT TERM GOAL #1   Title STG=LTG               PT Long Term Goals - 06/28/21 1052       PT LONG TERM GOAL #1   Title Pt will demo >26/30 on FGA to improve functional gait and balance    Baseline 23/30 (06/07/21)    Time 4    Period Weeks    Status On-going    Target Date 07/05/21      PT LONG TERM GOAL #2   Title Patient will demo >105/120 on Modified CTSIB to improve static balance and proprioception    Baseline 85/120 (06/07/21)    Time 4    Period Weeks    Status On-going    Target Date 07/05/21      PT LONG TERM GOAL #3   Title Pt will be I and compliant with HEP to self manage symptoms at home    Baseline TBD    Time 4    Period Weeks    Status On-going    Target Date 07/05/21      PT LONG TERM GOAL #4   Title Pt will dem <14 sec with 5x sit to stand to improve functional strength    Baseline 06/28/21: 10.03 seconds no UE support from standard height surface    Status Achieved                   Plan - 06/28/21 0938     Clinical Impression Statement Skilled session continued to focus on strengthening, activity tolerance and balance  training on compliant surfaces. No issues noted or reported by pt in session. The pt has met his 5 time sit to stand goal as of  today. The pt is progressing and should benefit from continued PT to progress toward unmet goals.    Personal Factors and Comorbidities Comorbidity 3+;Time since onset of injury/illness/exacerbation    Comorbidities complete heart block status post PPM 11/04/2019, aortic stenosis, essential hypertension, chronic systolic CHF, OSA on CPAP, type 2 diabetes, hypothyroidism, and dyspnea on exertion    Examination-Activity Limitations Carry;Stand;Squat    Examination-Participation Restrictions Cleaning;Community Activity;Laundry;Meal Prep;Yard Work    Stability/Clinical Decision Making Stable/Uncomplicated    Rehab Potential Good    PT Frequency 2x / week    PT Duration 4 weeks    PT Treatment/Interventions ADLs/Self Care Home Management;Cryotherapy;Moist Heat;Balance training;Therapeutic exercise;Therapeutic activities;Functional mobility training;Stair training;Gait training;Neuromuscular re-education;Patient/family education;Orthotic Fit/Training;Manual techniques;Energy conservation;Joint Manipulations;Passive range of motion;Vestibular    PT Next Visit Plan pt has 2 appointments left-begin to check goals for recert vs discharge    PT Home Exercise Plan Access Code [email or web address]    Consulted and Agree with Plan of Care Patient             Patient will benefit from skilled therapeutic intervention in order to improve the following deficits and impairments:  Abnormal gait, Decreased balance, Decreased safety awareness, Decreased endurance, Decreased strength, Difficulty walking, Improper body mechanics, Postural dysfunction  Visit Diagnosis: Other abnormalities of gait and mobility     Problem List Patient Active Problem List   Diagnosis Date Noted   Chronic systolic CHF (congestive heart failure) ([place]) 11/04/2019   Pacemaker 11/04/2019   Dyspnea on exertion  09/18/2019   Pain in left foot 04/09/2019   Toe infection 06/10/2017   Ganglion cyst 12/19/2015   Aortic stenosis 09/01/2014   Essential hypertension 09/01/2014   Type 2 diabetes mellitus with peripheral neuropathy ([place]) 09/01/2014   Hypothyroidism 09/01/2014   OSA on CPAP 09/01/2014   CHB (complete heart block) ([place]) 08/11/2014   Complete heart block ([place]) 08/05/2014   Lumbar stenosis 07/16/2011   HNP (herniated nucleus pulposus), lumbar 07/16/2011    Willow Ora, PTA, [facility] Outpatient Neuro [facility] [address] [place], [place] [address] [phone] 06/28/21, 10:54 AM   Name: Tony [name] MRN: [id number] Date of Birth: 05-16-1950

## 2021-06-29 NOTE — Therapy (Deleted)
.  [facility] 

## 2021-06-30 ENCOUNTER — Other Ambulatory Visit: Payer: Self-pay

## 2021-06-30 ENCOUNTER — Ambulatory Visit: Payer: Medicare PPO

## 2021-06-30 DIAGNOSIS — R2689 Other abnormalities of gait and mobility: Secondary | ICD-10-CM | POA: Diagnosis not present

## 2021-06-30 NOTE — Therapy (Signed)
[facility] ?[place], [place], [address] ?Phone: [phone]   Fax:  [phone] ? ?Physical Therapy Discharge Summary ? ?Patient Details  ?Name: Tony [name] ?MRN: [id number] ?Date of Birth: 06-12-1950 ?Referring Provider (PT): Dr. Buddy Duty ? ? ?Encounter Date: 06/30/2021 ? ? PT End of Session - 06/30/21 0850   ? ? Visit Number 7   ? Number of Visits 9   ? Date for PT Re-Evaluation 07/05/21   ? Authorization Type [employer]- waiting for auth   ? Progress Note Due on Visit 10   ? PT Start Time 0845   ? PT Stop Time 0930   ? PT Time Calculation (min) 45 min   ? Activity Tolerance Patient tolerated treatment well   ? Behavior During Therapy [facility] for tasks assessed/performed   ? ?  ?  ? ?  ? ? ?Past Medical History:  ?Diagnosis Date  ? Cancer [facility])   ? thyroid  ? Complete heart block ([place])   ? a. s/p MDT dual chamber PPM followed by Dr [name]   ? Complication of anesthesia   ? 1985 after Thyriodectomy hard time waking up  ? Diabetes mellitus   ? GERD (gastroesophageal reflux disease)   ? Hypertension   ? Hypothyroidism   ? Had two surgeries for Cancer  ? ? ?Past Surgical History:  ?Procedure Laterality Date  ? LUMBAR LAMINECTOMY/DECOMPRESSION MICRODISCECTOMY  07/19/2011  ? Procedure: LUMBAR LAMINECTOMY/DECOMPRESSION MICRODISCECTOMY 3 LEVELS;  Surgeon: Melina Schools, MD;  Location: [facility];  Service: Orthopedics;  Laterality: Left;  Lumbar three-Lumbar five LEFT DECOMPRESSION AND FORAMINOTOMY Lumbar three-four LEFT DISCECTOMY  ? PERMANENT PACEMAKER INSERTION N/A 08/11/2014  ? MDT Adapta L implanted by Dr [name] for CHB  ? Thyroidectomy x2    ? ? ?There were no vitals filed for this visit. ? ? Subjective Assessment - 06/30/21 0850   ? ? Subjective No new complaints.He feels like he has gottent stronger and he is compliant with exercises.   ? Patient Stated Goals improve balance   ? ?  ?  ? ?  ? ? ? ? ? [facility] PT Assessment - 06/30/21 0001   ? ?  ? Functional  Gait  Assessment  ? Gait assessed  Yes   ? Gait Level Surface Walks 20 ft in less than 5.5 sec, no assistive devices, good speed, no evidence for imbalance, normal gait pattern, deviates no more than 6 in outside of the 12 in walkway width.   ? Change in Gait Speed Able to smoothly change walking speed without loss of balance or gait deviation. Deviate no more than 6 in outside of the 12 in walkway width.   ? Gait with Horizontal Head Turns Performs head turns smoothly with slight change in gait velocity (eg, minor disruption to smooth gait path), deviates 6-10 in outside 12 in walkway width, or uses an assistive device.   ? Gait with Vertical Head Turns Performs task with slight change in gait velocity (eg, minor disruption to smooth gait path), deviates 6 - 10 in outside 12 in walkway width or uses assistive device   ? Gait and Pivot Turn Pivot turns safely within 3 sec and stops quickly with no loss of balance.   ? Step Over Obstacle Is able to step over 2 stacked shoe boxes taped together (9 in total height) without changing gait speed. No evidence of imbalance.   ? Gait with Narrow Base of Support Is able to ambulate for  10 steps heel to toe with no staggering.   ? Gait with Eyes Closed Walks 20 ft, no assistive devices, good speed, no evidence of imbalance, normal gait pattern, deviates no more than 6 in outside 12 in walkway width. Ambulates 20 ft in less than 7 sec.   ? Ambulating Backwards Walks 20 ft, no assistive devices, good speed, no evidence for imbalance, normal gait   ? Steps Alternating feet, must use rail.   ? Total Score 27   ? FGA comment: 27/30   ? ?  ?  ? ?  ? ? ? ?MTCSIB: 99 sec ?Test 1: 30 sec ?Test 2: 30 sec ?Test 3: 30 sec ?Test 4: 9 sec ? ?FGA performed ? ?Gait training: Cues for improved posture and arm swings. ?Standing lumbar extensions to improve forward trunk posturing ? ?Updated HEP and reviewed them with patient verbally and with demonstration. ?PT educated on gradually doing 20-30  min of walking 3-5x/day as tolerated ?Discussed discharged planning with patient. ? ? ? ? ? ? ? ? ? ? ? ? ? ? ? ? ? ? ? ? ? ? ? ? PT Short Term Goals - 06/07/21 1621   ? ?  ? PT SHORT TERM GOAL #1  ? Title STG=LTG   ? ?  ?  ? ?  ? ? ? ? PT Long Term Goals - 06/30/21 0853   ? ?  ? PT LONG TERM GOAL #1  ? Title Pt will demo >26/30 on FGA to improve functional gait and balance   ? Baseline 23/30 (06/07/21); 27/30 (06/30/21)   ? Time 4   ? Period Weeks   ? Status Achieved   ? Target Date 07/05/21   ?  ? PT LONG TERM GOAL #2  ? Title Patient will demo >105/120 on Modified CTSIB to improve static balance and proprioception   ? Baseline 85/120 (06/07/21); 99/120 (06/30/21)   ? Time 4   ? Period Weeks   ? Status Not Met   ? Target Date 07/05/21   ?  ? PT LONG TERM GOAL #3  ? Title Pt will be I and compliant with HEP to self manage symptoms at home   ? Baseline TBD; pt is compliant 06/30/21   ? Time 4   ? Period Weeks   ? Status Achieved   ? Target Date 07/05/21   ?  ? PT LONG TERM GOAL #4  ? Title Pt will dem <14 sec with 5x sit to stand to improve functional strength   ? Baseline 06/28/21: 10.03 seconds no UE support from standard height surface   ? Status Achieved   ? ?  ?  ? ?  ? ? ? ? ? ? ? ? Plan - 06/30/21 0931   ? ? Clinical Impression Statement Patient has been seen for total of 7 sessions from 06/07/21 to 06/30/21. Patient has met 3/4 of his long term goals and is willing to work on remaining deficits with independent home exercise program. patient will be discharged from skilled PT.   ? PT Next Visit Plan Pt discharged   ? ?  ?  ? ?  ? ? ?Patient will benefit from skilled therapeutic intervention in order to improve the following deficits and impairments:    ? ?Visit Diagnosis: ?Other abnormalities of gait and mobility ? ? ? ? ?Problem List ?Patient Active Problem List  ? Diagnosis Date Noted  ? Chronic systolic CHF (congestive heart failure) ([place]) 11/04/2019  ?  Pacemaker 11/04/2019  ? Dyspnea on exertion 09/18/2019  ? Pain in  left foot 04/09/2019  ? Toe infection 06/10/2017  ? Ganglion cyst 12/19/2015  ? Aortic stenosis 09/01/2014  ? Essential hypertension 09/01/2014  ? Type 2 diabetes mellitus with peripheral neuropathy ([place]) 09/01/2014  ? Hypothyroidism 09/01/2014  ? OSA on CPAP 09/01/2014  ? CHB (complete heart block) ([place]) 08/11/2014  ? Complete heart block ([place]) 08/05/2014  ? Lumbar stenosis 07/16/2011  ? HNP (herniated nucleus pulposus), lumbar 07/16/2011  ? ? ?Kerrie Pleasure, PT ?06/30/2021, 9:34 AM ? ?[facility] ?[place], [place], [address] ?Phone: [phone]   Fax:  [phone] ? ?Name: Tony [name] ?MRN: [id number] ?Date of Birth: 1951/04/04 ? ? ? ?

## 2021-07-03 ENCOUNTER — Ambulatory Visit: Payer: Medicare PPO

## 2021-07-05 ENCOUNTER — Ambulatory Visit: Payer: Medicare PPO

## 2021-07-07 ENCOUNTER — Ambulatory Visit: Payer: Medicare PPO

## 2021-07-14 DIAGNOSIS — E1121 Type 2 diabetes mellitus with diabetic nephropathy: Secondary | ICD-10-CM | POA: Diagnosis not present

## 2021-07-14 DIAGNOSIS — I5022 Chronic systolic (congestive) heart failure: Secondary | ICD-10-CM | POA: Diagnosis not present

## 2021-07-14 DIAGNOSIS — Z950 Presence of cardiac pacemaker: Secondary | ICD-10-CM | POA: Diagnosis not present

## 2021-07-14 DIAGNOSIS — I10 Essential (primary) hypertension: Secondary | ICD-10-CM | POA: Diagnosis not present

## 2021-07-21 ENCOUNTER — Ambulatory Visit: Payer: Medicare PPO | Admitting: Cardiovascular Disease

## 2021-07-21 ENCOUNTER — Other Ambulatory Visit: Payer: Self-pay

## 2021-07-21 ENCOUNTER — Encounter: Payer: Self-pay | Admitting: Cardiovascular Disease

## 2021-07-21 VITALS — BP 124/80 | HR 73 | Ht 72.0 in | Wt 247.0 lb

## 2021-07-21 DIAGNOSIS — E119 Type 2 diabetes mellitus without complications: Secondary | ICD-10-CM | POA: Diagnosis not present

## 2021-07-21 DIAGNOSIS — E039 Hypothyroidism, unspecified: Secondary | ICD-10-CM | POA: Diagnosis not present

## 2021-07-21 DIAGNOSIS — Z01810 Encounter for preprocedural cardiovascular examination: Secondary | ICD-10-CM

## 2021-07-21 DIAGNOSIS — R011 Cardiac murmur, unspecified: Secondary | ICD-10-CM

## 2021-07-21 DIAGNOSIS — R5383 Other fatigue: Secondary | ICD-10-CM

## 2021-07-21 DIAGNOSIS — I350 Nonrheumatic aortic (valve) stenosis: Secondary | ICD-10-CM

## 2021-07-21 DIAGNOSIS — I501 Left ventricular failure: Secondary | ICD-10-CM

## 2021-07-21 DIAGNOSIS — R0609 Other forms of dyspnea: Secondary | ICD-10-CM

## 2021-07-21 DIAGNOSIS — Z01812 Encounter for preprocedural laboratory examination: Secondary | ICD-10-CM | POA: Diagnosis not present

## 2021-07-21 DIAGNOSIS — G4733 Obstructive sleep apnea (adult) (pediatric): Secondary | ICD-10-CM

## 2021-07-21 DIAGNOSIS — I5022 Chronic systolic (congestive) heart failure: Secondary | ICD-10-CM | POA: Diagnosis not present

## 2021-07-21 LAB — CBC
Hematocrit: 41.4 % (ref 37.5–51.0)
Hemoglobin: 13.9 g/dL (ref 13.0–17.7)
MCH: 30.3 pg (ref 26.6–33.0)
MCHC: 33.6 g/dL (ref 31.5–35.7)
MCV: 90 fL (ref 79–97)
Platelets: 209 10*3/uL (ref 150–450)
RBC: 4.59 x10E6/uL (ref 4.14–5.80)
RDW: 13.6 % (ref 11.6–15.4)
WBC: 9.4 10*3/uL (ref 3.4–10.8)

## 2021-07-21 LAB — BASIC METABOLIC PANEL
BUN/Creatinine Ratio: 11 (ref 10–24)
BUN: 16 mg/dL (ref 8–27)
CO2: 26 mmol/L (ref 20–29)
Calcium: 9.4 mg/dL (ref 8.6–10.2)
Chloride: 100 mmol/L (ref 96–106)
Creatinine, Ser: 1.45 mg/dL — ABNORMAL HIGH (ref 0.76–1.27)
Glucose: 205 mg/dL — ABNORMAL HIGH (ref 70–99)
Potassium: 4 mmol/L (ref 3.5–5.2)
Sodium: 138 mmol/L (ref 134–144)
eGFR: 51 mL/min/{1.73_m2} — ABNORMAL LOW (ref >59)

## 2021-07-21 NOTE — Progress Notes (Addendum)
?Cardiology Office Note:   ? ?Date:  07/22/2021  ? ?ID:  Tony [name], DOB 1951/01/26, MRN [id number] ? ?PCP:  Lajean Manes, MD ?  ?[facility] Providers ?Cardiologist:  [name], MD ?Electrophysiologist:  [name], MD    ? ?Referring MD: Deberah Pelton, NP  ? ?Chief Complaint  ?Patient presents with  ? Shortness of Breath  ? ? ?History of Present Illness:   ? ?Tony [name] is a 71 y.o. male referred for evaluation of severe aortic stenosis by Odie Sera, NP, and [name].  The patient has multiple comorbid medical conditions, including complete heart block status post permanent pacemaker in 6237, chronic systolic heart failure, type 2 diabetes, obstructive sleep apnea, and hypothyroidism. ? ?The patient is here with his daughter today. He's known of a heart murmur now for approximately 25 years. He has limitation from gait instability as well.  He is done some physical therapy that is helped with this.  Patient recently went to a basketball game at the [facility] and was short of breath walking up the ramp and became more short of breath walking up the steps to see.  He had to stop and rest.  States this was the first time he has been really limited by shortness of breath but he has noticed that this is been more problematic for him over the past few months.  He has not had any chest pain or pressure.  He does have some postural lightheadedness but has not had presyncope or frank syncope. ? ?In review of the patient's chart over time, his LVEF was normal at 60 to 65% in 2018.  In 2019 his LVEF was 55% with peak and mean transaortic gradients of 52 and 20 mmHg at that time.  In 2020 his LVEF had dropped to 50 to 55% with peak and mean gradients of 57 and 28 mmHg, respectively.  In 2021 his LVEF decreased further to 40 to 45% with peak and mean gradients of 43 and 23 mmHg, respectively.  In January 2023, his LVEF remained stable at 40 to 45% with peak and mean transaortic gradients of  46 and 25 mmHg, respectively.  The dimensionless index is 0.24 and stroke-volume index is 35. ? ?Past Medical History:  ?Diagnosis Date  ? Cancer [facility])   ? thyroid  ? Complete heart block ([place])   ? a. s/p MDT dual chamber PPM followed by Dr [name]   ? Complication of anesthesia   ? 1985 after Thyriodectomy hard time waking up  ? Diabetes mellitus   ? GERD (gastroesophageal reflux disease)   ? Hypertension   ? Hypothyroidism   ? Had two surgeries for Cancer  ? ? ?Past Surgical History:  ?Procedure Laterality Date  ? LUMBAR LAMINECTOMY/DECOMPRESSION MICRODISCECTOMY  07/19/2011  ? Procedure: LUMBAR LAMINECTOMY/DECOMPRESSION MICRODISCECTOMY 3 LEVELS;  Surgeon: Melina Schools, MD;  Location: [place];  Service: Orthopedics;  Laterality: Left;  Lumbar three-Lumbar five LEFT DECOMPRESSION AND FORAMINOTOMY Lumbar three-four LEFT DISCECTOMY  ? PERMANENT PACEMAKER INSERTION N/A 08/11/2014  ? MDT Adapta L implanted by Dr [name] for CHB  ? Thyroidectomy x2    ? ? ?Current Medications: ?Current Meds  ?Medication Sig  ? acetaminophen (TYLENOL) 500 MG tablet Take 500 mg by mouth every 6 (six) hours as needed (pain).   ? aspirin EC 81 MG tablet Take 81 mg by mouth daily.  ? atorvastatin (LIPITOR) 40 MG tablet Take by mouth.  ? BD PEN NEEDLE NANO 2ND GEN 32G X  4 MM MISC   ? carvedilol (COREG) 12.5 MG tablet Take 6.25 mg by mouth 2 (two) times daily.  ? carvedilol (COREG) 6.25 MG tablet TAKE 1 TABLET(6.25 MG) BY MOUTH TWICE DAILY  ? Continuous Blood Gluc Receiver (FREESTYLE LIBRE 2 READER) DEVI by Does not apply route.  ? cyclobenzaprine (FLEXERIL) 10 MG tablet Take 1 tablet by mouth 3 (three) times daily as needed.  ? famotidine (PEPCID) 20 MG tablet Take 20 mg by mouth daily.  ? gabapentin (NEURONTIN) 100 MG capsule Take 2 capsules (200 mg total) by mouth at bedtime. (Patient taking differently: Take 100 mg by mouth at bedtime.)  ? ibuprofen (ADVIL,MOTRIN) 800 MG tablet Take 800 mg by mouth every 8 (eight) hours as needed. For pain.  ?  insulin glargine (LANTUS SOLOSTAR) 100 UNIT/ML Solostar Pen 40 units in morning and 40 units at bedtime  ? INVOKANA 300 MG TABS tablet Take by mouth.  ? Lancets (ONETOUCH DELICA PLUS [id number]) MISC Apply topically.  ? levothyroxine (SYNTHROID, LEVOTHROID) 175 MCG tablet Take 175 mcg by mouth daily before breakfast.   ? Multiple Vitamins-Minerals (SENIOR MULTIVITAMIN PLUS PO) Take by mouth daily.  ? mupirocin ointment (BACTROBAN) 2 % Apply 1 application topically as needed.   ? sacubitril-valsartan (ENTRESTO) 49-51 MG Take 1 tablet by mouth 2 (two) times daily.  ? sertraline (ZOLOFT) 100 MG tablet Take 200 mg by mouth daily.   ? Tamsulosin HCl (FLOMAX) 0.4 MG CAPS Take 1 capsule (0.4 mg total) by mouth every morning.  ? TRULICITY 3 OQ/9.4TM SOPN once a week.  ?  ? ?Allergies:   Corticosteroids, Codeine, Crestor [rosuvastatin calcium], Metformin hcl, and Simvastatin  ? ?Social History  ? ?Socioeconomic History  ? Marital status: Married  ?  Spouse name: jody  ? Number of children: 2  ? Years of education: college  ? Highest education level: Not on file  ?Occupational History  ? Occupation: retired  ?Tobacco Use  ? Smoking status: Never  ? Smokeless tobacco: Never  ?Vaping Use  ? Vaping Use: Never used  ?Substance and Sexual Activity  ? Alcohol use: No  ?  Alcohol/week: 0.0 standard drinks  ? Drug use: No  ? Sexual activity: Not on file  ?Other Topics Concern  ? Not on file  ?Social History Narrative  ? Not on file  ? ?Social Determinants of Health  ? ?Financial Resource Strain: Not on file  ?Food Insecurity: Not on file  ?Transportation Needs: Not on file  ?Physical Activity: Not on file  ?Stress: Not on file  ?Social Connections: Not on file  ?  ? ?Family History: ?The patient's family history includes Healthy in his daughter, sister, and son; Heart attack in his father; Heart disease in his father; Lung cancer in his mother. There is no history of Anesthesia problems, Hypotension, or Pseudochol deficiency. ? ?ROS:    ?Please see the history of present illness.    ?All other systems reviewed and are negative. ? ?EKGs/Labs/Other Studies Reviewed:   ? ?The following studies were reviewed today: ?Echo 05/04/2021: ?1. Left ventricular ejection fraction, by estimation, is 40 to 45%. Left  ?ventricular ejection fraction by 3D volume is 44 %. The left ventricle has  ?mildly decreased function. The left ventricle demonstrates global  ?hypokinesis. There is severe asymmetric  ? left ventricular hypertrophy of the basal-septal segment. Left  ?ventricular diastolic parameters are consistent with Grade I diastolic  ?dysfunction (impaired relaxation). Elevated left ventricular end-diastolic  ?pressure. The E/e' is 64.  ?  2. Right ventricular systolic function is low normal. The right  ?ventricular size is normal. There is normal pulmonary artery systolic  ?pressure. The estimated right ventricular systolic pressure is 29.7 mmHg.  ? 3. Left atrial size was mildly dilated.  ? 4. The mitral valve is abnormal. Mild to moderate mitral valve  ?regurgitation.  ? 5. Calcification and reduced mobility of the right coronary cusp. The  ?aortic valve is tricuspid. Aortic valve regurgitation is trivial. Moderate  ?aortic valve stenosis. Aortic regurgitation PHT measures 621 msec. Aortic  ?valve area, by VTI measures 1.02  ?cm?Marland Kitchen Aortic valve mean gradient measures 25.0 mmHg. Aortic valve Vmax  ?measures 3.38 m/s. DI is 0.24.  ? 6. Aortic dilatation noted. There is mild dilatation of the ascending  ?aorta, measuring 41 mm.  ? 7. The inferior vena cava is normal in size with greater than 50%  ?respiratory variability, suggesting right atrial pressure of 3 mmHg.  ? 8. Cannot exclude a small PFO.  ? 9. Rhythm strip during this exam demostrated a paced rhythm.  ? ?Comparison(s): Changes from prior study are noted. 04/14/2020: LVEF  ?40-45%, severe LVH, mild LAE, moderate AS- mean gradient 31.6 mmHg,  ?dilated aorta to 40 mm.  ? ?LEFT VENTRICLE  ?PLAX 2D  ?LVIDd:          3.30 cm         Diastology  ?LVIDs:         2.60 cm         LV e' medial:    4.68 cm/s  ?LV PW:         1.70 cm         LV E/e' medial:  13.9  ?LV IVS:        1.80 cm         LV e' lateral:

## 2021-07-21 NOTE — Patient Instructions (Signed)
Medication Instructions:  ?Your physician recommends that you continue on your current medications as directed. Please refer to the Current Medication list given to you today. ? ?*If you need a refill on your cardiac medications before your next appointment, please call your pharmacy* ? ? ?Lab Work: ?CBC, BMET Today ?If you have labs (blood work) drawn today and your tests are completely normal, you will receive your results only by: ?MyChart Message (if you have MyChart) OR ?A paper copy in the mail ?If you have any lab test that is abnormal or we need to change your treatment, we will call you to review the results. ? ? ?Testing/Procedures: ?R & L heart Catheterization ? ? ?Follow-Up: ?At [facility], you and your health needs are our priority.  As part of our continuing mission to provide you with exceptional heart care, we have created designated Provider Care Teams.  These Care Teams include your primary Cardiologist (physician) and Advanced Practice Providers (APPs -  Physician Assistants and Nurse Practitioners) who all work together to provide you with the care you need, when you need it. ? ?Your next appointment:   ?Structural Team will follow up for CT scans ?Referral placed by team for Dr Cyndia Bent visit ? ?Provider:   ?[name], MD ? ? ?Other Instructions ? ?[place] ?[place] OFFICE ?[address], [address] ?[place] [address] ?Dept: [phone] ?Loc: [phone] ? ?Tony [name]  07/21/2021 ? ?You are scheduled for a Cardiac Catheterization on Monday, April 10 with Dr. [name]. ? ?1. Please arrive at the Main Entrance A at [facility]: [place], [place] [address] at 7:00 AM (This time is two hours before your procedure to ensure your preparation). Free valet parking service is available.  ? ?Special note: Every effort is made to have your procedure done on time. Please understand that  emergencies sometimes delay scheduled procedures. ? ?2. Diet: Do not eat solid foods after midnight.  You may have clear liquids until 5 AM upon the day of the procedure. ? ?3. Labs: You will need to have blood drawn on Friday, March 24 at [facility]. [address], [place]  ?Open: 7:30am - 5pm    Phone: [phone]. You do not need to be fasting. ? ?4. Medication instructions in preparation for your procedure: ? ? Contrast Allergy: No ? ?DO NOT take Invokamet day of procedure or 2 days after procedure (resume taking Thursday) ? ?DO NOT use Trulicity injection day before or day of procedure ? ?On the morning of your procedure, take Aspirin '81mg'$  and any morning medicines NOT listed above.  You may use sips of water. ? ?5. Plan to go home the same day, you will only stay overnight if medically necessary. ?6. You MUST have a responsible adult to drive you home. ?7. An adult MUST be with you the first 24 hours after you arrive home. ?8. Bring a current list of your medications, and the last time and date medication taken. ?9. Bring ID and current insurance cards. ?10.Please wear clothes that are easy to get on and off and wear slip-on shoes. ? ?Thank you for allowing [place] to care for you! ?  -- Tony [name] Invasive Cardi[facility] services   ?  ?

## 2021-07-21 NOTE — Progress Notes (Addendum)
Pre Surgical Assessment: 5 M Walk Test ? ?57M=16.65f ? ?5 Meter Walk Test- trial 1: 5.19 seconds ?5 Meter Walk Test- trial 2: 5.56 seconds ?5 Meter Walk Test- trial 3: 5.20 seconds ?5 Meter Walk Test Average: 5.31 seconds ? ?__________________ ? ?STS SCORE ? ?Procedure: Isolated AVR ?Risk of Mortality: ?1.292% ?Renal Failure: ?2.366% ?Permanent Stroke: ?0.947% ?Prolonged Ventilation: ?6.859% ?DSW Infection: ?0.124% ?Reoperation: ?3.870% ?Morbidity or Mortality: ?11.282% ?Short Length of Stay: ?39.356% ?Long Length of Stay: ?5.436% ? ?

## 2021-07-21 NOTE — H&P (View-Only) (Signed)
?Cardiology Office Note:   ? ?Date:  07/22/2021  ? ?ID:  ADRYAN [name], DOB 05-18-1950, MRN [id number] ? ?PCP:  Tony Manes, MD ?  ?[facility] Providers ?Cardiologist:  Tony Majestic, MD ?Electrophysiologist:  Tony Grayer, MD    ? ?Referring MD: Tony Pelton, NP  ? ?Chief Complaint  ?Patient presents with  ? Shortness of Breath  ? ? ?History of Present Illness:   ? ?Tony [name] is a 71 y.o. male referred for evaluation of severe aortic stenosis by Tony Sera, NP, and [name].  The patient has multiple comorbid medical conditions, including complete heart block status post permanent pacemaker in 9675, chronic systolic heart failure, type 2 diabetes, obstructive sleep apnea, and hypothyroidism. ? ?The patient is here with his daughter today. He's known of a heart murmur now for approximately 25 years. He has limitation from gait instability as well.  He is done some physical therapy that is helped with this.  Patient recently went to a basketball game at the [facility] and was short of breath walking up the ramp and became more short of breath walking up the steps to see.  He had to stop and rest.  States this was the first time he has been really limited by shortness of breath but he has noticed that this is been more problematic for him over the past few months.  He has not had any chest pain or pressure.  He does have some postural lightheadedness but has not had presyncope or frank syncope. ? ?In review of the patient's chart over time, his LVEF was normal at 60 to 65% in 2018.  In 2019 his LVEF was 55% with peak and mean transaortic gradients of 52 and 20 mmHg at that time.  In 2020 his LVEF had dropped to 50 to 55% with peak and mean gradients of 57 and 28 mmHg, respectively.  In 2021 his LVEF decreased further to 40 to 45% with peak and mean gradients of 43 and 23 mmHg, respectively.  In January 2023, his LVEF remained stable at 40 to 45% with peak and mean transaortic gradients of  46 and 25 mmHg, respectively.  The dimensionless index is 0.24 and stroke-volume index is 35. ? ?Past Medical History:  ?Diagnosis Date  ? Cancer [facility])   ? thyroid  ? Complete heart block ([place])   ? a. s/p MDT dual chamber PPM followed by Dr Tony [name]   ? Complication of anesthesia   ? 1985 after Thyriodectomy hard time waking up  ? Diabetes mellitus   ? GERD (gastroesophageal reflux disease)   ? Hypertension   ? Hypothyroidism   ? Had two surgeries for Cancer  ? ? ?Past Surgical History:  ?Procedure Laterality Date  ? LUMBAR LAMINECTOMY/DECOMPRESSION MICRODISCECTOMY  07/19/2011  ? Procedure: LUMBAR LAMINECTOMY/DECOMPRESSION MICRODISCECTOMY 3 LEVELS;  Surgeon: Tony Schools, MD;  Location: [place];  Service: Orthopedics;  Laterality: Left;  Lumbar three-Lumbar five LEFT DECOMPRESSION AND FORAMINOTOMY Lumbar three-four LEFT DISCECTOMY  ? PERMANENT PACEMAKER INSERTION N/A 08/11/2014  ? MDT Adapta L implanted by Dr Tony [name] for CHB  ? Thyroidectomy x2    ? ? ?Current Medications: ?Current Meds  ?Medication Sig  ? acetaminophen (TYLENOL) 500 MG tablet Take 500 mg by mouth every 6 (six) hours as needed (pain).   ? aspirin EC 81 MG tablet Take 81 mg by mouth daily.  ? atorvastatin (LIPITOR) 40 MG tablet Take by mouth.  ? BD PEN NEEDLE NANO 2ND GEN 32G X  4 MM MISC   ? carvedilol (COREG) 12.5 MG tablet Take 6.25 mg by mouth 2 (two) times daily.  ? carvedilol (COREG) 6.25 MG tablet TAKE 1 TABLET(6.25 MG) BY MOUTH TWICE DAILY  ? Continuous Blood Gluc Receiver (FREESTYLE LIBRE 2 READER) DEVI by Does not apply route.  ? cyclobenzaprine (FLEXERIL) 10 MG tablet Take 1 tablet by mouth 3 (three) times daily as needed.  ? famotidine (PEPCID) 20 MG tablet Take 20 mg by mouth daily.  ? gabapentin (NEURONTIN) 100 MG capsule Take 2 capsules (200 mg total) by mouth at bedtime. (Patient taking differently: Take 100 mg by mouth at bedtime.)  ? ibuprofen (ADVIL,MOTRIN) 800 MG tablet Take 800 mg by mouth every 8 (eight) hours as needed. For pain.  ?  insulin glargine (LANTUS SOLOSTAR) 100 UNIT/ML Solostar Pen 40 units in morning and 40 units at bedtime  ? INVOKANA 300 MG TABS tablet Take by mouth.  ? Lancets (ONETOUCH DELICA PLUS [id number]) MISC Apply topically.  ? levothyroxine (SYNTHROID, LEVOTHROID) 175 MCG tablet Take 175 mcg by mouth daily before breakfast.   ? Multiple Vitamins-Minerals (SENIOR MULTIVITAMIN PLUS PO) Take by mouth daily.  ? mupirocin ointment (BACTROBAN) 2 % Apply 1 application topically as needed.   ? sacubitril-valsartan (ENTRESTO) 49-51 MG Take 1 tablet by mouth 2 (two) times daily.  ? sertraline (ZOLOFT) 100 MG tablet Take 200 mg by mouth daily.   ? Tamsulosin HCl (FLOMAX) 0.4 MG CAPS Take 1 capsule (0.4 mg total) by mouth every morning.  ? TRULICITY 3 PT/4.6FK SOPN once a week.  ?  ? ?Allergies:   Corticosteroids, Codeine, Crestor [rosuvastatin calcium], Metformin hcl, and Simvastatin  ? ?Social History  ? ?Socioeconomic History  ? Marital status: Married  ?  Spouse name: Tony [name]  ? Number of children: 2  ? Years of education: college  ? Highest education level: Not on file  ?Occupational History  ? Occupation: retired  ?Tobacco Use  ? Smoking status: Never  ? Smokeless tobacco: Never  ?Vaping Use  ? Vaping Use: Never used  ?Substance and Sexual Activity  ? Alcohol use: No  ?  Alcohol/week: 0.0 standard drinks  ? Drug use: No  ? Sexual activity: Not on file  ?Other Topics Concern  ? Not on file  ?Social History Narrative  ? Not on file  ? ?Social Determinants of Health  ? ?Financial Resource Strain: Not on file  ?Food Insecurity: Not on file  ?Transportation Needs: Not on file  ?Physical Activity: Not on file  ?Stress: Not on file  ?Social Connections: Not on file  ?  ? ?Family History: ?The patient's family history includes Healthy in his daughter, sister, and son; Heart attack in his father; Heart disease in his father; Lung cancer in his mother. There is no history of Anesthesia problems, Hypotension, or Pseudochol deficiency. ? ?ROS:    ?Please see the history of present illness.    ?All other systems reviewed and are negative. ? ?EKGs/Labs/Other Studies Reviewed:   ? ?The following studies were reviewed today: ?Echo 05/04/2021: ?1. Left ventricular ejection fraction, by estimation, is 40 to 45%. Left  ?ventricular ejection fraction by 3D volume is 44 %. The left ventricle has  ?mildly decreased function. The left ventricle demonstrates global  ?hypokinesis. There is severe asymmetric  ? left ventricular hypertrophy of the basal-septal segment. Left  ?ventricular diastolic parameters are consistent with Grade I diastolic  ?dysfunction (impaired relaxation). Elevated left ventricular end-diastolic  ?pressure. The E/e' is 54.  ?  2. Right ventricular systolic function is low normal. The right  ?ventricular size is normal. There is normal pulmonary artery systolic  ?pressure. The estimated right ventricular systolic pressure is 32.4 mmHg.  ? 3. Left atrial size was mildly dilated.  ? 4. The mitral valve is abnormal. Mild to moderate mitral valve  ?regurgitation.  ? 5. Calcification and reduced mobility of the right coronary cusp. The  ?aortic valve is tricuspid. Aortic valve regurgitation is trivial. Moderate  ?aortic valve stenosis. Aortic regurgitation PHT measures 621 msec. Aortic  ?valve area, by VTI measures 1.02  ?cm?Marland Kitchen Aortic valve mean gradient measures 25.0 mmHg. Aortic valve Vmax  ?measures 3.38 m/s. DI is 0.24.  ? 6. Aortic dilatation noted. There is mild dilatation of the ascending  ?aorta, measuring 41 mm.  ? 7. The inferior vena cava is normal in size with greater than 50%  ?respiratory variability, suggesting right atrial pressure of 3 mmHg.  ? 8. Cannot exclude a small PFO.  ? 9. Rhythm strip during this exam demostrated a paced rhythm.  ? ?Comparison(s): Changes from prior study are noted. 04/14/2020: LVEF  ?40-45%, severe LVH, mild LAE, moderate AS- mean gradient 31.6 mmHg,  ?dilated aorta to 40 mm.  ? ?LEFT VENTRICLE  ?PLAX 2D  ?LVIDd:          3.30 cm         Diastology  ?LVIDs:         2.60 cm         LV e' medial:    4.68 cm/s  ?LV PW:         1.70 cm         LV E/e' medial:  13.9  ?LV IVS:        1.80 cm         LV e' lateral:

## 2021-07-26 ENCOUNTER — Encounter: Payer: Self-pay | Admitting: Physician Assistant

## 2021-08-03 ENCOUNTER — Telehealth: Payer: Self-pay | Admitting: *Deleted

## 2021-08-03 NOTE — Telephone Encounter (Addendum)
Cardiac Catheterization scheduled at [facility] for: Monday August 07, 2021 9 AM ?Arrival time and place: [place] Entrance A at: 7 AM ? ? ?No solid food after midnight prior to cath, clear liquids until 5 AM day of procedure. ? ?Medication instructions: ?-Hold: ? Insulin -AM of procedure/1/2 usual Insulin HS prior to procedure ? Invokana-AM of procedure ? Entresto-PM prior/AM of procedure-per protocol GFR 51 ? NSAIDs -patient reports he does not take NSAIDs ?-Except hold medications usual morning medications can be taken with sips of water including aspirin 81 mg. ? ?Confirmed patient has responsible adult to drive home post procedure and be with patient first 24 hours after arriving home. ? ?Patient reports no new symptoms concerning for COVID-19/no exposure to COVID-19 in the past 10 days. ? ?Reviewed procedure instructions with patients. ?

## 2021-08-07 ENCOUNTER — Encounter (HOSPITAL_COMMUNITY)
Admission: RE | Disposition: A | Discharge status: Home / Self Care | Payer: Self-pay | Source: Home / Self Care | Attending: Cardiovascular Disease

## 2021-08-07 ENCOUNTER — Other Ambulatory Visit: Payer: Self-pay

## 2021-08-07 ENCOUNTER — Ambulatory Visit (HOSPITAL_COMMUNITY)
Admission: RE | Admit: 2021-08-07 | Discharge: 2021-08-07 | Disposition: A | Discharge status: Home / Self Care | Payer: Medicare PPO | Attending: Cardiovascular Disease | Admitting: Cardiovascular Disease

## 2021-08-07 DIAGNOSIS — I442 Atrioventricular block, complete: Secondary | ICD-10-CM | POA: Insufficient documentation

## 2021-08-07 DIAGNOSIS — E039 Hypothyroidism, unspecified: Secondary | ICD-10-CM | POA: Insufficient documentation

## 2021-08-07 DIAGNOSIS — I5022 Chronic systolic (congestive) heart failure: Secondary | ICD-10-CM | POA: Diagnosis not present

## 2021-08-07 DIAGNOSIS — Z794 Long term (current) use of insulin: Secondary | ICD-10-CM | POA: Diagnosis not present

## 2021-08-07 DIAGNOSIS — I110 Hypertensive heart disease with heart failure: Secondary | ICD-10-CM | POA: Insufficient documentation

## 2021-08-07 DIAGNOSIS — E119 Type 2 diabetes mellitus without complications: Secondary | ICD-10-CM | POA: Diagnosis not present

## 2021-08-07 DIAGNOSIS — G4733 Obstructive sleep apnea (adult) (pediatric): Secondary | ICD-10-CM | POA: Insufficient documentation

## 2021-08-07 DIAGNOSIS — I350 Nonrheumatic aortic (valve) stenosis: Secondary | ICD-10-CM | POA: Diagnosis not present

## 2021-08-07 DIAGNOSIS — Z950 Presence of cardiac pacemaker: Secondary | ICD-10-CM | POA: Diagnosis not present

## 2021-08-07 HISTORY — PX: RIGHT/LEFT HEART CATH AND CORONARY ANGIOGRAPHY: CATH118266

## 2021-08-07 HISTORY — PX: RIGHT HEART CATH AND CORONARY ANGIOGRAPHY: CATH118264

## 2021-08-07 LAB — GLUCOSE, CAPILLARY
Glucose-Capillary: 154 mg/dL — ABNORMAL HIGH (ref 70–99)
Glucose-Capillary: 139 mg/dL — ABNORMAL HIGH (ref 70–99)

## 2021-08-07 LAB — POCT I-STAT EG7
Acid-Base Excess: 2 mmol/L (ref 0.0–2.0)
Bicarbonate: 28.5 mmol/L — ABNORMAL HIGH (ref 20.0–28.0)
Calcium, Ion: 1.25 mmol/L (ref 1.15–1.40)
HCT: 37 % — ABNORMAL LOW (ref 39.0–52.0)
Hemoglobin: 12.6 g/dL — ABNORMAL LOW (ref 13.0–17.0)
O2 Saturation: 62 %
Potassium: 3.8 mmol/L (ref 3.5–5.1)
Sodium: 141 mmol/L (ref 135–145)
TCO2: 30 mmol/L (ref 22–32)
pCO2, Ven: 52 mmHg (ref 44–60)
pH, Ven: 7.347 (ref 7.25–7.43)
pO2, Ven: 34 mmHg (ref 32–45)

## 2021-08-07 LAB — POCT I-STAT 7, (LYTES, BLD GAS, ICA,H+H)
Acid-Base Excess: 1 mmol/L (ref 0.0–2.0)
Bicarbonate: 26.4 mmol/L (ref 20.0–28.0)
Calcium, Ion: 1.2 mmol/L (ref 1.15–1.40)
HCT: 36 % — ABNORMAL LOW (ref 39.0–52.0)
Hemoglobin: 12.2 g/dL — ABNORMAL LOW (ref 13.0–17.0)
O2 Saturation: 92 %
Potassium: 3.6 mmol/L (ref 3.5–5.1)
Sodium: 141 mmol/L (ref 135–145)
TCO2: 28 mmol/L (ref 22–32)
pCO2 arterial: 45.1 mmHg (ref 32–48)
pH, Arterial: 7.376 (ref 7.35–7.45)
pO2, Arterial: 66 mmHg — ABNORMAL LOW (ref 83–108)

## 2021-08-07 SURGERY — RIGHT/LEFT HEART CATH AND CORONARY ANGIOGRAPHY
Anesthesia: LOCAL

## 2021-08-07 MED ORDER — MIDAZOLAM HCL 2 MG/2ML IJ SOLN
INTRAMUSCULAR | Status: DC | PRN
  Administered 2021-08-07: 2 mg via INTRAVENOUS

## 2021-08-07 MED ORDER — LABETALOL HCL 5 MG/ML IV SOLN: 10.0000 mg | INTRAVENOUS | Status: DC | PRN

## 2021-08-07 MED ORDER — FENTANYL CITRATE (PF) 100 MCG/2ML IJ SOLN
INTRAMUSCULAR | Status: DC | PRN
  Administered 2021-08-07: 25 ug via INTRAVENOUS

## 2021-08-07 MED ORDER — HEPARIN (PORCINE) IN NACL 1000-0.9 UT/500ML-% IV SOLN
INTRAVENOUS | Status: AC
Start: 2021-08-07 — End: ?
  Filled 2021-08-07: qty 1000

## 2021-08-07 MED ORDER — SODIUM CHLORIDE 0.9 % WEIGHT BASED INFUSION
3.0000 mL/kg/h | INTRAVENOUS | Status: AC
  Administered 2021-08-07: 3 mL/kg/h via INTRAVENOUS

## 2021-08-07 MED ORDER — SODIUM CHLORIDE 0.9 % WEIGHT BASED INFUSION: 1.0000 mL/kg/h | INTRAVENOUS | Status: DC

## 2021-08-07 MED ORDER — ONDANSETRON HCL 4 MG/2ML IJ SOLN: 4.0000 mg | Freq: Four times a day (QID) | INTRAMUSCULAR | Status: DC | PRN

## 2021-08-07 MED ORDER — MIDAZOLAM HCL 2 MG/2ML IJ SOLN
INTRAMUSCULAR | Status: AC
  Filled 2021-08-07: qty 2

## 2021-08-07 MED ORDER — VERAPAMIL HCL 2.5 MG/ML IV SOLN: INTRAVENOUS | Status: DC | PRN

## 2021-08-07 MED ORDER — VERAPAMIL HCL 2.5 MG/ML IV SOLN
INTRAVENOUS | Status: AC
  Filled 2021-08-07: qty 2

## 2021-08-07 MED ORDER — SODIUM CHLORIDE 0.9% FLUSH: 3.0000 mL | Freq: Two times a day (BID) | INTRAVENOUS | Status: DC

## 2021-08-07 MED ORDER — HEPARIN SODIUM (PORCINE) 1000 UNIT/ML IJ SOLN
INTRAMUSCULAR | Status: DC | PRN
  Administered 2021-08-07: 5000 [IU] via INTRAVENOUS

## 2021-08-07 MED ORDER — SODIUM CHLORIDE 0.9% FLUSH
3.0000 mL | INTRAVENOUS | Status: DC | PRN
Start: 2021-08-07 — End: 2021-08-07

## 2021-08-07 MED ORDER — SODIUM CHLORIDE 0.9 % IV SOLN: 250.0000 mL | INTRAVENOUS | Status: DC | PRN

## 2021-08-07 MED ORDER — HYDRALAZINE HCL 20 MG/ML IJ SOLN: 10.0000 mg | INTRAMUSCULAR | Status: DC | PRN

## 2021-08-07 MED ORDER — LIDOCAINE HCL (PF) 1 % IJ SOLN
INTRAMUSCULAR | Status: AC
  Filled 2021-08-07: qty 30

## 2021-08-07 MED ORDER — ASPIRIN 81 MG PO CHEW
81.0000 mg | CHEWABLE_TABLET | ORAL | Status: AC
  Administered 2021-08-07: 81 mg via ORAL
  Filled 2021-08-07: qty 1

## 2021-08-07 MED ORDER — FENTANYL CITRATE (PF) 100 MCG/2ML IJ SOLN
INTRAMUSCULAR | Status: AC
  Filled 2021-08-07: qty 2

## 2021-08-07 MED ORDER — LIDOCAINE HCL (PF) 1 % IJ SOLN
INTRAMUSCULAR | Status: DC | PRN
Start: 2021-08-07 — End: 2021-08-07
  Administered 2021-08-07 (×3): 2 mL

## 2021-08-07 MED ORDER — IOHEXOL 350 MG/ML SOLN
INTRAVENOUS | Status: DC | PRN
Start: 2021-08-07 — End: 2021-08-07
  Administered 2021-08-07: 50 mL

## 2021-08-07 MED ORDER — SODIUM CHLORIDE 0.9% FLUSH: 3.0000 mL | INTRAVENOUS | Status: DC | PRN

## 2021-08-07 MED ORDER — HEPARIN (PORCINE) IN NACL 1000-0.9 UT/500ML-% IV SOLN
INTRAVENOUS | Status: DC | PRN
  Administered 2021-08-07 (×2): 500 mL

## 2021-08-07 MED ORDER — ACETAMINOPHEN 325 MG PO TABS: 650.0000 mg | ORAL_TABLET | ORAL | Status: DC | PRN

## 2021-08-07 MED ORDER — HEPARIN SODIUM (PORCINE) 1000 UNIT/ML IJ SOLN
INTRAMUSCULAR | Status: AC
  Filled 2021-08-07: qty 10

## 2021-08-07 SURGICAL SUPPLY — 11 items
BAND ZEPHYR COMPRESS 30 LONG (HEMOSTASIS) ×1 IMPLANT
CATH 5FR JL3.5 JR4 ANG PIG MP (CATHETERS) ×1 IMPLANT
CATH BALLN WEDGE 5F 110CM (CATHETERS) ×1 IMPLANT
GLIDESHEATH SLEND SS 6F .021 (SHEATH) ×1 IMPLANT
GUIDEWIRE INQWIRE 1.5J.035X260 (WIRE) IMPLANT
INQWIRE 1.5J .035X260CM (WIRE) ×2
KIT HEART LEFT (KITS) ×2 IMPLANT
PACK CARDIAC CATHETERIZATION (CUSTOM PROCEDURE TRAY) ×2 IMPLANT
SHEATH GLIDE SLENDER 4/5FR (SHEATH) ×1 IMPLANT
TRANSDUCER W/STOPCOCK (MISCELLANEOUS) ×2 IMPLANT
TUBING CIL FLEX 10 FLL-RA (TUBING) ×2 IMPLANT

## 2021-08-07 NOTE — Interval H&P Note (Signed)
History and Physical Interval Note: ? ?08/07/2021 ?8:41 AM ? ?Tony [name]  has presented today for surgery, with the diagnosis of aortic stenosis.  The various methods of treatment have been discussed with the patient and family. After consideration of risks, benefits and other options for treatment, the patient has consented to  Procedure(s): ?RIGHT/LEFT HEART CATH AND CORONARY ANGIOGRAPHY (N/A) as a surgical intervention.  The patient's history has been reviewed, patient examined, no change in status, stable for surgery.  I have reviewed the patient's chart and labs.  Questions were answered to the patient's satisfaction.   ? ? ?[name] ? ? ?

## 2021-08-08 ENCOUNTER — Encounter (HOSPITAL_COMMUNITY): Payer: Self-pay | Admitting: Cardiovascular Disease

## 2021-08-14 ENCOUNTER — Ambulatory Visit (HOSPITAL_COMMUNITY)
Admission: RE | Admit: 2021-08-14 | Discharge: 2021-08-14 | Disposition: A | Discharge status: Home / Self Care | Payer: Medicare PPO | Source: Ambulatory Visit | Attending: Cardiovascular Disease | Admitting: Cardiovascular Disease

## 2021-08-14 DIAGNOSIS — I700 Atherosclerosis of aorta: Secondary | ICD-10-CM | POA: Diagnosis not present

## 2021-08-14 DIAGNOSIS — R911 Solitary pulmonary nodule: Secondary | ICD-10-CM | POA: Diagnosis not present

## 2021-08-14 DIAGNOSIS — R0609 Other forms of dyspnea: Secondary | ICD-10-CM | POA: Diagnosis not present

## 2021-08-14 DIAGNOSIS — I350 Nonrheumatic aortic (valve) stenosis: Secondary | ICD-10-CM | POA: Diagnosis not present

## 2021-08-14 MED ORDER — IOHEXOL 350 MG/ML SOLN
100.0000 mL | Freq: Once | INTRAVENOUS | Status: AC | PRN
  Administered 2021-08-14: 100 mL via INTRAVENOUS

## 2021-08-21 ENCOUNTER — Encounter: Payer: Self-pay | Admitting: Physician Assistant

## 2021-08-22 ENCOUNTER — Other Ambulatory Visit: Payer: Self-pay

## 2021-08-22 DIAGNOSIS — I350 Nonrheumatic aortic (valve) stenosis: Secondary | ICD-10-CM

## 2021-08-23 ENCOUNTER — Encounter: Payer: Medicare PPO | Admitting: Surgery

## 2021-08-23 DIAGNOSIS — E669 Obesity, unspecified: Secondary | ICD-10-CM | POA: Diagnosis not present

## 2021-08-23 DIAGNOSIS — E1165 Type 2 diabetes mellitus with hyperglycemia: Secondary | ICD-10-CM | POA: Diagnosis not present

## 2021-08-23 DIAGNOSIS — E039 Hypothyroidism, unspecified: Secondary | ICD-10-CM | POA: Diagnosis not present

## 2021-08-23 DIAGNOSIS — Z794 Long term (current) use of insulin: Secondary | ICD-10-CM | POA: Diagnosis not present

## 2021-08-24 ENCOUNTER — Encounter: Payer: Self-pay | Admitting: Surgery

## 2021-08-24 ENCOUNTER — Institutional Professional Consult (permissible substitution): Payer: Medicare PPO | Admitting: Surgery

## 2021-08-24 VITALS — BP 148/78 | HR 70 | Resp 20 | Ht 72.0 in | Wt 243.0 lb

## 2021-08-24 DIAGNOSIS — I350 Nonrheumatic aortic (valve) stenosis: Secondary | ICD-10-CM | POA: Diagnosis not present

## 2021-08-24 NOTE — Pre-Procedure Instructions (Signed)
Surgical Instructions ? ? ? Your procedure is scheduled on Tuesday 08/29/21. ? ? Report to [facility] Entrance "A" at 07:15 A.M., then check in with the Admitting office. ? Call this number if you have problems the morning of surgery: ? [phone] ? ? If you have any questions prior to your surgery date call [phone]: Open Monday-Friday 8am-4pm ? ? ? Remember: ? Do not eat or drink after midnight the night before your surgery ?  ?Per instructions from your surgeon's office. ? ?STOP on 08/24/2021 taking any Aleve, Naproxen, Ibuprofen, Motrin, Advil, Goody's, BC's, all herbal medications, fish oil, and all non-prescription vitamins.    Please follow instructions from pre-admission testing in regards to your diabetic medication regimen prior to surgery.   Continue taking all other medications including Aspirin without change through the day before surgery. On the morning of surgery take only Levothyroxine with a sip of water. ?  ?WHAT DO I DO ABOUT MY DIABETES MEDICATION? ? ? ?Do not take oral diabetes medicines (pills) the morning of surgery. ? ?DO NOT TAKE canagliflozin (INVOKANA) the day before surgery or the morning of surgery. ? ?THE EVENING BEFORE SURGERY, take twenty units of insulin glargine (LANTUS SOLOSTAR). This is half of your normal dose.     ? ? ?THE MORNING OF SURGERY, take twenty units of insulin glargine (LANTUS SOLOSTAR). This is half of your normal dose.  ? ?The day of surgery, do not take other diabetes injectables, including Byetta (exenatide), Bydureon (exenatide ER), Victoza (liraglutide), or Trulicity (dulaglutide). ? ?If your CBG is greater than 220 mg/dL, you may take ? of your sliding scale (correction) dose of insulin. ? ? ?HOW TO MANAGE YOUR DIABETES ?BEFORE AND AFTER SURGERY ? ?Why is it important to control my blood sugar before and after surgery? ?Improving blood sugar levels before and after surgery helps healing and can limit problems. ?A way of improving blood sugar control is  eating a healthy diet by: ? Eating less sugar and carbohydrates ? Increasing activity/exercise ? Talking with your doctor about reaching your blood sugar goals ?High blood sugars (greater than 180 mg/dL) can raise your risk of infections and slow your recovery, so you will need to focus on controlling your diabetes during the weeks before surgery. ?Make sure that the doctor who takes care of your diabetes knows about your planned surgery including the date and location. ? ?How do I manage my blood sugar before surgery? ?Check your blood sugar at least 4 times a day, starting 2 days before surgery, to make sure that the level is not too high or low. ? ?Check your blood sugar the morning of your surgery when you wake up and every 2 hours until you get to the Short Stay unit. ? ?If your blood sugar is less than 70 mg/dL, you will need to treat for low blood sugar: ?Do not take insulin. ?Treat a low blood sugar (less than 70 mg/dL) with ? cup of clear juice (cranberry or apple), 4 glucose tablets, OR glucose gel. ?Recheck blood sugar in 15 minutes after treatment (to make sure it is greater than 70 mg/dL). If your blood sugar is not greater than 70 mg/dL on recheck, call [phone] for further instructions. ?Report your blood sugar to the short stay nurse when you get to Short Stay. ? ?If you are admitted to the hospital after surgery: ?Your blood sugar will be checked by the staff and you will probably be given insulin after surgery (instead of oral  diabetes medicines) to make sure you have good blood sugar levels. ?The goal for blood sugar control after surgery is 80-180 mg/dL.  ?         ?Do not wear jewelry or makeup ?Do not wear lotions, powders, perfumes/colognes, or deodorant. ?Do not shave 48 hours prior to surgery.  Men may shave face and neck. ?Do not bring valuables to the hospital. ?Do not wear nail polish, gel polish, artificial nails, or any other type of covering on natural nails (fingers and toes) ?If  you have artificial nails or gel coating that need to be removed by a nail salon, please have this removed prior to surgery. Artificial nails or gel coating may interfere with anesthesia's ability to adequately monitor your vital signs. ? ?[facility] is not responsible for any belongings or valuables. .  ? ?Do NOT Smoke (Tobacco/Vaping)  24 hours prior to your procedure ? ?If you use a CPAP at night, you may bring your mask for your overnight stay. ?  ?Contacts, glasses, hearing aids, dentures or partials may not be worn into surgery, please bring cases for these belongings ?  ?For patients admitted to the hospital, discharge time will be determined by your treatment team. ?  ?Patients discharged the day of surgery will not be allowed to drive home, and someone needs to stay with them for 24 hours. ? ? ?SURGICAL WAITING ROOM VISITATION ?Patients having surgery or a procedure in a hospital may have two support people. ?Children under the age of 57 must have an adult with them who is not the patient. ?They may stay in the waiting area during the procedure and may switch out with other visitors. If the patient needs to stay at the hospital during part of their recovery, the visitor guidelines for inpatient rooms apply. ? ?Please refer to the [facility] website for the visitor guidelines for Inpatients (after your surgery is over and you are in a regular room).  ? ? ? ? ? ?Special instructions:   ? ?Oral Hygiene is also important to reduce your risk of infection.  Remember - BRUSH YOUR TEETH THE MORNING OF SURGERY WITH YOUR REGULAR TOOTHPASTE ? ? ?[facility]- Preparing For Surgery ? ?Before surgery, you can play an important role. Because skin is not sterile, your skin needs to be as free of germs as possible. You can reduce the number of germs on your skin by washing with CHG (chlorahexidine gluconate) Soap before surgery.  CHG is an antiseptic cleaner which kills germs and bonds with the skin to continue killing  germs even after washing.   ? ? ?Please do not use if you have an allergy to CHG or antibacterial soaps. If your skin becomes reddened/irritated stop using the CHG.  ?Do not shave (including legs and underarms) for at least 48 hours prior to first CHG shower. It is OK to shave your face. ? ?Please follow these instructions carefully. ?  ? ? Shower the NIGHT BEFORE SURGERY and the MORNING OF SURGERY with CHG Soap.  ? If you chose to wash your hair, wash your hair first as usual with your normal shampoo. After you shampoo, rinse your hair and body thoroughly to remove the shampoo.  Then [employer] and genitals (private parts) with your normal soap and rinse thoroughly to remove soap. ? ?After that Use CHG Soap as you would any other liquid soap. You can apply CHG directly to the skin and wash gently with a scrungie or a clean washcloth.  ? ?  Apply the CHG Soap to your body ONLY FROM THE NECK DOWN.  Do not use on open wounds or open sores. Avoid contact with your eyes, ears, mouth and genitals (private parts). Wash Face and genitals (private parts)  with your normal soap.  ? ?Wash thoroughly, paying special attention to the area where your surgery will be performed. ? ?Thoroughly rinse your body with warm water from the neck down. ? ?DO NOT shower/wash with your normal soap after using and rinsing off the CHG Soap. ? ?Pat yourself dry with a CLEAN TOWEL. ? ?Wear CLEAN PAJAMAS to bed the night before surgery ? ?Place CLEAN SHEETS on your bed the night before your surgery ? ?DO NOT SLEEP WITH PETS. ? ? ?Day of Surgery: ? ?Take a shower with CHG soap. ?Wear Clean/Comfortable clothing the morning of surgery ?Do not apply any deodorants/lotions.   ?Remember to brush your teeth WITH YOUR REGULAR TOOTHPASTE. ? ? ? ?If you received a COVID test during your pre-op visit, it is requested that you wear a mask when out in public, stay away from anyone that may not be feeling well, and notify your surgeon if you develop symptoms. If  you have been in contact with anyone that has tested positive in the last 10 days, please notify your surgeon. ? ?  ?Please read over the following fact sheets that you were given.  ? ?

## 2021-08-24 NOTE — Progress Notes (Signed)
Patient ID: Tony [name], male   DOB: Oct 26, 1950, 71 y.o.   MRN: [id number] ? [facility]   ?MULTIDISCIPLINARY HE[facility] VALVE CLINIC ?  ? ? ? ?   ?[address] ?      [place] [address] ?            [id number]   ?      ? ?CARDIOTHORACIC SURGERY CONSULTATION REPORT ? ?PCP is Lajean Manes, MD ?Referring Provider is [name], MD ?Primary Cardiologist is [name], MD ? ?Reason for consultation:  Severe aortic stenosis ? ?HPI: ? ?The patient is a 71 year old gentleman with history of type 2 diabetes, hypertension, hypothyroidism, OSA, complete heart block status post permanent dual-chamber pacemaker in 3734, chronic systolic heart failure, and aortic stenosis that has been followed with serial echocardiograms.  His most recent echocardiogram on 05/04/2021 showed a calcified trileaflet aortic valve with restricted leaflet mobility.  The mean gradient was 25 mmHg with a dimensionless index of 0.24.  There is trivial aortic insufficiency.  The ascending aorta is mildly dilated at 4.1 cm.  There is mild to moderate mitral valve regurgitation.  Left ventricular ejection fraction is 40 to 45% with severe asymmetric left ventricular hypertrophy of the basal-septal segment.  There is grade 1 diastolic dysfunction.  Previous echocardiogram in 03/2020 showed a mean gradient of 31.6 mmHg across the aortic valve. ? ?The patient is here today by himself.  He has a daughter that is an anesthesiologist at [employer].  He has a son who lives in [place].  He reports having exertional shortness of breath with walking longer distances or up hills.  He has been tired and has exertional fatigue.  He has occasional episodes of dizziness when he sits up from laying down.  He denies syncope.  Has had no orthopnea or PND.  He denies any chest pain or pressure.  He has had no peripheral edema. ? ?Past Medical History:  ?Diagnosis Date  ? Cancer [facility])   ? thyroid  ? Complete heart  block ([place])   ? a. s/p MDT dual chamber PPM followed by Dr [name]   ? Complication of anesthesia   ? 1985 after Thyriodectomy hard time waking up  ? Diabetes mellitus   ? GERD (gastroesophageal reflux disease)   ? Hypertension   ? Hypothyroidism   ? Had two surgeries for Cancer  ? Severe aortic stenosis   ? ? ?Past Surgical History:  ?Procedure Laterality Date  ? LUMBAR LAMINECTOMY/DECOMPRESSION MICRODISCECTOMY  07/19/2011  ? Procedure: LUMBAR LAMINECTOMY/DECOMPRESSION MICRODISCECTOMY 3 LEVELS;  Surgeon: Melina Schools, MD;  Location: [facility];  Service: Orthopedics;  Laterality: Left;  Lumbar three-Lumbar five LEFT DECOMPRESSION AND FORAMINOTOMY Lumbar three-four LEFT DISCECTOMY  ? PERMANENT PACEMAKER INSERTION N/A 08/11/2014  ? MDT Adapta L implanted by Dr [name] for CHB  ? RIGHT HEART CATH AND CORONARY ANGIOGRAPHY N/A 08/07/2021  ? Procedure: RIGHT HEART CATH AND CORONARY ANGIOGRAPHY;  Surgeon: [name], MD;  Location: [facility];  Service: Cardiovascular;  Laterality: N/A;  ? RIGHT/LEFT HEART CATH AND CORONARY ANGIOGRAPHY N/A 08/07/2021  ? Procedure: RIGHT/LEFT HEART CATH AND CORONARY ANGIOGRAPHY;  Surgeon: [name], MD;  Location: [facility];  Service: Cardiovascular;  Laterality: N/A;  ? Thyroidectomy x2    ? ? ?Family History  ?Problem Relation Age of Onset  ? Lung cancer Mother   ? Heart disease Father   ? Heart attack Father   ? Healthy Sister   ?  Healthy Daughter   ? Healthy Son   ? Anesthesia problems Neg Hx   ? Hypotension Neg Hx   ? Pseudochol deficiency Neg Hx   ? ? ?Social History  ? ?Socioeconomic History  ? Marital status: Married  ?  Spouse name: jody  ? Number of children: 2  ? Years of education: college  ? Highest education level: Not on file  ?Occupational History  ? Occupation: retired  ?Tobacco Use  ? Smoking status: Never  ? Smokeless tobacco: Never  ?Vaping Use  ? Vaping Use: Never used  ?Substance and Sexual Activity  ? Alcohol use: No  ?  Alcohol/week: 0.0 standard  drinks  ? Drug use: No  ? Sexual activity: Not on file  ?Other Topics Concern  ? Not on file  ?Social History Narrative  ? Not on file  ? ?Social Determinants of Health  ? ?Financial Resource Strain: Not on file  ?Food Insecurity: Not on file  ?Transportation Needs: Not on file  ?Physical Activity: Not on file  ?Stress: Not on file  ?Social Connections: Not on file  ?Intimate Partner Violence: Not on file  ? ? ?Prior to Admission medications   ?Medication Sig Start Date End Date Taking? Authorizing Provider  ?acetaminophen (TYLENOL) 500 MG tablet Take 1,000 mg by mouth every 6 (six) hours as needed for moderate pain (pain).   Yes [provider]  ?aspirin EC 81 MG tablet Take 81 mg by mouth daily.   Yes [provider]  ?atorvastatin (LIPITOR) 40 MG tablet Take 40 mg by mouth daily. 07/06/21  Yes [provider]  ?BD PEN NEEDLE NANO 2ND GEN 32G X 4 MM MISC  01/26/21  Yes [provider]  ?canagliflozin (INVOKANA) 300 MG TABS tablet Take 300 mg by mouth daily before breakfast. 07/07/21  Yes [provider]  ?carvedilol (COREG) 6.25 MG tablet TAKE 1 TABLET(6.25 MG) BY MOUTH TWICE DAILY 07/08/20  Yes Shirley Friar, PA-C  ?Continuous Blood Gluc Receiver (FREESTYLE LIBRE 2 READER) DEVI by Does not apply route.   Yes [provider]  ?cyclobenzaprine (FLEXERIL) 10 MG tablet Take 10 mg by mouth 3 (three) times daily as needed for muscle spasms.   Yes [provider]  ?Dulaglutide 3 MG/0.5ML SOPN Inject 3 mg into the skin every Friday.   Yes [provider]  ?famotidine (PEPCID) 20 MG tablet Take 20 mg by mouth daily.   Yes [provider]  ?gabapentin (NEURONTIN) 100 MG capsule Take 2 capsules (200 mg total) by mouth at bedtime. ?Patient taking differently: Take 100 mg by mouth at bedtime. 02/24/19  Yes Deveshwar, Abel Presto, MD  ?insulin glargine (LANTUS SOLOSTAR) 100 UNIT/ML Solostar Pen Inject 40 Units into the skin 2 (two) times daily.  01/26/21  Yes [provider]  ?Lancets (ONETOUCH DELICA PLUS [id number]) [place] Apply topically. 02/20/21  Yes [provider]  ?levothyroxine (SYNTHROID, LEVOTHROID) 175 MCG tablet Take 175 mcg by mouth daily before breakfast.    Yes [provider]  ?OVER THE COUNTER MEDICATION Take 2 capsules by mouth at bedtime. [place] Flex   Yes [provider]  ?sacubitril-valsartan (ENTRESTO) 49-51 MG Take 1 tablet by mouth 2 (two) times daily. 09/02/20  Yes Allred, Jeneen Rinks, MD  ?sertraline (ZOLOFT) 100 MG tablet Take 200 mg by mouth daily.    Yes [provider]  ?Tamsulosin HCl (FLOMAX) 0.4 MG CAPS Take 1 capsule (0.4 mg total) by mouth every morning. 07/21/11  Yes Carron Curie, PA-C  ?TRULICITY  1.5 MG/0.5ML SOPN Inject 1.5 mg into the skin every Friday. 08/18/21  Yes [provider]  ? ? ?Current Outpatient Medications  ?Medication Sig Dispense Refill  ? acetaminophen (TYLENOL) 500 MG tablet Take 1,000 mg by mouth every 6 (six) hours as needed for moderate pain (pain).    ? aspirin EC 81 MG tablet Take 81 mg by mouth daily.    ? atorvastatin (LIPITOR) 40 MG tablet Take 40 mg by mouth daily.    ? BD PEN NEEDLE NANO 2ND GEN 32G X 4 MM MISC     ? canagliflozin (INVOKANA) 300 MG TABS tablet Take 300 mg by mouth daily before breakfast.    ? carvedilol (COREG) 6.25 MG tablet TAKE 1 TABLET(6.25 MG) BY MOUTH TWICE DAILY 180 tablet 2  ? Continuous Blood Gluc Receiver (FREESTYLE LIBRE 2 READER) DEVI by Does not apply route.    ? cyclobenzaprine (FLEXERIL) 10 MG tablet Take 10 mg by mouth 3 (three) times daily as needed for muscle spasms.    ? Dulaglutide 3 MG/0.5ML SOPN Inject 3 mg into the skin every Friday.    ? famotidine (PEPCID) 20 MG tablet Take 20 mg by mouth daily.    ? gabapentin (NEURONTIN) 100 MG capsule Take 2 capsules (200 mg total) by mouth at bedtime. (Patient taking differently: Take 100 mg by mouth at bedtime.) 60 capsule 2  ? insulin glargine (LANTUS SOLOSTAR) 100  UNIT/ML Solostar Pen Inject 40 Units into the skin 2 (two) times daily.    ? Lancets (ONETOUCH DELICA PLUS [id number]) MISC Apply topically.    ? levothyroxine (SYNTHROID, LEVOTHROID) 175 MCG tablet Take 175 mcg b

## 2021-08-25 ENCOUNTER — Ambulatory Visit (INDEPENDENT_AMBULATORY_CARE_PROVIDER_SITE_OTHER): Payer: Medicare PPO

## 2021-08-25 ENCOUNTER — Ambulatory Visit (HOSPITAL_COMMUNITY)
Admission: RE | Admit: 2021-08-25 | Discharge: 2021-08-25 | Disposition: A | Discharge status: Home / Self Care | Payer: Medicare PPO | Source: Ambulatory Visit | Attending: Cardiovascular Disease | Admitting: Cardiovascular Disease

## 2021-08-25 ENCOUNTER — Other Ambulatory Visit: Payer: Self-pay

## 2021-08-25 ENCOUNTER — Encounter (HOSPITAL_COMMUNITY): Payer: Self-pay

## 2021-08-25 ENCOUNTER — Encounter (HOSPITAL_COMMUNITY)
Admission: RE | Admit: 2021-08-25 | Discharge: 2021-08-25 | Disposition: A | Discharge status: Home / Self Care | Payer: Medicare PPO | Source: Ambulatory Visit | Attending: Cardiovascular Disease | Admitting: Cardiovascular Disease

## 2021-08-25 ENCOUNTER — Encounter: Payer: Self-pay | Admitting: Internal Medicine

## 2021-08-25 ENCOUNTER — Telehealth: Payer: Self-pay

## 2021-08-25 VITALS — BP 153/99 | HR 72 | Temp 98.4°F | Resp 18 | Ht 72.0 in | Wt 247.2 lb

## 2021-08-25 DIAGNOSIS — E119 Type 2 diabetes mellitus without complications: Secondary | ICD-10-CM | POA: Diagnosis not present

## 2021-08-25 DIAGNOSIS — I350 Nonrheumatic aortic (valve) stenosis: Secondary | ICD-10-CM

## 2021-08-25 DIAGNOSIS — Z01818 Encounter for other preprocedural examination: Secondary | ICD-10-CM

## 2021-08-25 DIAGNOSIS — Z20822 Contact with and (suspected) exposure to covid-19: Secondary | ICD-10-CM | POA: Diagnosis not present

## 2021-08-25 DIAGNOSIS — I442 Atrioventricular block, complete: Secondary | ICD-10-CM | POA: Diagnosis not present

## 2021-08-25 HISTORY — DX: Sleep apnea, unspecified: G47.30

## 2021-08-25 HISTORY — DX: Presence of cardiac pacemaker: Z95.0

## 2021-08-25 HISTORY — DX: Heart failure, unspecified: I50.9

## 2021-08-25 LAB — COMPREHENSIVE METABOLIC PANEL
ALT: 16 U/L (ref 0–44)
AST: 18 U/L (ref 15–41)
Albumin: 3.9 g/dL (ref 3.5–5.0)
Alkaline Phosphatase: 92 U/L (ref 38–126)
Anion gap: 4 — ABNORMAL LOW (ref 5–15)
BUN: 11 mg/dL (ref 8–23)
CO2: 26 mmol/L (ref 22–32)
Calcium: 8.7 mg/dL — ABNORMAL LOW (ref 8.9–10.3)
Chloride: 109 mmol/L (ref 98–111)
Creatinine, Ser: 1.21 mg/dL (ref 0.61–1.24)
GFR, Estimated: >60 mL/min (ref >60)
Glucose, Bld: 170 mg/dL — ABNORMAL HIGH (ref 70–99)
Potassium: 3.7 mmol/L (ref 3.5–5.1)
Sodium: 139 mmol/L (ref 135–145)
Total Bilirubin: 0.9 mg/dL (ref 0.3–1.2)
Total Protein: 7.3 g/dL (ref 6.5–8.1)

## 2021-08-25 LAB — URINALYSIS, ROUTINE W REFLEX MICROSCOPIC
Bacteria, UA: NONE SEEN
Bilirubin Urine: NEGATIVE
Glucose, UA: >=500 mg/dL — AB
Ketones, ur: NEGATIVE mg/dL
Leukocytes,Ua: NEGATIVE
Nitrite: NEGATIVE
Protein, ur: 100 mg/dL — AB
Specific Gravity, Urine: 1.024 (ref 1.005–1.030)
pH: 6 (ref 5.0–8.0)

## 2021-08-25 LAB — HEMOGLOBIN A1C
Hgb A1c MFr Bld: 7.5 % — ABNORMAL HIGH (ref 4.8–5.6)
Mean Plasma Glucose: 168.55 mg/dL

## 2021-08-25 LAB — CUP PACEART INCLINIC DEVICE CHECK
Battery Impedance: 824 Ohm
Battery Remaining Longevity: 65 mo
Battery Voltage: 2.77 V
Brady Statistic AP VP Percent: 22 %
Brady Statistic AP VS Percent: 0 %
Brady Statistic AS VP Percent: 77 %
Brady Statistic AS VS Percent: 1 %
Date Time Interrogation Session: 20230428102832
Implantable Lead Implant Date: 20160413
Implantable Lead Implant Date: 20160413
Implantable Lead Location: 753859
Implantable Lead Location: 753860
Implantable Lead Model: 5076
Implantable Lead Model: 5076
Implantable Pulse Generator Implant Date: 20160413
Lead Channel Impedance Value: 461 Ohm
Lead Channel Impedance Value: 537 Ohm
Lead Channel Pacing Threshold Amplitude: 0.5 V
Lead Channel Pacing Threshold Amplitude: 0.5 V
Lead Channel Pacing Threshold Amplitude: 0.625 V
Lead Channel Pacing Threshold Amplitude: 0.75 V
Lead Channel Pacing Threshold Pulse Width: 0.4 ms
Lead Channel Pacing Threshold Pulse Width: 0.4 ms
Lead Channel Pacing Threshold Pulse Width: 0.4 ms
Lead Channel Pacing Threshold Pulse Width: 0.4 ms
Lead Channel Sensing Intrinsic Amplitude: 11.2 mV
Lead Channel Sensing Intrinsic Amplitude: 2.8 mV
Lead Channel Setting Pacing Amplitude: 2 V
Lead Channel Setting Pacing Amplitude: 2.5 V
Lead Channel Setting Pacing Pulse Width: 0.4 ms
Lead Channel Setting Sensing Sensitivity: 4 mV

## 2021-08-25 LAB — GLUCOSE, CAPILLARY: Glucose-Capillary: 164 mg/dL — ABNORMAL HIGH (ref 70–99)

## 2021-08-25 LAB — SURGICAL PCR SCREEN
MRSA, PCR: NEGATIVE
Staphylococcus aureus: POSITIVE — AB

## 2021-08-25 LAB — TYPE AND SCREEN
ABO/RH(D): O POS
Antibody Screen: NEGATIVE

## 2021-08-25 LAB — CBC
HCT: 44.3 % (ref 39.0–52.0)
Hemoglobin: 14.9 g/dL (ref 13.0–17.0)
MCH: 30.7 pg (ref 26.0–34.0)
MCHC: 33.6 g/dL (ref 30.0–36.0)
MCV: 91.3 fL (ref 80.0–100.0)
Platelets: 185 10*3/uL (ref 150–400)
RBC: 4.85 MIL/uL (ref 4.22–5.81)
RDW: 14.3 % (ref 11.5–15.5)
WBC: 9.7 10*3/uL (ref 4.0–10.5)
nRBC: 0 % (ref 0.0–0.2)

## 2021-08-25 LAB — PROTIME-INR
INR: 0.9 (ref 0.8–1.2)
Prothrombin Time: 12.4 seconds (ref 11.4–15.2)

## 2021-08-25 LAB — SARS CORONAVIRUS 2 (TAT 6-24 HRS): SARS Coronavirus 2: NEGATIVE

## 2021-08-25 NOTE — Progress Notes (Signed)
Pacemaker check in clinic prior to scheduled TAVR on 08/29/21. Normal device function. Thresholds, sensing, impedances consistent with previous measurements. Device programmed to maximize longevity. 134 AMS <1% AT/AF burden  ?Longest episode 67mn in duration EGM illustrates PACs. 1 VHR from 2021 previously noted. Device programmed at appropriate safety margins. Histogram distribution appropriate for patient activity level. Device programmed to optimize intrinsic conduction. Estimated longevity 5.5 years. Patient given information to [facility] technical support to help with home monitor ?

## 2021-08-25 NOTE — Progress Notes (Addendum)
PCP - Dr. Lajean Manes ?Cardiologist - Dr. [name] ? ?PPM/ICD - Dual Chamber Medtronic PPM ? ?Device Orders - Message from [employer] at [employer]. Patient has not been seen in clinic or had remote since 04/2020 and no remote transmission since 05/2020. Unable to pre-op patient secondary to office policy. Sending to scheduling to see if able to get appointment with App prior to scheduled surgery 08/29/21. If unable to schedule patient will need industry to check device prior to TAVR. Patient has a dual chamber Medtronic PPM. ?Theodosia Quay, RN with Dr. Burt Knack made aware.  ? ?Rep Notified - [facility] made aware via message of date, time, procedure and location.  ? ?Chest x-ray - 08/25/21- Within 2 weeks of surgery ?EKG - 08/07/21- Within 1 month of surgery ?Stress Test - 09/2019 ?ECHO - 05/04/21 ?Cardiac Cath - 08/07/21 ? ?Sleep Study - +OSA. Wears CPAP nightly. Does not know pressure settings.  ? ?Fasting Blood Sugar - 164 ?Range 130-170 ?Has Freestyle Libre 2 continuous glucose monitor ? ?Per instructions from the office:  ? ?STOP on 08/24/2021 taking any Aleve, Naproxen, Ibuprofen, Motrin, Advil, Goody's, BC's, all herbal medications, fish oil, and all non-prescription vitamins.    Please follow instructions from pre-admission testing in regards to your diabetic medication regimen prior to surgery.   Continue taking all other medications including Aspirin without change through the day before surgery. On the morning of surgery take only Levothyroxine with a sip of water ? ?ERAS Protcol - No. NPO after midnight ? ?COVID TEST- 08/25/21. Pending.  ? ? ?Anesthesia review: Yes. Theodosia Quay, RN with Dr. Burt Knack was able to get patient an appointment to have his pacemaker checked. Patient made aware to go directly to the office on [facility] after leaving today's PAT appointment. Patient verbalized understanding.  ? ?Patient denies shortness of breath, fever, cough and chest pain at PAT appointment ? ? ?All  instructions explained to the patient, with a verbal understanding of the material. Patient agrees to go over the instructions while at home for a better understanding.The opportunity to ask questions was provided. ? ? ?

## 2021-08-25 NOTE — Progress Notes (Signed)
Device orders requested. [facility] with [employer] messaged about upcoming procedure with details including date, time, location. ?

## 2021-08-25 NOTE — Telephone Encounter (Signed)
I scheduled the patient to come in to get his device checked. ?

## 2021-08-25 NOTE — Progress Notes (Signed)
PERIOPERATIVE PRESCRIPTION FOR IMPLANTED CARDIAC DEVICE PROGRAMMING ? ?Patient Information: ?Name:  Tony [name]  ?DOB:  1951/03/18  ?MRN:  [id number]  ?Planned Procedure:  Transcatheter Aortic Valve Repalcement  ?Surgeon:  Dr. [name]  ?Date of Procedure:  08/29/21  ?Cautery will be used.  ?Position during surgery:  Supine  ? ?Please send documentation back to:  ?[facility] (Fax # [phone])  ?Device Information: ? ?Clinic EP Physician:  [name], MD  ? ?Device Type:  Pacemaker ?Manufacturer and Phone #:  Medtronic: [phone] ?Pacemaker Dependent?:  Yes.   ?Date of Last Device Check:  08/25/2021 Normal Device Function?:  Yes.   ? ?Electrophysiologist's Recommendations: ? ?Have magnet available. ?Provide continuous ECG monitoring when magnet is used or reprogramming is to be performed.  ?Procedure will likely interfere with device function.  Device should be programmed:  Asynchronous pacing during procedure and returned to normal programming after procedure ? ?Per Device Clinic Standing Orders, ?Damian Leavell, RN  ?12:05 PM 08/25/2021  ?

## 2021-08-25 NOTE — Progress Notes (Signed)
[facility] with [employer] confirmed message was received.  ?

## 2021-08-28 MED ORDER — NOREPINEPHRINE 4 MG/250ML-% IV SOLN
0.0000 ug/min | INTRAVENOUS | Status: DC
  Filled 2021-08-28: qty 250

## 2021-08-28 MED ORDER — MAGNESIUM SULFATE 50 % IJ SOLN
40.0000 meq | INTRAMUSCULAR | Status: DC
  Filled 2021-08-28: qty 9.85

## 2021-08-28 MED ORDER — POTASSIUM CHLORIDE 2 MEQ/ML IV SOLN
80.0000 meq | INTRAVENOUS | Status: DC
  Filled 2021-08-28: qty 40

## 2021-08-28 MED ORDER — CEFAZOLIN SODIUM-DEXTROSE 2-4 GM/100ML-% IV SOLN
2.0000 g | INTRAVENOUS | Status: AC
  Administered 2021-08-29: 2 g via INTRAVENOUS
  Filled 2021-08-28 (×2): qty 100

## 2021-08-28 MED ORDER — HEPARIN 30,000 UNITS/1000 ML (OHS) CELLSAVER SOLUTION
Status: DC
  Filled 2021-08-28: qty 1000

## 2021-08-28 MED ORDER — DEXMEDETOMIDINE HCL IN NACL 400 MCG/100ML IV SOLN
0.1000 ug/kg/h | INTRAVENOUS | Status: AC
  Administered 2021-08-29: 1 ug/kg/h via INTRAVENOUS
  Filled 2021-08-28 (×2): qty 100

## 2021-08-28 NOTE — H&P (Signed)
? ?   ?[address] ?      [place] [address] ?            [phone]   ? ?  ?Cardiothoracic Surgery Admission History and Physical ? ? ?PCP is Lajean Manes, MD ?Referring Provider is [name], MD ?Primary Cardiologist is [name], MD ?  ?Reason for admission:  Severe aortic stenosis ?  ?HPI: ?  ?The patient is a 71 year old gentleman with history of type 2 diabetes, hypertension, hypothyroidism, OSA, complete heart block status post permanent dual-chamber pacemaker in 3557, chronic systolic heart failure, and aortic stenosis that has been followed with serial echocardiograms.  His most recent echocardiogram on 05/04/2021 showed a calcified trileaflet aortic valve with restricted leaflet mobility.  The mean gradient was 25 mmHg with a dimensionless index of 0.24.  There is trivial aortic insufficiency.  The ascending aorta is mildly dilated at 4.1 cm.  There is mild to moderate mitral valve regurgitation.  Left ventricular ejection fraction is 40 to 45% with severe asymmetric left ventricular hypertrophy of the basal-septal segment.  There is grade 1 diastolic dysfunction.  Previous echocardiogram in 03/2020 showed a mean gradient of 31.6 mmHg across the aortic valve. ?  ?He has a daughter that is an anesthesiologist at [employer]. He has a son who lives in [place].  He reports having exertional shortness of breath with walking longer distances or up hills.  He has been tired and has exertional fatigue.  He has occasional episodes of dizziness when he sits up from laying down.  He denies syncope.  Has had no orthopnea or PND.  He denies any chest pain or pressure.  He has had no peripheral edema. ?  ?    ?Past Medical History:  ?Diagnosis Date  ? Cancer [facility])    ?  thyroid  ? Complete heart block ([place])    ?  a. s/p MDT dual chamber PPM followed by Dr [name]   ? Complication of anesthesia    ?  1985 after Thyriodectomy hard time waking up  ? Diabetes mellitus    ?  GERD (gastroesophageal reflux disease)    ? Hypertension    ? Hypothyroidism    ?  Had two surgeries for Cancer  ? Severe aortic stenosis    ?  ?  ?     ?Past Surgical History:  ?Procedure Laterality Date  ? LUMBAR LAMINECTOMY/DECOMPRESSION MICRODISCECTOMY   07/19/2011  ?  Procedure: LUMBAR LAMINECTOMY/DECOMPRESSION MICRODISCECTOMY 3 LEVELS;  Surgeon: Melina Schools, MD;  Location: [place];  Service: Orthopedics;  Laterality: Left;  Lumbar three-Lumbar five LEFT DECOMPRESSION AND FORAMINOTOMY Lumbar three-four LEFT DISCECTOMY  ? PERMANENT PACEMAKER INSERTION N/A 08/11/2014  ?  MDT Adapta L implanted by Dr [name] for CHB  ? RIGHT HEART CATH AND CORONARY ANGIOGRAPHY N/A 08/07/2021  ?  Procedure: RIGHT HEART CATH AND CORONARY ANGIOGRAPHY;  Surgeon: [name], MD;  Location: [place] [facility];  Service: Cardiovascular;  Laterality: N/A;  ? RIGHT/LEFT HEART CATH AND CORONARY ANGIOGRAPHY N/A 08/07/2021  ?  Procedure: RIGHT/LEFT HEART CATH AND CORONARY ANGIOGRAPHY;  Surgeon: [name], MD;  Location: [place] [facility];  Service: Cardiovascular;  Laterality: N/A;  ? Thyroidectomy x2      ?  ?  ?     ?Family History  ?Problem Relation Age of Onset  ? Lung cancer Mother    ? Heart disease Father    ? Heart attack Father    ?  Healthy Sister    ? Healthy Daughter    ? Healthy Son    ? Anesthesia problems Neg Hx    ? Hypotension Neg Hx    ? Pseudochol deficiency Neg Hx    ?  ?  ?Social History  ?  ?     ?Socioeconomic History  ? Marital status: Married  ?    Spouse name: jody  ? Number of children: 2  ? Years of education: college  ? Highest education level: Not on file  ?Occupational History  ? Occupation: retired  ?Tobacco Use  ? Smoking status: Never  ? Smokeless tobacco: Never  ?Vaping Use  ? Vaping Use: Never used  ?Substance and Sexual Activity  ? Alcohol use: No  ?    Alcohol/week: 0.0 standard drinks  ? Drug use: No  ? Sexual activity: Not on file  ?Other Topics Concern  ? Not on file  ?Social History Narrative  ?  Not on file  ?  ?Social Determinants of Health  ?  ?Financial Resource Strain: Not on file  ?Food Insecurity: Not on file  ?Transportation Needs: Not on file  ?Physical Activity: Not on file  ?Stress: Not on file  ?Social Connections: Not on file  ?Intimate Partner Violence: Not on file  ?  ?  ?       ?Prior to Admission medications   ?Medication Sig Start Date End Date Taking? Authorizing Provider  ?acetaminophen (TYLENOL) 500 MG tablet Take 1,000 mg by mouth every 6 (six) hours as needed for moderate pain (pain).     Yes [provider]  ?aspirin EC 81 MG tablet Take 81 mg by mouth daily.     Yes [provider]  ?atorvastatin (LIPITOR) 40 MG tablet Take 40 mg by mouth daily. 07/06/21   Yes [provider]  ?BD PEN NEEDLE NANO 2ND GEN 32G X 4 MM MISC   01/26/21   Yes [provider]  ?canagliflozin (INVOKANA) 300 MG TABS tablet Take 300 mg by mouth daily before breakfast. 07/07/21   Yes [provider]  ?carvedilol (COREG) 6.25 MG tablet TAKE 1 TABLET(6.25 MG) BY MOUTH TWICE DAILY 07/08/20   Yes Shirley Friar, PA-C  ?Continuous Blood Gluc Receiver (FREESTYLE LIBRE 2 READER) DEVI by Does not apply route.     Yes [provider]  ?cyclobenzaprine (FLEXERIL) 10 MG tablet Take 10 mg by mouth 3 (three) times daily as needed for muscle spasms.     Yes [provider]  ?Dulaglutide 3 MG/0.5ML SOPN Inject 3 mg into the skin every Friday.     Yes [provider]  ?famotidine (PEPCID) 20 MG tablet Take 20 mg by mouth daily.     Yes [provider]  ?gabapentin (NEURONTIN) 100 MG capsule Take 2 capsules (200 mg total) by mouth at bedtime. ?Patient taking differently: Take 100 mg by mouth at bedtime. 02/24/19   Yes Deveshwar, Abel Presto, MD  ?insulin glargine (LANTUS SOLOSTAR) 100 UNIT/ML Solostar Pen Inject 40 Units into the skin 2 (two) times daily. 01/26/21   Yes [provider]  ?Lancets (ONETOUCH DELICA PLUS [id number]) [place] Apply  topically. 02/20/21   Yes [provider]  ?levothyroxine (SYNTHROID, LEVOTHROID) 175 MCG tablet Take 175 mcg by mouth daily before breakfast.      Yes [provider]  ?OVER THE COUNTER MEDICATION Take 2 capsules by mouth at bedtime. Metabo Flex     Yes [provider]  ?sacubitril-valsartan (ENTRESTO) 49-51 MG Take  1 tablet by mouth 2 (two) times daily. 09/02/20   Yes Allred, Jeneen Rinks, MD  ?sertraline (ZOLOFT) 100 MG tablet Take 200 mg by mouth daily.      Yes [provider]  ?Tamsulosin HCl (FLOMAX) 0.4 MG CAPS Take 1 capsule (0.4 mg total) by mouth every morning. 07/21/11   Yes Carron Curie, PA-C  ?TRULICITY 1.5 GN/5.6OZ SOPN Inject 1.5 mg into the skin every Friday. 08/18/21   Yes [provider]  ?  ?  ?      ?Current Outpatient Medications  ?Medication Sig Dispense Refill  ? acetaminophen (TYLENOL) 500 MG tablet Take 1,000 mg by mouth every 6 (six) hours as needed for moderate pain (pain).      ? aspirin EC 81 MG tablet Take 81 mg by mouth daily.      ? atorvastatin (LIPITOR) 40 MG tablet Take 40 mg by mouth daily.      ? BD PEN NEEDLE NANO 2ND GEN 32G X 4 MM MISC        ? canagliflozin (INVOKANA) 300 MG TABS tablet Take 300 mg by mouth daily before breakfast.      ? carvedilol (COREG) 6.25 MG tablet TAKE 1 TABLET(6.25 MG) BY MOUTH TWICE DAILY 180 tablet 2  ? Continuous Blood Gluc Receiver (FREESTYLE LIBRE 2 READER) DEVI by Does not apply route.      ? cyclobenzaprine (FLEXERIL) 10 MG tablet Take 10 mg by mouth 3 (three) times daily as needed for muscle spasms.      ? Dulaglutide 3 MG/0.5ML SOPN Inject 3 mg into the skin every Friday.      ? famotidine (PEPCID) 20 MG tablet Take 20 mg by mouth daily.      ? gabapentin (NEURONTIN) 100 MG capsule Take 2 capsules (200 mg total) by mouth at bedtime. (Patient taking differently: Take 100 mg by mouth at bedtime.) 60 capsule 2  ? insulin glargine (LANTUS SOLOSTAR) 100 UNIT/ML Solostar Pen Inject 40 Units into the skin 2 (two)  times daily.      ? Lancets (ONETOUCH DELICA PLUS [id number]) MISC Apply topically.      ? levothyroxine (SYNTHROID, LEVOTHROID) 175 MCG tablet Take 175 mcg by mouth daily before breakfast.       ? OVER T

## 2021-08-29 ENCOUNTER — Other Ambulatory Visit (HOSPITAL_COMMUNITY): Payer: Medicare PPO

## 2021-08-29 ENCOUNTER — Encounter (HOSPITAL_COMMUNITY): Payer: Self-pay | Admitting: Cardiovascular Disease

## 2021-08-29 ENCOUNTER — Encounter (HOSPITAL_COMMUNITY)
Admission: RE | Disposition: A | Discharge status: Home / Self Care | Payer: Self-pay | Source: Home / Self Care | Attending: Surgery

## 2021-08-29 ENCOUNTER — Inpatient Hospital Stay (HOSPITAL_COMMUNITY): Payer: Medicare PPO | Admitting: Physician Assistant

## 2021-08-29 ENCOUNTER — Ambulatory Visit: Payer: Medicare PPO | Admitting: Cardiovascular Disease

## 2021-08-29 ENCOUNTER — Inpatient Hospital Stay (HOSPITAL_COMMUNITY): Payer: Medicare PPO

## 2021-08-29 ENCOUNTER — Inpatient Hospital Stay (HOSPITAL_COMMUNITY)
Admission: RE | Admit: 2021-08-29 | Discharge: 2021-08-30 | DRG: 267 | Disposition: A | Discharge status: Home / Self Care | Payer: Medicare PPO | Attending: Surgery | Admitting: Surgery

## 2021-08-29 ENCOUNTER — Other Ambulatory Visit: Payer: Self-pay | Admitting: Physician Assistant

## 2021-08-29 DIAGNOSIS — Z801 Family history of malignant neoplasm of trachea, bronchus and lung: Secondary | ICD-10-CM | POA: Diagnosis not present

## 2021-08-29 DIAGNOSIS — I10 Essential (primary) hypertension: Secondary | ICD-10-CM | POA: Diagnosis present

## 2021-08-29 DIAGNOSIS — E039 Hypothyroidism, unspecified: Secondary | ICD-10-CM | POA: Diagnosis present

## 2021-08-29 DIAGNOSIS — Z952 Presence of prosthetic heart valve: Secondary | ICD-10-CM | POA: Diagnosis not present

## 2021-08-29 DIAGNOSIS — Z950 Presence of cardiac pacemaker: Secondary | ICD-10-CM | POA: Diagnosis not present

## 2021-08-29 DIAGNOSIS — N1831 Chronic kidney disease, stage 3a: Secondary | ICD-10-CM | POA: Diagnosis not present

## 2021-08-29 DIAGNOSIS — I350 Nonrheumatic aortic (valve) stenosis: Secondary | ICD-10-CM | POA: Diagnosis not present

## 2021-08-29 DIAGNOSIS — K219 Gastro-esophageal reflux disease without esophagitis: Secondary | ICD-10-CM | POA: Diagnosis present

## 2021-08-29 DIAGNOSIS — Z794 Long term (current) use of insulin: Secondary | ICD-10-CM

## 2021-08-29 DIAGNOSIS — I110 Hypertensive heart disease with heart failure: Secondary | ICD-10-CM | POA: Diagnosis not present

## 2021-08-29 DIAGNOSIS — G4733 Obstructive sleep apnea (adult) (pediatric): Secondary | ICD-10-CM

## 2021-08-29 DIAGNOSIS — E1122 Type 2 diabetes mellitus with diabetic chronic kidney disease: Secondary | ICD-10-CM | POA: Diagnosis present

## 2021-08-29 DIAGNOSIS — Z79890 Hormone replacement therapy (postmenopausal): Secondary | ICD-10-CM | POA: Diagnosis not present

## 2021-08-29 DIAGNOSIS — E1142 Type 2 diabetes mellitus with diabetic polyneuropathy: Secondary | ICD-10-CM | POA: Diagnosis not present

## 2021-08-29 DIAGNOSIS — Z8249 Family history of ischemic heart disease and other diseases of the circulatory system: Secondary | ICD-10-CM

## 2021-08-29 DIAGNOSIS — Z7982 Long term (current) use of aspirin: Secondary | ICD-10-CM | POA: Diagnosis not present

## 2021-08-29 DIAGNOSIS — E890 Postprocedural hypothyroidism: Secondary | ICD-10-CM | POA: Diagnosis present

## 2021-08-29 DIAGNOSIS — I130 Hypertensive heart and chronic kidney disease with heart failure and stage 1 through stage 4 chronic kidney disease, or unspecified chronic kidney disease: Secondary | ICD-10-CM | POA: Diagnosis not present

## 2021-08-29 DIAGNOSIS — E119 Type 2 diabetes mellitus without complications: Secondary | ICD-10-CM | POA: Diagnosis not present

## 2021-08-29 DIAGNOSIS — I509 Heart failure, unspecified: Secondary | ICD-10-CM | POA: Diagnosis not present

## 2021-08-29 DIAGNOSIS — I5022 Chronic systolic (congestive) heart failure: Secondary | ICD-10-CM | POA: Diagnosis not present

## 2021-08-29 DIAGNOSIS — E1159 Type 2 diabetes mellitus with other circulatory complications: Secondary | ICD-10-CM | POA: Diagnosis present

## 2021-08-29 DIAGNOSIS — Z79899 Other long term (current) drug therapy: Secondary | ICD-10-CM | POA: Diagnosis not present

## 2021-08-29 DIAGNOSIS — R911 Solitary pulmonary nodule: Secondary | ICD-10-CM | POA: Diagnosis present

## 2021-08-29 DIAGNOSIS — I152 Hypertension secondary to endocrine disorders: Secondary | ICD-10-CM | POA: Diagnosis present

## 2021-08-29 DIAGNOSIS — Z954 Presence of other heart-valve replacement: Secondary | ICD-10-CM | POA: Diagnosis not present

## 2021-08-29 DIAGNOSIS — I5042 Chronic combined systolic (congestive) and diastolic (congestive) heart failure: Secondary | ICD-10-CM | POA: Diagnosis present

## 2021-08-29 DIAGNOSIS — Z006 Encounter for examination for normal comparison and control in clinical research program: Secondary | ICD-10-CM | POA: Diagnosis not present

## 2021-08-29 HISTORY — PX: TRANSCATHETER AORTIC VALVE REPLACEMENT, TRANSFEMORAL: SHX6400

## 2021-08-29 HISTORY — PX: INTRAOPERATIVE TRANSTHORACIC ECHOCARDIOGRAM: SHX6523

## 2021-08-29 HISTORY — DX: Presence of prosthetic heart valve: Z95.2

## 2021-08-29 LAB — ECHOCARDIOGRAM LIMITED
AR max vel: 5.53 cm2
AV Area VTI: 4.93 cm2
AV Area mean vel: 5.37 cm2
AV Mean grad: 3.5 mmHg
AV Peak grad: 6.6 mmHg
Ao pk vel: 1.28 m/s
S' Lateral: 3.6 cm

## 2021-08-29 LAB — POCT I-STAT, CHEM 8
BUN: 14 mg/dL (ref 8–23)
BUN: 13 mg/dL (ref 8–23)
Calcium, Ion: 1.22 mmol/L (ref 1.15–1.40)
Calcium, Ion: 1.17 mmol/L (ref 1.15–1.40)
Chloride: 104 mmol/L (ref 98–111)
Chloride: 105 mmol/L (ref 98–111)
Creatinine, Ser: 1 mg/dL (ref 0.61–1.24)
Creatinine, Ser: 1.1 mg/dL (ref 0.61–1.24)
Glucose, Bld: 110 mg/dL — ABNORMAL HIGH (ref 70–99)
Glucose, Bld: 135 mg/dL — ABNORMAL HIGH (ref 70–99)
HCT: 39 % (ref 39.0–52.0)
HCT: 38 % — ABNORMAL LOW (ref 39.0–52.0)
Hemoglobin: 13.3 g/dL (ref 13.0–17.0)
Hemoglobin: 12.9 g/dL — ABNORMAL LOW (ref 13.0–17.0)
Potassium: 3.6 mmol/L (ref 3.5–5.1)
Potassium: 3.5 mmol/L (ref 3.5–5.1)
Sodium: 142 mmol/L (ref 135–145)
Sodium: 141 mmol/L (ref 135–145)
TCO2: 26 mmol/L (ref 22–32)
TCO2: 23 mmol/L (ref 22–32)

## 2021-08-29 LAB — GLUCOSE, CAPILLARY
Glucose-Capillary: 140 mg/dL — ABNORMAL HIGH (ref 70–99)
Glucose-Capillary: 116 mg/dL — ABNORMAL HIGH (ref 70–99)
Glucose-Capillary: 166 mg/dL — ABNORMAL HIGH (ref 70–99)

## 2021-08-29 LAB — POCT ACTIVATED CLOTTING TIME
Activated Clotting Time: 125 seconds
Activated Clotting Time: 281 seconds
Activated Clotting Time: 107 seconds

## 2021-08-29 LAB — ABO/RH: ABO/RH(D): O POS

## 2021-08-29 SURGERY — IMPLANTATION, AORTIC VALVE, TRANSCATHETER, FEMORAL APPROACH
Anesthesia: Monitor Anesthesia Care

## 2021-08-29 MED ORDER — SERTRALINE HCL 100 MG PO TABS
200.0000 mg | ORAL_TABLET | Freq: Every day | ORAL | Status: DC
  Administered 2021-08-29 – 2021-08-30 (×2): 200 mg via ORAL
  Filled 2021-08-29 (×3): qty 2

## 2021-08-29 MED ORDER — SODIUM CHLORIDE 0.9 % IV SOLN
INTRAVENOUS | Status: AC
Start: 2021-08-29 — End: 2021-08-29
  Administered 2021-08-29: 50 mL/h via INTRAVENOUS

## 2021-08-29 MED ORDER — ONDANSETRON HCL 4 MG/2ML IJ SOLN: 4.0000 mg | Freq: Four times a day (QID) | INTRAMUSCULAR | Status: DC | PRN

## 2021-08-29 MED ORDER — LACTATED RINGERS IV SOLN: INTRAVENOUS | Status: DC

## 2021-08-29 MED ORDER — SODIUM CHLORIDE 0.9 % IV SOLN: 250.0000 mL | INTRAVENOUS | Status: DC | PRN

## 2021-08-29 MED ORDER — ORAL CARE MOUTH RINSE: 15.0000 mL | Freq: Once | OROMUCOSAL | Status: AC

## 2021-08-29 MED ORDER — CHLORHEXIDINE GLUCONATE 4 % EX LIQD: 60.0000 mL | Freq: Once | CUTANEOUS | Status: DC

## 2021-08-29 MED ORDER — ACETAMINOPHEN 650 MG RE SUPP
650.0000 mg | Freq: Four times a day (QID) | RECTAL | Status: DC | PRN
Start: 2021-08-29 — End: 2021-08-30

## 2021-08-29 MED ORDER — LEVOTHYROXINE SODIUM 75 MCG PO TABS
175.0000 ug | ORAL_TABLET | Freq: Every day | ORAL | Status: DC
  Administered 2021-08-30: 175 ug via ORAL
  Filled 2021-08-29: qty 1

## 2021-08-29 MED ORDER — HEPARIN (PORCINE) IN NACL 1000-0.9 UT/500ML-% IV SOLN
INTRAVENOUS | Status: AC
  Filled 2021-08-29: qty 1500

## 2021-08-29 MED ORDER — CARVEDILOL 6.25 MG PO TABS
6.2500 mg | ORAL_TABLET | Freq: Two times a day (BID) | ORAL | Status: DC
  Administered 2021-08-29 – 2021-08-30 (×2): 6.25 mg via ORAL
  Filled 2021-08-29 (×2): qty 1

## 2021-08-29 MED ORDER — CARVEDILOL 6.25 MG PO TABS
6.2500 mg | ORAL_TABLET | ORAL | Status: AC
  Administered 2021-08-29: 6.25 mg via ORAL
  Filled 2021-08-29: qty 1

## 2021-08-29 MED ORDER — PROTAMINE SULFATE 10 MG/ML IV SOLN
INTRAVENOUS | Status: DC | PRN
  Administered 2021-08-29: 150 mg via INTRAVENOUS

## 2021-08-29 MED ORDER — LIDOCAINE HCL (PF) 1 % IJ SOLN
INTRAMUSCULAR | Status: AC
  Filled 2021-08-29: qty 30

## 2021-08-29 MED ORDER — SODIUM CHLORIDE 0.9% FLUSH: 3.0000 mL | INTRAVENOUS | Status: DC | PRN

## 2021-08-29 MED ORDER — INSULIN ASPART 100 UNIT/ML IJ SOLN: 0.0000 [IU] | INTRAMUSCULAR | Status: DC | PRN

## 2021-08-29 MED ORDER — INSULIN ASPART 100 UNIT/ML IJ SOLN
0.0000 [IU] | Freq: Three times a day (TID) | INTRAMUSCULAR | Status: DC
  Administered 2021-08-29: 4 [IU] via SUBCUTANEOUS

## 2021-08-29 MED ORDER — CLEVIDIPINE BUTYRATE 0.5 MG/ML IV EMUL
INTRAVENOUS | Status: DC | PRN
  Administered 2021-08-29: 4 mg/h via INTRAVENOUS

## 2021-08-29 MED ORDER — ATORVASTATIN CALCIUM 40 MG PO TABS
40.0000 mg | ORAL_TABLET | Freq: Every day | ORAL | Status: DC
  Administered 2021-08-29 – 2021-08-30 (×2): 40 mg via ORAL
  Filled 2021-08-29 (×2): qty 1

## 2021-08-29 MED ORDER — IOHEXOL 350 MG/ML SOLN
INTRAVENOUS | Status: DC | PRN
  Administered 2021-08-29: 60 mL via INTRA_ARTERIAL

## 2021-08-29 MED ORDER — CHLORHEXIDINE GLUCONATE 4 % EX LIQD
60.0000 mL | Freq: Once | CUTANEOUS | Status: DC
  Administered 2021-08-29: 4 via TOPICAL

## 2021-08-29 MED ORDER — FAMOTIDINE 20 MG PO TABS
20.0000 mg | ORAL_TABLET | Freq: Every day | ORAL | Status: DC
  Administered 2021-08-29 – 2021-08-30 (×2): 20 mg via ORAL
  Filled 2021-08-29 (×2): qty 1

## 2021-08-29 MED ORDER — DEXMEDETOMIDINE (PRECEDEX) IN NS 20 MCG/5ML (4 MCG/ML) IV SYRINGE
PREFILLED_SYRINGE | INTRAVENOUS | Status: DC | PRN
  Administered 2021-08-29: 109 ug via INTRAVENOUS

## 2021-08-29 MED ORDER — FENTANYL CITRATE (PF) 100 MCG/2ML IJ SOLN
INTRAMUSCULAR | Status: DC | PRN
  Administered 2021-08-29: 50 ug via INTRAVENOUS

## 2021-08-29 MED ORDER — CHLORHEXIDINE GLUCONATE 0.12 % MT SOLN: 15.0000 mL | Freq: Once | OROMUCOSAL | Status: DC

## 2021-08-29 MED ORDER — FENTANYL CITRATE (PF) 100 MCG/2ML IJ SOLN
INTRAMUSCULAR | Status: AC
  Filled 2021-08-29: qty 2

## 2021-08-29 MED ORDER — CEFAZOLIN SODIUM-DEXTROSE 2-4 GM/100ML-% IV SOLN
2.0000 g | Freq: Three times a day (TID) | INTRAVENOUS | Status: AC
  Administered 2021-08-29 – 2021-08-30 (×2): 2 g via INTRAVENOUS
  Filled 2021-08-29 (×3): qty 100

## 2021-08-29 MED ORDER — SODIUM CHLORIDE 0.9% FLUSH
3.0000 mL | Freq: Two times a day (BID) | INTRAVENOUS | Status: DC
  Administered 2021-08-29 – 2021-08-30 (×2): 3 mL via INTRAVENOUS

## 2021-08-29 MED ORDER — OXYCODONE HCL 5 MG PO TABS
5.0000 mg | ORAL_TABLET | ORAL | Status: DC | PRN
  Filled 2021-08-29: qty 2

## 2021-08-29 MED ORDER — SODIUM CHLORIDE 0.9 % IV SOLN: INTRAVENOUS | Status: DC

## 2021-08-29 MED ORDER — ACETAMINOPHEN 325 MG PO TABS
650.0000 mg | ORAL_TABLET | Freq: Four times a day (QID) | ORAL | Status: DC | PRN
  Administered 2021-08-30 (×2): 650 mg via ORAL
  Filled 2021-08-29 (×2): qty 2

## 2021-08-29 MED ORDER — TAMSULOSIN HCL 0.4 MG PO CAPS
0.4000 mg | ORAL_CAPSULE | Freq: Every morning | ORAL | Status: DC
  Administered 2021-08-29 – 2021-08-30 (×2): 0.4 mg via ORAL
  Filled 2021-08-29 (×2): qty 1

## 2021-08-29 MED ORDER — GABAPENTIN 100 MG PO CAPS
100.0000 mg | ORAL_CAPSULE | Freq: Every day | ORAL | Status: DC
  Administered 2021-08-29: 100 mg via ORAL
  Filled 2021-08-29: qty 1

## 2021-08-29 MED ORDER — HEPARIN SODIUM (PORCINE) 1000 UNIT/ML IJ SOLN
INTRAMUSCULAR | Status: DC | PRN
  Administered 2021-08-29: 15000 [IU] via INTRAVENOUS

## 2021-08-29 MED ORDER — NITROGLYCERIN IN D5W 200-5 MCG/ML-% IV SOLN
INTRAVENOUS | Status: AC
  Filled 2021-08-29: qty 250

## 2021-08-29 MED ORDER — HEPARIN (PORCINE) IN NACL 1000-0.9 UT/500ML-% IV SOLN
INTRAVENOUS | Status: DC | PRN
  Administered 2021-08-29 (×3): 500 mL

## 2021-08-29 MED ORDER — PROPOFOL 10 MG/ML IV BOLUS
INTRAVENOUS | Status: DC | PRN
  Administered 2021-08-29: 10 mg via INTRAVENOUS

## 2021-08-29 MED ORDER — ONDANSETRON HCL 4 MG/2ML IJ SOLN
INTRAMUSCULAR | Status: DC | PRN
Start: 2021-08-29 — End: 2021-08-29
  Administered 2021-08-29: 4 mg via INTRAVENOUS

## 2021-08-29 MED ORDER — PROPOFOL 500 MG/50ML IV EMUL
INTRAVENOUS | Status: DC | PRN
  Administered 2021-08-29: 10 ug/kg/min via INTRAVENOUS

## 2021-08-29 MED ORDER — ASPIRIN EC 81 MG PO TBEC
81.0000 mg | DELAYED_RELEASE_TABLET | Freq: Every day | ORAL | Status: DC
  Administered 2021-08-29 – 2021-08-30 (×2): 81 mg via ORAL
  Filled 2021-08-29 (×2): qty 1

## 2021-08-29 MED ORDER — NITROGLYCERIN IN D5W 200-5 MCG/ML-% IV SOLN
0.0000 ug/min | INTRAVENOUS | Status: DC
  Administered 2021-08-29: 10 ug/min via INTRAVENOUS

## 2021-08-29 MED ORDER — CHLORHEXIDINE GLUCONATE 0.12 % MT SOLN
15.0000 mL | Freq: Once | OROMUCOSAL | Status: AC
  Administered 2021-08-29: 15 mL via OROMUCOSAL
  Filled 2021-08-29: qty 15

## 2021-08-29 MED ORDER — CHLORHEXIDINE GLUCONATE 4 % EX LIQD: 30.0000 mL | CUTANEOUS | Status: DC

## 2021-08-29 MED ORDER — LIDOCAINE HCL (PF) 1 % IJ SOLN
INTRAMUSCULAR | Status: DC | PRN
  Administered 2021-08-29 (×2): 10 mL

## 2021-08-29 SURGICAL SUPPLY — 30 items
BAG SNAP BAND KOVER 36X36 (MISCELLANEOUS) ×4 IMPLANT
CABLE ADAPT PACING TEMP 12FT (ADAPTER) ×1 IMPLANT
CATH COMMANDER DELIVERY SYS 29 (CATHETERS) ×1 IMPLANT
CATH DIAG 6FR PIGTAIL ANGLED (CATHETERS) ×2 IMPLANT
CATH INFINITI 6F AL2 (CATHETERS) ×1 IMPLANT
CATH S G BIP PACING (CATHETERS) ×1 IMPLANT
CLOSURE MYNX CONTROL 6F/7F (Vascular Products) ×1 IMPLANT
CLOSURE PERCLOSE PROSTYLE (VASCULAR PRODUCTS) ×2 IMPLANT
CRIMPER (MISCELLANEOUS) ×1 IMPLANT
DEVICE INFLATION ATRION QL38 (MISCELLANEOUS) ×1 IMPLANT
GUIDEWIRE SAFE TJ AMPLATZ EXST (WIRE) ×1 IMPLANT
KIT HEART LEFT (KITS) ×2 IMPLANT
KIT MICROPUNCTURE NIT STIFF (SHEATH) ×1 IMPLANT
KIT SAPIAN 3 ULTRA RESILIA 29 (Valve) ×1 IMPLANT
PACK CARDIAC CATHETERIZATION (CUSTOM PROCEDURE TRAY) ×2 IMPLANT
SHEATH BRITE TIP 7FR 35CM (SHEATH) ×1 IMPLANT
SHEATH INTRODUCER SET 29 (SHEATH) ×1 IMPLANT
SHEATH PINNACLE 6F 10CM (SHEATH) ×1 IMPLANT
SHEATH PINNACLE 8F 10CM (SHEATH) ×1 IMPLANT
SHIELD RADPAD SCOOP 12X17 (MISCELLANEOUS) ×1 IMPLANT
SLEEVE REPOSITIONING LENGTH 30 (MISCELLANEOUS) ×1 IMPLANT
STOPCOCK MORSE 400PSI 3WAY (MISCELLANEOUS) ×4 IMPLANT
SYR MEDRAD MARK V 150ML (SYRINGE) ×1 IMPLANT
TRANSDUCER W/STOPCOCK (MISCELLANEOUS) ×4 IMPLANT
TUBING CONTRAST HIGH PRESS 48 (TUBING) ×1 IMPLANT
VALVE MANIFOLD 3 PORT W/RA/ON (MISCELLANEOUS) ×1 IMPLANT
WIRE EMERALD 3MM-J .035X150CM (WIRE) ×1 IMPLANT
WIRE EMERALD 3MM-J .035X260CM (WIRE) ×1 IMPLANT
WIRE EMERALD ST .035X260CM (WIRE) ×1 IMPLANT
WIRE SAFARI SM CURVE 275 (WIRE) ×1 IMPLANT

## 2021-08-29 NOTE — Progress Notes (Signed)
Mobility Specialist Progress Note ? ? 08/29/21 0315  ?Mobility  ?Activity Ambulated with assistance in hallway  ?Level of Assistance Contact guard assist, steadying assist  ?Assistive Device  ?(IV Pole)  ?Distance Ambulated (ft) 470 ft  ?Activity Response Tolerated well  ?$Mobility charge 1 Mobility  ? ?Pre Mobility: 65 HR, 125/74 BP, 98% SpO2 ?During Mobility: 71 HR, 96%  SpO2 ?Post Mobility: 60 HR, 139/85 BP, 97% SpO2 ? ?Pt received in bed w/ no complaints and agreeable. Groin sites stable pre and post mobility w/ x1 LOB during ambulation that was corrected by pt, stated it was d/t loss of focus. Returned back to bed w/o fault and call bell in reach. ? ?Holland Falling ?Mobility Specialist ?Phone Number [phone] ? ?

## 2021-08-29 NOTE — Transfer of Care (Signed)
Immediate Anesthesia Transfer of Care Note ? ?Patient: Tony [name] ? ?Procedure(s) Performed: Transcatheter Aortic Valve Replacement, Transfemoral ?INTRAOPERATIVE TRANSTHORACIC ECHOCARDIOGRAM ? ?Patient Location: Cath Lab ? ?Anesthesia Type:MAC ? ?Level of Consciousness: awake and alert  ? ?Airway & Oxygen Therapy: Patient Spontanous Breathing and Patient connected to face mask oxygen ? ?Post-op Assessment: Report given to RN and Post -op Vital signs reviewed and stable ? ?Post vital signs: Reviewed and stable ? ?Last Vitals:  ?Vitals Value Taken Time  ?BP 110/54 08/29/21 1114  ?Temp    ?Pulse 59 08/29/21 1115  ?Resp 26 08/29/21 1115  ?SpO2 96 % 08/29/21 1115  ?Vitals shown include unvalidated device data. ? ?Last Pain:  ?Vitals:  ? 08/29/21 0758  ?TempSrc:   ?PainSc: 0-No pain  ?   ? ?Patients Stated Pain Goal: 0 (08/29/21 0758) ? ?Complications: No notable events documented. ?

## 2021-08-29 NOTE — Op Note (Signed)
?[facility]   ?MULTIDISCIPLINARY HEART VALVE TEAM ? ? ?TAVR OPERATIVE NOTE ? ? ?Date of Procedure:  08/29/2021 ? ?Preoperative Diagnosis: Severe Aortic Stenosis  ? ?Postoperative Diagnosis: Same  ? ?Procedure:  ? ?Transcatheter Aortic Valve Replacement - Percutaneous Right Transfemoral Approach ? Edwards Sapien 3 Ultra Resilia THV (size 29 mm, model # 9755RSL, serial # [id number]) ?  ?Co-Surgeons:  Gaye Pollack, MD and [name], MD   ? ? ?Anesthesiologist:  Suella Broad, MD ? ?Echocardiographer:  Jenkins Rouge, Md ? ?Pre-operative Echo Findings: ?Severe aortic stenosis ?moderate left ventricular systolic dysfunction ? ?Post-operative Echo Findings: ?No paravalvular leak ?Improved left ventricular systolic function ? ? ?BRIEF CLINICAL NOTE AND INDICATIONS FOR SURGERY ? ?This 71 year old gentleman has stage D, severe, low-flow/low gradient aortic stenosis with New York Heart Association class II symptoms of exertional fatigue and shortness of breath consistent with chronic diastolic congestive heart failure.  I have personally reviewed his 2D echocardiogram, cardiac catheterization, and CTA studies.  His echo shows a trileaflet aortic valve with thickened and calcified leaflets with restricted mobility.  The mean gradient was 25 mmHg with a dimensionless index of 0.24 and a valve area by VTI of 1.02 cm?.  Left ventricular ejection fraction is 40 to 45%.  Cardiac catheterization showed mild nonobstructive coronary disease.  I agree that aortic valve replacement is indicated in this patient with significant symptoms and progressive left ventricular systolic dysfunction.  His gradient is only 25 mmHg but dimensionless index is 0.24 and his valve looks severely stenotic.  Given his age and comorbidities I think that transcatheter aortic valve replacement would be the best option for treatment.  His gated cardiac CTA shows anatomy suitable for TAVR using a 29 mm SAPIEN 3 valve.  His abdominal and pelvic  CTA shows adequate pelvic vascular anatomy to allow transfemoral insertion. ?  ?The patient was counseled at length regarding treatment alternatives for management of severe symptomatic aortic stenosis. The risks and benefits of surgical intervention has been discussed in detail. Long-term prognosis with medical therapy was discussed. Alternative approaches such as conventional surgical aortic valve replacement, transcatheter aortic valve replacement, and palliative medical therapy were compared and contrasted at length. This discussion was placed in the context of the patient's own specific clinical presentation and past medical history. All of his questions have been addressed.  ?  ?Following the decision to proceed with transcatheter aortic valve replacement, a discussion was held regarding what types of management strategies would be attempted intraoperatively in the event of life-threatening complications, including whether or not the patient would be considered a candidate for the use of cardiopulmonary bypass and/or conversion to open sternotomy for attempted surgical intervention.  I think he would be a candidate for emergent sternotomy to manage any intraoperative complications.  The patient is aware of the fact that transient use of cardiopulmonary bypass may be necessary. The patient has been advised of a variety of complications that might develop including but not limited to risks of death, stroke, paravalvular leak, aortic dissection or other major vascular complications, aortic annulus rupture, device embolization, cardiac rupture or perforation, mitral regurgitation, acute myocardial infarction, arrhythmia, heart block or bradycardia requiring permanent pacemaker placement, congestive heart failure, respiratory failure, renal failure, pneumonia, infection, other late complications related to structural valve deterioration or migration, or other complications that might ultimately cause a temporary or  permanent loss of functional independence or other long term morbidity. The patient provides full informed consent for the procedure as described and  all questions were answered.  ? ? ? ?DETAILS OF THE OPERATIVE PROCEDURE ? ?PREPARATION:   ? ?The patient was brought to the operating room on the above mentioned date and appropriate monitoring was established by the anesthesia team. The patient was placed in the supine position on the operating table.  Intravenous antibiotics were administered. The patient was monitored closely throughout the procedure under conscious sedation.  ? ?Baseline transthoracic echocardiogram was performed. The patient's abdomen and both groins were prepped and draped in a sterile manner. A time out procedure was performed. ? ? ?PERIPHERAL ACCESS:   ? ?Using the modified Seldinger technique, femoral arterial and venous access was obtained with placement of 6 Fr sheaths on the left side.  A pigtail diagnostic catheter was passed through the left arterial sheath under fluoroscopic guidance into the aortic root.  A temporary transvenous pacemaker catheter was passed through the left femoral venous sheath under fluoroscopic guidance into the right ventricle.  The pacemaker was tested to ensure stable lead placement and pacemaker capture. Aortic root angiography was performed in order to determine the optimal angiographic angle for valve deployment. ? ? ?TRANSFEMORAL ACCESS:  ? ?Percutaneous transfemoral access and sheath placement was performed using ultrasound guidance.  The right common femoral artery was cannulated using a micropuncture needle and appropriate location was verified using hand injection angiogram.  A pair of Abbott Perclose percutaneous closure devices were placed and a 6 French sheath replaced into the femoral artery.  The patient was heparinized systemically and ACT verified > 250 seconds.   ? ?A 16 Fr transfemoral E-sheath was introduced into the right common femoral artery  after progressively dilating over an Amplatz superstiff wire. An AL-2 catheter was used to direct a straight-tip exchange length wire across the native aortic valve into the left ventricle. This was exchanged out for a pigtail catheter and position was confirmed in the LV apex. Simultaneous LV and Ao pressures were recorded.  The pigtail catheter was exchanged for a Safari wire in the LV apex.  ? ? ?BALLOON AORTIC VALVULOPLASTY:  ? ?Not performed ? ? ?TRANSCATHETER HEART VALVE DEPLOYMENT:  ? ?An Edwards Sapien 3 Ultra transcatheter heart valve (size 29 mm) was prepared and crimped per manufacturer's guidelines, and the proper orientation of the valve is confirmed on the [employer] delivery system. The valve was advanced through the introducer sheath using normal technique until in an appropriate position in the abdominal aorta beyond the sheath tip. The balloon was then retracted and using the fine-tuning wheel was centered on the valve. The valve was then advanced across the aortic arch using appropriate flexion of the catheter. The valve was carefully positioned across the aortic valve annulus. The Commander catheter was retracted using normal technique. Once final position of the valve has been confirmed by angiographic assessment, the valve is deployed while temporarily holding ventilation and during rapid ventricular pacing to maintain systolic blood pressure < 50 mmHg and pulse pressure < 10 mmHg. The balloon inflation is held for >3 seconds after reaching full deployment volume. Once the balloon has fully deflated the balloon is retracted into the ascending aorta and valve function is assessed using echocardiography. There is felt to be no paravalvular leak and no central aortic insufficiency.  The patient's hemodynamic recovery following valve deployment is good.  The deployment balloon and guidewire are both removed.  ? ? ?PROCEDURE COMPLETION:  ? ?The sheath was removed and femoral artery closure  performed.  Protamine was administered once femoral arterial repair  was complete. The temporary pacemaker, pigtail catheter and femoral sheaths were removed with manual pressure used for hemostasis.  A

## 2021-08-29 NOTE — Interval H&P Note (Signed)
History and Physical Interval Note: ? ?08/29/2021 ?7:02 AM ? ?Tony [name]  has presented today for surgery, with the diagnosis of Severe Aortic Stenosis.  The various methods of treatment have been discussed with the patient and family. After consideration of risks, benefits and other options for treatment, the patient has consented to  Procedure(s): ?Transcatheter Aortic Valve Replacement, Transfemoral (N/A) ?INTRAOPERATIVE TRANSTHORACIC ECHOCARDIOGRAM (N/A) as a surgical intervention.  The patient's history has been reviewed, patient examined, no change in status, stable for surgery.  I have reviewed the patient's chart and labs.  Questions were answered to the patient's satisfaction.   ? ? ?Gaye Pollack ? ? ?

## 2021-08-29 NOTE — Progress Notes (Signed)
?  [facility]   ?MULTIDISCIPLINARY HEART VALVE TEAM ? ?Patient doing well s/p TAVR. He is hemodynamically stable, but BP labile. Groin sites stable. ECG with paced rhythm. Arterial line discontinued and transferred to 4E. Plan for early ambulation after bedrest completed and hopeful discharge over the next 24-48 hours.  ? ?Angelena Form PA-C  [facility]  ?Pager [phone] ? ?

## 2021-08-29 NOTE — Op Note (Signed)
?[facility]   ?MULTIDISCIPLINARY HEART VALVE TEAM ? ? ?TAVR OPERATIVE NOTE ? ? ?Date of Procedure:  08/29/2021 ? ?Preoperative Diagnosis: Severe Aortic Stenosis  ? ?Postoperative Diagnosis: Same  ? ?Procedure:  ? ?Transcatheter Aortic Valve Replacement - Percutaneous Transfemoral Approach ? Edwards Sapien 3 Resilia THV (size 29 mm, serial number [id number]) ?  ?Co-Surgeons:  Gaye Pollack, MD and [name], MD ? ?Anesthesiologist:  Suella Broad, MD ? ?Echocardiographer:  Jenkins Rouge, MD ? ?Pre-operative Echo Findings: ?Severe aortic stenosis ?Moderate left ventricular systolic dysfunction ? ?Post-operative Echo Findings: ?No paravalvular leak ?Improved left ventricular systolic function ? ?BRIEF CLINICAL NOTE AND INDICATIONS FOR SURGERY ? ?71 year old male with progressive, severe, symptomatic aortic stenosis and worsening LV systolic function.  The patient undergoes appropriate preoperative imaging studies with echo and CTA studies.  He is evaluated by the multidisciplinary heart valve team with recommendations to proceed with TAVR for treatment of his severe aortic stenosis. ? ?During the course of the patient's preoperative work up they have been evaluated comprehensively by a multidisciplinary team of specialists coordinated through the [facility] in the [facility].  They have been demonstrated to suffer from symptomatic severe aortic stenosis as noted above. The patient has been counseled extensively as to the relative risks and benefits of all options for the treatment of severe aortic stenosis including long term medical therapy, conventional surgery for aortic valve replacement, and transcatheter aortic valve replacement.  The patient has been independently evaluated in formal cardiac surgical consultation by Dr Cyndia Bent, who deemed the patient appropriate for TAVR. Based upon review of all of the patient's preoperative diagnostic tests  they are felt to be candidate for transcatheter aortic valve replacement using the transfemoral approach as an alternative to conventional surgery.   ? ?Following the decision to proceed with transcatheter aortic valve replacement, a discussion has been held regarding what types of management strategies would be attempted intraoperatively in the event of life-threatening complications, including whether or not the patient would be considered a candidate for the use of cardiopulmonary bypass and/or conversion to open sternotomy for attempted surgical intervention.  The patient has been advised of a variety of complications that might develop peculiar to this approach including but not limited to risks of death, stroke, paravalvular leak, aortic dissection or other major vascular complications, aortic annulus rupture, device embolization, cardiac rupture or perforation, acute myocardial infarction, arrhythmia, heart block or bradycardia requiring permanent pacemaker placement, congestive heart failure, respiratory failure, renal failure, pneumonia, infection, other late complications related to structural valve deterioration or migration, or other complications that might ultimately cause a temporary or permanent loss of functional independence or other long term morbidity.  The patient provides full informed consent for the procedure as described and all questions were answered preoperatively. ? ?DETAILS OF THE OPERATIVE PROCEDURE ? ?PREPARATION:   ?The patient is brought to the operating room on the above mentioned date and central monitoring was established by the anesthesia team including placement of a radial arterial line. The patient is placed in the supine position on the operating table.  Intravenous antibiotics are administered. The patient is monitored closely throughout the procedure under conscious sedation. ? ?Baseline transthoracic echocardiogram is performed. The patient's chest, abdomen, both groins,  and both lower extremities are prepared and draped in a sterile manner. A time out procedure is performed. ? ? ?PERIPHERAL ACCESS:   ?Using ultrasound guidance, femoral arterial and venous access is obtained with placement of  6 Fr sheaths on the left side.  [place] images are digitally captured and stored in the patient's chart. A pigtail diagnostic catheter was passed through the femoral arterial sheath under fluoroscopic guidance into the aortic root.  A temporary transvenous pacemaker catheter was passed through the femoral venous sheath under fluoroscopic guidance into the right ventricle.  The pacemaker was tested to ensure stable lead placement and pacemaker capture. Aortic root angiography was performed in order to determine the optimal angiographic angle for valve deployment. ? ?TRANSFEMORAL ACCESS:  ?A micropuncture technique is used to access the right femoral artery under fluoroscopic and ultrasound guidance.  2 Perclose devices are deployed at 10' and 2' positions to 'PreClose' the femoral artery. An 8 French sheath is placed and then an Amplatz Superstiff wire is advanced through the sheath. This is changed out for a 16 French transfemoral E-Sheath after progressively dilating over the Superstiff wire.  An AL-2 catheter was used to direct a straight-tip exchange length wire across the native aortic valve into the left ventricle. This was exchanged out for a pigtail catheter and position was confirmed in the LV apex. Simultaneous LV and Ao pressures were recorded.  The pigtail catheter was exchanged for an Amplatz Extra-stiff wire in the LV apex.   ? ?BALLOON AORTIC VALVULOPLASTY:  ?Not performed ? ?TRANSCATHETER HEART VALVE DEPLOYMENT:  ?An Edwards Sapien 3 transcatheter heart valve (size 29 mm) was prepared and crimped per manufacturer's guidelines, and the proper orientation of the valve is confirmed on the [employer] delivery system. The valve was advanced through the introducer sheath using normal  technique until in an appropriate position in the abdominal aorta beyond the sheath tip. The balloon was then retracted and using the fine-tuning wheel was centered on the valve. The valve was then advanced across the aortic arch using appropriate flexion of the catheter. The valve was carefully positioned across the aortic valve annulus. The Commander catheter was retracted using normal technique. Once final position of the valve has been confirmed by angiographic assessment, the valve is deployed while temporarily holding ventilation and during rapid ventricular pacing to maintain systolic blood pressure < 50 mmHg and pulse pressure < 10 mmHg. The balloon inflation is held for >3 seconds after reaching full deployment volume. Once the balloon has fully deflated the balloon is retracted into the ascending aorta and valve function is assessed using echocardiography. The patient's hemodynamic recovery following valve deployment is good.  The deployment balloon and guidewire are both removed. Echo demostrated acceptable post-procedural gradients, stable mitral valve function, and no aortic insufficiency.  ? ? ?PROCEDURE COMPLETION:  ?The sheath was removed and femoral artery closure is performed using the 2 previously deployed Perclose devices.  Protamine is administered once femoral arterial repair was complete. The site is clear with no evidence of bleeding or hematoma after the sutures are tightened. The temporary pacemaker and pigtail catheters are removed. Mynx closure is used for contralateral femoral arterial hemostasis for the 6 Fr sheath. ? ?The patient tolerated the procedure well and is transported to the recovery area in stable condition. There were no immediate intraoperative complications. All sponge instrument and needle counts are verified correct at completion of the operation.  ? ?The patient received a total of 60 mL of intravenous contrast during the procedure. ? ? ?[name],  MD ?08/29/2021 ?11:08 AM  ?

## 2021-08-29 NOTE — Progress Notes (Signed)
Tony [name], [employer] rep called, stated a rep was on the way at this time. ?

## 2021-08-29 NOTE — Discharge Instructions (Signed)

## 2021-08-29 NOTE — Anesthesia Preprocedure Evaluation (Addendum)
Anesthesia Evaluation  ?Patient identified by MRN, date of birth, ID band ?Patient awake ? ? ? ?Reviewed: ?Allergy & Precautions, NPO status , Patient's Chart, lab work & pertinent test results ? ?Airway ?Mallampati: II ? ?TM Distance: >3 FB ?Neck ROM: Full ? ? ? Dental ? ?(+) Teeth Intact, Dental Advisory Given ?  ?Pulmonary ?sleep apnea ,  ?  ?breath sounds clear to auscultation ? ? ? ? ? ? Cardiovascular ?hypertension, Pt. on home beta blockers and Pt. on medications ?+CHF  ?+ dysrhythmias + pacemaker + Valvular Problems/Murmurs AS  ?Rhythm:Regular Rate:Normal ?+ Systolic murmurs ?Echo: ??1. Left ventricular ejection fraction, by estimation, is 40 to 45%. Left  ?ventricular ejection fraction by 3D volume is 44 %. The left ventricle has  ?mildly decreased function. The left ventricle demonstrates global  ?hypokinesis. There is severe asymmetric  ??left ventricular hypertrophy of the basal-septal segment. Left  ?ventricular diastolic parameters are consistent with Grade I diastolic  ?dysfunction (impaired relaxation). Elevated left ventricular end-diastolic  ?pressure. The E/e' is 23.  ??2. Right ventricular systolic function is low normal. The right  ?ventricular size is normal. There is normal pulmonary artery systolic  ?pressure. The estimated right ventricular systolic pressure is 38.1 mmHg.  ??3. Left atrial size was mildly dilated.  ??4. The mitral valve is abnormal. Mild to moderate mitral valve  ?regurgitation.  ??5. Calcification and reduced mobility of the right coronary cusp. The  ?aortic valve is tricuspid. Aortic valve regurgitation is trivial. Moderate  ?aortic valve stenosis. Aortic regurgitation PHT measures 621 msec. Aortic  ?valve area, by VTI measures 1.02  ?cm?Marland Kitchen Aortic valve mean gradient measures 25.0 mmHg. Aortic valve Vmax  ?measures 3.38 m/s. DI is 0.24.  ??6. Aortic dilatation noted. There is mild dilatation of the ascending  ?aorta, measuring 41 mm.  ??7.  The inferior vena cava is normal in size with greater than 50%  ?respiratory variability, suggesting right atrial pressure of 3 mmHg.  ??8. Cannot exclude a small PFO.  ??9. Rhythm strip during this exam demostrated a paced rhythm.  ?  ?Neuro/Psych ? Neuromuscular disease negative psych ROS  ? GI/Hepatic ?Neg liver ROS, GERD  Medicated,  ?Endo/Other  ?diabetes, Type 2, Insulin DependentHypothyroidism  ? Renal/GU ?negative Renal ROS  ? ?  ?Musculoskeletal ?negative musculoskeletal ROS ?(+)  ? Abdominal ?Normal abdominal exam  (+)   ?Peds ? Hematology ?negative hematology ROS ?(+)   ?Anesthesia Other Findings ? ? Reproductive/Obstetrics ? ?  ? ? ? ? ? ? ? ? ? ? ? ? ? ?  ?  ? ? ? ? ? ? ? ?Anesthesia Physical ?Anesthesia Plan ? ?ASA: 4 ? ?Anesthesia Plan: MAC  ? ?Post-op Pain Management:   ? ?Induction: Intravenous ? ?PONV Risk Score and Plan: 1 and Propofol infusion and Ondansetron ? ?Airway Management Planned: Natural Airway and Simple Face Mask ? ?Additional Equipment: Arterial line ? ?Intra-op Plan:  ? ?Post-operative Plan:  ? ?Informed Consent: I have reviewed the patients History and Physical, chart, labs and discussed the procedure including the risks, benefits and alternatives for the proposed anesthesia with the patient or authorized representative who has indicated his/her understanding and acceptance.  ? ? ? ? ? ?Plan Discussed with: CRNA ? ?Anesthesia Plan Comments:   ? ? ? ? ? ?Anesthesia Quick Evaluation ? ?

## 2021-08-29 NOTE — Anesthesia Postprocedure Evaluation (Signed)
Anesthesia Post Note ? ?Patient: Tony [name] ? ?Procedure(s) Performed: Transcatheter Aortic Valve Replacement, Transfemoral ?INTRAOPERATIVE TRANSTHORACIC ECHOCARDIOGRAM ? ?  ? ?Patient location during evaluation: PACU ?Anesthesia Type: MAC ?Level of consciousness: awake and alert ?Pain management: pain level controlled ?Vital Signs Assessment: post-procedure vital signs reviewed and stable ?Respiratory status: spontaneous breathing, nonlabored ventilation, respiratory function stable and patient connected to nasal cannula oxygen ?Cardiovascular status: stable and blood pressure returned to baseline ?Postop Assessment: no apparent nausea or vomiting ?Anesthetic complications: no ? ? ?No notable events documented. ? ?Last Vitals:  ?Vitals:  ? 08/29/21 1415 08/29/21 1430  ?BP: 111/75 119/78  ?Pulse: (!) 59 (!) 58  ?Resp: 11 15  ?Temp:    ?SpO2: 96% 96%  ?  ?Last Pain:  ?Vitals:  ? 08/29/21 1337  ?TempSrc: Oral  ?PainSc:   ? ? ?  ?  ?  ?  ?  ?  ? ?Effie Berkshire ? ? ? ? ?

## 2021-08-29 NOTE — Anesthesia Procedure Notes (Signed)
Arterial Line Insertion ?Start/End5/05/2021 8:30 AM, 08/29/2021 8:35 AM ?Performed by: Inda Coke, CRNA, CRNA ? Patient location: Pre-op. ?Preanesthetic checklist: patient identified, IV checked, site marked, risks and benefits discussed, surgical consent, monitors and equipment checked, pre-op evaluation, timeout performed and anesthesia consent ?Lidocaine 1% used for infiltration ?Left, radial was placed ?Catheter size: 20 G ?Hand hygiene performed  and maximum sterile barriers used  ?Allen's test indicative of satisfactory collateral circulation ?Attempts: 1 ?Procedure performed without using ultrasound guided technique. ?Following insertion, dressing applied and Biopatch. ?Post procedure assessment: normal ? ?Patient tolerated the procedure well with no immediate complications. ? ? ?

## 2021-08-29 NOTE — Progress Notes (Signed)
?  Echocardiogram ?2D Echocardiogram has been performed. ? ?Tony [name] ?08/29/2021, 10:51 AM ?

## 2021-08-30 ENCOUNTER — Inpatient Hospital Stay (HOSPITAL_COMMUNITY): Payer: Medicare PPO

## 2021-08-30 DIAGNOSIS — Z954 Presence of other heart-valve replacement: Secondary | ICD-10-CM | POA: Diagnosis not present

## 2021-08-30 DIAGNOSIS — Z952 Presence of prosthetic heart valve: Secondary | ICD-10-CM | POA: Diagnosis not present

## 2021-08-30 DIAGNOSIS — Z006 Encounter for examination for normal comparison and control in clinical research program: Secondary | ICD-10-CM | POA: Diagnosis not present

## 2021-08-30 DIAGNOSIS — I5022 Chronic systolic (congestive) heart failure: Secondary | ICD-10-CM | POA: Diagnosis not present

## 2021-08-30 DIAGNOSIS — I350 Nonrheumatic aortic (valve) stenosis: Secondary | ICD-10-CM | POA: Diagnosis not present

## 2021-08-30 DIAGNOSIS — I130 Hypertensive heart and chronic kidney disease with heart failure and stage 1 through stage 4 chronic kidney disease, or unspecified chronic kidney disease: Secondary | ICD-10-CM | POA: Diagnosis not present

## 2021-08-30 LAB — CBC
HCT: 39.9 % (ref 39.0–52.0)
Hemoglobin: 13 g/dL (ref 13.0–17.0)
MCH: 30.2 pg (ref 26.0–34.0)
MCHC: 32.6 g/dL (ref 30.0–36.0)
MCV: 92.8 fL (ref 80.0–100.0)
Platelets: 151 10*3/uL (ref 150–400)
RBC: 4.3 MIL/uL (ref 4.22–5.81)
RDW: 14.1 % (ref 11.5–15.5)
WBC: 9.7 10*3/uL (ref 4.0–10.5)
nRBC: 0 % (ref 0.0–0.2)

## 2021-08-30 LAB — ECHOCARDIOGRAM COMPLETE
AR max vel: 3.26 cm2
AV Area VTI: 3.9 cm2
AV Area mean vel: 3.67 cm2
AV Mean grad: 7 mmHg
AV Peak grad: 13 mmHg
Ao pk vel: 1.8 m/s
Area-P 1/2: 4.24 cm2
Height: 72 in
S' Lateral: 3.2 cm
Weight: 3955.2 oz

## 2021-08-30 LAB — BASIC METABOLIC PANEL
Anion gap: 8 (ref 5–15)
BUN: 13 mg/dL (ref 8–23)
CO2: 25 mmol/L (ref 22–32)
Calcium: 8.6 mg/dL — ABNORMAL LOW (ref 8.9–10.3)
Chloride: 105 mmol/L (ref 98–111)
Creatinine, Ser: 1.23 mg/dL (ref 0.61–1.24)
GFR, Estimated: >60 mL/min (ref >60)
Glucose, Bld: 151 mg/dL — ABNORMAL HIGH (ref 70–99)
Potassium: 3.8 mmol/L (ref 3.5–5.1)
Sodium: 138 mmol/L (ref 135–145)

## 2021-08-30 LAB — GLUCOSE, CAPILLARY
Glucose-Capillary: 108 mg/dL — ABNORMAL HIGH (ref 70–99)
Glucose-Capillary: 196 mg/dL — ABNORMAL HIGH (ref 70–99)

## 2021-08-30 LAB — MAGNESIUM: Magnesium: 1.6 mg/dL — ABNORMAL LOW (ref 1.7–2.4)

## 2021-08-30 NOTE — Progress Notes (Signed)
Nursing DC note ? ?Patient alert and oriented verbalized understanding of dc instructions. All belongings  and dc papers given to patient.  ?

## 2021-08-30 NOTE — TOC Transition Note (Signed)
Transition of Care (TOC) - CM/SW Discharge Note ?Marvetta Gibbons Therapist, sports, BSN ?Transitions of Care ?Unit 4E- RN Case Manager ?See Treatment Team for direct phone #  ? ? ?Patient Details  ?Name: Tony [name] ?MRN: [id number] ?Date of Birth: 1951/02/20 ? ?Transition of Care (TOC) CM/SW Contact:  ?Dahlia Client, Romeo Rabon, RN ?Phone Number: ?08/30/2021, 12:53 PM ? ? ?Clinical Narrative:    ?Pt s/p TAVR, stable for transition home today. Transition of Care Department [facility]) has reviewed patient and no TOC needs have been identified at this time.  ?Family to transport home.  ? ?Final next level of care: Home/Self Care ?Barriers to Discharge: No Barriers Identified ? ? ?Patient Goals and CMS Choice ?  ?  ?Choice offered to / list presented to : NA ? ?Discharge Placement ?  ?           ? Home ?  ?  ?  ? ?Discharge Plan and Services ?  ?Discharge Planning Services: NA ?Post Acute Care Choice: NA          ?DME Arranged: N/A ?DME Agency: NA ?  ?  ?  ?HH Arranged: NA ?[place] Agency: NA ?  ?  ?  ? ?Social Determinants of Health (SDOH) Interventions ?  ? ? ?Readmission Risk Interventions ? ?  08/30/2021  ? 12:53 PM  ?Readmission Risk Prevention Plan  ?Post Dischage Appt Complete  ?Medication Screening Complete  ?Transportation Screening Complete  ? ? ? ? ? ?

## 2021-08-30 NOTE — Discharge Summary (Addendum)
?[facility]ENTER   ?MULTIDISCIPLINARY HEART VALVE TEAM ? ?Discharge Summary  ?  ?Patient ID: Tony [name] ?MRN: [id number]; DOB: 20-Mar-1951 ? ?Admit date: 08/29/2021 ?Discharge date: 08/30/2021 ? ?Primary Care Provider: Lajean Manes, MD  ?Primary Cardiologist: [name], MD / Dr. Burt Knack & Dr. Cyndia Bent (TAVR).  ? ?Discharge Diagnoses  ?  ?Principal Problem: ?  S/P TAVR (transcatheter aortic valve replacement) ?Active Problems: ?  Severe aortic stenosis ?  Essential hypertension ?  Type 2 diabetes mellitus with peripheral neuropathy (HCC) ?  Hypothyroidism ?  OSA on CPAP ?  Chronic systolic CHF (congestive heart failure) ([place]) ?  Pacemaker ? ? ?Allergies ?Allergies  ?Allergen Reactions  ? Corticosteroids   ?  Per patient's daughter patient is not allergic as of 07/21/21.  ? Codeine Itching  ? Crestor [Rosuvastatin Calcium] Other (See Comments)  ?  Leg pain  ? Simvastatin Other (See Comments)  ?  Leg pain  ? ? ?Diagnostic Studies/Procedures  ?  ?  ?TAVR OPERATIVE NOTE ?  ?  ?Date of Procedure:                08/29/2021 ?  ?Preoperative Diagnosis:      Severe Aortic Stenosis  ?  ?Postoperative Diagnosis:    Same  ?  ?Procedure:      ?  ?Transcatheter Aortic Valve Replacement - Percutaneous Right Transfemoral Approach ?            Edwards Sapien 3 Ultra Resilia THV (size 29 mm, model # 9755RSL, serial # [id number]) ?             ?Co-Surgeons:                        Gaye Pollack, MD and [name], MD   ?  ?  ?Anesthesiologist:                  Suella Broad, MD ?  ?Echocardiographer:              Jenkins Rouge, Md ?  ?Pre-operative Echo Findings: ?Severe aortic stenosis ?moderate left ventricular systolic dysfunction ?  ?Post-operative Echo Findings: ?No paravalvular leak ?Improved left ventricular systolic function ? ?_____________ ?  ? ?Echo 08/30/21: completed but pending formal read at the time of discharge  ? ?History of Present Illness   ?  ?Tony [name] is a 71 y.o. 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  ? ?He has been followed over time for severe AS. Most recent echo showed EF 40-45%, with severe asymmetric left ventricular hypertrophy of the basal-septal segment and severe AS with a mean gradient of 25 mm hg and DVI 0.24 as well as an ascending aorta is mildly dilated at 4.1 cm and mild to moderate mitral valve regurgitation. L/RHC on 08/07/21 showed patent coronary arteries with mild diffuse nonobstructive plaquing noted. He reported having exertional shortness of breath with walking longer distances or up hills and occasional episodes of dizziness when he sits up from laying down.  ? ?The patient has been evaluated by the multidisciplinary valve team and felt to have severe, symptomatic aortic stenosis and to be a suitable candidate for TAVR, which was set up for 08/29/21 ? ?Hospital Course  ?   ?Consultants: none  ? ?  Severe AS: s/p successful TAVR with a 29 mm Edwards Sapien 3 Ultra Resilia THV via the TF approach on 08/29/21. Post operative echo completed but pending formal read. Groin sites are stable. ECG with paced rhythm. Continue Asprin alone. Plan for discharge home with close follow up in the office next week.  ? ?S/p PPM: he has not been seen by EP for quite some time. He also would will get him back in with EP ? ?OSA: continue CPAP ? ?Chronic combined S/D CHF: appears euvolemic. Continue Entreso and carvedilol ? ?DMT2: treated with SSI while admitted. Resume home meds at discharge. ? ?Pulmonary nodule: pre TAVR CT showed a solid pulmonary nodule the left upper lobe measuring 5 mm. No follow-up needed if patient is low-risk. Will discuss in the outpatient setting.  ? ?_____________ ? ?Discharge Vitals ?Blood pressure (!) 157/71, pulse 82, temperature 99.3 ?F (37.4 ?C), temperature source Oral, resp. rate 18, height 6'  (1.829 m), weight 112.1 kg, SpO2 97 %.  ?Filed Weights  ? 08/29/21 0724 08/29/21 0741 08/30/21 0454  ?Weight: (P) 108.9 kg 108.9 kg 112.1 kg  ? ? ?Labs & Radiologic Studies  ?  ?CBC ?Recent Labs  ?  08/29/21 ?1131 08/30/21 ?0151  ?WBC  --  9.7  ?HGB 12.9* 13.0  ?HCT 38.0* 39.9  ?MCV  --  92.8  ?PLT  --  151  ? ?Basic Metabolic Panel ?Recent Labs  ?  08/29/21 ?1131 08/30/21 ?0151  ?NA 141 138  ?K 3.5 3.8  ?CL 105 105  ?CO2  --  25  ?GLUCOSE 135* 151*  ?BUN 13 13  ?CREATININE 1.10 1.23  ?CALCIUM  --  8.6*  ?MG  --  1.6*  ? ?Liver Function Tests ?No results for input(s): AST, ALT, ALKPHOS, BILITOT, PROT, ALBUMIN in the last 72 hours. ?No results for input(s): LIPASE, AMYLASE in the last 72 hours. ?Cardiac Enzymes ?No results for input(s): CKTOTAL, CKMB, CKMBINDEX, TROPONINI in the last 72 hours. ?BNP ?Invalid input(s): POCBNP ?D-Dimer ?No results for input(s): DDIMER in the last 72 hours. ?Hemoglobin A1C ?No results for input(s): HGBA1C in the last 72 hours. ?Fasting Lipid Panel ?No results for input(s): CHOL, HDL, LDLCALC, TRIG, CHOLHDL, LDLDIRECT in the last 72 hours. ?Thyroid Function Tests ?No results for input(s): TSH, T4TOTAL, T3FREE, THYROIDAB in the last 72 hours. ? ?Invalid input(s): FREET3 ?_____________  ?DG Chest 2 View ? ?Result Date: 08/25/2021 ?CLINICAL DATA:  Preoperative evaluation. EXAM: CHEST - 2 VIEW COMPARISON:  Chest radiograph 12/16/2020. FINDINGS: The heart size and mediastinal contours are within normal limits. Both lungs are clear. The visualized skeletal structures are unremarkable. IMPRESSION: No active cardiopulmonary disease. Electronically Signed   By: Lovey Newcomer M.D.   On: 08/25/2021 16:06  ? ?CARDIAC CATHETERIZATION ? ?Result Date: 08/07/2021 ?  There is severe aortic valve stenosis. 1.  Patent coronary arteries with mild diffuse nonobstructive plaquing noted 2.  Calcified, restricted aortic valve on plain fluoroscopy consistent with the patient's known diagnosis of severe aortic  stenosis 3.  Essentially normal right heart pressures with preserved cardiac output Recommend: Continue TAVR evaluation  ? ?CT CORONARY MORPH W/CTA COR W/SCORE W/CA W/CM &/OR WO/CM ? ?Addendum Date: 08/16/2021   ?ADDENDUM REPORT: 08/16/2021 00:06 CLINICAL DATA:  32M with severe aortic stenosis being evaluated for a TAVR procedure. EXAM: Cardiac TAVR CT TECHNIQUE: The patient was scanned on a [employer]. A 120 kV retrospective scan was triggered in the descending thoracic aorta at 111 HU's. Gantry rotation speed was 250  msecs and collimation was .6 mm. No beta blockade or nitro were given. The 3D data set was reconstructed in 5% intervals of the R-R cycle. Systolic and diastolic phases were analyzed on a dedicated work station using MPR, MIP and VRT modes. The patient received 80 cc of contrast. FINDINGS: Aortic Root: Aortic valve: Trileaflet Aortic valve calcium score: 4649 Aortic annulus: Diameter: 32m x 213mPerimeter: 8679mrea: 563 mm^2 Calcifications: Mild calcification adjacent to noncoronary cusp Coronary height: Min Left - 80m73max Left - 16mm79mn Right - 16mm 9mtubular height: Left cusp - 18mm; 52mt cusp - 22mm; N18mronary cusp - 20mm LVO41ms measured 3 mm below the annulus): Diameter: 33mm x 2364mrea:44m mm^2 Calcifications: No calcifications Aortic sinus width: Left cusp - 37mm; Right44mp - 34mm; Noncor84my cusp - 38mm Sinotubu41mjunction width: 31mm x 29mm Op84mm Fl56mscopic Angle for Delivery: LAO 14 CRA 6 Cardiac: Right atrium: Normal size.  Pacemaker lead Right ventricle: Normal size.  Pacemaker lead Pulmonary arteries: Normal size Pulmonary veins: Normal configuration Left atrium: Mild enlargement Left ventricle: Normal size Pericardium: Normal thickness Coronary arteries: Calcium score 503 (70th percentile) IMPRESSION: 1. Trileaflet aortic valve with severe calcifications (AV calcium score 4649) 2. Aortic annulus measures 30mm x 24mm in d54mter 20m perimeter 86mm and area  56322m2. Mild annular calcifications adjacent to noncoronary cusp. No LVOT calcifications. Annular measurements suitable for delivery of 29mm Edwards Sapien82malve 3. Low coronary height to left main (80mm).72m

## 2021-08-30 NOTE — Progress Notes (Signed)
PIVs removed. Telemetry box removed, CCMD notified.  ? ?Daymon Larsen, RN  ? ? ?

## 2021-08-30 NOTE — Progress Notes (Signed)
CARDIAC REHAB PHASE I  ? ?PRE:  Rate/Rhythm: 87 pacing ? ?  BP: sitting 138/83 ? ?  SaO2: 95 RA ? ?MODE:  Ambulation: 880 ft  ? ?POST:  Rate/Rhythm: 105 pacing  ? ?  BP: sitting 141/101  ? ?  SaO2: 96 RA ? ?Pt eager to ambulate. Stood without difficulty. Min assist while walking due to an anterior lean, slight unsteadiness. Denied SOB however with distance he sped up due to this anterior lean. Asked him to rest to recover. More controlled after rest. He then tells me he has and uses a cane PTA. Encouraged walking at home with cane. He likes the [facility] but also interested in [facility]. Will refer to [facility]. Gave DM diet and exercise guidelines. Discussed restrictions.  ?[phone]  ? ?Yves Dill CES, ACSM ?08/30/2021 ?10:11 AM ? ? ? ? ?

## 2021-08-30 NOTE — Progress Notes (Signed)
1 Day Post-Op Procedure(s) (LRB): ?Transcatheter Aortic Valve Replacement, Transfemoral (N/A) ?INTRAOPERATIVE TRANSTHORACIC ECHOCARDIOGRAM (N/A) ?Subjective: ?Feels well. Says he ambulated as soon as he got to the floor last night and breathing was much better. ? ?Objective: ?Vital signs in last 24 hours: ?Temp:  [97.4 ?F (36.3 ?C)-99.2 ?F (37.3 ?C)] 99 ?F (37.2 ?C) (05/03 0756) ?Pulse Rate:  [58-81] 80 (05/03 0441) ?Cardiac Rhythm: A-V Sequential paced (05/03 0350) ?Resp:  [11-20] 18 (05/03 0441) ?BP: (86-214)/(54-123) 157/71 (05/03 0756) ?SpO2:  [90 %-100 %] 96 % (05/03 0441) ?Weight:  [112.1 kg] 112.1 kg (05/03 0454) ? ?Hemodynamic parameters for last 24 hours: ?  ? ?Intake/Output from previous day: ?05/02 0701 - 05/03 0700 ?In: 1600 [P.O.:600; I.V.:1000] ?Out: 725 [Urine:700; Blood:25] ?Intake/Output this shift: ?No intake/output data recorded. ? ?General appearance: alert and cooperative ?Neurologic: intact ?Heart: regular rate and rhythm, S1, S2 normal, no murmur ?Lungs: clear to auscultation bilaterally ?Extremities: extremities normal, warm, no edema ?Wound: groin sites ok ? ?Lab Results: ?Recent Labs  ?  08/29/21 ?3254 08/30/21 ?0151  ?WBC  --  9.7  ?HGB 12.9* 13.0  ?HCT 38.0* 39.9  ?PLT  --  151  ? ?BMET:  ?Recent Labs  ?  08/29/21 ?1131 08/30/21 ?0151  ?NA 141 138  ?K 3.5 3.8  ?CL 105 105  ?CO2  --  25  ?GLUCOSE 135* 151*  ?BUN 13 13  ?CREATININE 1.10 1.23  ?CALCIUM  --  8.6*  ?  ?PT/INR: No results for input(s): LABPROT, INR in the last 72 hours. ?ABG ?   ?Component Value Date/Time  ? PHART 7.376 08/07/2021 0906  ? HCO3 28.5 (H) 08/07/2021 0906  ? HCO3 26.4 08/07/2021 0906  ? TCO2 23 08/29/2021 1131  ? O2SAT 62 08/07/2021 0906  ? O2SAT 92 08/07/2021 0906  ? ?CBG (last 3)  ?Recent Labs  ?  08/29/21 ?1803 08/29/21 ?2126 08/30/21 ?9826  ?GLUCAP 116* 166* 108*  ? ?ECG: atrial sensed, vent paced 73 ? ?Assessment/Plan: ?S/P Procedure(s) (LRB): ?Transcatheter Aortic Valve Replacement, Transfemoral  (N/A) ?INTRAOPERATIVE TRANSTHORACIC ECHOCARDIOGRAM (N/A) ? ?Hemodynamically stable. Can resume preop meds. ? ?2D echo this morning and then plan discharge. ? ? LOS: 1 day  ? ? ?Gaye Pollack ?08/30/2021 ? ? ?

## 2021-08-30 NOTE — Progress Notes (Signed)
?  Echocardiogram ?2D Echocardiogram has been performed. ? Tony [name] ?08/30/2021, 11:45 AM ?

## 2021-08-31 ENCOUNTER — Telehealth: Payer: Self-pay | Admitting: Cardiology

## 2021-08-31 NOTE — Telephone Encounter (Signed)
?  HEART AND VASCULAR CENTER   ?MULTIDISCIPLINARY HEART VALVE TEAM  ? ?Patient contacted regarding discharge from [facility] on 08/30/21 s/p TAVR ?  ?Patient understands to follow up with provider Nell Range at the [place] office. He is doing well with no complaints today.  ?Patient understands discharge instructions? Yes  ?Patient understands medications and regimen? Yes  ?Patient understands to bring all medications to this visit? Yes  ? ?Kathyrn Drown NP-C ?Structural Heart Team  ?Pager: [phone] ? ?  ? ?

## 2021-09-01 ENCOUNTER — Telehealth (HOSPITAL_COMMUNITY): Payer: Self-pay

## 2021-09-01 NOTE — Telephone Encounter (Signed)
Called patient to see if he is interested in the Cardiac Rehab Program. Patient expressed interest. Explained scheduling process and went over insurance, patient verbalized understanding. Will contact patient for scheduling once f/u has been completed.  °

## 2021-09-01 NOTE — Telephone Encounter (Signed)
Pt insurance is active and benefits verified through [place] $20, DED 0/0 met, out of pocket $3,300/$715 met, co-insurance 0%. no pre-authorization required. Passport, 09/01/2021_0 :29pm, REF# [id number] ?  ?Will contact patient to see if he is interested in the Cardiac Rehab Program. If interested, patient will need to complete follow up appt. Once completed, patient will be contacted for scheduling upon review by the RN Navigator. ?

## 2021-09-04 NOTE — Progress Notes (Deleted)
HEART AND [place]                                     Cardiology Office Note:    Date:  09/04/2021   ID:  Tony [name], DOB 06/10/1950, MRN [id number]  PCP:  Lajean Manes, [facility] Cardiologist:  [name], MD / Dr. Burt Knack & Dr. Cyndia Bent (TAVR).  [facility] Electrophysiologist:   Grayer, MD   Referring MD: Lajean Manes, MD   [facility] s/p TAVR  History of Present Illness:    Tony [name] is a 71 y.o. 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   He has been followed over time for severe AS. Most recent echo showed EF 40-45%, with severe asymmetric left ventricular hypertrophy of the basal-septal segment and severe AS with a mean gradient of 25 mm hg and DVI 0.24 as well as an ascending aorta is mildly dilated at 4.1 cm and mild to moderate mitral valve regurgitation. L/RHC on 08/07/21 showed patent coronary arteries with mild diffuse nonobstructive plaquing noted. He reported having exertional shortness of breath with walking longer distances or up hills and occasional episodes of dizziness when he sits up from laying down.    He was evaluated by the multidisciplinary valve team and underwent successful TAVR with a 29 mm Edwards Sapien 3 Ultra Resilia THV via the TF approach on 08/29/21. Post operative echo showed EF 40-45% , normally functioning TAVR with a mean gradient of 7 mmHg and np PVL as well as moderate MR. He was discharged on aspirin alone.   Today the patient presents to clinic for follow up.    Past Medical History:  Diagnosis Date   Cancer [facility])    thyroid   CHF (congestive heart failure) (HCC)    Complete heart block (HCC)    a. 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 Burt Knack and Dr. Cyndia Bent   Severe aortic stenosis    Sleep apnea     Past Surgical History:  Procedure Laterality Date   INTRAOPERATIVE TRANSTHORACIC ECHOCARDIOGRAM N/A 08/29/2021   Procedure: INTRAOPERATIVE TRANSTHORACIC ECHOCARDIOGRAM;  Surgeon: [name], MD;  Location: [place] [facility];  Service: Open Heart Surgery;  Laterality: N/A;   LUMBAR LAMINECTOMY/DECOMPRESSION MICRODISCECTOMY  07/19/2011   Procedure: LUMBAR LAMINECTOMY/DECOMPRESSION MICRODISCECTOMY 3 LEVELS;  Surgeon: Melina Schools, MD;  Location: [facility];  Service: Orthopedics;  Laterality: Left;  Lumbar three-Lumbar five LEFT DECOMPRESSION AND FORAMINOTOMY Lumbar three-four LEFT DISCECTOMY   PERMANENT PACEMAKER INSERTION N/A 08/11/2014   MDT Adapta L implanted by Dr [name] for CHB   RIGHT HEART CATH AND CORONARY ANGIOGRAPHY N/A 08/07/2021   Procedure: RIGHT HEART CATH AND CORONARY ANGIOGRAPHY;  Surgeon: [name], MD;  Location: [place] [facility]  [facility];  Service: Cardiovascular;  Laterality: N/A;   RIGHT/LEFT HEART CATH AND CORONARY ANGIOGRAPHY N/A 08/07/2021   Procedure: RIGHT/LEFT HEART CATH AND CORONARY ANGIOGRAPHY;  Surgeon: [name], MD;  Location: [place] [facility];  Service: Cardiovascular;  Laterality: N/A;   Thyroidectomy x2     TRANSCATHETER AORTIC VALVE REPLACEMENT, TRANSFEMORAL N/A 08/29/2021   Procedure: Transcatheter Aortic Valve Replacement, Transfemoral;  Surgeon: [name], MD;  Location: [place] [facility];  Service: Open Heart Surgery;  Laterality: N/A;    Current Medications: No outpatient medications have been marked as taking for the 09/06/21 encounter (Appointment)  with [facility].     Allergies:   Corticosteroids, Codeine, Crestor [rosuvastatin calcium], and Simvastatin   Social History   Socioeconomic History   Marital status: Married    Spouse name: jody   Number of children: 2   Years of education: college   Highest education level: Not on file  Occupational History   Occupation: retired  Tobacco Use   Smoking status: Never   Smokeless tobacco: Never  Vaping Use   Vaping Use: Never used  Substance and Sexual Activity   Alcohol use: No    Alcohol/week: 0.0 standard drinks   Drug use: No   Sexual activity: Not on file  Other Topics Concern   Not on file  Social History Narrative   Not on file   Social Determinants of Health   Financial Resource Strain: Not on file  Food Insecurity: Not on file  Transportation Needs: Not on file  Physical Activity: Not on file  Stress: Not on file  Social Connections: Not on file     Family History: The patient's family history includes Healthy in his daughter, sister, and son; Heart attack in his father; Heart disease in his father; Lung cancer in his mother. There is no history of Anesthesia problems, Hypotension, or Pseudochol deficiency.  ROS:   Please see the history of present illness.    All other systems reviewed and are negative.  EKGs/Labs/Other Studies Reviewed:    The following studies were reviewed today:  TAVR OPERATIVE NOTE     Date of Procedure:                08/29/2021   Preoperative Diagnosis:      Severe Aortic Stenosis    Postoperative Diagnosis:    Same    Procedure:        Transcatheter Aortic Valve Replacement - Percutaneous Right Transfemoral Approach             Edwards Sapien 3 Ultra Resilia THV (size 29 mm, model # 9755RSL, serial # [id number])              Co-Surgeons:                        Gaye Pollack, MD and [name], MD       Anesthesiologist:                  Suella Broad, MD   Echocardiographer:              Jenkins Rouge, Md   Pre-operative Echo Findings: Severe aortic stenosis moderate left ventricular systolic dysfunction   Post-operative Echo Findings: No paravalvular leak Improved left ventricular systolic function   _____________     Echo 08/30/21:  IMPRESSIONS   1. Global hypokinesis abnormal septam motion septal and apical  hypokinesis . Left ventricular ejection  fraction, by estimation, is 40 to  45%. The left ventricle has mildly decreased function. The left ventricle  demonstrates global hypokinesis. The left  ventricular internal cavity size was moderately dilated. There is severe  left ventricular hypertrophy. Left ventricular diastolic parameters are  consistent with Grade I diastolic dysfunction (impaired relaxation).   2. Pacing wires RA/RV. Right ventricular systolic function is normal. The  right ventricular size is normal.   3. Left atrial size was severely dilated.   4. The pericardial effusion is posterior to the left ventricle and  lateral to the left ventricle.   5. The mitral valve is abnormal. Moderate mitral valve regurgitation. No  evidence of mitral stenosis.   6. Post TAVR with 29 mm Sapien 3 valve mean gradient 7 peak 13 mmHg no  significant PVL . The aortic valve is normal in structure. Aortic valve  regurgitation is not visualized. No aortic stenosis is present. There is a  29 mm Sapien prosthetic (TAVR)  valve present in the aortic position. Procedure Date: 08/29/2021.   7. Aortic dilatation noted. There is mild dilatation of the ascending  aorta, measuring 40 mm.   8. The inferior vena cava is normal in size with greater than 50%  respiratory variability, suggesting right atrial pressure of 3 mmHg.     EKG:  EKG is NOT ordered today.  Recent Labs: 08/25/2021: ALT 16 08/30/2021: BUN 13; Creatinine, Ser 1.23; Hemoglobin 13.0; Magnesium 1.6; Platelets 151; Potassium 3.8; Sodium 138  Recent Lipid Panel No results found for: CHOL, TRIG, HDL, CHOLHDL, VLDL,  LDLCALC, LDLDIRECT   Risk Assessment/Calculations:       Physical Exam:    VS:  There were no vitals taken for this visit.    Wt Readings from Last 3 Encounters:  08/30/21 247 lb 3.2 oz (112.1 kg)  08/25/21 247 lb 3.2 oz (112.1 kg)  08/24/21 243 lb (110.2 kg)     GEN: *** Well nourished, well developed in no acute distress HEENT: Normal NECK: No JVD LYMPHATICS: No lymphadenopathy CARDIAC: ***RRR, no murmurs, rubs, gallops RESPIRATORY:  Clear to auscultation without rales, wheezing or rhonchi  ABDOMEN: Soft, non-tender, non-distended MUSCULOSKELETAL:  No edema; No deformity  SKIN: Warm and dry NEUROLOGIC:  Alert and oriented x 3 PSYCHIATRIC:  Normal affect   ASSESSMENT:    No diagnosis found. PLAN:    In order of problems listed above:  Severe AS s/p TAVR:   Continue Asprin alone.    S/p PPM: he has not been seen by EP for quite some time. Will get him back in with EP.    OSA: continue CPAP   Chronic combined S/D CHF: appears euvolemic. Continue Entreso and carvedilol   Pulmonary nodule: pre TAVR CT showed a solid pulmonary nodule the left upper lobe measuring 5 mm. No follow-up needed if patient is low-risk.    {The patient has an active order for outpatient cardiac rehabilitation.   Please indicate if the patient is ready to start. Do NOT delete this.  It will auto delete.  Refresh note, then sign.              Click here to document readiness and see contraindications.  :1}  Cardiac Rehabilitation Eligibility Assessment      {Are you ordering a CV Procedure (e.g. stress test, cath, DCCV, TEE, etc)?   Press F2        :[id number]}    Medication Adjustments/Labs and Tests Ordered: Current medicines are reviewed at length with the patient  today.  Concerns regarding medicines are outlined above.  No orders of the defined types were placed in this encounter.  No orders of the defined types were placed in this encounter.   There are no Patient  Instructions on file for this visit.   Signed, Angelena Form, PA-C  09/04/2021 3:00 PM    [place]

## 2021-09-06 ENCOUNTER — Ambulatory Visit: Payer: Medicare PPO

## 2021-09-06 ENCOUNTER — Ambulatory Visit: Payer: Medicare PPO | Admitting: Nurse Practitioner

## 2021-09-06 DIAGNOSIS — R911 Solitary pulmonary nodule: Secondary | ICD-10-CM

## 2021-09-06 DIAGNOSIS — Z952 Presence of prosthetic heart valve: Secondary | ICD-10-CM

## 2021-09-06 DIAGNOSIS — I5042 Chronic combined systolic (congestive) and diastolic (congestive) heart failure: Secondary | ICD-10-CM

## 2021-09-06 DIAGNOSIS — G4733 Obstructive sleep apnea (adult) (pediatric): Secondary | ICD-10-CM

## 2021-09-06 DIAGNOSIS — Z950 Presence of cardiac pacemaker: Secondary | ICD-10-CM

## 2021-09-06 NOTE — Progress Notes (Signed)
?HEART AND VASCULAR CENTER   ?[place] ?                                    ?Cardiology Office Note:   ? ?Date:  09/07/2021  ? ?ID:  ZACHERIAH STUMPE, DOB [birth year], MRN [id number] ? ?PCP:  Lajean Manes, MD  ?[facility] Cardiologist:  [name], MD / Dr. Burt Knack & Dr. Cyndia Bent (TAVR).  ?[place] [facility] Electrophysiologist:   Grayer, MD  ? ?Referring MD: Lajean Manes, MD  ? ?[facility] s/p TAVR ? ?History of Present Illness:   ? ?ZYIR GASSERT is a 71 y.o. 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  ? ?He has been followed over time for severe AS. Most recent echo showed EF 40-45%, with severe asymmetric left ventricular hypertrophy of the basal-septal segment and severe AS with a mean gradient of 25 mm hg and DVI 0.24 as well as an ascending aorta is mildly dilated at 4.1 cm and mild to moderate mitral valve regurgitation. L/RHC on 08/07/21 showed patent coronary arteries with mild diffuse nonobstructive plaquing noted. He reported having exertional shortness of breath with walking longer distances or up hills and occasional episodes of dizziness when he sits up from laying down.  ?  ?He was evaluated by the multidisciplinary valve team and underwent successful TAVR with a 29 mm Edwards Sapien 3 Ultra Resilia THV via the TF approach on 08/29/21. Post operative echo showed EF 40-45%, normally functioning TAVR with a mean gradient of 7 mmHg and np PVL as well as moderate MR. He was discharged on aspirin alone.  ? ?Today the patient presents to clinic for follow up. He had some pain across his whole chest once. This has not returned. He also had some subjective feelings of PVCs since TAVR. He also had some right arm heaviness that also resolved. No LE edema, orthopnea or PND. No more dizziness. No syncope. He  can tell a big difference in how he feels since TAVR. Very thankful for his care here.  ? ?Past Medical History:  ?Diagnosis Date  ? Cancer [facility])   ? thyroid  ? CHF (congestive heart failure) ([place])   ? Complete heart block ([place])   ? a. s/p MDT dual chamber PPM followed by Dr [name]   ? Complication of anesthesia   ? 1985 after Thyriodectomy hard time waking up  ? Diabetes mellitus   ? GERD (gastroesophageal reflux disease)   ? Hypertension   ? Hypothyroidism   ? Had two surgeries for Cancer  ? Presence of permanent cardiac pacemaker   ? 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 s/p TAVR with a 29 mm Edwards S3UR via the TF approach by Dr. Burt Knack and Dr. Cyndia Bent  ? Severe aortic stenosis   ? Sleep apnea   ? ? ?Past Surgical History:  ?Procedure Laterality Date  ? INTRAOPERATIVE TRANSTHORACIC ECHOCARDIOGRAM N/A 08/29/2021  ? Procedure: INTRAOPERATIVE TRANSTHORACIC ECHOCARDIOGRAM;  Surgeon: [name], MD;  Location: [place] [facility];  Service: Open Heart Surgery;  Laterality: N/A;  ? LUMBAR LAMINECTOMY/DECOMPRESSION MICRODISCECTOMY  07/19/2011  ? Procedure: LUMBAR LAMINECTOMY/DECOMPRESSION MICRODISCECTOMY 3 LEVELS;  Surgeon: Melina Schools, MD;  Location: [place];  Service: Orthopedics;  Laterality:  Left;  Lumbar three-Lumbar five LEFT DECOMPRESSION AND FORAMINOTOMY Lumbar three-four LEFT DISCECTOMY  ? PERMANENT PACEMAKER INSERTION N/A 08/11/2014  ? MDT Adapta L implanted by Dr [name] for CHB  ? RIGHT HEART CATH AND CORONARY ANGIOGRAPHY N/A 08/07/2021  ? Procedure: RIGHT HEART CATH AND CORONARY ANGIOGRAPHY;  Surgeon: [name], MD;  Location: [place] [facility];  Service: Cardiovascular;  Laterality: N/A;  ? RIGHT/LEFT HEART CATH AND CORONARY ANGIOGRAPHY N/A 08/07/2021  ? Procedure: RIGHT/LEFT HEART CATH AND CORONARY ANGIOGRAPHY;  Surgeon: [name], MD;  Location: [place] [facility];  Service: Cardiovascular;  Laterality: N/A;  ? Thyroidectomy x2    ? TRANSCATHETER AORTIC VALVE  REPLACEMENT, TRANSFEMORAL N/A 08/29/2021  ? Procedure: Transcatheter Aortic Valve Replacement, Transfemoral;  Surgeon: [name], MD;  Location: [place] [facility];  Service: Open Heart Surgery;  Laterality: N/A;  ? ? ?Current Medications: ?Current Meds  ?Medication Sig  ? acetaminophen (TYLENOL) 500 MG tablet Take 1,000 mg by mouth every 6 (six) hours as needed for moderate pain (pain).  ? amoxicillin (AMOXIL) 500 MG tablet Take 4 tablets (2,000 mg total) by mouth as directed. 1 hour prior to dental work including cleanings  ? aspirin EC 81 MG tablet Take 81 mg by mouth daily.  ? atorvastatin (LIPITOR) 40 MG tablet Take 40 mg by mouth daily.  ? BD PEN NEEDLE NANO 2ND GEN 32G X 4 MM MISC   ? canagliflozin (INVOKANA) 300 MG TABS tablet Take 300 mg by mouth daily before breakfast.  ? carvedilol (COREG) 6.25 MG tablet TAKE 1 TABLET(6.25 MG) BY MOUTH TWICE DAILY  ? Continuous Blood Gluc Receiver (FREESTYLE LIBRE 2 READER) DEVI by Does not apply route.  ? cyclobenzaprine (FLEXERIL) 10 MG tablet Take 10 mg by mouth 3 (three) times daily as needed for muscle spasms.  ? Dulaglutide 3 MG/0.5ML SOPN Inject 3 mg into the skin every Friday.  ? famotidine (PEPCID) 20 MG tablet Take 20 mg by mouth daily.  ? gabapentin (NEURONTIN) 100 MG capsule Take 2 capsules (200 mg total) by mouth at bedtime.  ? Insulin Aspart FlexPen (NOVOLOG) 100 UNIT/ML Inject 10 Units into the skin daily.  ? insulin glargine (LANTUS SOLOSTAR) 100 UNIT/ML Solostar Pen Inject 40 Units into the skin 2 (two) times daily.  ? Lancets (ONETOUCH DELICA PLUS BULAGT36I) MISC Apply topically.  ? levothyroxine (SYNTHROID) 200 MCG tablet Take 200 mcg by mouth daily.  ? OVER THE COUNTER MEDICATION Take 2 capsules by mouth at bedtime. Metabo Flex  ? sacubitril-valsartan (ENTRESTO) 49-51 MG Take 1 tablet by mouth 2 (two) times daily.  ? sertraline (ZOLOFT) 100 MG tablet Take 200 mg by mouth daily.   ? Tamsulosin HCl (FLOMAX) 0.4 MG CAPS Take 1 capsule (0.4 mg total)  by mouth every morning.  ? TRULICITY 1.5 WO/0.3OZ SOPN Inject 1.5 mg into the skin every Friday.  ?  ? ?Allergies:   Corticosteroids, Atorvastatin, Codeine, Crestor [rosuvastatin calcium], and Simvastatin  ? ?Social History  ? ?Socioeconomic History  ? Marital status: Married  ?  Spouse name: jody  ? Number of children: 2  ? Years of education: college  ? Highest education level: Not on file  ?Occupational History  ? Occupation: retired  ?Tobacco Use  ? Smoking status: Never  ? Smokeless tobacco: Never  ?Vaping Use  ? Vaping Use: Never used  ?Substance and Sexual Activity  ? Alcohol use: No  ?  Alcohol/week: 0.0 standard drinks  ? Drug use: No  ? Sexual activity: Not  on file  ?Other Topics Concern  ? Not on file  ?Social History Narrative  ? Not on file  ? ?Social Determinants of Health  ? ?Financial Resource Strain: Not on file  ?Food Insecurity: Not on file  ?Transportation Needs: Not on file  ?Physical Activity: Not on file  ?Stress: Not on file  ?Social Connections: Not on file  ?  ? ?Family History: ?The patient's family history includes Healthy in his daughter, sister, and son; Heart attack in his father; Heart disease in his father; Lung cancer in his mother. There is no history of Anesthesia problems, Hypotension, or Pseudochol deficiency. ? ?ROS:   ?Please see the history of present illness.    ?All other systems reviewed and are negative. ? ?EKGs/Labs/Other Studies Reviewed:   ? ?The following studies were reviewed today: ? ?IMPRESSIONS  ? 1. Global hypokinesis abnormal septam motion septal and apical  ?hypokinesis . Left ventricular ejection fraction, by estimation, is 40 to  ?45%. The left ventricle has mildly decreased function. The left ventricle  ?demonstrates global hypokinesis. The left  ?ventricular internal cavity size was moderately dilated. There is severe  ?left ventricular hypertrophy. Left ventricular diastolic parameters are  ?consistent with Grade I diastolic dysfunction (impaired  relaxation).  ? 2. Pacing wires RA/RV. Right ventricular systolic function is normal. The  ?right ventricular size is normal.  ? 3. Left atrial size was severely dilated.  ? 4. The pericardial effusion is posterior to t

## 2021-09-07 ENCOUNTER — Encounter: Payer: Self-pay | Admitting: Physician Assistant

## 2021-09-07 ENCOUNTER — Ambulatory Visit: Payer: Medicare PPO | Admitting: Physician Assistant

## 2021-09-07 VITALS — BP 142/80 | HR 64 | Ht 72.0 in | Wt 250.2 lb

## 2021-09-07 DIAGNOSIS — I5042 Chronic combined systolic (congestive) and diastolic (congestive) heart failure: Secondary | ICD-10-CM | POA: Diagnosis not present

## 2021-09-07 DIAGNOSIS — R911 Solitary pulmonary nodule: Secondary | ICD-10-CM

## 2021-09-07 DIAGNOSIS — E669 Obesity, unspecified: Secondary | ICD-10-CM | POA: Diagnosis not present

## 2021-09-07 DIAGNOSIS — Z794 Long term (current) use of insulin: Secondary | ICD-10-CM | POA: Diagnosis not present

## 2021-09-07 DIAGNOSIS — Z952 Presence of prosthetic heart valve: Secondary | ICD-10-CM | POA: Diagnosis not present

## 2021-09-07 DIAGNOSIS — E7800 Pure hypercholesterolemia, unspecified: Secondary | ICD-10-CM | POA: Insufficient documentation

## 2021-09-07 DIAGNOSIS — Z950 Presence of cardiac pacemaker: Secondary | ICD-10-CM | POA: Diagnosis not present

## 2021-09-07 DIAGNOSIS — E113291 Type 2 diabetes mellitus with mild nonproliferative diabetic retinopathy without macular edema, right eye: Secondary | ICD-10-CM | POA: Insufficient documentation

## 2021-09-07 DIAGNOSIS — E782 Mixed hyperlipidemia: Secondary | ICD-10-CM | POA: Insufficient documentation

## 2021-09-07 DIAGNOSIS — K219 Gastro-esophageal reflux disease without esophagitis: Secondary | ICD-10-CM | POA: Insufficient documentation

## 2021-09-07 DIAGNOSIS — G4733 Obstructive sleep apnea (adult) (pediatric): Secondary | ICD-10-CM

## 2021-09-07 DIAGNOSIS — I459 Conduction disorder, unspecified: Secondary | ICD-10-CM | POA: Insufficient documentation

## 2021-09-07 DIAGNOSIS — F332 Major depressive disorder, recurrent severe without psychotic features: Secondary | ICD-10-CM | POA: Insufficient documentation

## 2021-09-07 DIAGNOSIS — E1165 Type 2 diabetes mellitus with hyperglycemia: Secondary | ICD-10-CM | POA: Diagnosis not present

## 2021-09-07 DIAGNOSIS — E1121 Type 2 diabetes mellitus with diabetic nephropathy: Secondary | ICD-10-CM | POA: Insufficient documentation

## 2021-09-07 DIAGNOSIS — F331 Major depressive disorder, recurrent, moderate: Secondary | ICD-10-CM | POA: Insufficient documentation

## 2021-09-07 DIAGNOSIS — E039 Hypothyroidism, unspecified: Secondary | ICD-10-CM | POA: Diagnosis not present

## 2021-09-07 DIAGNOSIS — I359 Nonrheumatic aortic valve disorder, unspecified: Secondary | ICD-10-CM | POA: Insufficient documentation

## 2021-09-07 DIAGNOSIS — K760 Fatty (change of) liver, not elsewhere classified: Secondary | ICD-10-CM | POA: Insufficient documentation

## 2021-09-07 DIAGNOSIS — I340 Nonrheumatic mitral (valve) insufficiency: Secondary | ICD-10-CM | POA: Insufficient documentation

## 2021-09-07 MED ORDER — AMOXICILLIN 500 MG PO TABS: 2000.0000 mg | ORAL_TABLET | ORAL | 12 refills | Status: DC

## 2021-09-07 NOTE — Patient Instructions (Signed)

## 2021-09-26 NOTE — Progress Notes (Unsigned)
[facility]                                     Cardiology Office Note:    Date:  09/28/2021   ID:  Tony [name], DOB 11-12-1950, MRN [id number]  PCP:  Lajean Manes, [facility] Cardiologist:  [name], MD / Dr. Burt Knack & Dr. Cyndia Bent (TAVR).  [facility] Electrophysiologist:   Grayer, MD   Referring MD: Lajean Manes, MD   1 month s/p TAVR  History of Present Illness:    Tony [name] is a 71 y.o. 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   He has been followed over time for severe AS. Most recent echo showed EF 40-45%, with severe asymmetric left ventricular hypertrophy of the basal-septal segment and severe AS with a mean gradient of 25 mm hg and DVI 0.24 as well as an ascending aorta is mildly dilated at 4.1 cm and mild to moderate mitral valve regurgitation. L/RHC on 08/07/21 showed patent coronary arteries with mild diffuse nonobstructive plaquing noted. He reported having exertional shortness of breath with walking longer distances or up hills and occasional episodes of dizziness when he sits up from laying down.    He was evaluated by the multidisciplinary valve team and underwent successful TAVR with a 29 mm Edwards Sapien 3 Ultra Resilia THV via the TF approach on 08/29/21. Post operative echo showed EF 40-45%, normally functioning TAVR with a mean gradient of 7 mmHg and np PVL as well as moderate MR. He was discharged on aspirin alone. He has done quite well in follow up.   Today the patient presents to clinic for follow up. Here alone. No CP or SOB. No LE edema, orthopnea or PND. No dizziness or syncope. No blood in stool or urine. No palpitations.    Past Medical History:  Diagnosis Date   Cancer [facility])    thyroid   CHF  (congestive heart failure) (HCC)    Complete heart block (HCC)    a. 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 Burt Knack and Dr. Cyndia Bent   Severe aortic stenosis    Sleep apnea     Past Surgical History:  Procedure Laterality Date   INTRAOPERATIVE TRANSTHORACIC ECHOCARDIOGRAM N/A 08/29/2021   Procedure: INTRAOPERATIVE TRANSTHORACIC ECHOCARDIOGRAM;  Surgeon: [name], MD;  Location: [facility];  Service: Open Heart Surgery;  Laterality: N/A;   LUMBAR LAMINECTOMY/DECOMPRESSION MICRODISCECTOMY  07/19/2011   Procedure: LUMBAR LAMINECTOMY/DECOMPRESSION MICRODISCECTOMY 3 LEVELS;  Surgeon: Melina Schools, MD;  Location: [facility];  Service: Orthopedics;  Laterality: Left;  Lumbar three-Lumbar five LEFT DECOMPRESSION AND FORAMINOTOMY Lumbar three-four LEFT DISCECTOMY   PERMANENT PACEMAKER INSERTION N/A 08/11/2014   MDT Adapta L implanted by Dr  Allred for CHB   RIGHT HEART CATH AND CORONARY ANGIOGRAPHY N/A 08/07/2021   Procedure: RIGHT HEART CATH AND CORONARY ANGIOGRAPHY;  Surgeon: [name], MD;  Location: [facility];  Service: Cardiovascular;  Laterality: N/A;   RIGHT/LEFT HEART CATH AND CORONARY ANGIOGRAPHY N/A 08/07/2021   Procedure: RIGHT/LEFT HEART CATH AND CORONARY ANGIOGRAPHY;  Surgeon: [name], MD;  Location: [facility];  Service: Cardiovascular;  Laterality: N/A;   Thyroidectomy x2     TRANSCATHETER AORTIC VALVE REPLACEMENT, TRANSFEMORAL N/A 08/29/2021   Procedure: Transcatheter Aortic Valve Replacement, Transfemoral;  Surgeon: [name], MD;  Location: [facility];   Service: Open Heart Surgery;  Laterality: N/A;    Current Medications: Current Meds  Medication Sig   acetaminophen (TYLENOL) 500 MG tablet Take 1,000 mg by mouth every 6 (six) hours as needed for moderate pain (pain).   amoxicillin (AMOXIL) 500 MG tablet Take 4 tablets (2,000 mg total) by mouth as directed. 1 hour prior to dental work including cleanings   aspirin EC 81 MG tablet Take 81 mg by mouth daily.   atorvastatin (LIPITOR) 40 MG tablet Take 40 mg by mouth daily.   BD PEN NEEDLE NANO 2ND GEN 32G X 4 MM MISC    canagliflozin (INVOKANA) 300 MG TABS tablet Take 300 mg by mouth daily before breakfast.   carvedilol (COREG) 6.25 MG tablet TAKE 1 TABLET(6.25 MG) BY MOUTH TWICE DAILY   Continuous Blood Gluc Receiver (FREESTYLE LIBRE 2 READER) DEVI by Does not apply route.   cyclobenzaprine (FLEXERIL) 10 MG tablet Take 10 mg by mouth 3 (three) times daily as needed for muscle spasms.   Dulaglutide 3 MG/0.5ML SOPN Inject 3 mg into the skin every Friday.   famotidine (PEPCID) 20 MG tablet Take 20 mg by mouth daily.   gabapentin (NEURONTIN) 100 MG capsule Take 2 capsules (200 mg total) by mouth at bedtime.   Insulin Aspart FlexPen (NOVOLOG) 100 UNIT/ML Inject 10 Units into the skin daily.   insulin glargine (LANTUS SOLOSTAR) 100 UNIT/ML Solostar Pen Inject 40 Units into the skin 2 (two) times daily.   Lancets (ONETOUCH DELICA PLUS [id number]) MISC Apply topically.   levothyroxine (SYNTHROID) 200 MCG tablet Take 200 mcg by mouth daily.   OVER THE COUNTER MEDICATION Take 2 capsules by mouth at bedtime. [place] Flex   sacubitril-valsartan (ENTRESTO) 49-51 MG Take 1 tablet by mouth 2 (two) times daily.   sertraline (ZOLOFT) 100 MG tablet Take 200 mg by mouth daily.    Tamsulosin HCl (FLOMAX) 0.4 MG CAPS Take 1 capsule (0.4 mg total) by mouth every morning.   TRULICITY 1.5 QQ/5.9DG SOPN Inject 1.5 mg into the skin every Friday.     Allergies:   Corticosteroids, Atorvastatin, Codeine, Crestor  [rosuvastatin calcium], and Simvastatin   Social History   Socioeconomic History   Marital status: Married    Spouse name: jody   Number of children: 2   Years of education: college   Highest education level: Not on file  Occupational History   Occupation: retired  Tobacco Use   Smoking status: Never   Smokeless tobacco: Never  Vaping Use   Vaping Use: Never used  Substance and Sexual Activity   Alcohol use: No    Alcohol/week: 0.0 standard drinks   Drug use: No   Sexual activity: Not on file  Other Topics Concern   Not on file  Social History Narrative   Not on file   Social Determinants of Health  Financial Resource Strain: Not on file  Food Insecurity: Not on file  Transportation Needs: Not on file  Physical Activity: Not on file  Stress: Not on file  Social Connections: Not on file     Family History: The patient's family history includes Healthy in his daughter, sister, and son; Heart attack in his father; Heart disease in his father; Lung cancer in his mother. There is no history of Anesthesia problems, Hypotension, or Pseudochol deficiency.  ROS:   Please see the history of present illness.    All other systems reviewed and are negative.  EKGs/Labs/Other Studies Reviewed:    The following studies were reviewed today:     TAVR OPERATIVE NOTE     Date of Procedure:                08/29/2021   Preoperative Diagnosis:      Severe Aortic Stenosis    Postoperative Diagnosis:    Same    Procedure:        Transcatheter Aortic Valve Replacement - Percutaneous Right Transfemoral Approach             Edwards Sapien 3 Ultra Resilia THV (size 29 mm, model # 9755RSL, serial # [id number])              Co-Surgeons:                        Gaye Pollack, MD and [name], MD       Anesthesiologist:                  Suella Broad, MD   Echocardiographer:              Jenkins Rouge, Md   Pre-operative Echo Findings: Severe aortic stenosis moderate left  ventricular systolic dysfunction   Post-operative Echo Findings: No paravalvular leak Improved left ventricular systolic function   _____________     Echo 08/30/21:  IMPRESSIONS   1. Global hypokinesis abnormal septam motion septal and apical  hypokinesis . Left ventricular ejection fraction, by estimation, is 40 to  45%. The left ventricle has mildly decreased function. The left ventricle  demonstrates global hypokinesis. The left  ventricular internal cavity size was moderately dilated. There is severe  left ventricular hypertrophy. Left ventricular diastolic parameters are  consistent with Grade I diastolic dysfunction (impaired relaxation).   2. Pacing wires RA/RV. Right ventricular systolic function is normal. The  right ventricular size is normal.   3. Left atrial size was severely dilated.   4. The pericardial effusion is posterior to the left ventricle and  lateral to the left ventricle.   5. The mitral valve is abnormal. Moderate mitral valve regurgitation. No  evidence of mitral stenosis.   6. Post TAVR with 29 mm Sapien 3 valve mean gradient 7 peak 13 mmHg no  significant PVL . The aortic valve is normal in structure. Aortic valve  regurgitation is not visualized. No aortic stenosis is present. There is a  29 mm Sapien prosthetic (TAVR)  valve present in the aortic position. Procedure Date: 08/29/2021.   7. Aortic dilatation noted. There is mild dilatation of the ascending  aorta, measuring 40 mm.   8. The inferior vena cava is normal in size with greater than 50%  respiratory variability, suggesting right atrial pressure of 3 mmHg.   ___________________   Echo 09/27/21 IMPRESSIONS  1. Left ventricular ejection fraction, by estimation, is 40 to 45%. The left  ventricle has mildly decreased function. The left ventricle demonstrates regional wall motion abnormalities. Septal/apical hypokinesis. There is moderate left ventricular  hypertrophy. Left ventricular diastolic  parameters are consistent with Grade II diastolic dysfunction (pseudonormalization). Elevated left atrial pressure.  2. Right ventricular systolic function is normal. The right ventricular size is mildly enlarged. There is normal pulmonary artery systolic pressure. The estimated right ventricular systolic pressure is 06.2 mmHg.  3. Left atrial size was mildly dilated.  4. A small pericardial effusion is present.  5. The mitral valve is normal in structure. Mild mitral valve regurgitation. No evidence of mitral stenosis.  6. There is mild dilatation of the ascending aorta, measuring 39 mm.  7. The inferior vena cava is normal in size with greater than 50% respiratory variability, suggesting right atrial pressure of 3 mmHg.  8. There is a 29 mm Edwards Sapien 3 prosthetic (TAVR) valve present in the aortic position. Procedure Date: 08/29/2021. Echo findings are consistent with normal structure and function of the aortic valve prosthesis. Trivial aortic regurgitation. Vmax 2.4  m/s, MG 12 mmHg, EOA 2.3 cm^2, DI 0.46  EKG:  EKG is NOT ordered today.  Recent Labs: 08/25/2021: ALT 16 08/30/2021: BUN 13; Creatinine, Ser 1.23; Hemoglobin 13.0; Magnesium 1.6; Platelets 151; Potassium 3.8; Sodium 138  Recent Lipid Panel No results found for: CHOL, TRIG, HDL, CHOLHDL, VLDL, LDLCALC, LDLDIRECT   Risk Assessment/Calculations:       Physical Exam:    VS:  BP 120/60   Pulse 80   Ht 6' (1.829 m)   Wt 239 lb (108.4 kg)   SpO2 96%   BMI 32.41 kg/m     Wt Readings from Last 3 Encounters:  09/27/21 239 lb (108.4 kg)  09/07/21 250 lb 3.2 oz (113.5 kg)  08/30/21 247 lb 3.2 oz (112.1 kg)     GEN:  Well nourished, well developed in no acute distress HEENT: Normal NECK: No JVD LYMPHATICS: No lymphadenopathy CARDIAC: RRR, no murmurs, rubs, gallops RESPIRATORY:  Clear to auscultation without rales, wheezing or rhonchi  ABDOMEN: Soft, non-tender, non-distended MUSCULOSKELETAL:  No edema; No deformity   SKIN: Warm and dry NEUROLOGIC:  Alert and oriented x 3 PSYCHIATRIC:  Normal affect   ASSESSMENT:    1. S/P TAVR (transcatheter aortic valve replacement)   2. Pacemaker   3. OSA (obstructive sleep apnea)   4. Chronic combined systolic and diastolic heart failure ([place])   5. Pulmonary nodule     PLAN:    In order of problems listed above:  Severe AS s/p TAVR: echo today shows EF 40-45%, moderate LVH, normally functioning TAVR with a mean gradient of 12 mmHg and no PVL as well as mild MR and small pericardial effusion (old). He has NYHA class I symptoms. Continue aspirin. SBE prophylaxis discussed; he has amoxicillin. I will see him back for 1 year follow up and echo   S/p PPM: he has not been seen by EP for quite some time. He has an apt on 6/14 with Vick Frees PA-C   OSA: continue CPAP   Chronic combined S/D CHF: appears euvolemic. Continue Entreso and carvedilol   Pulmonary nodule: pre TAVR CT showed a solid pulmonary nodule the left upper lobe measuring 5 mm. No follow-up needed if patient is low-risk He is a lifetime non smoker. No follow up indicated.      Cardiac Rehabilitation Eligibility Assessment  The patient is ready to start cardiac rehabilitation from a cardiac standpoint.       Medication  Adjustments/Labs and Tests Ordered: Current medicines are reviewed at length with the patient today.  Concerns regarding medicines are outlined above.  Orders Placed This Encounter  Procedures   ECHOCARDIOGRAM COMPLETE   No orders of the defined types were placed in this encounter.   Patient Instructions  Medication Instructions:  Your physician recommends that you continue on your current medications as directed. Please refer to the Current Medication list given to you today.  *If you need a refill on your cardiac medications before your next appointment, please call your pharmacy*   Lab Work: NONE If you have labs (blood work) drawn today and your tests are  completely normal, you will receive your results only by: [place] (if you have MyChart) OR A paper copy in the mail If you have any lab test that is abnormal or we need to change your treatment, we will call you to review the results.   Testing/Procedures: NONE   Follow-Up: At [facility], you and your health needs are our priority.  As part of our continuing mission to provide you with exceptional heart care, we have created designated Provider Care Teams.  These Care Teams include your primary Cardiologist (physician) and Advanced Practice Providers (APPs -  Physician Assistants and Nurse Practitioners) who all work together to provide you with the care you need, when you need it.  We recommend signing up for the patient portal called "MyChart".  Sign up information is provided on this After Visit Summary.  MyChart is used to connect with patients for Virtual Visits (Telemedicine).  Patients are able to view lab/test results, encounter notes, upcoming appointments, etc.  Non-urgent messages can be sent to your provider as well.   To learn more about what you can do with MyChart, go to [email or web address].    Your next appointment:   5 month(s)  The format for your next appointment:   In Person  Provider:   [name], MD    Important Information About Sugar         Signed, Angelena Form, PA-C  09/28/2021 11:25 AM    [place]

## 2021-09-27 ENCOUNTER — Ambulatory Visit (HOSPITAL_COMMUNITY): Payer: Medicare PPO | Attending: Cardiology

## 2021-09-27 ENCOUNTER — Ambulatory Visit: Payer: Medicare PPO | Admitting: Physician Assistant

## 2021-09-27 VITALS — BP 120/60 | HR 80 | Ht 72.0 in | Wt 239.0 lb

## 2021-09-27 DIAGNOSIS — I5042 Chronic combined systolic (congestive) and diastolic (congestive) heart failure: Secondary | ICD-10-CM

## 2021-09-27 DIAGNOSIS — Z950 Presence of cardiac pacemaker: Secondary | ICD-10-CM

## 2021-09-27 DIAGNOSIS — Z952 Presence of prosthetic heart valve: Secondary | ICD-10-CM

## 2021-09-27 DIAGNOSIS — R911 Solitary pulmonary nodule: Secondary | ICD-10-CM

## 2021-09-27 DIAGNOSIS — G4733 Obstructive sleep apnea (adult) (pediatric): Secondary | ICD-10-CM | POA: Diagnosis not present

## 2021-09-27 NOTE — Patient Instructions (Signed)
Medication Instructions:  Your physician recommends that you continue on your current medications as directed. Please refer to the Current Medication list given to you today.  *If you need a refill on your cardiac medications before your next appointment, please call your pharmacy*   Lab Work: NONE If you have labs (blood work) drawn today and your tests are completely normal, you will receive your results only by: [place] (if you have MyChart) OR A paper copy in the mail If you have any lab test that is abnormal or we need to change your treatment, we will call you to review the results.   Testing/Procedures: NONE   Follow-Up: At [facility], you and your health needs are our priority.  As part of our continuing mission to provide you with exceptional heart care, we have created designated Provider Care Teams.  These Care Teams include your primary Cardiologist (physician) and Advanced Practice Providers (APPs -  Physician Assistants and Nurse Practitioners) who all work together to provide you with the care you need, when you need it.  We recommend signing up for the patient portal called "MyChart".  Sign up information is provided on this After Visit Summary.  MyChart is used to connect with patients for Virtual Visits (Telemedicine).  Patients are able to view lab/test results, encounter notes, upcoming appointments, etc.  Non-urgent messages can be sent to your provider as well.   To learn more about what you can do with MyChart, go to [email or web address].    Your next appointment:   5 month(s)  The format for your next appointment:   In Person  Provider:   [name], MD    Important Information About Sugar

## 2021-09-28 LAB — ECHOCARDIOGRAM COMPLETE
AR max vel: 2.24 cm2
AV Area VTI: 2.35 cm2
AV Area mean vel: 2.2 cm2
AV Mean grad: 10.7 mmHg
AV Peak grad: 21.7 mmHg
Ao pk vel: 2.33 m/s
Area-P 1/2: 2.76 cm2
Calc EF: 42.3 %
S' Lateral: 3.1 cm
Single Plane A2C EF: 47.7 %
Single Plane A4C EF: 35.2 %

## 2021-10-05 ENCOUNTER — Other Ambulatory Visit: Payer: Self-pay | Admitting: Student

## 2021-10-06 ENCOUNTER — Encounter (HOSPITAL_COMMUNITY): Payer: Self-pay

## 2021-10-10 DIAGNOSIS — R002 Palpitations: Secondary | ICD-10-CM | POA: Diagnosis not present

## 2021-10-10 DIAGNOSIS — R42 Dizziness and giddiness: Secondary | ICD-10-CM | POA: Diagnosis not present

## 2021-10-10 DIAGNOSIS — E039 Hypothyroidism, unspecified: Secondary | ICD-10-CM | POA: Diagnosis not present

## 2021-10-11 ENCOUNTER — Encounter: Payer: Self-pay | Admitting: Physician Assistant

## 2021-10-11 ENCOUNTER — Encounter: Payer: Medicare PPO | Admitting: Surgery

## 2021-10-11 ENCOUNTER — Ambulatory Visit: Payer: Medicare PPO | Admitting: Physician Assistant

## 2021-10-11 VITALS — BP 100/60 | HR 91 | Ht 72.0 in | Wt 234.0 lb

## 2021-10-11 DIAGNOSIS — Z950 Presence of cardiac pacemaker: Secondary | ICD-10-CM

## 2021-10-11 DIAGNOSIS — I359 Nonrheumatic aortic valve disorder, unspecified: Secondary | ICD-10-CM | POA: Diagnosis not present

## 2021-10-11 DIAGNOSIS — R42 Dizziness and giddiness: Secondary | ICD-10-CM | POA: Diagnosis not present

## 2021-10-11 DIAGNOSIS — I5022 Chronic systolic (congestive) heart failure: Secondary | ICD-10-CM

## 2021-10-11 DIAGNOSIS — Z952 Presence of prosthetic heart valve: Secondary | ICD-10-CM | POA: Diagnosis not present

## 2021-10-11 LAB — CUP PACEART INCLINIC DEVICE CHECK
Battery Impedance: 851 Ohm
Battery Remaining Longevity: 64 mo
Battery Voltage: 2.77 V
Brady Statistic AP VP Percent: 24 %
Brady Statistic AP VS Percent: 0 %
Brady Statistic AS VP Percent: 75 %
Brady Statistic AS VS Percent: 1 %
Date Time Interrogation Session: 20230614182516
Implantable Lead Implant Date: 20160413
Implantable Lead Implant Date: 20160413
Implantable Lead Location: 753859
Implantable Lead Location: 753860
Implantable Lead Model: 5076
Implantable Lead Model: 5076
Implantable Pulse Generator Implant Date: 20160413
Lead Channel Impedance Value: 462 Ohm
Lead Channel Impedance Value: 546 Ohm
Lead Channel Pacing Threshold Amplitude: 0.5 V
Lead Channel Pacing Threshold Amplitude: 0.5 V
Lead Channel Pacing Threshold Amplitude: 0.5 V
Lead Channel Pacing Threshold Amplitude: 0.625 V
Lead Channel Pacing Threshold Pulse Width: 0.4 ms
Lead Channel Pacing Threshold Pulse Width: 0.4 ms
Lead Channel Pacing Threshold Pulse Width: 0.4 ms
Lead Channel Pacing Threshold Pulse Width: 0.4 ms
Lead Channel Sensing Intrinsic Amplitude: 4 mV
Lead Channel Setting Pacing Amplitude: 2 V
Lead Channel Setting Pacing Amplitude: 2.5 V
Lead Channel Setting Pacing Pulse Width: 0.4 ms
Lead Channel Setting Sensing Sensitivity: 4 mV

## 2021-10-11 MED ORDER — CARVEDILOL 3.125 MG PO TABS
3.1250 mg | ORAL_TABLET | Freq: Two times a day (BID) | ORAL | 2 refills | Status: DC
Start: 2021-10-11 — End: 2023-04-23

## 2021-10-11 NOTE — Patient Instructions (Signed)
Medication Instructions:   START TAKING:  COREG 3.125 MG TWICE A DAY   YOU MAY START TOMORROW  IF BLOOD PRESSURE IF 130 OR HIGHER TOP NUMBER   STOP TAKING AND REMOVE THIS MEDICATION FROM YOUR MEDICATION LIST: ENTRESTO 49-51    *If you need a refill on your cardiac medications before your next appointment, please call your pharmacy*   Lab [place]    If you have labs (blood work) drawn today and your tests are completely normal, you will receive your results only by: [place] (if you have MyChart) OR A paper copy in the mail If you have any lab test that is abnormal or we need to change your treatment, we will call you to review the results.   Testing/Procedures: NONE ORDERED  TODAY    Follow-Up: At [facility], you and your health needs are our priority.  As part of our continuing mission to provide you with exceptional heart care, we have created designated Provider Care Teams.  These Care Teams include your primary Cardiologist (physician) and Advanced Practice Providers (APPs -  Physician Assistants and Nurse Practitioners) who all work together to provide you with the care you need, when you need it.  We recommend signing up for the patient portal called "MyChart".  Sign up information is provided on this After Visit Summary.  MyChart is used to connect with patients for Virtual Visits (Telemedicine).  Patients are able to view lab/test results, encounter notes, upcoming appointments, etc.  Non-urgent messages can be sent to your provider as well.   To learn more about what you can do with MyChart, go to [email or web address].    Your next appointment:    2 week(s)  The format for your next appointment:   In Person  Provider: [place]   Other Instructions   Important Information About Sugar

## 2021-10-11 NOTE — Progress Notes (Signed)
Cardiology Office Note Date:  10/11/2021  Patient ID:  Tony [name], DOB 02/10/1951, MRN [id number] PCP:  Lajean Manes, MD  Cardiologist:  [name] Electrophysiologist: Dr. [name] Structural: Dr. Burt Knack    Chief Complaint: Annual device visit, recent orthostatic dizziness.  History of Present Illness: Tony WALRAVEN is a 71 y.o. male with history of CHB w/PPM, DM, GERD, HTN, thyroid cancer (thyroidectomy) > hypothyroidism, AS (s/p TAVR Aug 29, 2021)m CKD (IIIa), chronic CHF (combined), gait instability.  He comes in today to be seen for Dr. [name], last seen by him Jan 2022  Since then has undergone TAVR Post TAVR EF 40-45%  Most recently he saw K. Grandville Silos, PA-C 09/27/21 for post TAVR f/u, he was doing well, maintained on ASA, discuss dental prophylaxis, planned for annual device visit with EP  TODAY He had been doing pretty well, admits he had hoped his EF and general sens of well  being would be better after the TAVR, but for the most part feeling well. In the last 2-3 days though started feeling a little weak, and particularkly when standing up very lightheaded and dizzy. 2 days ago got up from the sofa to get the door and was not sure he would make it, felt very weak and lightheaded. Yesterday he saw the PA at his PM office, they did an EKG with the mention of some slight flutter in his chest and initial HRs elevated and was told it was normal. He had orthostatics done that were abdnormal and he was told to hold his Coreg until he saw [place] today He had labs done and was told that his TSH was improving from his last synthroid dose adjustment and labs looked ok.  His daughter an anesthesiologist suspected a BP problem for him and to see [place].  Today he continues with an ongoing sense of just not feeling well, a bit lightheaded, no energy He denies any overt symptoms of illness, sinus congestion or symptoms of fever.  He did not take the coreg last night or this Am but did take  the rest of his meds  No CP Since his TAVR he has had an occasional flutter to his heart beat when first up and around as well.  He has not fainted No SOB at rest or with exertion No symptoms of PND or orthopnea  We have been unable to track down fax/EKG/note that was sent to [place] from Dr. Carlyle Lipa office Labs in care everywhere K+ 3.7 BUN/Creat 10/1.16 Glucose 134 TSH 8/88 WBC 8.2 H/H 14/42 Plts 189  Device information MDT dual chamber PPM implanted 08/11/2014   Past Medical History:  Diagnosis Date   Cancer [facility])    thyroid   CHF (congestive heart failure) ([place])    Complete heart block ([place])    a. 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 Burt Knack and Dr. Cyndia Bent   Severe aortic stenosis    Sleep apnea     Past Surgical History:  Procedure Laterality Date   INTRAOPERATIVE TRANSTHORACIC ECHOCARDIOGRAM N/A 08/29/2021  Procedure: INTRAOPERATIVE TRANSTHORACIC ECHOCARDIOGRAM;  Surgeon: [name], MD;  Location: [facility];  Service: Open Heart Surgery;  Laterality: N/A;   LUMBAR LAMINECTOMY/DECOMPRESSION MICRODISCECTOMY  07/19/2011   Procedure: LUMBAR LAMINECTOMY/DECOMPRESSION MICRODISCECTOMY 3 LEVELS;  Surgeon: Melina Schools, MD;  Location: [facility];  Service: Orthopedics;  Laterality: Left;  Lumbar three-Lumbar five LEFT DECOMPRESSION AND FORAMINOTOMY Lumbar three-four LEFT DISCECTOMY   PERMANENT PACEMAKER INSERTION N/A 08/11/2014   MDT Adapta L implanted by Dr [name] for CHB   RIGHT HEART CATH AND CORONARY ANGIOGRAPHY N/A 08/07/2021   Procedure: RIGHT HEART CATH AND CORONARY  ANGIOGRAPHY;  Surgeon: [name], MD;  Location: [facility];  Service: Cardiovascular;  Laterality: N/A;   RIGHT/LEFT HEART CATH AND CORONARY ANGIOGRAPHY N/A 08/07/2021   Procedure: RIGHT/LEFT HEART CATH AND CORONARY ANGIOGRAPHY;  Surgeon: [name], MD;  Location: [facility];  Service: Cardiovascular;  Laterality: N/A;   Thyroidectomy x2     TRANSCATHETER AORTIC VALVE REPLACEMENT, TRANSFEMORAL N/A 08/29/2021   Procedure: Transcatheter Aortic Valve Replacement, Transfemoral;  Surgeon: [name], MD;  Location: [facility];  Service: Open Heart Surgery;  Laterality: N/A;    Current Outpatient Medications  Medication Sig Dispense Refill   acetaminophen (TYLENOL) 500 MG tablet Take 1,000 mg by mouth every 6 (six) hours as needed for moderate pain (pain).     amoxicillin (AMOXIL) 500 MG tablet Take 4 tablets (2,000 mg total) by mouth as directed. 1 hour prior to dental work including cleanings 12 tablet 12   aspirin EC 81 MG tablet Take 81 mg by mouth daily.     atorvastatin (LIPITOR) 40 MG tablet Take 40 mg by mouth daily.     BD PEN NEEDLE NANO 2ND GEN 32G X 4 MM MISC      canagliflozin (INVOKANA) 300 MG TABS tablet Take 300 mg by mouth daily before breakfast.     carvedilol (COREG) 6.25 MG tablet TAKE 1 TABLET(6.25 MG) BY MOUTH TWICE DAILY 60 tablet 0   Continuous Blood Gluc Receiver (FREESTYLE LIBRE 2 READER) DEVI by Does not apply route.     cyclobenzaprine (FLEXERIL) 10 MG tablet Take 10 mg by mouth 3 (three) times daily as needed for muscle spasms.     Dulaglutide 3 MG/0.5ML SOPN Inject 3 mg into the skin every Friday.     famotidine (PEPCID) 20 MG tablet Take 20 mg by mouth daily.     gabapentin (NEURONTIN) 100 MG capsule Take 2 capsules (200 mg total) by mouth at bedtime. 60 capsule 2   Insulin Aspart FlexPen (NOVOLOG) 100 UNIT/ML Inject 10 Units into the skin daily.     insulin glargine (LANTUS SOLOSTAR) 100 UNIT/ML Solostar Pen Inject 40 Units into  the skin 2 (two) times daily.     Lancets (ONETOUCH DELICA PLUS [id number]) MISC Apply topically.     levothyroxine (SYNTHROID) 200 MCG tablet Take 200 mcg by mouth daily.     OVER THE COUNTER MEDICATION Take 2 capsules by mouth at bedtime. Metabo Flex     sacubitril-valsartan (ENTRESTO) 49-51 MG Take 1 tablet by mouth 2 (two) times daily. 60 tablet 8   sertraline (ZOLOFT) 100 MG tablet Take 200 mg by mouth daily.      Tamsulosin HCl (FLOMAX) 0.4 MG CAPS Take 1 capsule (0.4 mg total) by mouth every morning. 30 capsule 0   TRULICITY 1.5 NT/6.1WE SOPN Inject 1.5 mg into the skin every Friday.     No current facility-administered medications for this visit.  Allergies:   Corticosteroids, Atorvastatin, Codeine, Crestor [rosuvastatin calcium], and Simvastatin   Social History:  The patient  reports that he has never smoked. He has never used smokeless tobacco. He reports that he does not drink alcohol and does not use drugs.   Family History:  The patient's family history includes Healthy in his daughter, sister, and son; Heart attack in his father; Heart disease in his father; Lung cancer in his mother.  ROS:  Please see the history of present illness.    All other systems are reviewed and otherwise negative.   PHYSICAL EXAM:  VS:  There were no vitals taken for this visit. BMI: There is no height or weight on file to calculate BMI. Well nourished, well developed, in no acute distress HEENT: normocephalic, atraumatic Neck: no JVD, carotid bruits or masses Cardiac:  RRR; no significant murmurs, no rubs, or gallops Lungs:  CTA b/l, no wheezing, rhonchi or rales Abd: soft, nontender MS: no deformity or atrophy Ext: no edema Skin: warm and dry, no rash Neuro:  No gross deficits appreciated Psych: euthymic mood, full affect  PPM site is stable, no tethering or discomfort   EKG:  done yesterday and reported as normal  Device interrogation done today and reviewed by myself:  Battery  and lead measurements are good Device dependent at 45 today One NSVT 08/31/21, 4 seconds  09/27/21: TTE  1. Left ventricular ejection fraction, by estimation, is 40 to 45%. The  left ventricle has mildly decreased function. The left ventricle  demonstrates regional wall motion abnormalities. Septal/apical  hypokinesis. There is moderate left ventricular  hypertrophy. Left ventricular diastolic parameters are consistent with  Grade II diastolic dysfunction (pseudonormalization). Elevated left atrial  pressure.   2. Right ventricular systolic function is normal. The right ventricular  size is mildly enlarged. There is normal pulmonary artery systolic  pressure. The estimated right ventricular systolic pressure is 42.7 mmHg.   3. Left atrial size was mildly dilated.   4. A small pericardial effusion is present.   5. The mitral valve is normal in structure. Mild mitral valve  regurgitation. No evidence of mitral stenosis.   6. There is mild dilatation of the ascending aorta, measuring 39 mm.   7. The inferior vena cava is normal in size with greater than 50%  respiratory variability, suggesting right atrial pressure of 3 mmHg.   8. There is a 29 mm Edwards Sapien 3 prosthetic (TAVR) valve present in  the aortic position. Procedure Date: 08/29/2021. Echo findings are  consistent with normal structure and function of the aortic valve  prosthesis. Trivial aortic regurgitation. Vmax 2.4   m/s, MG 12 mmHg, EOA 2.3 cm^2, DI 0.46    08/07/21: LHC There is severe aortic valve stenosis. 1.  Patent coronary arteries with mild diffuse nonobstructive plaquing noted 2.  Calcified, restricted aortic valve on plain fluoroscopy consistent with the patient's known diagnosis of severe aortic stenosis 3.  Essentially normal right heart pressures with preserved cardiac output   Recent Labs: 08/25/2021: ALT 16 08/30/2021: BUN 13; Creatinine, Ser 1.23; Hemoglobin 13.0; Magnesium 1.6; Platelets 151; Potassium 3.8;  Sodium 138  No results found for requested labs within last 365 days.   CrCl cannot be calculated (Patient's most recent lab result is older than the maximum 21 days allowed.).   Wt Readings from Last 3 Encounters:  09/27/21 239 lb (108.4 kg)  09/07/21 250 lb 3.2 oz (113.5 kg)  08/30/21 247 lb 3.2 oz (112.1 kg)  Other studies reviewed: Additional studies/records reviewed today include: summarized above  ASSESSMENT AND PLAN:  PPM Intact function, no changes made  Chronic CHF (combined) No symptoms or exam findings of volume OL Suspect he is dry 3. Orthostatic dizziness, hypotension + orthostatic vitals today with symptoms Would prefer to hold his Entresto Tomorrow or once his SBP is 130 resume coreg at 3.'125mg'$  BID  4.  VHD Now s/p TAVR No murmur appreciated today He would like to do cardiac rehab  I will have him f/yu with the structural team in a couple weeks and advance meds if able,and if BP is better get him referred to cardiac rehab post TAVR if they can.    Disposition: F/u with remotes as usual and in clinic with EP  in a year  Current medicines are reviewed at length with the patient today.  The patient did not have any concerns regarding medicines.  Venetia Night, PA-C 10/11/2021 7:24 AM     [address] [place] [address] [phone] (office)  [phone] (fax)

## 2021-10-24 NOTE — Progress Notes (Signed)
[facility]                                     Cardiology Office Note:    Date:  10/27/2021   ID:  Tony [name], DOB 27-Apr-1951, MRN [id number]  PCP:  Tony [name], [name] Cardiologist:  Tony Majestic, MD / Dr. Burt Knack & Dr. Cyndia Bent (TAVR).  [facility] Electrophysiologist:   Grayer, MD   Referring MD: Tony Manes, MD   Follow up dyspnea/dizziness  History of Present Illness:    Tony [name] is a 71 y.o. 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   He has been followed over time for severe AS. Most recent echo showed EF 40-45%, with severe asymmetric left ventricular hypertrophy of the basal-septal segment and severe AS with a mean gradient of 25 mm hg and DVI 0.24 as well as an ascending aorta is mildly dilated at 4.1 cm and mild to moderate mitral valve regurgitation. L/RHC on 08/07/21 showed patent coronary arteries with mild diffuse nonobstructive plaquing noted. He reported having exertional shortness of breath with walking longer distances or up hills and occasional episodes of dizziness when he sits up from laying down.    He was evaluated by the multidisciplinary valve team and underwent successful TAVR with a 29 mm Edwards Sapien 3 Ultra Resilia THV via the TF approach on 08/29/21. Post operative echo showed EF 40-45%, normally functioning TAVR with a mean gradient of 7 mmHg and np PVL as well as moderate MR. He was discharged on aspirin alone. He has done quite well in follow up. 1 month echo 09/27/21 showed EF 40-45%, moderate LVH, normally functioning TAVR with a mean gradient of 12 mmHg and no PVL as well as mild MR and small pericardial effusion (old). He was seen in the office that day and doing well.   He was then seen  back on 6/14 by Tony Frees NP for EP follow up. He reported feeling weak and lightheaded. Coreg had been held. He was asked to hold Entresto.   Labs in care everywhere K+ 3.7 BUN/Creat 10/1.16 Glucose 134 TSH 8/88 WBC 8.2 H/H 14/42 Plts 189   Today the patient presents to clinic for follow up. He is doing much better. Dizziness and weakness resolved. Taking half a coreg 3.'125mg'$  daily and holding entresto. No CP or SOB. No LE edema, orthopnea or PND. No dizziness or syncope. No blood in stool or urine. No palpitations. Wants to start cardiac rehab.   Past Medical History:  Diagnosis Date   Cancer ([place])    thyroid   CHF (congestive heart failure) ([facility])    Complete heart block ([facility])    a. s/p MDT dual chamber PPM followed by Dr Tony [name]    Complication of anesthesia    1985 after Thyriodectomy hard time waking up   Diabetes mellitus    GERD (gastroesophageal reflux disease)    Hypertension    Hypothyroidism    Had two surgeries for Cancer   Presence of permanent cardiac pacemaker    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   s/p TAVR with a 29 mm Edwards S3UR via the TF approach by  Dr. Burt Knack and Dr. Cyndia Bent   Severe aortic stenosis    Sleep apnea     Past Surgical History:  Procedure Laterality Date   INTRAOPERATIVE TRANSTHORACIC ECHOCARDIOGRAM N/A 08/29/2021   Procedure: INTRAOPERATIVE TRANSTHORACIC ECHOCARDIOGRAM;  Surgeon: [name], MD;  Location: [facility];  Service: Open Heart Surgery;  Laterality: N/A;   LUMBAR LAMINECTOMY/DECOMPRESSION MICRODISCECTOMY  07/19/2011   Procedure: LUMBAR LAMINECTOMY/DECOMPRESSION MICRODISCECTOMY 3 LEVELS;  Surgeon: Melina Schools, MD;  Location: [facility];  Service: Orthopedics;  Laterality: Left;  Lumbar three-Lumbar five LEFT DECOMPRESSION AND FORAMINOTOMY Lumbar three-four LEFT DISCECTOMY   PERMANENT PACEMAKER INSERTION N/A 08/11/2014   MDT Adapta L implanted by Dr Tony [name] for CHB   RIGHT HEART CATH AND CORONARY  ANGIOGRAPHY N/A 08/07/2021   Procedure: RIGHT HEART CATH AND CORONARY ANGIOGRAPHY;  Surgeon: [name], MD;  Location: [facility];  Service: Cardiovascular;  Laterality: N/A;   RIGHT/LEFT HEART CATH AND CORONARY ANGIOGRAPHY N/A 08/07/2021   Procedure: RIGHT/LEFT HEART CATH AND CORONARY ANGIOGRAPHY;  Surgeon: [name], MD;  Location: [facility];  Service: Cardiovascular;  Laterality: N/A;   Thyroidectomy x2     TRANSCATHETER AORTIC VALVE REPLACEMENT, TRANSFEMORAL N/A 08/29/2021   Procedure: Transcatheter Aortic Valve Replacement, Transfemoral;  Surgeon: [name], MD;  Location: [facility];  Service: Open Heart Surgery;  Laterality: N/A;    Current Medications: No outpatient medications have been marked as taking for the 10/27/21 encounter (Appointment) with [facility].     Allergies:   Corticosteroids, Atorvastatin, Codeine, Crestor [rosuvastatin calcium], and Simvastatin   Social History   Socioeconomic History   Marital status: Married    Spouse name: Tony [name]   Number of children: 2   Years of education: college   Highest education level: Not on file  Occupational History   Occupation: retired  Tobacco Use   Smoking status: Never   Smokeless tobacco: Never  Vaping Use   Vaping Use: Never used  Substance and Sexual Activity   Alcohol use: No    Alcohol/week: 0.0 standard drinks of alcohol   Drug use: No   Sexual activity: Not on file  Other Topics Concern   Not on file  Social History Narrative   Not on file   Social Determinants of Health   Financial Resource Strain: Not on file  Food Insecurity: Not on file  Transportation Needs: Not on file  Physical Activity: Not on file  Stress: Not on file  Social Connections: Not on file     Family History: The patient's family history includes Healthy in his daughter, sister, and son; Heart attack in his father; Heart disease in his father; Lung cancer in his mother.  There is no history of Anesthesia problems, Hypotension, or Pseudochol deficiency.  ROS:   Please see the history of present illness.    All other systems reviewed and are negative.  EKGs/Labs/Other Studies Reviewed:    The following studies were reviewed today:     TAVR OPERATIVE NOTE     Date of Procedure:                08/29/2021   Preoperative Diagnosis:      Severe Aortic Stenosis    Postoperative Diagnosis:    Same    Procedure:        Transcatheter Aortic Valve Replacement - Percutaneous Right Transfemoral Approach             Edwards Sapien 3 Ultra Resilia THV (size  29 mm, model # 9755RSL, serial # [id number])              Co-Surgeons:                        Gaye Pollack, MD and [name], MD       Anesthesiologist:                  Suella Broad, MD   Echocardiographer:              Jenkins Rouge, Md   Pre-operative Echo Findings: Severe aortic stenosis moderate left ventricular systolic dysfunction   Post-operative Echo Findings: No paravalvular leak Improved left ventricular systolic function   _____________     Echo 08/30/21:  IMPRESSIONS   1. Global hypokinesis abnormal septam motion septal and apical  hypokinesis . Left ventricular ejection fraction, by estimation, is 40 to  45%. The left ventricle has mildly decreased function. The left ventricle  demonstrates global hypokinesis. The left  ventricular internal cavity size was moderately dilated. There is severe  left ventricular hypertrophy. Left ventricular diastolic parameters are  consistent with Grade I diastolic dysfunction (impaired relaxation).   2. Pacing wires RA/RV. Right ventricular systolic function is normal. The  right ventricular size is normal.   3. Left atrial size was severely dilated.   4. The pericardial effusion is posterior to the left ventricle and  lateral to the left ventricle.   5. The mitral valve is abnormal. Moderate mitral valve regurgitation. No  evidence of  mitral stenosis.   6. Post TAVR with 29 mm Sapien 3 valve mean gradient 7 peak 13 mmHg no  significant PVL . The aortic valve is normal in structure. Aortic valve  regurgitation is not visualized. No aortic stenosis is present. There is a  29 mm Sapien prosthetic (TAVR)  valve present in the aortic position. Procedure Date: 08/29/2021.   7. Aortic dilatation noted. There is mild dilatation of the ascending  aorta, measuring 40 mm.   8. The inferior vena cava is normal in size with greater than 50%  respiratory variability, suggesting right atrial pressure of 3 mmHg.   ___________________   Echo 09/27/21 IMPRESSIONS  1. Left ventricular ejection fraction, by estimation, is 40 to 45%. The left ventricle has mildly decreased function. The left ventricle demonstrates regional wall motion abnormalities. Septal/apical hypokinesis. There is moderate left ventricular  hypertrophy. Left ventricular diastolic parameters are consistent with Grade II diastolic dysfunction (pseudonormalization). Elevated left atrial pressure.  2. Right ventricular systolic function is normal. The right ventricular size is mildly enlarged. There is normal pulmonary artery systolic pressure. The estimated right ventricular systolic pressure is 00.9 mmHg.  3. Left atrial size was mildly dilated.  4. A small pericardial effusion is present.  5. The mitral valve is normal in structure. Mild mitral valve regurgitation. No evidence of mitral stenosis.  6. There is mild dilatation of the ascending aorta, measuring 39 mm.  7. The inferior vena cava is normal in size with greater than 50% respiratory variability, suggesting right atrial pressure of 3 mmHg.  8. There is a 29 mm Edwards Sapien 3 prosthetic (TAVR) valve present in the aortic position. Procedure Date: 08/29/2021. Echo findings are consistent with normal structure and function of the aortic valve prosthesis. Trivial aortic regurgitation. Vmax 2.4  m/s, MG 12 mmHg, EOA 2.3  cm^2, DI 0.46  EKG:  EKG is NOT ordered today.  Recent Labs: 08/25/2021: ALT  16 08/30/2021: BUN 13; Creatinine, Ser 1.23; Hemoglobin 13.0; Magnesium 1.6; Platelets 151; Potassium 3.8; Sodium 138  Recent Lipid Panel No results found for: "CHOL", "TRIG", "HDL", "CHOLHDL", "VLDL", "LDLCALC", "LDLDIRECT"   Risk Assessment/Calculations:       Physical Exam:    VS:  There were no vitals taken for this visit.    Wt Readings from Last 3 Encounters:  10/11/21 234 lb (106.1 kg)  09/27/21 239 lb (108.4 kg)  09/07/21 250 lb 3.2 oz (113.5 kg)     GEN:  Well nourished, well developed in no acute distress HEENT: Normal NECK: No JVD LYMPHATICS: No lymphadenopathy CARDIAC: RRR, no murmurs, rubs, gallops RESPIRATORY:  Clear to auscultation without rales, wheezing or rhonchi  ABDOMEN: Soft, non-tender, non-distended MUSCULOSKELETAL:  No edema; No deformity  SKIN: Warm and dry NEUROLOGIC:  Alert and oriented x 3 PSYCHIATRIC:  Normal affect   ASSESSMENT:    1. Orthostatic dizziness   2. S/P TAVR (transcatheter aortic valve replacement)   3. Pacemaker   4. OSA (obstructive sleep apnea)   5. Chronic combined systolic and diastolic heart failure (HCC)      PLAN:    In order of problems listed above:  Orthostatic dizziness: resolved on reduced coreg and off entresto. Will resume coreg at normal dose and add back a Entresto at half dose. I will see him back in 1 month to see how he is doing and try to put him back on previous dose of entresto. He is cleared to start cardiac rehab.  Severe AS s/p TAVR: echo 09/27/21 showed EF 40-45%, moderate LVH, normally functioning TAVR with a mean gradient of 12 mmHg and no PVL as well as mild MR and small pericardial effusion (old). He has NYHA class I symptoms. Continue aspirin. SBE prophylaxis discussed; he has amoxicillin. I will see him back for 1 year follow up and echo   S/p PPM: followed by Tony [name] and Dr. Rayann [name]   OSA: continue CPAP    Chronic combined S/D CHF: appears euvolemic  HTN: BP elevated, see above for med changes.      Cardiac Rehabilitation Eligibility Assessment         Medication Adjustments/Labs and Tests Ordered: Current medicines are reviewed at length with the patient today.  Concerns regarding medicines are outlined above.  No orders of the defined types were placed in this encounter.  No orders of the defined types were placed in this encounter.   There are no Patient Instructions on file for this visit.   Signed, Angelena Form, PA-C  10/27/2021 9:35 AM    [facility]

## 2021-10-27 ENCOUNTER — Ambulatory Visit: Payer: Medicare PPO | Admitting: Physician Assistant

## 2021-10-27 VITALS — BP 150/80 | HR 84 | Ht 72.0 in | Wt 228.8 lb

## 2021-10-27 DIAGNOSIS — I5042 Chronic combined systolic (congestive) and diastolic (congestive) heart failure: Secondary | ICD-10-CM | POA: Diagnosis not present

## 2021-10-27 DIAGNOSIS — R42 Dizziness and giddiness: Secondary | ICD-10-CM

## 2021-10-27 DIAGNOSIS — Z952 Presence of prosthetic heart valve: Secondary | ICD-10-CM | POA: Diagnosis not present

## 2021-10-27 DIAGNOSIS — Z950 Presence of cardiac pacemaker: Secondary | ICD-10-CM

## 2021-10-27 DIAGNOSIS — G4733 Obstructive sleep apnea (adult) (pediatric): Secondary | ICD-10-CM

## 2021-10-27 NOTE — Patient Instructions (Signed)
Medication Instructions:  Increase Carvedilol back to 3.125 mg twice a day Restart Entresto 49/51 mg, taking 1/2 tablet by mouth twice a day   *If you need a refill on your cardiac medications before your next appointment, please call your pharmacy*   Lab Work: None ordered   If you have labs (blood work) drawn today and your tests are completely normal, you will receive your results only by: [place] (if you have MyChart) OR A paper copy in the mail If you have any lab test that is abnormal or we need to change your treatment, we will call you to review the results.   Testing/Procedures: None ordered   Follow-Up: Follow up as scheduled    Other Instructions   Important Information About Sugar

## 2021-11-23 DIAGNOSIS — Z794 Long term (current) use of insulin: Secondary | ICD-10-CM | POA: Diagnosis not present

## 2021-11-23 DIAGNOSIS — E669 Obesity, unspecified: Secondary | ICD-10-CM | POA: Diagnosis not present

## 2021-11-23 DIAGNOSIS — E1165 Type 2 diabetes mellitus with hyperglycemia: Secondary | ICD-10-CM | POA: Diagnosis not present

## 2021-11-23 DIAGNOSIS — E039 Hypothyroidism, unspecified: Secondary | ICD-10-CM | POA: Diagnosis not present

## 2021-11-29 NOTE — Progress Notes (Unsigned)
[facility] [place]                                     Cardiology Office Note:    Date:  11/30/2021   ID:  Tony [name], DOB December 14, 1950, MRN [id number]  PCP:  Tony [name], [name] Cardiologist:  Tony Majestic, MD / Dr. Burt [name] & Dr. Cyndia [name] (TAVR).  [facility] Electrophysiologist:   Grayer, MD   Referring MD: Tony Manes, MD   Follow up   History of Present Illness:    Tony [name] is a 71 y.o. 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   He has been followed over time for severe AS. Most recent echo showed EF 40-45%, with severe asymmetric left ventricular hypertrophy of the basal-septal segment and severe AS with a mean gradient of 25 mm hg and DVI 0.24 as well as an ascending aorta is mildly dilated at 4.1 cm and mild to moderate mitral valve regurgitation. L/RHC on 08/07/21 showed patent coronary arteries with mild diffuse nonobstructive plaquing noted. He reported having exertional shortness of breath with walking longer distances or up hills and occasional episodes of dizziness when he sits up from laying down.    He was evaluated by the multidisciplinary valve team and underwent successful TAVR with a 29 mm Edwards Sapien 3 Ultra Resilia THV via the TF approach on 08/29/21. Post operative echo showed EF 40-45%, normally functioning TAVR with a mean gradient of 7 mmHg and np PVL as well as moderate MR. He was discharged on aspirin alone. He has done quite well in follow up. 1 month echo 09/27/21 showed EF 40-45%, moderate LVH, normally functioning TAVR with a mean gradient of 12 mmHg and no PVL as well as mild MR and small pericardial effusion (old). He was seen in the office that day and doing well.   He was then seen back on 6/14 by  Tony Frees NP for EP follow up. He reported feeling weak and lightheaded. Coreg had been held. He was asked to hold Entresto.   Labs in care everywhere K+ 3.7 BUN/Creat 10/1.16 Glucose 134 TSH 8/88 WBC 8.2 H/H 14/42 Plts 189  Seen for follow up on 10/27/21 and doing well with resolution of dizziness. He was started back on his Entresto at half dose and back on his Coreg at normal dosing.   Today the patient presents to clinic for follow up. Here alone. No CP or SOB. No LE edema, orthopnea or PND. Had one episode of dizziness but no syncope. No blood in stool or urine. No palpitations.    Past Medical History:  Diagnosis Date   Cancer ([place])    thyroid   CHF (congestive heart failure) (HCC)    Complete heart block (HCC)    a. s/p MDT dual chamber PPM followed by Dr Tony [name]    Complication of anesthesia    1985 after Thyriodectomy hard time waking up   Diabetes mellitus    GERD (gastroesophageal reflux disease)    Hypertension    Hypothyroidism    Had two surgeries for Cancer   Presence of permanent cardiac pacemaker    S/P TAVR (transcatheter aortic valve replacement)  08/29/2021   s/p TAVR with a 29 mm Edwards S3UR via the TF approach by Dr. Burt [name] and Dr. Cyndia [name]   Severe aortic stenosis    Sleep apnea     Past Surgical History:  Procedure Laterality Date   INTRAOPERATIVE TRANSTHORACIC ECHOCARDIOGRAM N/A 08/29/2021   Procedure: INTRAOPERATIVE TRANSTHORACIC ECHOCARDIOGRAM;  Surgeon: Tony Mocha, MD;  Location: [place] [facility];  Service: Open Heart Surgery;  Laterality: N/A;   LUMBAR LAMINECTOMY/DECOMPRESSION MICRODISCECTOMY  07/19/2011   Procedure: LUMBAR LAMINECTOMY/DECOMPRESSION MICRODISCECTOMY 3 LEVELS;  Surgeon: Tony Schools, MD;  Location: [facility];  Service: Orthopedics;  Laterality: Left;  Lumbar three-Lumbar five LEFT DECOMPRESSION AND FORAMINOTOMY Lumbar three-four LEFT DISCECTOMY   PERMANENT PACEMAKER INSERTION N/A 08/11/2014   MDT Adapta L implanted by Dr Tony [name]  for CHB   RIGHT HEART CATH AND CORONARY ANGIOGRAPHY N/A 08/07/2021   Procedure: RIGHT HEART CATH AND CORONARY ANGIOGRAPHY;  Surgeon: Tony Mocha, MD;  Location: [place];  Service: Cardiovascular;  Laterality: N/A;   RIGHT/LEFT HEART CATH AND CORONARY ANGIOGRAPHY N/A 08/07/2021   Procedure: RIGHT/LEFT HEART CATH AND CORONARY ANGIOGRAPHY;  Surgeon: Tony Mocha, MD;  Location: [place];  Service: Cardiovascular;  Laterality: N/A;   Thyroidectomy x2     TRANSCATHETER AORTIC VALVE REPLACEMENT, TRANSFEMORAL N/A 08/29/2021   Procedure: Transcatheter Aortic Valve Replacement, Transfemoral;  Surgeon: Tony Mocha, MD;  Location: [facility];  Service: Open Heart Surgery;  Laterality: N/A;    Current Medications: Current Meds  Medication Sig   acetaminophen (TYLENOL) 500 MG tablet Take 1,000 mg by mouth every 6 (six) hours as needed for moderate pain (pain).   amoxicillin (AMOXIL) 500 MG tablet Take 4 tablets (2,000 mg total) by mouth as directed. 1 hour prior to dental work including cleanings   aspirin EC 81 MG tablet Take 81 mg by mouth daily.   atorvastatin (LIPITOR) 40 MG tablet Take 40 mg by mouth daily.   BD PEN NEEDLE NANO 2ND GEN 32G X 4 MM MISC    canagliflozin (INVOKANA) 300 MG TABS tablet Take 300 mg by mouth daily before breakfast.   carvedilol (COREG) 3.125 MG tablet Take 1 tablet (3.125 mg total) by mouth 2 (two) times daily with a meal.   Continuous Blood Gluc Receiver (FREESTYLE LIBRE 2 READER) DEVI by Does not apply route.   cyclobenzaprine (FLEXERIL) 10 MG tablet Take 10 mg by mouth 3 (three) times daily as needed for muscle spasms.   Dulaglutide 3 MG/0.5ML SOPN Inject 3 mg into the skin every Friday.   famotidine (PEPCID) 20 MG tablet Take 20 mg by mouth daily.   gabapentin (NEURONTIN) 100 MG capsule Take 2 capsules (200 mg total) by mouth at bedtime.   Insulin Aspart FlexPen (NOVOLOG) 100 UNIT/ML Inject 10 Units into the skin daily.   insulin  glargine (LANTUS SOLOSTAR) 100 UNIT/ML Solostar Pen Inject 40 Units into the skin 2 (two) times daily.   Lancets (ONETOUCH DELICA PLUS [id number]) MISC Apply topically.   levothyroxine (SYNTHROID) 200 MCG tablet Take 200 mcg by mouth daily.   OVER THE COUNTER MEDICATION Take 2 capsules by mouth at bedtime. [place] Flex   sacubitril-valsartan (ENTRESTO) 24-26 MG Take 1 tablet by mouth 2 (two) times daily.   sertraline (ZOLOFT) 100 MG tablet Take 200 mg by mouth daily.    Tamsulosin HCl (FLOMAX) 0.4 MG CAPS Take 1 capsule (0.4 mg total) by mouth every morning.   TRULICITY 1.5 WU/9.8JX SOPN Inject 1.5 mg into the skin every Friday.   [  DISCONTINUED] sacubitril-valsartan (ENTRESTO) 49-51 MG Take 1 tablet by mouth as directed. Take 1/2 tablet twice a day     Allergies:   Corticosteroids, Atorvastatin, Codeine, Crestor [rosuvastatin calcium], and Simvastatin   Social History   Socioeconomic History   Marital status: Married    Spouse name: jody   Number of children: 2   Years of education: college   Highest education level: Not on file  Occupational History   Occupation: retired  Tobacco Use   Smoking status: Never   Smokeless tobacco: Never  Vaping Use   Vaping Use: Never used  Substance and Sexual Activity   Alcohol use: No    Alcohol/week: 0.0 standard drinks of alcohol   Drug use: No   Sexual activity: Not on file  Other Topics Concern   Not on file  Social History Narrative   Not on file   Social Determinants of Health   Financial Resource Strain: Not on file  Food Insecurity: Not on file  Transportation Needs: Not on file  Physical Activity: Not on file  Stress: Not on file  Social Connections: Not on file     Family History: The patient's family history includes Healthy in his daughter, sister, and son; Heart attack in his father; Heart disease in his father; Lung cancer in his mother. There is no history of Anesthesia problems, Hypotension, or Pseudochol  deficiency.  ROS:   Please see the history of present illness.    All other systems reviewed and are negative.  EKGs/Labs/Other Studies Reviewed:    The following studies were reviewed today:     TAVR OPERATIVE NOTE     Date of Procedure:                08/29/2021   Preoperative Diagnosis:      Severe Aortic Stenosis    Postoperative Diagnosis:    Same    Procedure:        Transcatheter Aortic Valve Replacement - Percutaneous Right Transfemoral Approach             Edwards Sapien 3 Ultra Resilia THV (size 29 mm, model # 9755RSL, serial # [id number])              Co-Surgeons:                        Gaye Pollack, MD and Tony Mocha, MD       Anesthesiologist:                  Suella Broad, MD   Echocardiographer:              Tony Rouge, Md   Pre-operative Echo Findings: Severe aortic stenosis moderate left ventricular systolic dysfunction   Post-operative Echo Findings: No paravalvular leak Improved left ventricular systolic function   _____________     Echo 08/30/21:  IMPRESSIONS   1. Global hypokinesis abnormal septam motion septal and apical  hypokinesis . Left ventricular ejection fraction, by estimation, is 40 to  45%. The left ventricle has mildly decreased function. The left ventricle  demonstrates global hypokinesis. The left  ventricular internal cavity size was moderately dilated. There is severe  left ventricular hypertrophy. Left ventricular diastolic parameters are  consistent with Grade I diastolic dysfunction (impaired relaxation).   2. Pacing wires RA/RV. Right ventricular systolic function is normal. The  right ventricular size is normal.   3. Left atrial size was severely dilated.   4. The  pericardial effusion is posterior to the left ventricle and  lateral to the left ventricle.   5. The mitral valve is abnormal. Moderate mitral valve regurgitation. No  evidence of mitral stenosis.   6. Post TAVR with 29 mm Sapien 3 valve mean gradient 7  peak 13 mmHg no  significant PVL . The aortic valve is normal in structure. Aortic valve  regurgitation is not visualized. No aortic stenosis is present. There is a  29 mm Sapien prosthetic (TAVR)  valve present in the aortic position. Procedure Date: 08/29/2021.   7. Aortic dilatation noted. There is mild dilatation of the ascending  aorta, measuring 40 mm.   8. The inferior vena cava is normal in size with greater than 50%  respiratory variability, suggesting right atrial pressure of 3 mmHg.   ___________________   Echo 09/27/21 IMPRESSIONS  1. Left ventricular ejection fraction, by estimation, is 40 to 45%. The left ventricle has mildly decreased function. The left ventricle demonstrates regional wall motion abnormalities. Septal/apical hypokinesis. There is moderate left ventricular  hypertrophy. Left ventricular diastolic parameters are consistent with Grade II diastolic dysfunction (pseudonormalization). Elevated left atrial pressure.  2. Right ventricular systolic function is normal. The right ventricular size is mildly enlarged. There is normal pulmonary artery systolic pressure. The estimated right ventricular systolic pressure is 25.3 mmHg.  3. Left atrial size was mildly dilated.  4. A small pericardial effusion is present.  5. The mitral valve is normal in structure. Mild mitral valve regurgitation. No evidence of mitral stenosis.  6. There is mild dilatation of the ascending aorta, measuring 39 mm.  7. The inferior vena cava is normal in size with greater than 50% respiratory variability, suggesting right atrial pressure of 3 mmHg.  8. There is a 29 mm Edwards Sapien 3 prosthetic (TAVR) valve present in the aortic position. Procedure Date: 08/29/2021. Echo findings are consistent with normal structure and function of the aortic valve prosthesis. Trivial aortic regurgitation. Vmax 2.4  m/s, MG 12 mmHg, EOA 2.3 cm^2, DI 0.46  EKG:  EKG is NOT ordered today.  Recent  Labs: 08/25/2021: ALT 16 08/30/2021: BUN 13; Creatinine, Ser 1.23; Hemoglobin 13.0; Magnesium 1.6; Platelets 151; Potassium 3.8; Sodium 138  Recent Lipid Panel No results found for: "CHOL", "TRIG", "HDL", "CHOLHDL", "VLDL", "LDLCALC", "LDLDIRECT"   Risk Assessment/Calculations:       Physical Exam:    VS:  BP 112/80   Pulse 70   Ht 6' (1.829 m)   Wt 234 lb (106.1 kg)   SpO2 97%   BMI 31.74 kg/m     Wt Readings from Last 3 Encounters:  11/30/21 234 lb (106.1 kg)  10/27/21 228 lb 12.8 oz (103.8 kg)  10/11/21 234 lb (106.1 kg)     GEN:  Well nourished, well developed in no acute distress HEENT: Normal NECK: No JVD LYMPHATICS: No lymphadenopathy CARDIAC: RRR, no murmurs, rubs, gallops RESPIRATORY:  Clear to auscultation without rales, wheezing or rhonchi  ABDOMEN: Soft, non-tender, non-distended MUSCULOSKELETAL:  No edema; No deformity  SKIN: Warm and dry NEUROLOGIC:  Alert and oriented x 3 PSYCHIATRIC:  Normal affect   ASSESSMENT:    1. Orthostatic dizziness   2. S/P TAVR (transcatheter aortic valve replacement)   3. Pacemaker   4. OSA (obstructive sleep apnea)   5. Chronic combined systolic and diastolic heart failure ([place])   6. Essential hypertension     PLAN:    In order of problems listed above:  Orthostatic dizziness: resolved on half dose  of Entresto. Will keep at same dose.   Severe AS s/p TAVR: echo 09/27/21 showed EF 40-45%, moderate LVH, normally functioning TAVR with a mean gradient of 12 mmHg and no PVL as well as mild MR and small pericardial effusion (old). He has NYHA class I symptoms. Continue aspirin. SBE prophylaxis discussed; he has amoxicillin. I will see him back for 1 year follow up and echo   S/p PPM: followed by Tony [name] and Dr. Rayann [name]   OSA: continue CPAP   Chronic combined S/D CHF: appears euvolemic. Continue half dose of Enresto, will call in 24-'26mg'$  BID given orthostasis on next dose. Continue Coreg 3.'125mg'$  BID. Euvolemic off loop  diuretics.   HTN: BP well controlled today. Keep meds the same.          Medication Adjustments/Labs and Tests Ordered: Current medicines are reviewed at length with the patient today.  Concerns regarding medicines are outlined above.  No orders of the defined types were placed in this encounter.  Meds ordered this encounter  Medications   sacubitril-valsartan (ENTRESTO) 24-26 MG    Sig: Take 1 tablet by mouth 2 (two) times daily.    Dispense:  60 tablet    Refill:  11    Patient Instructions  Medication Instructions:  Your physician has recommended you make the following change in your medication:  TAKE ENTRESTO 24-26 TWICE DAILY  *If you need a refill on your cardiac medications before your next appointment, please call your pharmacy*   Lab Work: NONE If you have labs (blood work) drawn today and your tests are completely normal, you will receive your results only by: [place] (if you have MyChart) OR A paper copy in the mail If you have any lab test that is abnormal or we need to change your treatment, we will call you to review the results.   Testing/Procedures: NONE   Follow-Up: At [facility], you and your health needs are our priority.  As part of our continuing mission to provide you with exceptional heart care, we have created designated Provider Care Teams.  These Care Teams include your primary Cardiologist (physician) and Advanced Practice Providers (APPs -  Physician Assistants and Nurse Practitioners) who all work together to provide you with the care you need, when you need it.  We recommend signing up for the patient portal called "MyChart".  Sign up information is provided on this After Visit Summary.  MyChart is used to connect with patients for Virtual Visits (Telemedicine).  Patients are able to view lab/test results, encounter notes, upcoming appointments, etc.  Non-urgent messages can be sent to your provider as well.   To learn more about  what you can do with MyChart, go to [email or web address].    Your next appointment:   3 month(s)  The format for your next appointment:   In Person  Provider:   [name], MD {    Important Information About Sugar         Signed, Tony Form, Tony [name]  11/30/2021 4:30 PM    [place]

## 2021-11-30 ENCOUNTER — Ambulatory Visit (INDEPENDENT_AMBULATORY_CARE_PROVIDER_SITE_OTHER): Payer: Medicare PPO | Admitting: Physician Assistant

## 2021-11-30 ENCOUNTER — Encounter: Payer: Self-pay | Admitting: Physician Assistant

## 2021-11-30 VITALS — BP 112/80 | HR 70 | Ht 72.0 in | Wt 234.0 lb

## 2021-11-30 DIAGNOSIS — R42 Dizziness and giddiness: Secondary | ICD-10-CM | POA: Diagnosis not present

## 2021-11-30 DIAGNOSIS — Z950 Presence of cardiac pacemaker: Secondary | ICD-10-CM

## 2021-11-30 DIAGNOSIS — I5042 Chronic combined systolic (congestive) and diastolic (congestive) heart failure: Secondary | ICD-10-CM

## 2021-11-30 DIAGNOSIS — Z952 Presence of prosthetic heart valve: Secondary | ICD-10-CM | POA: Diagnosis not present

## 2021-11-30 DIAGNOSIS — I10 Essential (primary) hypertension: Secondary | ICD-10-CM

## 2021-11-30 DIAGNOSIS — G4733 Obstructive sleep apnea (adult) (pediatric): Secondary | ICD-10-CM | POA: Diagnosis not present

## 2021-11-30 MED ORDER — ENTRESTO 24-26 MG PO TABS: 1.0000 | ORAL_TABLET | Freq: Two times a day (BID) | ORAL | 11 refills | Status: DC

## 2021-11-30 NOTE — Patient Instructions (Signed)
Medication Instructions:  Your physician has recommended you make the following change in your medication:  TAKE ENTRESTO 24-26 TWICE DAILY  *If you need a refill on your cardiac medications before your next appointment, please call your pharmacy*   Lab Work: NONE If you have labs (blood work) drawn today and your tests are completely normal, you will receive your results only by: [place] (if you have MyChart) OR A paper copy in the mail If you have any lab test that is abnormal or we need to change your treatment, we will call you to review the results.   Testing/Procedures: NONE   Follow-Up: At [facility], you and your health needs are our priority.  As part of our continuing mission to provide you with exceptional heart care, we have created designated Provider Care Teams.  These Care Teams include your primary Cardiologist (physician) and Advanced Practice Providers (APPs -  Physician Assistants and Nurse Practitioners) who all work together to provide you with the care you need, when you need it.  We recommend signing up for the patient portal called "MyChart".  Sign up information is provided on this After Visit Summary.  MyChart is used to connect with patients for Virtual Visits (Telemedicine).  Patients are able to view lab/test results, encounter notes, upcoming appointments, etc.  Non-urgent messages can be sent to your provider as well.   To learn more about what you can do with MyChart, go to [email or web address].    Your next appointment:   3 month(s)  The format for your next appointment:   In Person  Provider:   [name], MD {    Important Information About Sugar

## 2021-12-15 DIAGNOSIS — M62830 Muscle spasm of back: Secondary | ICD-10-CM | POA: Diagnosis not present

## 2021-12-20 ENCOUNTER — Telehealth (HOSPITAL_COMMUNITY): Payer: Self-pay | Admitting: *Deleted

## 2021-12-21 ENCOUNTER — Encounter (HOSPITAL_COMMUNITY)
Admission: RE | Admit: 2021-12-21 | Discharge: 2021-12-21 | Disposition: A | Discharge status: Home / Self Care | Payer: Medicare PPO | Source: Ambulatory Visit | Attending: Cardiovascular Disease | Admitting: Cardiovascular Disease

## 2021-12-21 ENCOUNTER — Encounter (HOSPITAL_COMMUNITY): Payer: Self-pay

## 2021-12-21 VITALS — BP 134/70 | HR 66 | Ht 71.25 in | Wt 239.0 lb

## 2021-12-21 DIAGNOSIS — Z952 Presence of prosthetic heart valve: Secondary | ICD-10-CM | POA: Diagnosis not present

## 2021-12-21 NOTE — Progress Notes (Signed)
Cardiac Individual Treatment Plan  Patient Details  Name: Tony [name] MRN: [id number] Date of Birth: January 31, 1951 Referring Provider:   Flowsheet Row INTENSIVE CARDIAC REHAB ORIENT from 12/21/2021 in [facility]  Referring Provider [name], MD       Initial Encounter Date:  [facility] from 12/21/2021 in [facility]  Date 12/21/21       Visit Diagnosis: 08/29/21 S/P TAVR   Patient's Home Medications on Admission:  Current Outpatient Medications:    acetaminophen (TYLENOL) 500 MG tablet, Take 500 mg by mouth every 6 (six) hours as needed for moderate pain (pain)., Disp: , Rfl:    amoxicillin (AMOXIL) 500 MG tablet, Take 4 tablets (2,000 mg total) by mouth as directed. 1 hour prior to dental work including cleanings, Disp: 12 tablet, Rfl: 12   aspirin EC 81 MG tablet, Take 81 mg by mouth daily., Disp: , Rfl:    atorvastatin (LIPITOR) 40 MG tablet, Take 40 mg by mouth daily., Disp: , Rfl:    canagliflozin (INVOKANA) 300 MG TABS tablet, Take 300 mg by mouth daily before breakfast., Disp: , Rfl:    carvedilol (COREG) 3.125 MG tablet, Take 1 tablet (3.125 mg total) by mouth 2 (two) times daily with a meal., Disp: 180 tablet, Rfl: 2   cyclobenzaprine (FLEXERIL) 10 MG tablet, Take 10 mg by mouth 3 (three) times daily as needed for muscle spasms., Disp: , Rfl:    Dulaglutide (TRULICITY) 3 IO/9.6EX SOPN, Inject 3 mg into the skin every Friday., Disp: , Rfl:    famotidine (PEPCID) 20 MG tablet, Take 20 mg by mouth daily., Disp: , Rfl:    gabapentin (NEURONTIN) 100 MG capsule, Take 2 capsules (200 mg total) by mouth at bedtime. (Patient taking differently: Take 100 mg by mouth at bedtime.), Disp: 60 capsule, Rfl: 2   Insulin Aspart FlexPen (NOVOLOG) 100 UNIT/ML, Inject 5 Units into the skin daily., Disp: , Rfl:    insulin glargine (LANTUS SOLOSTAR) 100 UNIT/ML Solostar Pen, Inject 40 Units into  the skin 2 (two) times daily., Disp: , Rfl:    levothyroxine (SYNTHROID) 200 MCG tablet, Take 200 mcg by mouth daily before breakfast., Disp: , Rfl:    meloxicam (MOBIC) 7.5 MG tablet, Take 7.5 mg by mouth daily as needed for pain., Disp: , Rfl:    sacubitril-valsartan (ENTRESTO) 49-51 MG, Take 1 tablet by mouth 2 (two) times daily., Disp: , Rfl:    sertraline (ZOLOFT) 100 MG tablet, Take 200 mg by mouth daily. , Disp: , Rfl:    Tamsulosin HCl (FLOMAX) 0.4 MG CAPS, Take 1 capsule (0.4 mg total) by mouth every morning., Disp: 30 capsule, Rfl: 0   BD PEN NEEDLE NANO 2ND GEN 32G X 4 MM MISC, , Disp: , Rfl:    Continuous Blood Gluc Receiver (FREESTYLE LIBRE 2 READER) DEVI, by Does not apply route., Disp: , Rfl:    Lancets (ONETOUCH DELICA PLUS [id number]) MISC, Apply topically., Disp: , Rfl:   Past Medical History: Past Medical History:  Diagnosis Date   Cancer ([place])    thyroid   CHF (congestive heart failure) (H[facility])    Complete heart block ([place])    a. 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 Burt Knack and Dr. Cyndia Bent   Severe aortic stenosis    Sleep apnea     Tobacco Use: Social History   Tobacco Use  Smoking Status Never  Smokeless Tobacco Never    Labs: Review Flowsheet       Latest Ref Rng & Units 07/19/2011 08/07/2021 08/25/2021 08/29/2021  Labs for ITP Cardiac and Pulmonary Rehab  Hemoglobin A1c 4.8 - 5.6 % 7.1  - 7.5  -  PH, Arterial 7.35 - 7.45 - 7.376  - -  PCO2 arterial 32 - 48 mmHg - 45.1  - -  Bicarbonate 20.0 - 28.0 mmol/L 20.0 - 28.0 mmol/L - 28.5  26.4  - -  TCO2 22 - 32 mmol/L - 30  28  -  23  26   O2 Saturation % % - 62  92  - -    Capillary Blood Glucose: Lab Results  Component Value Date   GLUCAP 196 (H) 08/30/2021   GLUCAP 108 (H) 08/30/2021   GLUCAP 166 (H) 08/29/2021   GLUCAP 116 (H) 08/29/2021   GLUCAP 140 (H) 08/29/2021     Exercise Target Goals: Exercise Program Goal: Individual exercise prescription set using results from initial 6 min walk test and THRR while considering  patient's activity barriers and safety.   Exercise Prescription Goal: Initial exercise prescription builds to 30-45 minutes a day of aerobic activity, 2-3 days per week.  Home exercise guidelines will be given to patient during program as part of exercise prescription that the participant will acknowledge.  Activity Barriers & Risk Stratification:  Activity Barriers & Cardiac Risk Stratification - 12/21/21 1236       Activity Barriers & Cardiac Risk Stratification   Activity Barriers Back Problems;Neck/Spine Problems;Joint Problems;Deconditioning;Muscular Weakness;Decreased Ventricular Function;Balance Concerns;Assistive Device   Voices using cane on uneven surfaces. Bilateral diabetic neuropathy in the feet.   Cardiac Risk Stratification High             6 Minute Walk:  6 Minute Walk     Row Name 12/21/21 1233         6 Minute Walk   Phase Initial     Distance 1342 feet     Walk Time 6 minutes     # of Rest Breaks 0     MPH 2.6     METS 2.68     RPE 11     Perceived Dyspnea  0     VO2 Peak 9.37     Symptoms Yes (comment)     Comments Aching in the hips, denies pain     Resting HR 66 bpm     Resting BP 134/70     Resting Oxygen Saturation  97 %     Exercise Oxygen Saturation  during 6 min walk 97 %     Max Ex. HR 97 bpm     Max Ex. BP 148/80     2 Minute Post BP 140/80              Oxygen Initial Assessment:   Oxygen Re-Evaluation:   Oxygen Discharge (Final Oxygen Re-Evaluation):   Initial Exercise Prescription:  Initial Exercise Prescription -  12/21/21 1200       Date of Initial Exercise RX and Referring Provider   Date 12/21/21  Referring Provider [name], MD    Expected Discharge Date 02/16/22      NuStep   Level 2    SPM 75    Minutes 15    METs 2.7      Arm Ergometer   Level 1.5    RPM 60    Minutes 15    METs 2.7      Prescription Details   Frequency (times per week) 3    Duration Progress to 30 minutes of continuous aerobic without signs/symptoms of physical distress      Intensity   THRR 40-80% of Max Heartrate 59-118    Ratings of Perceived Exertion 11-13    Perceived Dyspnea 0-4      Progression   Progression Continue progressive overload as per policy without signs/symptoms or physical distress.      Resistance Training   Training Prescription Yes    Weight 3 lbs    Reps 10-15             Perform Capillary Blood Glucose checks as needed.  Exercise Prescription Changes:   Exercise Comments:   Exercise Goals and Review:   Exercise Goals     Row Name 12/21/21 1238             Exercise Goals   Increase Physical Activity Yes       Intervention Provide advice, education, support and counseling about physical activity/exercise needs.;Develop an individualized exercise prescription for aerobic and resistive training based on initial evaluation findings, risk stratification, comorbidities and participant's personal goals.       Expected Outcomes Short Term: Attend rehab on a regular basis to increase amount of physical activity.;Long Term: Add in home exercise to make exercise part of routine and to increase amount of physical activity.;Long Term: Exercising regularly at least 3-5 days a week.       Increase Strength and Stamina Yes       Intervention Provide advice, education, support and counseling about physical activity/exercise needs.;Develop an individualized exercise prescription for aerobic and resistive training based on initial evaluation findings, risk stratification,  comorbidities and participant's personal goals.       Expected Outcomes Long Term: Improve cardiorespiratory fitness, muscular endurance and strength as measured by increased METs and functional capacity (6MWT);Short Term: Perform resistance training exercises routinely during rehab and add in resistance training at home;Short Term: Increase workloads from initial exercise prescription for resistance, speed, and METs.       Able to understand and use rate of perceived exertion (RPE) scale Yes       Intervention Provide education and explanation on how to use RPE scale       Expected Outcomes Short Term: Able to use RPE daily in rehab to express subjective intensity level;Long Term:  Able to use RPE to guide intensity level when exercising independently       Knowledge and understanding of Target Heart Rate Range (THRR) Yes       Intervention Provide education and explanation of THRR including how the numbers were predicted and where they are located for reference       Expected Outcomes Short Term: Able to state/look up THRR;Short Term: Able to use daily as guideline for intensity in rehab;Long Term: Able to use THRR to govern intensity when exercising independently       Understanding of Exercise Prescription Yes       Intervention Provide education, explanation, and written materials on patient's individual exercise prescription  Expected Outcomes Short Term: Able to explain program exercise prescription;Long Term: Able to explain home exercise prescription to exercise independently                Exercise Goals Re-Evaluation :   Discharge Exercise Prescription (Final Exercise Prescription Changes):   Nutrition:  Target Goals: Understanding of nutrition guidelines, daily intake of sodium '1500mg'$ , cholesterol '200mg'$ , calories 30% from fat and 7% or less from saturated fats, daily to have 5 or more servings of fruits and vegetables.  Biometrics:  Pre Biometrics - 12/21/21 1005        Pre Biometrics   Waist Circumference 48 inches    Hip Circumference 48 inches    Waist to Hip Ratio 1 %    Triceps Skinfold 9 mm    % Body Fat 31 %    Grip Strength 46 kg    Flexibility 0 in   Unable to reach   Single Leg Stand 2.25 seconds              Nutrition Therapy Plan and Nutrition Goals:   Nutrition Assessments:  MEDIFICTS Score Key: ?70 Need to make dietary changes  40-70 Heart Healthy Diet ? 40 Therapeutic Level Cholesterol Diet    Picture Your Plate Scores: <93 Unhealthy dietary pattern with much room for improvement. 41-50 Dietary pattern unlikely to meet recommendations for good health and room for improvement. 51-60 More healthful dietary pattern, with some room for improvement.  >60 Healthy dietary pattern, although there may be some specific behaviors that could be improved.    Nutrition Goals Re-Evaluation:   Nutrition Goals Re-Evaluation:   Nutrition Goals Discharge (Final Nutrition Goals Re-Evaluation):   Psychosocial: Target Goals: Acknowledge presence or absence of significant depression and/or stress, maximize coping skills, provide positive support system. Participant is able to verbalize types and ability to use techniques and skills needed for reducing stress and depression.  Initial Review & Psychosocial Screening:  Initial Psych Review & Screening - 12/21/21 1103       Initial Review   Current issues with History of Depression;Current Depression;Current Psychotropic Meds;Current Sleep Concerns      Family Dynamics   Good Support System? Yes   [name] lives alone. Linna Hoff has his 2 children for support who lives in the area. Linna Hoff also has his faith.   Comments Linna Hoff says he has been experiencing some depression since his good firend passed away last 03/22/23. Linna Hoff is currently receiving couselling with a psychologist which has been very helpful. Linna Hoff is taking an antidepressant which he has for years      Barriers   Psychosocial barriers to  participate in program The patient should benefit from training in stress management and relaxation.      Screening Interventions   Interventions Encouraged to exercise;To provide support and resources with identified psychosocial needs    Expected Outcomes Long Term Goal: Stressors or current issues are controlled or eliminated.;Short Term goal: Identification and review with participant of any Quality of Life or Depression concerns found by scoring the questionnaire.;Long Term goal: The participant improves quality of Life and PHQ9 Scores as seen by post scores and/or verbalization of changes             Quality of Life Scores:  Quality of Life - 12/21/21 1227       Quality of Life   Select Quality of Life      Quality of Life Scores   Health/Function Pre 17.3 %    Socioeconomic Pre  21.83 %    Psych/Spiritual Pre 22.21 %    Family Pre 25.5 %    GLOBAL Pre 20.25 %            Scores of 19 and below usually indicate a poorer quality of life in these areas.  A difference of  2-3 points is a clinically meaningful difference.  A difference of 2-3 points in the total score of the Quality of Life Index has been associated with significant improvement in overall quality of life, self-image, physical symptoms, and general health in studies assessing change in quality of life.  PHQ-9: Review Flowsheet       12/21/2021 12/21/2015  Depression screen PHQ 2/9  Decreased Interest 1 0  Down, Depressed, Hopeless 1 0  PHQ - 2 Score 2 0  Altered sleeping 1 -  Tired, decreased energy 1 -  Change in appetite 1 -  Feeling bad or failure about yourself  0 -  Trouble concentrating 0 -  Moving slowly or fidgety/restless 0 -  PHQ-9 Score 5 -  Difficult doing work/chores Somewhat difficult -   Interpretation of Total Score  Total Score Depression Severity:  1-4 = Minimal depression, 5-9 = Mild depression, 10-14 = Moderate depression, 15-19 = Moderately severe depression, 20-27 = Severe  depression   Psychosocial Evaluation and Intervention:   Psychosocial Re-Evaluation:   Psychosocial Discharge (Final Psychosocial Re-Evaluation):   Vocational Rehabilitation: Provide vocational rehab assistance to qualifying candidates.   Vocational Rehab Evaluation & Intervention:  Vocational Rehab - 12/21/21 1107       Initial Vocational Rehab Evaluation & Intervention   Assessment shows need for Vocational Rehabilitation No   Linna Hoff is retired and does not need vocatinal rehab at this time            Education: Education Goals: Education classes will be provided on a weekly basis, covering required topics. Participant will state understanding/return demonstration of topics presented.     Core Videos: Exercise    Move It!  Clinical staff conducted group or individual video education with verbal and written material and guidebook.  Patient learns the recommended Pritikin exercise program. Exercise with the goal of living a long, healthy life. Some of the health benefits of exercise include controlled diabetes, healthier blood pressure levels, improved cholesterol levels, improved heart and lung capacity, improved sleep, and better body composition. Everyone should speak with their doctor before starting or changing an exercise routine.  Biomechanical Limitations Clinical staff conducted group or individual video education with verbal and written material and guidebook.  Patient learns how biomechanical limitations can impact exercise and how we can mitigate and possibly overcome limitations to have an impactful and balanced exercise routine.  Body Composition Clinical staff conducted group or individual video education with verbal and written material and guidebook.  Patient learns that body composition (ratio of muscle mass to fat mass) is a key component to assessing overall fitness, rather than body weight alone. Increased fat mass, especially visceral belly fat, can put  [place] at increased risk for metabolic syndrome, type 2 diabetes, heart disease, and even death. It is recommended to combine diet and exercise (cardiovascular and resistance training) to improve your body composition. Seek guidance from your physician and exercise physiologist before implementing an exercise routine.  Exercise Action Plan Clinical staff conducted group or individual video education with verbal and written material and guidebook.  Patient learns the recommended strategies to achieve and enjoy long-term exercise adherence, including variety, self-motivation, self-efficacy, and positive  decision making. Benefits of exercise include fitness, good health, weight management, more energy, better sleep, less stress, and overall well-being.  Medical   Heart Disease Risk Reduction Clinical staff conducted group or individual video education with verbal and written material and guidebook.  Patient learns our heart is our most vital organ as it circulates oxygen, nutrients, white blood cells, and hormones throughout the entire body, and carries waste away. Data supports a plant-based eating plan like the Pritikin Program for its effectiveness in slowing progression of and reversing heart disease. The video provides a number of recommendations to address heart disease.   Metabolic Syndrome and Belly Fat  Clinical staff conducted group or individual video education with verbal and written material and guidebook.  Patient learns what metabolic syndrome is, how it leads to heart disease, and how one can reverse it and keep it from coming back. You have metabolic syndrome if you have 3 of the following 5 criteria: abdominal obesity, high blood pressure, high triglycerides, low HDL cholesterol, and high blood sugar.  Hypertension and Heart Disease Clinical staff conducted group or individual video education with verbal and written material and guidebook.  Patient learns that high blood pressure, or  hypertension, is very common in the [place]. Hypertension is largely due to excessive salt intake, but other important risk factors include being overweight, physical inactivity, drinking too much alcohol, smoking, and not eating enough potassium from fruits and vegetables. High blood pressure is a leading risk factor for heart attack, stroke, congestive heart failure, dementia, kidney failure, and premature death. Long-term effects of excessive salt intake include stiffening of the arteries and thickening of heart muscle and organ damage. Recommendations include ways to reduce hypertension and the risk of heart disease.  Diseases of Our Time - Focusing on Diabetes Clinical staff conducted group or individual video education with verbal and written material and guidebook.  Patient learns why the best way to stop diseases of our time is prevention, through food and other lifestyle changes. Medicine (such as prescription pills and surgeries) is often only a Band-Aid on the problem, not a long-term solution. Most common diseases of our time include obesity, type 2 diabetes, hypertension, heart disease, and cancer. The Pritikin Program is recommended and has been proven to help reduce, reverse, and/or prevent the damaging effects of metabolic syndrome.  Nutrition   Overview of the Pritikin Eating Plan  Clinical staff conducted group or individual video education with verbal and written material and guidebook.  Patient learns about the [place] for disease risk reduction. The [place] emphasizes a wide variety of unrefined, minimally-processed carbohydrates, like fruits, vegetables, whole grains, and legumes. Go, Caution, and Stop food choices are explained. Plant-based and lean animal proteins are emphasized. Rationale provided for low sodium intake for blood pressure control, low added sugars for blood sugar stabilization, and low added fats and oils for coronary artery disease  risk reduction and weight management.  Calorie Density  Clinical staff conducted group or individual video education with verbal and written material and guidebook.  Patient learns about calorie density and how it impacts the Pritikin Eating Plan. Knowing the characteristics of the food you choose will help you decide whether those foods will lead to weight gain or weight loss, and whether you want to consume more or less of them. Weight loss is usually a side effect of the Pritikin Eating Plan because of its focus on low calorie-dense foods.  Label Reading  Clinical staff conducted group  or individual video education with verbal and written material and guidebook.  Patient learns about the Pritikin recommended label reading guidelines and corresponding recommendations regarding calorie density, added sugars, sodium content, and whole grains.  Dining Out - Part 1  Clinical staff conducted group or individual video education with verbal and written material and guidebook.  Patient learns that restaurant meals can be sabotaging because they can be so high in calories, fat, sodium, and/or sugar. Patient learns recommended strategies on how to positively address this and avoid unhealthy pitfalls.  Facts on Fats  Clinical staff conducted group or individual video education with verbal and written material and guidebook.  Patient learns that lifestyle modifications can be just as effective, if not more so, as many medications for lowering your risk of heart disease. A Pritikin lifestyle can help to reduce your risk of inflammation and atherosclerosis (cholesterol build-up, or plaque, in the artery walls). Lifestyle interventions such as dietary choices and physical activity address the cause of atherosclerosis. A review of the types of fats and their impact on blood cholesterol levels, along with dietary recommendations to reduce fat intake is also included.  Nutrition Action Plan  Clinical staff  conducted group or individual video education with verbal and written material and guidebook.  Patient learns how to incorporate Pritikin recommendations into their lifestyle. Recommendations include planning and keeping personal health goals in mind as an important part of their success.  Healthy Mind-Set    Healthy Minds, Bodies, Hearts  Clinical staff conducted group or individual video education with verbal and written material and guidebook.  Patient learns how to identify when they are stressed. Video will discuss the impact of that stress, as well as the many benefits of stress management. Patient will also be introduced to stress management techniques. The way we think, act, and feel has an impact on our hearts.  How Our Thoughts Can Heal Our Hearts  Clinical staff conducted group or individual video education with verbal and written material and guidebook.  Patient learns that negative thoughts can cause depression and anxiety. This can result in negative lifestyle behavior and serious health problems. Cognitive behavioral therapy is an effective method to help control our thoughts in order to change and improve our emotional outlook.  Additional Videos:  Exercise    Improving Performance  Clinical staff conducted group or individual video education with verbal and written material and guidebook.  Patient learns to use a non-linear approach by alternating intensity levels and lengths of time spent exercising to help burn more calories and lose more body fat. Cardiovascular exercise helps improve heart health, metabolism, hormonal balance, blood sugar control, and recovery from fatigue. Resistance training improves strength, endurance, balance, coordination, reaction time, metabolism, and muscle mass. Flexibility exercise improves circulation, posture, and balance. Seek guidance from your physician and exercise physiologist before implementing an exercise routine and learn your capabilities  and proper form for all exercise.  Introduction to Yoga  Clinical staff conducted group or individual video education with verbal and written material and guidebook.  Patient learns about yoga, a discipline of the coming together of mind, breath, and body. The benefits of yoga include improved flexibility, improved range of motion, better posture and core strength, increased lung function, weight loss, and positive self-image. Yoga's heart health benefits include lowered blood pressure, healthier heart rate, decreased cholesterol and triglyceride levels, improved immune function, and reduced stress. Seek guidance from your physician and exercise physiologist before implementing an exercise routine and learn your capabilities  and proper form for all exercise.  Medical   Aging: Enhancing Your Quality of Life  Clinical staff conducted group or individual video education with verbal and written material and guidebook.  Patient learns key strategies and recommendations to stay in good physical health and enhance quality of life, such as prevention strategies, having an advocate, securing a [place], and keeping a list of medications and system for tracking them. It also discusses how to avoid risk for bone loss.  Biology of Weight Control  Clinical staff conducted group or individual video education with verbal and written material and guidebook.  Patient learns that weight gain occurs because we consume more calories than we burn (eating more, moving less). Even if your body weight is normal, you may have higher ratios of fat compared to muscle mass. Too much body fat puts you at increased risk for cardiovascular disease, heart attack, stroke, type 2 diabetes, and obesity-related cancers. In addition to exercise, following the [place] can help reduce your risk.  Decoding Lab Results  Clinical staff conducted group or individual video education with verbal and  written material and guidebook.  Patient learns that lab test reflects one measurement whose values change over time and are influenced by many factors, including medication, stress, sleep, exercise, food, hydration, pre-existing medical conditions, and more. It is recommended to use the knowledge from this video to become more involved with your lab results and evaluate your numbers to speak with your doctor.   Diseases of Our Time - Overview  Clinical staff conducted group or individual video education with verbal and written material and guidebook.  Patient learns that according to the CDC, 50% to 70% of chronic diseases (such as obesity, type 2 diabetes, elevated lipids, hypertension, and heart disease) are avoidable through lifestyle improvements including healthier food choices, listening to satiety cues, and increased physical activity.  Sleep Disorders Clinical staff conducted group or individual video education with verbal and written material and guidebook.  Patient learns how good quality and duration of sleep are important to overall health and well-being. Patient also learns about sleep disorders and how they impact health along with recommendations to address them, including discussing with a physician.  Nutrition  Dining Out - Part 2 Clinical staff conducted group or individual video education with verbal and written material and guidebook.  Patient learns how to plan ahead and communicate in order to maximize their dining experience in a healthy and nutritious manner. Included are recommended food choices based on the type of restaurant the patient is visiting.   Fueling a Best boy conducted group or individual video education with verbal and written material and guidebook.  There is a strong connection between our food choices and our health. Diseases like obesity and type 2 diabetes are very prevalent and are in large-part due to lifestyle choices. The  Pritikin Eating Plan provides plenty of food and hunger-curbing satisfaction. It is easy to follow, affordable, and helps reduce health risks.  Menu Workshop  Clinical staff conducted group or individual video education with verbal and written material and guidebook.  Patient learns that restaurant meals can sabotage health goals because they are often packed with calories, fat, sodium, and sugar. Recommendations include strategies to plan ahead and to communicate with the manager, chef, or server to help order a healthier meal.  Planning Your Eating Strategy  Clinical staff conducted group or individual video education with verbal and written material  and guidebook.  Patient learns about the [place] and its benefit of reducing the risk of disease. The [place] does not focus on calories. Instead, it emphasizes high-quality, nutrient-rich foods. By knowing the characteristics of the foods, we choose, we can determine their calorie density and make informed decisions.  Targeting Your Nutrition Priorities  Clinical staff conducted group or individual video education with verbal and written material and guidebook.  Patient learns that lifestyle habits have a tremendous impact on disease risk and progression. This video provides eating and physical activity recommendations based on your personal health goals, such as reducing LDL cholesterol, losing weight, preventing or controlling type 2 diabetes, and reducing high blood pressure.  Vitamins and Minerals  Clinical staff conducted group or individual video education with verbal and written material and guidebook.  Patient learns different ways to obtain key vitamins and minerals, including through a recommended healthy diet. It is important to discuss all supplements you take with your doctor.   Healthy Mind-Set    Smoking Cessation  Clinical staff conducted group or individual video education with verbal and written  material and guidebook.  Patient learns that cigarette smoking and tobacco addiction pose a serious health risk which affects millions of people. Stopping smoking will significantly reduce the risk of heart disease, lung disease, and many forms of cancer. Recommended strategies for quitting are covered, including working with your doctor to develop a successful plan.  Culinary   Becoming a Financial trader conducted group or individual video education with verbal and written material and guidebook.  Patient learns that cooking at home can be healthy, cost-effective, quick, and puts them in control. Keys to cooking healthy recipes will include looking at your recipe, assessing your equipment needs, planning ahead, making it simple, choosing cost-effective seasonal ingredients, and limiting the use of added fats, salts, and sugars.  Cooking - Breakfast and Snacks  Clinical staff conducted group or individual video education with verbal and written material and guidebook.  Patient learns how important breakfast is to satiety and nutrition through the entire day. Recommendations include key foods to eat during breakfast to help stabilize blood sugar levels and to prevent overeating at meals later in the day. Planning ahead is also a key component.  Cooking - Human resources officer conducted group or individual video education with verbal and written material and guidebook.  Patient learns eating strategies to improve overall health, including an approach to cook more at home. Recommendations include thinking of animal protein as a side on your plate rather than center stage and focusing instead on lower calorie dense options like vegetables, fruits, whole grains, and plant-based proteins, such as beans. Making sauces in large quantities to freeze for later and leaving the skin on your vegetables are also recommended to maximize your experience.  Cooking - Healthy Salads and  Dressing Clinical staff conducted group or individual video education with verbal and written material and guidebook.  Patient learns that vegetables, fruits, whole grains, and legumes are the foundations of the [place]. Recommendations include how to incorporate each of these in flavorful and healthy salads, and how to create homemade salad dressings. Proper handling of ingredients is also covered. Cooking - Soups and [employer] - Soups and Desserts Clinical staff conducted group or individual video education with verbal and written material and guidebook.  Patient learns that Pritikin soups and desserts make for easy, nutritious, and delicious snacks and meal components  that are low in sodium, fat, sugar, and calorie density, while high in vitamins, minerals, and filling fiber. Recommendations include simple and healthy ideas for soups and desserts.   Overview     The Pritikin Solution Program Overview Clinical staff conducted group or individual video education with verbal and written material and guidebook.  Patient learns that the results of the [place] Program have been documented in more than 100 articles published in peer-reviewed journals, and the benefits include reducing risk factors for (and, in some cases, even reversing) high cholesterol, high blood pressure, type 2 diabetes, obesity, and more! An overview of the three key pillars of the Pritikin Program will be covered: eating well, doing regular exercise, and having a healthy mind-set.  WORKSHOPS  Exercise: Exercise Basics: Building Your Action Plan Clinical staff led group instruction and group discussion with PowerPoint presentation and patient guidebook. To enhance the learning environment the use of posters, models and videos may be added. At the conclusion of this workshop, patients will comprehend the difference between physical activity and exercise, as well as the benefits of incorporating both, into  their routine. Patients will understand the FITT (Frequency, Intensity, Time, and Type) principle and how to use it to build an exercise action plan. In addition, safety concerns and other considerations for exercise and cardiac rehab will be addressed by the presenter. The purpose of this lesson is to promote a comprehensive and effective weekly exercise routine in order to improve patients' overall level of fitness.   Managing Heart Disease: Your Path to a Healthier Heart Clinical staff led group instruction and group discussion with PowerPoint presentation and patient guidebook. To enhance the learning environment the use of posters, models and videos may be added.At the conclusion of this workshop, patients will understand the anatomy and physiology of the heart. Additionally, they will understand how Pritikin's three pillars impact the risk factors, the progression, and the management of heart disease.  The purpose of this lesson is to provide a high-level overview of the heart, heart disease, and how the Pritikin lifestyle positively impacts risk factors.  Exercise Biomechanics Clinical staff led group instruction and group discussion with PowerPoint presentation and patient guidebook. To enhance the learning environment the use of posters, models and videos may be added. Patients will learn how the structural parts of their bodies function and how these functions impact their daily activities, movement, and exercise. Patients will learn how to promote a neutral spine, learn how to manage pain, and identify ways to improve their physical movement in order to promote healthy living. The purpose of this lesson is to expose patients to common physical limitations that impact physical activity. Participants will learn practical ways to adapt and manage aches and pains, and to minimize their effect on regular exercise. Patients will learn how to maintain good posture while sitting, walking, and  lifting.  Balance Training and Fall Prevention  Clinical staff led group instruction and group discussion with PowerPoint presentation and patient guidebook. To enhance the learning environment the use of posters, models and videos may be added. At the conclusion of this workshop, patients will understand the importance of their sensorimotor skills (vision, proprioception, and the vestibular system) in maintaining their ability to balance as they age. Patients will apply a variety of balancing exercises that are appropriate for their current level of function. Patients will understand the common causes for poor balance, possible solutions to these problems, and ways to modify their physical environment in order to minimize their  fall risk. The purpose of this lesson is to teach patients about the importance of maintaining balance as they age and ways to minimize their risk of falling.  WORKSHOPS   Nutrition:  Fueling a Scientist, research (physical sciences) led group instruction and group discussion with PowerPoint presentation and patient guidebook. To enhance the learning environment the use of posters, models and videos may be added. Patients will review the foundational principles of the [place] and understand what constitutes a serving size in each of the food groups. Patients will also learn Pritikin-friendly foods that are better choices when away from home and review make-ahead meal and snack options. Calorie density will be reviewed and applied to three nutrition priorities: weight maintenance, weight loss, and weight gain. The purpose of this lesson is to reinforce (in a group setting) the key concepts around what patients are recommended to eat and how to apply these guidelines when away from home by planning and selecting Pritikin-friendly options. Patients will understand how calorie density may be adjusted for different weight management goals.  Mindful Eating  Clinical staff led  group instruction and group discussion with PowerPoint presentation and patient guidebook. To enhance the learning environment the use of posters, models and videos may be added. Patients will briefly review the concepts of the [place] and the importance of low-calorie dense foods. The concept of mindful eating will be introduced as well as the importance of paying attention to internal hunger signals. Triggers for non-hunger eating and techniques for dealing with triggers will be explored. The purpose of this lesson is to provide patients with the opportunity to review the basic principles of the [place], discuss the value of eating mindfully and how to measure internal cues of hunger and fullness using the Hunger Scale. Patients will also discuss reasons for non-hunger eating and learn strategies to use for controlling emotional eating.  Targeting Your Nutrition Priorities Clinical staff led group instruction and group discussion with PowerPoint presentation and patient guidebook. To enhance the learning environment the use of posters, models and videos may be added. Patients will learn how to determine their genetic susceptibility to disease by reviewing their family history. Patients will gain insight into the importance of diet as part of an overall healthy lifestyle in mitigating the impact of genetics and other environmental insults. The purpose of this lesson is to provide patients with the opportunity to assess their personal nutrition priorities by looking at their family history, their own health history and current risk factors. Patients will also be able to discuss ways of prioritizing and modifying the [place] for their highest risk areas  Menu  Clinical staff led group instruction and group discussion with PowerPoint presentation and patient guidebook. To enhance the learning environment the use of posters, models and videos may be added. Using menus  brought in from [employer], or printed from [employer], patients will apply the [place] dining out guidelines that were presented in the [employer] video. Patients will also be able to practice these guidelines in a variety of provided scenarios. The purpose of this lesson is to provide patients with the opportunity to practice hands-on learning of the [place] with actual menus and practice scenarios.  Label Reading Clinical staff led group instruction and group discussion with PowerPoint presentation and patient guidebook. To enhance the learning environment the use of posters, models and videos may be added. Patients will review and discuss the Pritikin  label reading guidelines presented in Pritikin's Label Reading Educational series video. Using fool labels brought in from local grocery stores and markets, patients will apply the label reading guidelines and determine if the packaged food meet the Pritikin guidelines. The purpose of this lesson is to provide patients with the opportunity to review, discuss, and practice hands-on learning of the Pritikin Label Reading guidelines with actual packaged food labels. [place] Workshops are designed to teach patients ways to prepare quick, simple, and affordable recipes at home. The importance of nutrition's role in chronic disease risk reduction is reflected in its emphasis in the overall Pritikin program. By learning how to prepare essential core Pritikin Eating Plan recipes, patients will increase control over what they eat; be able to customize the flavor of foods without the use of added salt, sugar, or fat; and improve the quality of the food they consume. By learning a set of core recipes which are easily assembled, quickly prepared, and affordable, patients are more likely to prepare more healthy foods at home. These workshops focus on convenient breakfasts, simple  entres, side dishes, and desserts which can be prepared with minimal effort and are consistent with nutrition recommendations for cardiovascular risk reduction. Cooking [employer] are taught by a Engineer, materials (RD) who has been trained by the [employer]. The chef or RD has a clear understanding of the importance of minimizing - if not completely eliminating - added fat, sugar, and sodium in recipes. Throughout the series of [place] Workshop sessions, patients will learn about healthy ingredients and efficient methods of cooking to build confidence in their capability to prepare    Cooking School weekly topics:  Adding Flavor- Sodium-Free  Fast and Healthy Breakfasts  Powerhouse Plant-Based Proteins  Satisfying Salads and Dressings  Simple Sides and Sauces  International Cuisine-Spotlight on the [place] Zones  Delicious Desserts  Savory Soups  Efficiency Cooking - Meals in a Snap  Tasty Appetizers and Snacks  Comforting Weekend Breakfasts  One-Pot Wonders   Fast Evening Meals  Easy Sims (Psychosocial): New Thoughts, New Behaviors Clinical staff led group instruction and group discussion with PowerPoint presentation and patient guidebook. To enhance the learning environment the use of posters, models and videos may be added. Patients will learn and practice techniques for developing effective health and lifestyle goals. Patients will be able to effectively apply the goal setting process learned to develop at least one new personal goal.  The purpose of this lesson is to expose patients to a new skill set of behavior modification techniques such as techniques setting SMART goals, overcoming barriers, and achieving new thoughts and new behaviors.  Managing Moods and Relationships Clinical staff led group instruction and group discussion with PowerPoint presentation and patient  guidebook. To enhance the learning environment the use of posters, models and videos may be added. Patients will learn how emotional and chronic stress factors can impact their health and relationships. They will learn healthy ways to manage their moods and utilize positive coping mechanisms. In addition, ICR patients will learn ways to improve communication skills. The purpose of this lesson is to expose patients to ways of understanding how one's mood and health are intimately connected. Developing a healthy outlook can help build positive relationships and connections with others. Patients will understand the importance of utilizing effective communication skills that include actively listening and being heard. They will learn and  understand the importance of the "4 Cs" and especially Connections in fostering of a Healthy Mind-Set.  Healthy Sleep for a Healthy Heart Clinical staff led group instruction and group discussion with PowerPoint presentation and patient guidebook. To enhance the learning environment the use of posters, models and videos may be added. At the conclusion of this workshop, patients will be able to demonstrate knowledge of the importance of sleep to overall health, well-being, and quality of life. They will understand the symptoms of, and treatments for, common sleep disorders. Patients will also be able to identify daytime and nighttime behaviors which impact sleep, and they will be able to apply these tools to help manage sleep-related challenges. The purpose of this lesson is to provide patients with a general overview of sleep and outline the importance of quality sleep. Patients will learn about a few of the most common sleep disorders. Patients will also be introduced to the concept of "sleep hygiene," and discover ways to self-manage certain sleeping problems through simple daily behavior changes. Finally, the workshop will motivate patients by clarifying the links between quality  sleep and their goals of heart-healthy living.   Recognizing and Reducing Stress Clinical staff led group instruction and group discussion with PowerPoint presentation and patient guidebook. To enhance the learning environment the use of posters, models and videos may be added. At the conclusion of this workshop, patients will be able to understand the types of stress reactions, differentiate between acute and chronic stress, and recognize the impact that chronic stress has on their health. They will also be able to apply different coping mechanisms, such as reframing negative self-talk. Patients will have the opportunity to practice a variety of stress management techniques, such as deep abdominal breathing, progressive muscle relaxation, and/or guided imagery.  The purpose of this lesson is to educate patients on the role of stress in their lives and to provide healthy techniques for coping with it.  Learning Barriers/Preferences:  Learning Barriers/Preferences - 12/21/21 1229       Learning Barriers/Preferences   Learning Barriers Sight   wears reading glasses   Learning Preferences Skilled Demonstration;Individual Instruction;Group Instruction             Education Topics:  Knowledge Questionnaire Score:  Knowledge Questionnaire Score - 12/21/21 1229       Knowledge Questionnaire Score   Pre Score 22/24             Core Components/Risk Factors/Patient Goals at Admission:  Personal Goals and Risk Factors at Admission - 12/21/21 1231       Core Components/Risk Factors/Patient Goals on Admission    Weight Management Yes;Obesity;Weight Loss    Intervention Weight Management: Develop a combined nutrition and exercise program designed to reach desired caloric intake, while maintaining appropriate intake of nutrient and fiber, sodium and fats, and appropriate energy expenditure required for the weight goal.;Weight Management: Provide education and appropriate resources to help  participant work on and attain dietary goals.;Weight Management/Obesity: Establish reasonable short term and long term weight goals.;Obesity: Provide education and appropriate resources to help participant work on and attain dietary goals.    Admit Weight 238 lb 15.7 oz (108.4 kg)    Expected Outcomes Short Term: Continue to assess and modify interventions until short term weight is achieved;Long Term: Adherence to nutrition and physical activity/exercise program aimed toward attainment of established weight goal;Understanding recommendations for meals to include 15-35% energy as protein, 25-35% energy from fat, 35-60% energy from carbohydrates, less than '200mg'$  of dietary cholesterol,  20-35 gm of total fiber daily;Understanding of distribution of calorie intake throughout the day with the consumption of 4-5 meals/snacks    Diabetes Yes    Intervention Provide education about signs/symptoms and action to take for hypo/hyperglycemia.;Provide education about proper nutrition, including hydration, and aerobic/resistive exercise prescription along with prescribed medications to achieve blood glucose in normal ranges: Fasting glucose 65-99 mg/dL    Expected Outcomes Short Term: Participant verbalizes understanding of the signs/symptoms and immediate care of hyper/hypoglycemia, proper foot care and importance of medication, aerobic/resistive exercise and nutrition plan for blood glucose control.;Long Term: Attainment of HbA1C < 7%.    Heart Failure Yes    Intervention Provide a combined exercise and nutrition program that is supplemented with education, support and counseling about heart failure. Directed toward relieving symptoms such as shortness of breath, decreased exercise tolerance, and extremity edema.    Expected Outcomes Short term: Attendance in program 2-3 days a week with increased exercise capacity. Reported lower sodium intake. Reported increased fruit and vegetable intake. Reports medication  compliance.;Short term: Daily weights obtained and reported for increase. Utilizing diuretic protocols set by physician.;Long term: Adoption of self-care skills and reduction of barriers for early signs and symptoms recognition and intervention leading to self-care maintenance.    Hypertension Yes    Intervention Provide education on lifestyle modifcations including regular physical activity/exercise, weight management, moderate sodium restriction and increased consumption of fresh fruit, vegetables, and low fat dairy, alcohol moderation, and smoking cessation.;Monitor prescription use compliance.    Expected Outcomes Short Term: Continued assessment and intervention until BP is < 140/58m HG in hypertensive participants. < 130/864mHG in hypertensive participants with diabetes, heart failure or chronic kidney disease.;Long Term: Maintenance of blood pressure at goal levels.    Lipids Yes    Intervention Provide education and support for participant on nutrition & aerobic/resistive exercise along with prescribed medications to achieve LDL '70mg'$ , HDL >'40mg'$ .    Expected Outcomes Short Term: Participant states understanding of desired cholesterol values and is compliant with medications prescribed. Participant is following exercise prescription and nutrition guidelines.;Long Term: Cholesterol controlled with medications as prescribed, with individualized exercise RX and with personalized nutrition plan. Value goals: LDL < '70mg'$ , HDL > 40 mg.             Core Components/Risk Factors/Patient Goals Review:    Core Components/Risk Factors/Patient Goals at Discharge (Final Review):    ITP Comments:  ITP Comments     Row Name 12/21/21 1052           ITP Comments Dr TrFransico HimD, Medical Director. Introduction to [facility]/ Intensive Cardiac Rehab. Inital Orientation Packet reviewed with the patient                Comments: Participant attended orientation for the cardiac  rehabilitation program on  12/21/2021  to perform initial intake and exercise walk test. Patient introduced to the [facility] and orientation packet was reviewed. Completed 6-minute walk test, measurements, initial ITP, and exercise prescription. Vital signs stable. Telemetry-V paced, atrial sensed, mildly symptomatic. DaLinna Hoffeported an aching in both hips but denied pain.MaHarrell GaveN BSN   Service time was from 1000 to 1153.

## 2021-12-21 NOTE — Progress Notes (Signed)
Cardiac Rehab Medication Review by a Nurse  Does the patient  feel that his/her medications are working for him/her?  yes  Has the patient been experiencing any side effects to the medications prescribed?  no  Does the patient measure his/her own blood pressure or blood glucose at home?  yes   Does the patient have any problems obtaining medications due to transportation or finances?   no  Understanding of regimen: excellent Understanding of indications: excellent Potential of compliance: excellent    Nurse comments: Tony [name] is taking his medications as prescribed and has a good understanding of what his medications are for. Tony [name] has a CGM and checks his blood pressure once a week.    Christa See [facility] RN 12/21/2021 11:12 AM

## 2021-12-25 ENCOUNTER — Encounter (HOSPITAL_COMMUNITY)
Admission: RE | Admit: 2021-12-25 | Discharge: 2021-12-25 | Disposition: A | Discharge status: Home / Self Care | Payer: Medicare PPO | Source: Ambulatory Visit | Attending: Cardiovascular Disease | Admitting: Cardiovascular Disease

## 2021-12-25 DIAGNOSIS — Z952 Presence of prosthetic heart valve: Secondary | ICD-10-CM

## 2021-12-25 LAB — GLUCOSE, CAPILLARY
Glucose-Capillary: 206 mg/dL — ABNORMAL HIGH (ref 70–99)
Glucose-Capillary: 208 mg/dL — ABNORMAL HIGH (ref 70–99)

## 2021-12-25 NOTE — Progress Notes (Signed)
Daily Session Note  Patient Details  Name: IZAYAH MINER MRN: [id number] Date of Birth: 1950/08/27 Referring Provider:   Flowsheet Row INTENSIVE CARDIAC REHAB ORIENT from 12/21/2021 in [place]  Referring Provider [name], MD       Encounter Date: 12/25/2021  Check In:  Session Check In - 12/25/21 1103       Check-In   Supervising physician immediately available to respond to emergencies [facility] immediately available    Physician(s) Dr Florene Glen    Location [facility]    Staff Present Barnet Pall, RN, Milus Glazier, MS, ACSM-CEP, CCRP, Exercise Physiologist;Jetta Gilford Rile BS, ACSM-CEP, Exercise Physiologist;Olinty Celesta Aver, MS, ACSM-CEP, Exercise Physiologist;Carlette Wilber Oliphant, RN, BSN    Virtual Visit No    Medication changes reported     No    Fall or balance concerns reported    No    Tobacco Cessation No Change    Current number of cigarettes/nicotine per day     0    Warm-up and Cool-down Performed as group-led instruction    Resistance Training Performed Yes    VAD Patient? No    PAD/SET Patient? No      Pain Assessment   Currently in Pain? No/denies    Pain Score 0-No pain    Multiple Pain Sites No             Capillary Blood Glucose: Results for orders placed or performed during the hospital encounter of 12/25/21 (from the past 24 hour(s))  Glucose, capillary     Status: Abnormal   Collection Time: 12/25/21 11:26 AM  Result Value Ref Range   Glucose-Capillary 208 (H) 70 - 99 mg/dL     Exercise Prescription Changes - 12/25/21 1030       Response to Exercise   Blood Pressure (Admit) 118/72    Blood Pressure (Exercise) 160/77    Blood Pressure (Exit) 132/74    Heart Rate (Admit) 66 bpm    Heart Rate (Exercise) 84 bpm    Heart Rate (Exit) 55 bpm    Rating of Perceived Exertion (Exercise) 12    Symptoms None    Comments Off to a good start with exercise.    Duration Continue with 30  min of aerobic exercise without signs/symptoms of physical distress.    Intensity THRR unchanged      Progression   Progression Continue to progress workloads to maintain intensity without signs/symptoms of physical distress.    Average METs 1.8      Resistance Training   Training Prescription Yes    Weight 3 lbs    Reps 10-15    Time 10 Minutes      Interval Training   Interval Training No      NuStep   Level 2    SPM 81    Minutes 15    METs 1.9      Arm Ergometer   Level 1.5    Watts 7    Minutes 15    METs 1.7             Social History   Tobacco Use  Smoking Status Never  Smokeless Tobacco Never    Goals Met:  Exercise tolerated well No report of concerns or symptoms today Strength training completed today  Goals Unmet:  Not Applicable  Comments: Linna Hoff  started cardiac rehab today.  Pt tolerated light exercise without difficulty. VSS, telemetry-V paced , asymptomatic.  Medication list reconciled. Pt denies  barriers to medicaiton compliance.  PSYCHOSOCIAL ASSESSMENT:  PHQ-5. Pt exhibits positive coping skills, hopeful outlook with supportive family. Linna Hoff admits to being depressed. Danis currently receiving counseling. Linna Hoff is currently taking an antidepressant which is controlling his depression    Pt enjoys Pharmacist, hospital.   Pt oriented to exercise equipment and routine.    Understanding verbalized. Harrell Gave RN BSN    Dr. Fransico Him is Medical Director for Cardiac Rehab at [facility].

## 2021-12-27 ENCOUNTER — Encounter (HOSPITAL_COMMUNITY)
Admission: RE | Admit: 2021-12-27 | Discharge: 2021-12-27 | Disposition: A | Discharge status: Home / Self Care | Payer: Medicare PPO | Source: Ambulatory Visit | Attending: Cardiovascular Disease | Admitting: Cardiovascular Disease

## 2021-12-27 DIAGNOSIS — Z952 Presence of prosthetic heart valve: Secondary | ICD-10-CM

## 2021-12-27 LAB — GLUCOSE, CAPILLARY: Glucose-Capillary: 204 mg/dL — ABNORMAL HIGH (ref 70–99)

## 2021-12-27 NOTE — Progress Notes (Signed)
QUALITY OF LIFE SCORE REVIEW  [name] completed Quality of Life survey as a participant in Cardiac Rehab.  Scores 21.0 or below are considered low.  Pt score very low in several areas Overall 20.25, Health and Function 17.30, socioeconomic 21.83, physiological and spiritual 22.21, family 25.5. Patient quality of life slightly altered by physical constraints which limits ability to perform as prior to recent cardiac illness. Tony [name] reports being somewhat dissatisfied with his health due to his deconditioning. 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  Offered emotional support and reassurance. [name]'s wife passed away in 2018-08-11. Tony [name] is currently receiving counseling which has been helpful for him. Will continue to monitor and intervene as necessary.  Barnet Pall, RN,BSN 12/27/2021 11:29 AM

## 2021-12-29 ENCOUNTER — Encounter (HOSPITAL_COMMUNITY)
Admission: RE | Admit: 2021-12-29 | Discharge: 2021-12-29 | Disposition: A | Discharge status: Home / Self Care | Payer: Medicare PPO | Source: Ambulatory Visit | Attending: Cardiovascular Disease | Admitting: Cardiovascular Disease

## 2021-12-29 DIAGNOSIS — Z952 Presence of prosthetic heart valve: Secondary | ICD-10-CM | POA: Insufficient documentation

## 2021-12-29 DIAGNOSIS — Z48812 Encounter for surgical aftercare following surgery on the circulatory system: Secondary | ICD-10-CM | POA: Insufficient documentation

## 2021-12-29 NOTE — Progress Notes (Signed)
Incomplete Session Note  Patient Details  Name: Tony [name] MRN: [id number] Date of Birth: 11-21-1950 Referring Provider:   Flowsheet Row INTENSIVE CARDIAC REHAB ORIENT from 12/21/2021 in [place]  Referring Provider [name], MD       [name] did not complete his rehab session.  CBG 337 by [name]'s CGM no exercise due to elevated CBG per protocol. Linna Hoff said that he just ate lunch and will take his sliding scale insulin and will notify his endocrinologist Dr Buddy Duty. [name] plans to return to exercise next Wednesday. Patient asymptomatic.Harrell Gave RN BSN

## 2022-01-03 ENCOUNTER — Encounter (HOSPITAL_COMMUNITY)
Admission: RE | Admit: 2022-01-03 | Discharge: 2022-01-03 | Disposition: A | Discharge status: Home / Self Care | Payer: Medicare PPO | Source: Ambulatory Visit | Attending: Cardiovascular Disease | Admitting: Cardiovascular Disease

## 2022-01-03 DIAGNOSIS — Z952 Presence of prosthetic heart valve: Secondary | ICD-10-CM

## 2022-01-03 DIAGNOSIS — Z48812 Encounter for surgical aftercare following surgery on the circulatory system: Secondary | ICD-10-CM | POA: Diagnosis not present

## 2022-01-03 LAB — GLUCOSE, CAPILLARY: Glucose-Capillary: 177 mg/dL — ABNORMAL HIGH (ref 70–99)

## 2022-01-05 ENCOUNTER — Telehealth (HOSPITAL_COMMUNITY): Payer: Self-pay | Admitting: Geriatric Medicine

## 2022-01-05 ENCOUNTER — Encounter (HOSPITAL_COMMUNITY): Payer: Medicare PPO

## 2022-01-08 ENCOUNTER — Encounter (HOSPITAL_COMMUNITY)
Admission: RE | Admit: 2022-01-08 | Discharge: 2022-01-08 | Disposition: A | Discharge status: Home / Self Care | Payer: Medicare PPO | Source: Ambulatory Visit | Attending: Cardiovascular Disease | Admitting: Cardiovascular Disease

## 2022-01-08 DIAGNOSIS — Z952 Presence of prosthetic heart valve: Secondary | ICD-10-CM | POA: Diagnosis not present

## 2022-01-08 DIAGNOSIS — Z48812 Encounter for surgical aftercare following surgery on the circulatory system: Secondary | ICD-10-CM | POA: Diagnosis not present

## 2022-01-08 LAB — GLUCOSE, CAPILLARY: Glucose-Capillary: 174 mg/dL — ABNORMAL HIGH (ref 70–99)

## 2022-01-10 ENCOUNTER — Encounter (HOSPITAL_COMMUNITY)
Admission: RE | Admit: 2022-01-10 | Discharge: 2022-01-10 | Disposition: A | Discharge status: Home / Self Care | Payer: Medicare PPO | Source: Ambulatory Visit | Attending: Cardiovascular Disease | Admitting: Cardiovascular Disease

## 2022-01-10 ENCOUNTER — Telehealth: Payer: Self-pay

## 2022-01-10 DIAGNOSIS — Z952 Presence of prosthetic heart valve: Secondary | ICD-10-CM | POA: Diagnosis not present

## 2022-01-10 DIAGNOSIS — Z48812 Encounter for surgical aftercare following surgery on the circulatory system: Secondary | ICD-10-CM | POA: Diagnosis not present

## 2022-01-10 NOTE — Patient Outreach (Signed)
  Care Coordination   Initial Visit Note   01/10/2022 Name: Tony [name] MRN: [id number] DOB: 1950-08-26  Tony [name] is a 71 y.o. year old male who sees Lajean Manes, MD for primary care. I spoke with  Tony [name] by phone today.  What matters to the patients health and wellness today?  No Concerns Expressed. Requested Call Back    Goals Addressed   None     SDOH assessments and interventions completed:  No     Care Coordination Interventions Activated:  No  Care Coordination Interventions:  No, not indicated   Follow up plan:  Requested call back on 01/11/22.    Encounter Outcome:  Pt. Scheduled   [facility] [phone]

## 2022-01-11 ENCOUNTER — Ambulatory Visit: Payer: Self-pay

## 2022-01-11 NOTE — Patient Outreach (Signed)
  Care Coordination   01/11/2022 Name: Tony [name] MRN: [id number] DOB: 1950-12-19   Care Coordination Outreach Attempts:  An unsuccessful telephone outreach was attempted for a scheduled appointment today.  Follow Up Plan:  Additional outreach attempts will be made to offer the patient care coordination information and services.   Encounter Outcome:  No Answer  Care Coordination Interventions Activated:  No   Care Coordination Interventions:  No, not indicated    [facility] [phone]

## 2022-01-12 ENCOUNTER — Encounter (HOSPITAL_COMMUNITY)
Admission: RE | Admit: 2022-01-12 | Discharge: 2022-01-12 | Disposition: A | Discharge status: Home / Self Care | Payer: Medicare PPO | Source: Ambulatory Visit | Attending: Cardiovascular Disease | Admitting: Cardiovascular Disease

## 2022-01-12 DIAGNOSIS — Z952 Presence of prosthetic heart valve: Secondary | ICD-10-CM | POA: Diagnosis not present

## 2022-01-12 DIAGNOSIS — Z48812 Encounter for surgical aftercare following surgery on the circulatory system: Secondary | ICD-10-CM | POA: Diagnosis not present

## 2022-01-15 ENCOUNTER — Encounter (HOSPITAL_COMMUNITY)
Admission: RE | Admit: 2022-01-15 | Discharge: 2022-01-15 | Disposition: A | Discharge status: Home / Self Care | Payer: Medicare PPO | Source: Ambulatory Visit | Attending: Cardiovascular Disease | Admitting: Cardiovascular Disease

## 2022-01-15 ENCOUNTER — Ambulatory Visit (INDEPENDENT_AMBULATORY_CARE_PROVIDER_SITE_OTHER): Payer: Medicare PPO | Admitting: Podiatry

## 2022-01-15 DIAGNOSIS — Z952 Presence of prosthetic heart valve: Secondary | ICD-10-CM | POA: Diagnosis not present

## 2022-01-15 DIAGNOSIS — Z48812 Encounter for surgical aftercare following surgery on the circulatory system: Secondary | ICD-10-CM | POA: Diagnosis not present

## 2022-01-15 DIAGNOSIS — Z91199 Patient's noncompliance with other medical treatment and regimen due to unspecified reason: Secondary | ICD-10-CM

## 2022-01-15 NOTE — Progress Notes (Signed)
No show

## 2022-01-15 NOTE — Progress Notes (Signed)
Reviewed home exercise guidelines with patient including endpoints, temperature precautions, target heart rate and rate of perceived exertion. Patient plans to exercise at local fitness center, walking the track and using the equipment as his mode of home exercise. Patient's goal is to increase leg strength. Patient voices understanding of instructions given.  Sol Passer, MS, ACSM CEP

## 2022-01-16 NOTE — Progress Notes (Signed)
Cardiac Individual Treatment Plan  Patient Details  Name: Tony [name] MRN: [id number] Date of Birth: 06-28-1950 Referring Provider:   Flowsheet Row INTENSIVE CARDIAC REHAB ORIENT from 12/21/2021 in [facility]  Referring Provider [name], MD       Initial Encounter Date:  [facility] from 12/21/2021 in [facility]  Date 12/21/21       Visit Diagnosis: 08/29/21 S/P TAVR   Patient's Home Medications on Admission:  Current Outpatient Medications:    acetaminophen (TYLENOL) 500 MG tablet, Take 500 mg by mouth every 6 (six) hours as needed for moderate pain (pain)., Disp: , Rfl:    amoxicillin (AMOXIL) 500 MG tablet, Take 4 tablets (2,000 mg total) by mouth as directed. 1 hour prior to dental work including cleanings, Disp: 12 tablet, Rfl: 12   aspirin EC 81 MG tablet, Take 81 mg by mouth daily., Disp: , Rfl:    atorvastatin (LIPITOR) 40 MG tablet, Take 40 mg by mouth daily., Disp: , Rfl:    BD PEN NEEDLE NANO 2ND GEN 32G X 4 MM MISC, , Disp: , Rfl:    canagliflozin (INVOKANA) 300 MG TABS tablet, Take 300 mg by mouth daily before breakfast., Disp: , Rfl:    carvedilol (COREG) 3.125 MG tablet, Take 1 tablet (3.125 mg total) by mouth 2 (two) times daily with a meal., Disp: 180 tablet, Rfl: 2   Continuous Blood Gluc Receiver (FREESTYLE LIBRE 2 READER) DEVI, by Does not apply route., Disp: , Rfl:    cyclobenzaprine (FLEXERIL) 10 MG tablet, Take 10 mg by mouth 3 (three) times daily as needed for muscle spasms., Disp: , Rfl:    Dulaglutide (TRULICITY) 3 FW/2.6VZ SOPN, Inject 3 mg into the skin every Friday., Disp: , Rfl:    famotidine (PEPCID) 20 MG tablet, Take 20 mg by mouth daily., Disp: , Rfl:    gabapentin (NEURONTIN) 100 MG capsule, Take 2 capsules (200 mg total) by mouth at bedtime. (Patient taking differently: Take 100 mg by mouth at bedtime.), Disp: 60 capsule, Rfl: 2   Insulin  Aspart FlexPen (NOVOLOG) 100 UNIT/ML, Inject 5 Units into the skin daily., Disp: , Rfl:    insulin glargine (LANTUS SOLOSTAR) 100 UNIT/ML Solostar Pen, Inject 40 Units into the skin 2 (two) times daily., Disp: , Rfl:    Lancets (ONETOUCH DELICA PLUS [id number]) MISC, Apply topically., Disp: , Rfl:    levothyroxine (SYNTHROID) 200 MCG tablet, Take 200 mcg by mouth daily before breakfast., Disp: , Rfl:    meloxicam (MOBIC) 7.5 MG tablet, Take 7.5 mg by mouth daily as needed for pain., Disp: , Rfl:    sacubitril-valsartan (ENTRESTO) 49-51 MG, Take 1 tablet by mouth 2 (two) times daily., Disp: , Rfl:    sertraline (ZOLOFT) 100 MG tablet, Take 200 mg by mouth daily. , Disp: , Rfl:    Tamsulosin HCl (FLOMAX) 0.4 MG CAPS, Take 1 capsule (0.4 mg total) by mouth every morning., Disp: 30 capsule, Rfl: 0  Past Medical History: Past Medical History:  Diagnosis Date   Cancer ([place])    thyroid   CHF (congestive heart failure) (HCC)    Complete heart block (HCC)    a. 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 Burt Knack and Dr. Cyndia Bent   Severe aortic stenosis    Sleep apnea     Tobacco Use: Social History   Tobacco Use  Smoking Status Never  Smokeless Tobacco Never    Labs: Review Flowsheet       Latest Ref Rng & Units 07/19/2011 08/07/2021 08/25/2021 08/29/2021  Labs for ITP Cardiac and Pulmonary Rehab  Hemoglobin A1c 4.8 - 5.6 % 7.1  - 7.5  -  PH, Arterial 7.35 - 7.45 - 7.376  - -  PCO2 arterial 32 - 48 mmHg - 45.1  - -  Bicarbonate 20.0 - 28.0 mmol/L 20.0 - 28.0 mmol/L - 28.5  26.4  - -  TCO2 22 - 32 mmol/L - 30  28   - 23  26   O2 Saturation % % - 62  92  - -    Capillary Blood Glucose: Lab Results  Component Value Date   GLUCAP 174 (H) 01/08/2022   GLUCAP 177 (H) 01/03/2022   GLUCAP 204 (H) 12/27/2021   GLUCAP 208 (H) 12/25/2021   GLUCAP 206 (H) 12/25/2021     Exercise Target Goals: Exercise Program Goal: Individual exercise prescription set using results from initial 6 min walk test and THRR while considering  patient's activity barriers and safety.   Exercise Prescription Goal: Initial exercise prescription builds to 30-45 minutes a day of aerobic activity, 2-3 days per week.  Home exercise guidelines will be given to patient during program as part of exercise prescription that the participant will acknowledge.  Activity Barriers & Risk Stratification:  Activity Barriers & Cardiac Risk Stratification - 12/21/21 1236       Activity Barriers & Cardiac Risk Stratification   Activity Barriers Back Problems;Neck/Spine Problems;Joint Problems;Deconditioning;Muscular Weakness;Decreased Ventricular Function;Balance Concerns;Assistive Device   Voices using cane on uneven surfaces. Bilateral diabetic neuropathy in the feet.   Cardiac Risk Stratification High             6 Minute Walk:  6 Minute Walk     Row Name 12/21/21 1233         6 Minute Walk   Phase Initial     Distance 1342 feet     Walk Time 6 minutes     # of Rest Breaks 0     MPH 2.6     METS 2.68     RPE 11     Perceived Dyspnea  0     VO2 Peak 9.37     Symptoms Yes (comment)     Comments Aching in the hips, denies pain     Resting HR 66 bpm     Resting BP 134/70     Resting Oxygen Saturation  97 %     Exercise Oxygen Saturation  during 6 min walk 97 %     Max Ex. HR 97 bpm     Max Ex. BP 148/80     2 Minute Post BP 140/80              Oxygen Initial Assessment:   Oxygen Re-Evaluation:   Oxygen Discharge (Final Oxygen Re-Evaluation):   Initial Exercise Prescription:  Initial Exercise  Prescription - 12/21/21 1200       Date of Initial Exercise RX and Referring Provider   Date 12/21/21  Referring Provider [name], MD    Expected Discharge Date 02/16/22      NuStep   Level 2    SPM 75    Minutes 15    METs 2.7      Arm Ergometer   Level 1.5    RPM 60    Minutes 15    METs 2.7      Prescription Details   Frequency (times per week) 3    Duration Progress to 30 minutes of continuous aerobic without signs/symptoms of physical distress      Intensity   THRR 40-80% of Max Heartrate 59-118    Ratings of Perceived Exertion 11-13    Perceived Dyspnea 0-4      Progression   Progression Continue progressive overload as per policy without signs/symptoms or physical distress.      Resistance Training   Training Prescription Yes    Weight 3 lbs    Reps 10-15             Perform Capillary Blood Glucose checks as needed.  Exercise Prescription Changes:   Exercise Prescription Changes     Row Name 12/25/21 1030 01/03/22 1028 01/15/22 1023         Response to Exercise   Blood Pressure (Admit) 118/72 114/72 132/84     Blood Pressure (Exercise) 160/77 138/62 160/88     Blood Pressure (Exit) 132/74 112/72 134/70     Heart Rate (Admit) 66 bpm 61 bpm 69 bpm     Heart Rate (Exercise) 84 bpm 83 bpm 78 bpm     Heart Rate (Exit) 55 bpm -- 73 bpm     Rating of Perceived Exertion (Exercise) '12 12 11     '$ Symptoms None None None     Comments Off to a good start with exercise. -- --     Duration Continue with 30 min of aerobic exercise without signs/symptoms of physical distress. Continue with 30 min of aerobic exercise without signs/symptoms of physical distress. Continue with 30 min of aerobic exercise without signs/symptoms of physical distress.     Intensity THRR unchanged THRR unchanged THRR unchanged       Progression   Progression Continue to progress workloads to maintain intensity without signs/symptoms of physical distress. Continue to progress  workloads to maintain intensity without signs/symptoms of physical distress. Continue to progress workloads to maintain intensity without signs/symptoms of physical distress.     Average METs 1.8 1.8 2.2       Resistance Training   Training Prescription Yes No  Relaxation day, no weights. Yes     Weight 3 lbs -- 5 lbs     Reps 10-15 -- 10-15     Time 10 Minutes -- 10 Minutes       Interval Training   Interval Training No No No       NuStep   Level '2 2 2     '$ SPM 81 81 85     Minutes '15 15 15     '$ METs 1.9 1.8 2.4       Arm Ergometer   Level 1.5 1.5 1.5     [place]atts 7 7 --     Minutes '15 15 15     '$ METs 1.7 1.7 2       Home Exercise Plan   Plans to continue exercise at -- -- [employer] [employer] (comment)  [facility] [employer].  Frequency -- -- Add 2 additional days to program exercise sessions.     Initial Home Exercises Provided -- -- 01/15/22              Exercise Comments:   Exercise Comments     Row Name 12/25/21 1133 01/03/22 1137 01/15/22 1058       Exercise Comments Patient tolerated low intensity exercise without symptoms. Used chair for stability during warm-up/cool-down exercises. Reviewed THRR,METs, and goals with patient. Reviewed home exercise guidelines, METs, and goals with patient.              Exercise Goals and Review:   Exercise Goals     Row Name 12/21/21 1238             Exercise Goals   Increase Physical Activity Yes       Intervention Provide advice, education, support and counseling about physical activity/exercise needs.;Develop an individualized exercise prescription for aerobic and resistive training based on initial evaluation findings, risk stratification, comorbidities and participant's personal goals.       Expected Outcomes Short Term: Attend rehab on a regular basis to increase amount of physical activity.;Long Term: Add in home exercise to make exercise part of routine and to  increase amount of physical activity.;Long Term: Exercising regularly at least 3-5 days a week.       Increase Strength and Stamina Yes       Intervention Provide advice, education, support and counseling about physical activity/exercise needs.;Develop an individualized exercise prescription for aerobic and resistive training based on initial evaluation findings, risk stratification, comorbidities and participant's personal goals.       Expected Outcomes Long Term: Improve cardiorespiratory fitness, muscular endurance and strength as measured by increased METs and functional capacity (6MWT);Short Term: Perform resistance training exercises routinely during rehab and add in resistance training at home;Short Term: Increase workloads from initial exercise prescription for resistance, speed, and METs.       Able to understand and use rate of perceived exertion (RPE) scale Yes       Intervention Provide education and explanation on how to use RPE scale       Expected Outcomes Short Term: Able to use RPE daily in rehab to express subjective intensity level;Long Term:  Able to use RPE to guide intensity level when exercising independently       Knowledge and understanding of Target Heart Rate Range (THRR) Yes       Intervention Provide education and explanation of THRR including how the numbers were predicted and where they are located for reference       Expected Outcomes Short Term: Able to state/look up THRR;Short Term: Able to use daily as guideline for intensity in rehab;Long Term: Able to use THRR to govern intensity when exercising independently       Understanding of Exercise Prescription Yes       Intervention Provide education, explanation, and written materials on patient's individual exercise prescription       Expected Outcomes Short Term: Able to explain program exercise prescription;Long Term: Able to explain home exercise prescription to exercise independently                Exercise  Goals Re-Evaluation :  Exercise Goals Re-Evaluation     Row Name 12/25/21 1438 01/03/22 1137 01/15/22 1058         Exercise Goal Re-Evaluation   Exercise Goals Review Increase Physical Activity;Able to understand and use rate of perceived exertion (RPE) scale Increase Physical  Activity;Able to understand and use rate of perceived exertion (RPE) scale;Knowledge and understanding of Target Heart Rate Range (THRR);Able to check pulse independently Increase Physical Activity;Able to understand and use rate of perceived exertion (RPE) scale;Knowledge and understanding of Target Heart Rate Range (THRR);Able to check pulse independently;Increase Strength and Stamina;Understanding of Exercise Prescription     Comments Patient able to understand and use RPE scale appropriately. Making slow progress with exercise. Patient wants to know his THRR and to exercise in his target. Reviewed THRR with patient. Paitent is able to manually count his pulse. Patient is doing some walking at home, [facility]. Will review home exericse prescription with patient next week. Reviewed exercise prescription with patient. Patient plans to exercise at local fitness center, walking the track and using the equipment as his mode of home exercise. Patient's goal is to increase leg strength. Patient is able to monitor his pulse with exercise.     Expected Outcomes Progress workloads as tolerated to help increase strength and stamina. Improve cardiorespiratory fitness. Continue to progress workloads as tolerated. Paitent will [facility] to [facility] at cardiac rehab to achieve 150 minutes of aerobic exercise/week. Increase hand weights and workloads as tolerared to help increase strength.              Discharge Exercise Prescription (Final Exercise Prescription Changes):  Exercise Prescription Changes - 01/15/22 1023       Response to Exercise   Blood Pressure  (Admit) 132/84    Blood Pressure (Exercise) 160/88    Blood Pressure (Exit) 134/70    Heart Rate (Admit) 69 bpm    Heart Rate (Exercise) 78 bpm    Heart Rate (Exit) 73 bpm    Rating of Perceived Exertion (Exercise) 11    Symptoms None    Duration Continue with 30 min of aerobic exercise without signs/symptoms of physical distress.    Intensity THRR unchanged      Progression   Progression Continue to progress workloads to maintain intensity without signs/symptoms of physical distress.    Average METs 2.2      Resistance Training   Training Prescription Yes    Weight 5 lbs    Reps 10-15    Time 10 Minutes      Interval Training   Interval Training No      NuStep   Level 2    SPM 85    Minutes 15    METs 2.4      Arm Ergometer   Level 1.5    Minutes 15    METs 2      Home Exercise Plan   Plans to continue exercise at [facility] (comment)   [facility] [employer].   Frequency Add 2 additional days to program exercise sessions.    Initial Home Exercises Provided 01/15/22             Nutrition:  Target Goals: Understanding of nutrition guidelines, daily intake of sodium '1500mg'$ , cholesterol '200mg'$ , calories 30% from fat and 7% or less from saturated fats, daily to have 5 or more servings of fruits and vegetables.  Biometrics:  Pre Biometrics - 12/21/21 1005       Pre Biometrics   Waist Circumference 48 inches    Hip Circumference 48 inches    Waist to Hip Ratio 1 %  Triceps Skinfold 9 mm    % Body Fat 31 %    Grip Strength 46 kg    Flexibility 0 in   Unable to reach   Single Leg Stand 2.25 seconds              Nutrition Therapy Plan and Nutrition Goals:  Nutrition Therapy & Goals - 12/25/21 1151       Nutrition Therapy   Diet Heart Healthy/Carbohydrate Consistent Diet    Drug/Food Interactions Statins/Certain Fruits      Personal Nutrition Goals   Nutrition Goal Patient to identify strategies  for decreasing cardiovascular risk by attending the [employer] lifestyle and nutrition education weekly classes.    Personal Goal #2 Patient to use the plate method as a guide for meal planning to include fruits, vegetables, whole grains, lean protein/plant protein, nonfat dairy as part of heart healthy lifestyle    Personal Goal #3 Patient to identify and limit daily food sources of refind carbohydrates, saturated fat, trans fat, and sodium    Personal Goal #4 Patient to reduce sodium intake '1500mg'$  per day    Comments Patient reports eating out frequently. He is currently seeing counseling for depression support. He reports good support from his children.      Intervention Plan   Intervention Prescribe, educate and counsel regarding individualized specific dietary modifications aiming towards targeted core components such as weight, hypertension, lipid management, diabetes, heart failure and other comorbidities.;Nutrition handout(s) given to patient.    Expected Outcomes Short Term Goal: Understand basic principles of dietary content, such as calories, fat, sodium, cholesterol and nutrients.;Long Term Goal: Adherence to prescribed nutrition plan.             Nutrition Assessments:  MEDIFICTS Score Key: ?70 Need to make dietary changes  40-70 Heart Healthy Diet ? 40 Therapeutic Level Cholesterol Diet    Picture Your Plate Scores: <93 Unhealthy dietary pattern with much room for improvement. 41-50 Dietary pattern unlikely to meet recommendations for good health and room for improvement. 51-60 More healthful dietary pattern, with some room for improvement.  >60 Healthy dietary pattern, although there may be some specific behaviors that could be improved.    Nutrition Goals Re-Evaluation:  Nutrition Goals Re-Evaluation     [place] Name 12/25/21 1151             Goals   Current Weight 236 lb 15.9 oz (107.5 kg)       Comment A1c 7.5       Expected Outcome Patient reports motivation  to reduce frequency of  eating out. He is currently seeing counseling for depression support. He reports good support from his children.                Nutrition Goals Re-Evaluation:  Nutrition Goals Re-Evaluation     [place] Name 12/25/21 1151             Goals   Current Weight 236 lb 15.9 oz (107.5 kg)       Comment A1c 7.5       Expected Outcome Patient reports motivation to reduce frequency of  eating out. He is currently seeing counseling for depression support. He reports good support from his children.                Nutrition Goals Discharge (Final Nutrition Goals Re-Evaluation):  Nutrition Goals Re-Evaluation - 12/25/21 1151       Goals   Current Weight 236 lb 15.9 oz (107.5 kg)  Comment A1c 7.5    Expected Outcome Patient reports motivation to reduce frequency of  eating out. He is currently seeing counseling for depression support. He reports good support from his children.             Psychosocial: Target Goals: Acknowledge presence or absence of significant depression and/or stress, maximize coping skills, provide positive support system. Participant is able to verbalize types and ability to use techniques and skills needed for reducing stress and depression.  Initial Review & Psychosocial Screening:  Initial Psych Review & Screening - 12/21/21 1103       Initial Review   Current issues with History of Depression;Current Depression;Current Psychotropic Meds;Current Sleep Concerns      Family Dynamics   Good Support System? Yes   [name] lives alone. Linna Hoff has his 2 children for support who lives in the area. Linna Hoff also has his faith.   Comments Linna Hoff says he has been experiencing some depression since his good firend passed away last 04/08/23. Linna Hoff is currently receiving couselling with a psychologist which has been very helpful. Linna Hoff is taking an antidepressant which he has for years      Barriers   Psychosocial barriers to participate in program The patient  should benefit from training in stress management and relaxation.      Screening Interventions   Interventions Encouraged to exercise;To provide support and resources with identified psychosocial needs    Expected Outcomes Long Term Goal: Stressors or current issues are controlled or eliminated.;Short Term goal: Identification and review with participant of any Quality of Life or Depression concerns found by scoring the questionnaire.;Long Term goal: The participant improves quality of Life and PHQ9 Scores as seen by post scores and/or verbalization of changes             Quality of Life Scores:  Quality of Life - 12/21/21 1227       Quality of Life   Select Quality of Life      Quality of Life Scores   Health/Function Pre 17.3 %    Socioeconomic Pre 21.83 %    Psych/Spiritual Pre 22.21 %    Family Pre 25.5 %    GLOBAL Pre 20.25 %            Scores of 19 and below usually indicate a poorer quality of life in these areas.  A difference of  2-3 points is a clinically meaningful difference.  A difference of 2-3 points in the total score of the Quality of Life Index has been associated with significant improvement in overall quality of life, self-image, physical symptoms, and general health in studies assessing change in quality of life.  PHQ-9: Review Flowsheet       12/21/2021 12/21/2015  Depression screen PHQ 2/9  Decreased Interest 1 0  Down, Depressed, Hopeless 1 0  PHQ - 2 Score 2 0  Altered sleeping 1 -  Tired, decreased energy 1 -  Change in appetite 1 -  Feeling bad or failure about yourself  0 -  Trouble concentrating 0 -  Moving slowly or fidgety/restless 0 -  PHQ-9 Score 5 -  Difficult doing work/chores Somewhat difficult -   Interpretation of Total Score  Total Score Depression Severity:  1-4 = Minimal depression, 5-9 = Mild depression, 10-14 = Moderate depression, 15-19 = Moderately severe depression, 20-27 = Severe depression   Psychosocial Evaluation  and Intervention:   Psychosocial Re-Evaluation:  Psychosocial Re-Evaluation     [place] Name 12/25/21 1556  12/27/21 1130           Psychosocial Re-Evaluation   Current issues with Current Stress Concerns;Current Sleep Concerns;History of Depression;Current Psychotropic Meds;Current Depression Current Stress Concerns;Current Sleep Concerns;History of Depression;Current Psychotropic Meds;Current Depression      Comments Linna Hoff started cardiac rehab today. Will review quality of life questionnaire in the upcoming week Reviewed quality of life questionnaire. Linna Hoff is dissatisfied with his health due to deconditioning. Timmothy Sours is currently receiving counselling which has been helpful for him.      Expected Outcomes Linna Hoff will have decreased or controlled depression upon completion of intensive cardiac rehab. Linna Hoff will have decreased or controlled depression upon completion of intensive cardiac rehab.      Interventions Encouraged to attend Cardiac Rehabilitation for the exercise;Relaxation education;Stress management education Encouraged to attend Cardiac Rehabilitation for the exercise;Relaxation education;Stress management education      Continue Psychosocial Services  Follow up required by staff Follow up required by staff        Initial Review   Source of Stress Concerns Chronic Illness Chronic Illness      Comments Will continue to montior and offer support as needed Will continue to montior and offer support as needed               Psychosocial Discharge (Final Psychosocial Re-Evaluation):  Psychosocial Re-Evaluation - 12/27/21 1130       Psychosocial Re-Evaluation   Current issues with Current Stress Concerns;Current Sleep Concerns;History of Depression;Current Psychotropic Meds;Current Depression    Comments Reviewed quality of life questionnaire. Linna Hoff is dissatisfied with his health due to deconditioning. Timmothy Sours is currently receiving counselling which has been helpful for him.    Expected  Outcomes Linna Hoff will have decreased or controlled depression upon completion of intensive cardiac rehab.    Interventions Encouraged to attend Cardiac Rehabilitation for the exercise;Relaxation education;Stress management education    Continue Psychosocial Services  Follow up required by staff      Initial Review   Source of Stress Concerns Chronic Illness    Comments Will continue to montior and offer support as needed             Vocational Rehabilitation: Provide vocational rehab assistance to qualifying candidates.   Vocational Rehab Evaluation & Intervention:  Vocational Rehab - 12/21/21 1107       Initial Vocational Rehab Evaluation & Intervention   Assessment shows need for Vocational Rehabilitation No   Linna Hoff is retired and does not need vocatinal rehab at this time            Education: Education Goals: Education classes will be provided on a weekly basis, covering required topics. Participant will state understanding/return demonstration of topics presented.    Education     Row Name 12/25/21 1200     Education   Cardiac Education Topics [facility]   [employer] Workshops     Workshops   Educator Exercise Physiologist   Select Exercise   Exercise Workshop Hotel manager and Fall Prevention   Instruction Review Code 1- Verbalizes Understanding   Class Start Time 1145   Class Stop Time 1229   Class Time Calculation (min) 44 min    [place] Name 01/03/22 1300     Education   Cardiac Education Topics Pritikin   Financial trader   Weekly Topic Tasty Appetizers and Snacks   Instruction Review Code 1- Verbalizes Understanding   Class Start Time 1144   Class Stop Time  1227   Class Time Calculation (min) 43 min    [place] Name 01/08/22 1400     Education   Cardiac Education Topics Pritikin   Scientist, research (life sciences)   Educator Dietitian   Select Nutrition   Nutrition Calorie Density   Instruction Review  Code 1- Verbalizes Understanding   Class Start Time 1140   Class Stop Time 1230   Class Time Calculation (min) 50 min    [place] Name 01/10/22 1200     Education   Cardiac Education Topics Pritikin   Financial trader   Weekly Topic [employer] Desserts   Instruction Review Code 1- Verbalizes Understanding   Class Start Time 1130   Class Stop Time 1205   Class Time Calculation (min) 35 min    [place] Name 01/15/22 1300     Education   Cardiac Education Topics [place]   Environmental consultant Exercise   Exercise Workshop Exercise Basics: Press photographer   Instruction Review Code 1- Verbalizes Understanding   Class Start Time 1140   Class Stop Time 1224   Class Time Calculation (min) 44 min            Core Videos: Exercise    Move It!  Clinical staff conducted group or individual video education with verbal and written material and guidebook.  Patient learns the recommended Pritikin exercise program. Exercise with the goal of living a long, healthy life. Some of the health benefits of exercise include controlled diabetes, healthier blood pressure levels, improved cholesterol levels, improved heart and lung capacity, improved sleep, and better body composition. Everyone should speak with their doctor before starting or changing an exercise routine.  Biomechanical Limitations Clinical staff conducted group or individual video education with verbal and written material and guidebook.  Patient learns how biomechanical limitations can impact exercise and how we can mitigate and possibly overcome limitations to have an impactful and balanced exercise routine.  Body Composition Clinical staff conducted group or individual video education with verbal and written material and guidebook.  Patient learns that body composition (ratio of muscle mass to fat mass) is a key component to  assessing overall fitness, rather than body weight alone. Increased fat mass, especially visceral belly fat, can put [place] at increased risk for metabolic syndrome, type 2 diabetes, heart disease, and even death. It is recommended to combine diet and exercise (cardiovascular and resistance training) to improve your body composition. Seek guidance from your physician and exercise physiologist before implementing an exercise routine.  Exercise Action Plan Clinical staff conducted group or individual video education with verbal and written material and guidebook.  Patient learns the recommended strategies to achieve and enjoy long-term exercise adherence, including variety, self-motivation, self-efficacy, and positive decision making. Benefits of exercise include fitness, good health, weight management, more energy, better sleep, less stress, and overall well-being.  Medical   Heart Disease Risk Reduction Clinical staff conducted group or individual video education with verbal and written material and guidebook.  Patient learns our heart is our most vital organ as it circulates oxygen, nutrients, white blood cells, and hormones throughout the entire body, and carries waste away. Data supports a plant-based eating plan like the Pritikin Program for its effectiveness in slowing progression of and reversing heart disease. The video provides a number of recommendations to address heart disease.  Metabolic Syndrome and Belly Fat  Clinical staff conducted group or individual video education with verbal and written material and guidebook.  Patient learns what metabolic syndrome is, how it leads to heart disease, and how one can reverse it and keep it from coming back. You have metabolic syndrome if you have 3 of the following 5 criteria: abdominal obesity, high blood pressure, high triglycerides, low HDL cholesterol, and high blood sugar.  Hypertension and Heart Disease Clinical staff conducted group or  individual video education with verbal and written material and guidebook.  Patient learns that high blood pressure, or hypertension, is very common in the [place]. Hypertension is largely due to excessive salt intake, but other important risk factors include being overweight, physical inactivity, drinking too much alcohol, smoking, and not eating enough potassium from fruits and vegetables. High blood pressure is a leading risk factor for heart attack, stroke, congestive heart failure, dementia, kidney failure, and premature death. Long-term effects of excessive salt intake include stiffening of the arteries and thickening of heart muscle and organ damage. Recommendations include ways to reduce hypertension and the risk of heart disease.  Diseases of Our Time - Focusing on Diabetes Clinical staff conducted group or individual video education with verbal and written material and guidebook.  Patient learns why the best way to stop diseases of our time is prevention, through food and other lifestyle changes. Medicine (such as prescription pills and surgeries) is often only a Band-Aid on the problem, not a long-term solution. Most common diseases of our time include obesity, type 2 diabetes, hypertension, heart disease, and cancer. The Pritikin Program is recommended and has been proven to help reduce, reverse, and/or prevent the damaging effects of metabolic syndrome.  Nutrition   Overview of the Pritikin Eating Plan  Clinical staff conducted group or individual video education with verbal and written material and guidebook.  Patient learns about the [place] for disease risk reduction. The [place] emphasizes a wide variety of unrefined, minimally-processed carbohydrates, like fruits, vegetables, whole grains, and legumes. Go, Caution, and Stop food choices are explained. Plant-based and lean animal proteins are emphasized. Rationale provided for low sodium intake for blood  pressure control, low added sugars for blood sugar stabilization, and low added fats and oils for coronary artery disease risk reduction and weight management.  Calorie Density  Clinical staff conducted group or individual video education with verbal and written material and guidebook.  Patient learns about calorie density and how it impacts the Pritikin Eating Plan. Knowing the characteristics of the food you choose will help you decide whether those foods will lead to weight gain or weight loss, and whether you want to consume more or less of them. Weight loss is usually a side effect of the Pritikin Eating Plan because of its focus on low calorie-dense foods.  Label Reading  Clinical staff conducted group or individual video education with verbal and written material and guidebook.  Patient learns about the Pritikin recommended label reading guidelines and corresponding recommendations regarding calorie density, added sugars, sodium content, and whole grains.  Dining Out - Part 1  Clinical staff conducted group or individual video education with verbal and written material and guidebook.  Patient learns that restaurant meals can be sabotaging because they can be so high in calories, fat, sodium, and/or sugar. Patient learns recommended strategies on how to positively address this and avoid unhealthy pitfalls.  Facts on Fats  Clinical staff conducted group or individual video education with  verbal and written material and guidebook.  Patient learns that lifestyle modifications can be just as effective, if not more so, as many medications for lowering your risk of heart disease. A Pritikin lifestyle can help to reduce your risk of inflammation and atherosclerosis (cholesterol build-up, or plaque, in the artery walls). Lifestyle interventions such as dietary choices and physical activity address the cause of atherosclerosis. A review of the types of fats and their impact on blood cholesterol levels,  along with dietary recommendations to reduce fat intake is also included.  Nutrition Action Plan  Clinical staff conducted group or individual video education with verbal and written material and guidebook.  Patient learns how to incorporate Pritikin recommendations into their lifestyle. Recommendations include planning and keeping personal health goals in mind as an important part of their success.  Healthy Mind-Set    Healthy Minds, Bodies, Hearts  Clinical staff conducted group or individual video education with verbal and written material and guidebook.  Patient learns how to identify when they are stressed. Video will discuss the impact of that stress, as well as the many benefits of stress management. Patient will also be introduced to stress management techniques. The way we think, act, and feel has an impact on our hearts.  How Our Thoughts Can Heal Our Hearts  Clinical staff conducted group or individual video education with verbal and written material and guidebook.  Patient learns that negative thoughts can cause depression and anxiety. This can result in negative lifestyle behavior and serious health problems. Cognitive behavioral therapy is an effective method to help control our thoughts in order to change and improve our emotional outlook.  Additional Videos:  Exercise    Improving Performance  Clinical staff conducted group or individual video education with verbal and written material and guidebook.  Patient learns to use a non-linear approach by alternating intensity levels and lengths of time spent exercising to help burn more calories and lose more body fat. Cardiovascular exercise helps improve heart health, metabolism, hormonal balance, blood sugar control, and recovery from fatigue. Resistance training improves strength, endurance, balance, coordination, reaction time, metabolism, and muscle mass. Flexibility exercise improves circulation, posture, and balance. Seek  guidance from your physician and exercise physiologist before implementing an exercise routine and learn your capabilities and proper form for all exercise.  Introduction to Yoga  Clinical staff conducted group or individual video education with verbal and written material and guidebook.  Patient learns about yoga, a discipline of the coming together of mind, breath, and body. The benefits of yoga include improved flexibility, improved range of motion, better posture and core strength, increased lung function, weight loss, and positive self-image. Yoga's heart health benefits include lowered blood pressure, healthier heart rate, decreased cholesterol and triglyceride levels, improved immune function, and reduced stress. Seek guidance from your physician and exercise physiologist before implementing an exercise routine and learn your capabilities and proper form for all exercise.  Medical   Aging: Enhancing Your Quality of Life  Clinical staff conducted group or individual video education with verbal and written material and guidebook.  Patient learns key strategies and recommendations to stay in good physical health and enhance quality of life, such as prevention strategies, having an advocate, securing a [place], and keeping a list of medications and system for tracking them. It also discusses how to avoid risk for bone loss.  Biology of Weight Control  Clinical staff conducted group or individual video education with verbal and written  material and guidebook.  Patient learns that weight gain occurs because we consume more calories than we burn (eating more, moving less). Even if your body weight is normal, you may have higher ratios of fat compared to muscle mass. Too much body fat puts you at increased risk for cardiovascular disease, heart attack, stroke, type 2 diabetes, and obesity-related cancers. In addition to exercise, following the [place] can help  reduce your risk.  Decoding Lab Results  Clinical staff conducted group or individual video education with verbal and written material and guidebook.  Patient learns that lab test reflects one measurement whose values change over time and are influenced by many factors, including medication, stress, sleep, exercise, food, hydration, pre-existing medical conditions, and more. It is recommended to use the knowledge from this video to become more involved with your lab results and evaluate your numbers to speak with your doctor.   Diseases of Our Time - Overview  Clinical staff conducted group or individual video education with verbal and written material and guidebook.  Patient learns that according to the CDC, 50% to 70% of chronic diseases (such as obesity, type 2 diabetes, elevated lipids, hypertension, and heart disease) are avoidable through lifestyle improvements including healthier food choices, listening to satiety cues, and increased physical activity.  Sleep Disorders Clinical staff conducted group or individual video education with verbal and written material and guidebook.  Patient learns how good quality and duration of sleep are important to overall health and well-being. Patient also learns about sleep disorders and how they impact health along with recommendations to address them, including discussing with a physician.  Nutrition  Dining Out - Part 2 Clinical staff conducted group or individual video education with verbal and written material and guidebook.  Patient learns how to plan ahead and communicate in order to maximize their dining experience in a healthy and nutritious manner. Included are recommended food choices based on the type of restaurant the patient is visiting.   Fueling a Best boy conducted group or individual video education with verbal and written material and guidebook.  There is a strong connection between our food choices and our health.  Diseases like obesity and type 2 diabetes are very prevalent and are in large-part due to lifestyle choices. The Pritikin Eating Plan provides plenty of food and hunger-curbing satisfaction. It is easy to follow, affordable, and helps reduce health risks.  Menu Workshop  Clinical staff conducted group or individual video education with verbal and written material and guidebook.  Patient learns that restaurant meals can sabotage health goals because they are often packed with calories, fat, sodium, and sugar. Recommendations include strategies to plan ahead and to communicate with the manager, chef, or server to help order a healthier meal.  Planning Your Eating Strategy  Clinical staff conducted group or individual video education with verbal and written material and guidebook.  Patient learns about the [place] and its benefit of reducing the risk of disease. The [place] does not focus on calories. Instead, it emphasizes high-quality, nutrient-rich foods. By knowing the characteristics of the foods, we choose, we can determine their calorie density and make informed decisions.  Targeting Your Nutrition Priorities  Clinical staff conducted group or individual video education with verbal and written material and guidebook.  Patient learns that lifestyle habits have a tremendous impact on disease risk and progression. This video provides eating and physical activity recommendations based on your personal health goals, such as  reducing LDL cholesterol, losing weight, preventing or controlling type 2 diabetes, and reducing high blood pressure.  Vitamins and Minerals  Clinical staff conducted group or individual video education with verbal and written material and guidebook.  Patient learns different ways to obtain key vitamins and minerals, including through a recommended healthy diet. It is important to discuss all supplements you take with your doctor.   Healthy Mind-Set     Smoking Cessation  Clinical staff conducted group or individual video education with verbal and written material and guidebook.  Patient learns that cigarette smoking and tobacco addiction pose a serious health risk which affects millions of people. Stopping smoking will significantly reduce the risk of heart disease, lung disease, and many forms of cancer. Recommended strategies for quitting are covered, including working with your doctor to develop a successful plan.  Culinary   Becoming a Financial trader conducted group or individual video education with verbal and written material and guidebook.  Patient learns that cooking at home can be healthy, cost-effective, quick, and puts them in control. Keys to cooking healthy recipes will include looking at your recipe, assessing your equipment needs, planning ahead, making it simple, choosing cost-effective seasonal ingredients, and limiting the use of added fats, salts, and sugars.  Cooking - Breakfast and Snacks  Clinical staff conducted group or individual video education with verbal and written material and guidebook.  Patient learns how important breakfast is to satiety and nutrition through the entire day. Recommendations include key foods to eat during breakfast to help stabilize blood sugar levels and to prevent overeating at meals later in the day. Planning ahead is also a key component.  Cooking - Human resources officer conducted group or individual video education with verbal and written material and guidebook.  Patient learns eating strategies to improve overall health, including an approach to cook more at home. Recommendations include thinking of animal protein as a side on your plate rather than center stage and focusing instead on lower calorie dense options like vegetables, fruits, whole grains, and plant-based proteins, such as beans. Making sauces in large quantities to freeze for later and leaving the skin  on your vegetables are also recommended to maximize your experience.  Cooking - Healthy Salads and Dressing Clinical staff conducted group or individual video education with verbal and written material and guidebook.  Patient learns that vegetables, fruits, whole grains, and legumes are the foundations of the [place]. Recommendations include how to incorporate each of these in flavorful and healthy salads, and how to create homemade salad dressings. Proper handling of ingredients is also covered. Cooking - Soups and [employer] - Soups and Desserts Clinical staff conducted group or individual video education with verbal and written material and guidebook.  Patient learns that Pritikin soups and desserts make for easy, nutritious, and delicious snacks and meal components that are low in sodium, fat, sugar, and calorie density, while high in vitamins, minerals, and filling fiber. Recommendations include simple and healthy ideas for soups and desserts.   Overview     The Pritikin Solution Program Overview Clinical staff conducted group or individual video education with verbal and written material and guidebook.  Patient learns that the results of the [place] Program have been documented in more than 100 articles published in peer-reviewed journals, and the benefits include reducing risk factors for (and, in some cases, even reversing) high cholesterol, high blood pressure, type 2 diabetes, obesity, and more! An overview of  the three key pillars of the Pritikin Program will be covered: eating well, doing regular exercise, and having a healthy mind-set.  WORKSHOPS  Exercise: Exercise Basics: Building Your Action Plan Clinical staff led group instruction and group discussion with PowerPoint presentation and patient guidebook. To enhance the learning environment the use of posters, models and videos may be added. At the conclusion of this workshop, patients will comprehend the  difference between physical activity and exercise, as well as the benefits of incorporating both, into their routine. Patients will understand the FITT (Frequency, Intensity, Time, and Type) principle and how to use it to build an exercise action plan. In addition, safety concerns and other considerations for exercise and cardiac rehab will be addressed by the presenter. The purpose of this lesson is to promote a comprehensive and effective weekly exercise routine in order to improve patients' overall level of fitness.   Managing Heart Disease: Your Path to a Healthier Heart Clinical staff led group instruction and group discussion with PowerPoint presentation and patient guidebook. To enhance the learning environment the use of posters, models and videos may be added.At the conclusion of this workshop, patients will understand the anatomy and physiology of the heart. Additionally, they will understand how Pritikin's three pillars impact the risk factors, the progression, and the management of heart disease.  The purpose of this lesson is to provide a high-level overview of the heart, heart disease, and how the Pritikin lifestyle positively impacts risk factors.  Exercise Biomechanics Clinical staff led group instruction and group discussion with PowerPoint presentation and patient guidebook. To enhance the learning environment the use of posters, models and videos may be added. Patients will learn how the structural parts of their bodies function and how these functions impact their daily activities, movement, and exercise. Patients will learn how to promote a neutral spine, learn how to manage pain, and identify ways to improve their physical movement in order to promote healthy living. The purpose of this lesson is to expose patients to common physical limitations that impact physical activity. Participants will learn practical ways to adapt and manage aches and pains, and to minimize their  effect on regular exercise. Patients will learn how to maintain good posture while sitting, walking, and lifting.  Balance Training and Fall Prevention  Clinical staff led group instruction and group discussion with PowerPoint presentation and patient guidebook. To enhance the learning environment the use of posters, models and videos may be added. At the conclusion of this workshop, patients will understand the importance of their sensorimotor skills (vision, proprioception, and the vestibular system) in maintaining their ability to balance as they age. Patients will apply a variety of balancing exercises that are appropriate for their current level of function. Patients will understand the common causes for poor balance, possible solutions to these problems, and ways to modify their physical environment in order to minimize their fall risk. The purpose of this lesson is to teach patients about the importance of maintaining balance as they age and ways to minimize their risk of falling.  WORKSHOPS   Nutrition:  Fueling a Scientist, research (physical sciences) led group instruction and group discussion with PowerPoint presentation and patient guidebook. To enhance the learning environment the use of posters, models and videos may be added. Patients will review the foundational principles of the [place] and understand what constitutes a serving size in each of the food groups. Patients will also learn Pritikin-friendly foods that are better choices when away from home  and review make-ahead meal and snack options. Calorie density will be reviewed and applied to three nutrition priorities: weight maintenance, weight loss, and weight gain. The purpose of this lesson is to reinforce (in a group setting) the key concepts around what patients are recommended to eat and how to apply these guidelines when away from home by planning and selecting Pritikin-friendly options. Patients will understand how calorie  density may be adjusted for different weight management goals.  Mindful Eating  Clinical staff led group instruction and group discussion with PowerPoint presentation and patient guidebook. To enhance the learning environment the use of posters, models and videos may be added. Patients will briefly review the concepts of the [place] and the importance of low-calorie dense foods. The concept of mindful eating will be introduced as well as the importance of paying attention to internal hunger signals. Triggers for non-hunger eating and techniques for dealing with triggers will be explored. The purpose of this lesson is to provide patients with the opportunity to review the basic principles of the [place], discuss the value of eating mindfully and how to measure internal cues of hunger and fullness using the Hunger Scale. Patients will also discuss reasons for non-hunger eating and learn strategies to use for controlling emotional eating.  Targeting Your Nutrition Priorities Clinical staff led group instruction and group discussion with PowerPoint presentation and patient guidebook. To enhance the learning environment the use of posters, models and videos may be added. Patients will learn how to determine their genetic susceptibility to disease by reviewing their family history. Patients will gain insight into the importance of diet as part of an overall healthy lifestyle in mitigating the impact of genetics and other environmental insults. The purpose of this lesson is to provide patients with the opportunity to assess their personal nutrition priorities by looking at their family history, their own health history and current risk factors. Patients will also be able to discuss ways of prioritizing and modifying the [place] for their highest risk areas  Menu  Clinical staff led group instruction and group discussion with PowerPoint presentation and patient guidebook. To  enhance the learning environment the use of posters, models and videos may be added. Using menus brought in from [employer], or printed from [employer], patients will apply the [place] dining out guidelines that were presented in the [employer] video. Patients will also be able to practice these guidelines in a variety of provided scenarios. The purpose of this lesson is to provide patients with the opportunity to practice hands-on learning of the [place] with actual menus and practice scenarios.  Label Reading Clinical staff led group instruction and group discussion with PowerPoint presentation and patient guidebook. To enhance the learning environment the use of posters, models and videos may be added. Patients will review and discuss the [facility] label reading guidelines presented in [employer]'s Label Reading Educational series video. Using fool labels brought in from local grocery stores and markets, patients will apply the label reading guidelines and determine if the packaged food meet the [facility] guidelines. The purpose of this lesson is to provide patients with the opportunity to review, discuss, and practice hands-on learning of the [facility] Label Reading guidelines with actual packaged food labels. [place] Workshops are designed to teach patients ways to prepare quick, simple, and affordable recipes at home. The importance of nutrition's role in chronic disease risk reduction is reflected in its  emphasis in the overall [facility] program. By learning how to prepare essential core [facility] Eating Plan recipes, patients will increase control over what they eat; be able to customize the flavor of foods without the use of added salt, sugar, or fat; and improve the quality of the food they consume. By learning a set of core recipes which are easily assembled, quickly prepared, and affordable, patients are more likely to  prepare more healthy foods at home. These workshops focus on convenient breakfasts, simple entres, side dishes, and desserts which can be prepared with minimal effort and are consistent with nutrition recommendations for cardiovascular risk reduction. Cooking [employer] are taught by a Engineer, materials (RD) who has been trained by the [employer]. The chef or RD has a clear understanding of the importance of minimizing - if not completely eliminating - added fat, sugar, and sodium in recipes. Throughout the series of [place] Workshop sessions, patients will learn about healthy ingredients and efficient methods of cooking to build confidence in their capability to prepare    Cooking School weekly topics:  Adding Flavor- Sodium-Free  Fast and Healthy Breakfasts  Powerhouse Plant-Based Proteins  Satisfying Salads and Dressings  Simple Sides and Sauces  International Cuisine-Spotlight on the [place] Zones  Delicious Desserts  Savory Soups  Efficiency Cooking - Meals in a Snap  Tasty Appetizers and Snacks  Comforting Weekend Breakfasts  One-Pot Wonders   Fast Evening Meals  Easy [place] (Psychosocial): New Thoughts, New Behaviors Clinical staff led group instruction and group discussion with PowerPoint presentation and patient guidebook. To enhance the learning environment the use of posters, models and videos may be added. Patients will learn and practice techniques for developing effective health and lifestyle goals. Patients will be able to effectively apply the goal setting process learned to develop at least one new personal goal.  The purpose of this lesson is to expose patients to a new skill set of behavior modification techniques such as techniques setting SMART goals, overcoming barriers, and achieving new thoughts and new behaviors.  Managing Moods and Relationships Clinical  staff led group instruction and group discussion with PowerPoint presentation and patient guidebook. To enhance the learning environment the use of posters, models and videos may be added. Patients will learn how emotional and chronic stress factors can impact their health and relationships. They will learn healthy ways to manage their moods and utilize positive coping mechanisms. In addition, ICR patients will learn ways to improve communication skills. The purpose of this lesson is to expose patients to ways of understanding how one's mood and health are intimately connected. Developing a healthy outlook can help build positive relationships and connections with others. Patients will understand the importance of utilizing effective communication skills that include actively listening and being heard. They will learn and understand the importance of the "4 Cs" and especially Connections in fostering of a Healthy Mind-Set.  Healthy Sleep for a Healthy Heart Clinical staff led group instruction and group discussion with PowerPoint presentation and patient guidebook. To enhance the learning environment the use of posters, models and videos may be added. At the conclusion of this workshop, patients will be able to demonstrate knowledge of the importance of sleep to overall health, well-being, and quality of life. They will understand the symptoms of, and treatments for, common sleep disorders. Patients will also be able to identify daytime and nighttime behaviors which impact sleep, and  they will be able to apply these tools to help manage sleep-related challenges. The purpose of this lesson is to provide patients with a general overview of sleep and outline the importance of quality sleep. Patients will learn about a few of the most common sleep disorders. Patients will also be introduced to the concept of "sleep hygiene," and discover ways to self-manage certain sleeping problems through simple daily behavior  changes. Finally, the workshop will motivate patients by clarifying the links between quality sleep and their goals of heart-healthy living.   Recognizing and Reducing Stress Clinical staff led group instruction and group discussion with PowerPoint presentation and patient guidebook. To enhance the learning environment the use of posters, models and videos may be added. At the conclusion of this workshop, patients will be able to understand the types of stress reactions, differentiate between acute and chronic stress, and recognize the impact that chronic stress has on their health. They will also be able to apply different coping mechanisms, such as reframing negative self-talk. Patients will have the opportunity to practice a variety of stress management techniques, such as deep abdominal breathing, progressive muscle relaxation, and/or guided imagery.  The purpose of this lesson is to educate patients on the role of stress in their lives and to provide healthy techniques for coping with it.  Learning Barriers/Preferences:  Learning Barriers/Preferences - 12/21/21 1229       Learning Barriers/Preferences   Learning Barriers Sight   wears reading glasses   Learning Preferences Skilled Demonstration;Individual Instruction;Group Instruction             Education Topics:  Knowledge Questionnaire Score:  Knowledge Questionnaire Score - 12/21/21 1229       Knowledge Questionnaire Score   Pre Score 22/24             Core Components/Risk Factors/Patient Goals at Admission:  Personal Goals and Risk Factors at Admission - 12/21/21 1231       Core Components/Risk Factors/Patient Goals on Admission    Weight Management Yes;Obesity;Weight Loss    Intervention Weight Management: Develop a combined nutrition and exercise program designed to reach desired caloric intake, while maintaining appropriate intake of nutrient and fiber, sodium and fats, and appropriate energy expenditure required  for the weight goal.;Weight Management: Provide education and appropriate resources to help participant work on and attain dietary goals.;Weight Management/Obesity: Establish reasonable short term and long term weight goals.;Obesity: Provide education and appropriate resources to help participant work on and attain dietary goals.    Admit Weight 238 lb 15.7 oz (108.4 kg)    Expected Outcomes Short Term: Continue to assess and modify interventions until short term weight is achieved;Long Term: Adherence to nutrition and physical activity/exercise program aimed toward attainment of established weight goal;Understanding recommendations for meals to include 15-35% energy as protein, 25-35% energy from fat, 35-60% energy from carbohydrates, less than '200mg'$  of dietary cholesterol, 20-35 gm of total fiber daily;Understanding of distribution of calorie intake throughout the day with the consumption of 4-5 meals/snacks    Diabetes Yes    Intervention Provide education about signs/symptoms and action to take for hypo/hyperglycemia.;Provide education about proper nutrition, including hydration, and aerobic/resistive exercise prescription along with prescribed medications to achieve blood glucose in normal ranges: Fasting glucose 65-99 mg/dL    Expected Outcomes Short Term: Participant verbalizes understanding of the signs/symptoms and immediate care of hyper/hypoglycemia, proper foot care and importance of medication, aerobic/resistive exercise and nutrition plan for blood glucose control.;Long Term: Attainment of HbA1C < 7%.  Heart Failure Yes    Intervention Provide a combined exercise and nutrition program that is supplemented with education, support and counseling about heart failure. Directed toward relieving symptoms such as shortness of breath, decreased exercise tolerance, and extremity edema.    Expected Outcomes Short term: Attendance in program 2-3 days a week with increased exercise capacity. Reported  lower sodium intake. Reported increased fruit and vegetable intake. Reports medication compliance.;Short term: Daily weights obtained and reported for increase. Utilizing diuretic protocols set by physician.;Long term: Adoption of self-care skills and reduction of barriers for early signs and symptoms recognition and intervention leading to self-care maintenance.    Hypertension Yes    Intervention Provide education on lifestyle modifcations including regular physical activity/exercise, weight management, moderate sodium restriction and increased consumption of fresh fruit, vegetables, and low fat dairy, alcohol moderation, and smoking cessation.;Monitor prescription use compliance.    Expected Outcomes Short Term: Continued assessment and intervention until BP is < 140/55m HG in hypertensive participants. < 130/8102mHG in hypertensive participants with diabetes, heart failure or chronic kidney disease.;Long Term: Maintenance of blood pressure at goal levels.    Lipids Yes    Intervention Provide education and support for participant on nutrition & aerobic/resistive exercise along with prescribed medications to achieve LDL '70mg'$ , HDL >'40mg'$ .    Expected Outcomes Short Term: Participant states understanding of desired cholesterol values and is compliant with medications prescribed. Participant is following exercise prescription and nutrition guidelines.;Long Term: Cholesterol controlled with medications as prescribed, with individualized exercise RX and with personalized nutrition plan. Value goals: LDL < '70mg'$ , HDL > 40 mg.             Core Components/Risk Factors/Patient Goals Review:   Goals and Risk Factor Review     Row Name 12/25/21 1601 01/16/22 0943           Core Components/Risk Factors/Patient Goals Review   Personal Goals Review Weight Management/Obesity;Heart Failure;Hypertension;Lipids;Diabetes;Stress Weight Management/Obesity;Heart Failure;Hypertension;Lipids;Diabetes;Stress       Review DaLinna Hofftarted exercise at intensive cardiac rehab on 12/25/21. [name] did well with exercise. Max exertional BP 160/77 on the nustep. CBGs's stable. Will continue to montior [name] started exercise at intensive cardiac rehab on 12/25/21. [name]'s vital signs and CBG's have been stable. DaLinna Hoffas lost 2.6 kg since starting the program.      Expected Outcomes DaLinna Hoffill continue to participate in intensive cardiac rehab for exercise, nutrition and lifestyle modifications DaLinna Hoffill continue to participate in intensive cardiac rehab for exercise, nutrition and lifestyle modifications               Core Components/Risk Factors/Patient Goals at Discharge (Final Review):   Goals and Risk Factor Review - 01/16/22 0943       Core Components/Risk Factors/Patient Goals Review   Personal Goals Review Weight Management/Obesity;Heart Failure;Hypertension;Lipids;Diabetes;Stress    Review DaLinna Hofftarted exercise at intensive cardiac rehab on 12/25/21. [name]'s vital signs and CBG's have been stable. DaLinna Hoffas lost 2.6 kg since starting the program.    Expected Outcomes DaLinna Hoffill continue to participate in intensive cardiac rehab for exercise, nutrition and lifestyle modifications             ITP Comments:  ITP Comments     Row Name 12/21/21 1052 12/25/21 1555 01/16/22 0933       ITP Comments Dr TrFransico HimD, Medical Director. Introduction to [facility] Education/ Intensive Cardiac Rehab. Inital Orientation Packet reviewed with the patient 30 day ITP Review. [name] started intensive cardiac rehab on 12/25/21.  [name] did well with exercise 30 day ITP Review. [name] started intensive cardiac rehab on 12/25/21 and is off to a good start to exercise. Linna Hoff may benefit from [facility] as he recently fell going into his home when he completes intensvie cardiac rehab              Comments: See ITP Comments

## 2022-01-17 ENCOUNTER — Encounter (HOSPITAL_COMMUNITY)
Admission: RE | Admit: 2022-01-17 | Discharge: 2022-01-17 | Disposition: A | Discharge status: Home / Self Care | Payer: Medicare PPO | Source: Ambulatory Visit | Attending: Cardiovascular Disease | Admitting: Cardiovascular Disease

## 2022-01-17 DIAGNOSIS — Z952 Presence of prosthetic heart valve: Secondary | ICD-10-CM | POA: Diagnosis not present

## 2022-01-17 DIAGNOSIS — Z48812 Encounter for surgical aftercare following surgery on the circulatory system: Secondary | ICD-10-CM | POA: Diagnosis not present

## 2022-01-19 ENCOUNTER — Encounter (HOSPITAL_COMMUNITY): Payer: Medicare PPO

## 2022-01-19 ENCOUNTER — Telehealth (HOSPITAL_COMMUNITY): Payer: Self-pay | Admitting: Geriatric Medicine

## 2022-01-22 ENCOUNTER — Encounter (HOSPITAL_COMMUNITY): Payer: Medicare PPO

## 2022-01-24 ENCOUNTER — Encounter (HOSPITAL_COMMUNITY): Payer: Medicare PPO

## 2022-01-26 ENCOUNTER — Encounter (HOSPITAL_COMMUNITY): Payer: Medicare PPO

## 2022-01-29 ENCOUNTER — Encounter (HOSPITAL_COMMUNITY)
Admission: RE | Admit: 2022-01-29 | Discharge: 2022-01-29 | Disposition: A | Discharge status: Home / Self Care | Payer: Medicare PPO | Source: Ambulatory Visit | Attending: Cardiovascular Disease | Admitting: Cardiovascular Disease

## 2022-01-29 DIAGNOSIS — Z952 Presence of prosthetic heart valve: Secondary | ICD-10-CM | POA: Insufficient documentation

## 2022-01-29 DIAGNOSIS — Z48812 Encounter for surgical aftercare following surgery on the circulatory system: Secondary | ICD-10-CM | POA: Insufficient documentation

## 2022-01-31 ENCOUNTER — Encounter (HOSPITAL_COMMUNITY)
Admission: RE | Admit: 2022-01-31 | Discharge: 2022-01-31 | Disposition: A | Discharge status: Home / Self Care | Payer: Medicare PPO | Source: Ambulatory Visit | Attending: Cardiovascular Disease | Admitting: Cardiovascular Disease

## 2022-01-31 DIAGNOSIS — Z48812 Encounter for surgical aftercare following surgery on the circulatory system: Secondary | ICD-10-CM | POA: Diagnosis not present

## 2022-01-31 DIAGNOSIS — Z952 Presence of prosthetic heart valve: Secondary | ICD-10-CM

## 2022-02-02 ENCOUNTER — Encounter (HOSPITAL_COMMUNITY)
Admission: RE | Admit: 2022-02-02 | Discharge: 2022-02-02 | Disposition: A | Discharge status: Home / Self Care | Payer: Medicare PPO | Source: Ambulatory Visit | Attending: Cardiovascular Disease | Admitting: Cardiovascular Disease

## 2022-02-02 DIAGNOSIS — Z48812 Encounter for surgical aftercare following surgery on the circulatory system: Secondary | ICD-10-CM | POA: Diagnosis not present

## 2022-02-02 DIAGNOSIS — Z952 Presence of prosthetic heart valve: Secondary | ICD-10-CM | POA: Diagnosis not present

## 2022-02-05 ENCOUNTER — Encounter (HOSPITAL_COMMUNITY)
Admission: RE | Admit: 2022-02-05 | Discharge: 2022-02-05 | Disposition: A | Discharge status: Home / Self Care | Payer: Medicare PPO | Source: Ambulatory Visit | Attending: Cardiovascular Disease | Admitting: Cardiovascular Disease

## 2022-02-05 DIAGNOSIS — Z48812 Encounter for surgical aftercare following surgery on the circulatory system: Secondary | ICD-10-CM | POA: Diagnosis not present

## 2022-02-05 DIAGNOSIS — Z952 Presence of prosthetic heart valve: Secondary | ICD-10-CM | POA: Diagnosis not present

## 2022-02-06 NOTE — Progress Notes (Signed)
Cardiac Individual Treatment Plan  Patient Details  Name: Tony [name] MRN: [id number] Date of Birth: Jul 11, 1950 Referring Provider:   Flowsheet Row INTENSIVE CARDIAC REHAB ORIENT from 12/21/2021 in [place]  Referring Provider [name], MD       Initial Encounter Date:  [facility] from 12/21/2021 in [place] [facility]  Date 12/21/21       Visit Diagnosis: 08/29/21 S/P TAVR   Patient's Home Medications on Admission:  Current Outpatient Medications:    acetaminophen (TYLENOL) 500 MG tablet, Take 500 mg by mouth every 6 (six) hours as needed for moderate pain (pain)., Disp: , Rfl:    amoxicillin (AMOXIL) 500 MG tablet, Take 4 tablets (2,000 mg total) by mouth as directed. 1 hour prior to dental work including cleanings, Disp: 12 tablet, Rfl: 12   aspirin EC 81 MG tablet, Take 81 mg by mouth daily., Disp: , Rfl:    atorvastatin (LIPITOR) 40 MG tablet, Take 40 mg by mouth daily., Disp: , Rfl:    BD PEN NEEDLE NANO 2ND GEN 32G X 4 MM MISC, , Disp: , Rfl:    canagliflozin (INVOKANA) 300 MG TABS tablet, Take 300 mg by mouth daily before breakfast., Disp: , Rfl:    carvedilol (COREG) 3.125 MG tablet, Take 1 tablet (3.125 mg total) by mouth 2 (two) times daily with a meal., Disp: 180 tablet, Rfl: 2   Continuous Blood Gluc Receiver (FREESTYLE LIBRE 2 READER) DEVI, by Does not apply route., Disp: , Rfl:    cyclobenzaprine (FLEXERIL) 10 MG tablet, Take 10 mg by mouth 3 (three) times daily as needed for muscle spasms., Disp: , Rfl:    Dulaglutide (TRULICITY) 3 JS/9.7WY SOPN, Inject 3 mg into the skin every Friday., Disp: , Rfl:    famotidine (PEPCID) 20 MG tablet, Take 20 mg by mouth daily., Disp: , Rfl:    gabapentin (NEURONTIN) 100 MG capsule, Take 2 capsules (200 mg total) by mouth at bedtime. (Patient taking differently: Take 100 mg by mouth at bedtime.), Disp: 60 capsule, Rfl: 2   Insulin  Aspart FlexPen (NOVOLOG) 100 UNIT/ML, Inject 5 Units into the skin daily., Disp: , Rfl:    insulin glargine (LANTUS SOLOSTAR) 100 UNIT/ML Solostar Pen, Inject 40 Units into the skin 2 (two) times daily., Disp: , Rfl:    Lancets (ONETOUCH DELICA PLUS [id number]) MISC, Apply topically., Disp: , Rfl:    levothyroxine (SYNTHROID) 200 MCG tablet, Take 200 mcg by mouth daily before breakfast., Disp: , Rfl:    meloxicam (MOBIC) 7.5 MG tablet, Take 7.5 mg by mouth daily as needed for pain., Disp: , Rfl:    sacubitril-valsartan (ENTRESTO) 49-51 MG, Take 1 tablet by mouth 2 (two) times daily., Disp: , Rfl:    sertraline (ZOLOFT) 100 MG tablet, Take 200 mg by mouth daily. , Disp: , Rfl:    Tamsulosin HCl (FLOMAX) 0.4 MG CAPS, Take 1 capsule (0.4 mg total) by mouth every morning., Disp: 30 capsule, Rfl: 0  Past Medical History: Past Medical History:  Diagnosis Date   Cancer ([facility])    thyroid   CHF (congestive heart failure) (HCC)    Complete heart block (HCC)    a. 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 Burt Knack and Dr. Cyndia Bent   Severe aortic stenosis    Sleep apnea     Tobacco Use: Social History   Tobacco Use  Smoking Status Never  Smokeless Tobacco Never    Labs: Review Flowsheet       Latest Ref Rng & Units 07/19/2011 08/07/2021 08/25/2021 08/29/2021  Labs for ITP Cardiac and Pulmonary Rehab  Hemoglobin A1c 4.8 - 5.6 % 7.1  - 7.5  -  PH, Arterial 7.35 - 7.45 - 7.376  - -  PCO2 arterial 32 - 48 mmHg - 45.1  - -  Bicarbonate 20.0 - 28.0 mmol/L 20.0 - 28.0 mmol/L - 28.5  26.4  - -  TCO2 22 - 32 mmol/L - 30  28   - 23  26   O2 Saturation % % - 62  92  - -    Capillary Blood Glucose: Lab Results  Component Value Date   GLUCAP 174 (H) 01/08/2022   GLUCAP 177 (H) 01/03/2022   GLUCAP 204 (H) 12/27/2021   GLUCAP 208 (H) 12/25/2021   GLUCAP 206 (H) 12/25/2021     Exercise Target Goals: Exercise Program Goal: Individual exercise prescription set using results from initial 6 min walk test and THRR while considering  patient's activity barriers and safety.   Exercise Prescription Goal: Initial exercise prescription builds to 30-45 minutes a day of aerobic activity, 2-3 days per week.  Home exercise guidelines will be given to patient during program as part of exercise prescription that the participant will acknowledge.  Activity Barriers & Risk Stratification:  Activity Barriers & Cardiac Risk Stratification - 12/21/21 1236       Activity Barriers & Cardiac Risk Stratification   Activity Barriers Back Problems;Neck/Spine Problems;Joint Problems;Deconditioning;Muscular Weakness;Decreased Ventricular Function;Balance Concerns;Assistive Device   Voices using cane on uneven surfaces. Bilateral diabetic neuropathy in the feet.   Cardiac Risk Stratification High             6 Minute Walk:  6 Minute Walk     Row Name 12/21/21 1233         6 Minute Walk   Phase Initial     Distance 1342 feet     Walk Time 6 minutes     # of Rest Breaks 0     MPH 2.6     METS 2.68     RPE 11     Perceived Dyspnea  0     VO2 Peak 9.37     Symptoms Yes (comment)     Comments Aching in the hips, denies pain     Resting HR 66 bpm     Resting BP 134/70     Resting Oxygen Saturation  97 %     Exercise Oxygen Saturation  during 6 min walk 97 %     Max Ex. HR 97 bpm     Max Ex. BP 148/80     2 Minute Post BP 140/80              Oxygen Initial Assessment:   Oxygen Re-Evaluation:   Oxygen Discharge (Final Oxygen Re-Evaluation):   Initial Exercise Prescription:  Initial Exercise  Prescription - 12/21/21 1200       Date of Initial Exercise RX and Referring Provider   Date 12/21/21  Referring Provider [name], MD    Expected Discharge Date 02/16/22      NuStep   Level 2    SPM 75    Minutes 15    METs 2.7      Arm Ergometer   Level 1.5    RPM 60    Minutes 15    METs 2.7      Prescription Details   Frequency (times per week) 3    Duration Progress to 30 minutes of continuous aerobic without signs/symptoms of physical distress      Intensity   THRR 40-80% of Max Heartrate 59-118    Ratings of Perceived Exertion 11-13    Perceived Dyspnea 0-4      Progression   Progression Continue progressive overload as per policy without signs/symptoms or physical distress.      Resistance Training   Training Prescription Yes    Weight 3 lbs    Reps 10-15             Perform Capillary Blood Glucose checks as needed.  Exercise Prescription Changes:   Exercise Prescription Changes     Row Name 12/25/21 1030 01/03/22 1028 01/15/22 1023 01/29/22 1026       Response to Exercise   Blood Pressure (Admit) 118/72 114/72 132/84 118/84    Blood Pressure (Exercise) 160/77 138/62 160/88 134/64    Blood Pressure (Exit) 132/74 112/72 134/70 122/68    Heart Rate (Admit) 66 bpm 61 bpm 69 bpm 76 bpm    Heart Rate (Exercise) 84 bpm 83 bpm 78 bpm 89 bpm    Heart Rate (Exit) 55 bpm -- 73 bpm 73 bpm    Rating of Perceived Exertion (Exercise) '12 12 11 13    '$ Symptoms None None None None    Comments Off to a good start with exercise. -- -- --    Duration Continue with 30 min of aerobic exercise without signs/symptoms of physical distress. Continue with 30 min of aerobic exercise without signs/symptoms of physical distress. Continue with 30 min of aerobic exercise without signs/symptoms of physical distress. Continue with 30 min of aerobic exercise without signs/symptoms of physical distress.    Intensity THRR unchanged THRR unchanged THRR unchanged THRR unchanged       Progression   Progression Continue to progress workloads to maintain intensity without signs/symptoms of physical distress. Continue to progress workloads to maintain intensity without signs/symptoms of physical distress. Continue to progress workloads to maintain intensity without signs/symptoms of physical distress. Continue to progress workloads to maintain intensity without signs/symptoms of physical distress.    Average METs 1.8 1.8 2.2 2.1      Resistance Training   Training Prescription Yes No  Relaxation day, no weights. Yes Yes    Weight 3 lbs -- 5 lbs 3 lbs    Reps 10-15 -- 10-15 10-15    Time 10 Minutes -- 10 Minutes 10 Minutes      Interval Training   Interval Training No No No No      NuStep   Level '2 2 2 2    '$ SPM 81 81 85 85    Minutes '15 15 15 15    '$ METs 1.9 1.8 2.4 2.5      Arm Ergometer   Level 1.5 1.5 1.5 1.5    Watts 7 7 -- --    Minutes '15 15 15 15    '$ METs 1.7 1.7 2 1.8      Home Exercise Plan  Plans to continue exercise at -- -- [facility] (comment)  [facility] [employer]  [facility] [employer].    Frequency -- -- Add 2 additional days to program exercise sessions. Add 2 additional days to program exercise sessions.    Initial Home Exercises Provided -- -- 01/15/22 01/15/22             Exercise Comments:   Exercise Comments     Row Name 12/25/21 1133 01/03/22 1137 01/15/22 1058 01/29/22 1100     Exercise Comments Patient tolerated low intensity exercise without symptoms. Used chair for stability during warm-up/cool-down exercises. Reviewed THRR,METs, and goals with patient. Reviewed home exercise guidelines, METs, and goals with patient. Reviewed METs and goals with patient.             Exercise Goals and Review:   Exercise Goals     Row Name 12/21/21 1238             Exercise Goals   Increase Physical  Activity Yes       Intervention Provide advice, education, support and counseling about physical activity/exercise needs.;Develop an individualized exercise prescription for aerobic and resistive training based on initial evaluation findings, risk stratification, comorbidities and participant's personal goals.       Expected Outcomes Short Term: Attend rehab on a regular basis to increase amount of physical activity.;Long Term: Add in home exercise to make exercise part of routine and to increase amount of physical activity.;Long Term: Exercising regularly at least 3-5 days a week.       Increase Strength and Stamina Yes       Intervention Provide advice, education, support and counseling about physical activity/exercise needs.;Develop an individualized exercise prescription for aerobic and resistive training based on initial evaluation findings, risk stratification, comorbidities and participant's personal goals.       Expected Outcomes Long Term: Improve cardiorespiratory fitness, muscular endurance and strength as measured by increased METs and functional capacity (6MWT);Short Term: Perform resistance training exercises routinely during rehab and add in resistance training at home;Short Term: Increase workloads from initial exercise prescription for resistance, speed, and METs.       Able to understand and use rate of perceived exertion (RPE) scale Yes       Intervention Provide education and explanation on how to use RPE scale       Expected Outcomes Short Term: Able to use RPE daily in rehab to express subjective intensity level;Long Term:  Able to use RPE to guide intensity level when exercising independently       Knowledge and understanding of Target Heart Rate Range (THRR) Yes       Intervention Provide education and explanation of THRR including how the numbers were predicted and where they are located for reference       Expected Outcomes Short Term: Able to state/look up THRR;Short Term: Able  to use daily as guideline for intensity in rehab;Long Term: Able to use THRR to govern intensity when exercising independently       Understanding of Exercise Prescription Yes       Intervention Provide education, explanation, and written materials on patient's individual exercise prescription       Expected Outcomes Short Term: Able to explain program exercise prescription;Long Term: Able to explain home exercise prescription to exercise independently  Exercise Goals Re-Evaluation :  Exercise Goals Re-Evaluation     Row Name 12/25/21 1438 01/03/22 1137 01/15/22 1058 01/29/22 1100       Exercise Goal Re-Evaluation   Exercise Goals Review Increase Physical Activity;Able to understand and use rate of perceived exertion (RPE) scale Increase Physical Activity;Able to understand and use rate of perceived exertion (RPE) scale;Knowledge and understanding of Target Heart Rate Range (THRR);Able to check pulse independently Increase Physical Activity;Able to understand and use rate of perceived exertion (RPE) scale;Knowledge and understanding of Target Heart Rate Range (THRR);Able to check pulse independently;Increase Strength and Stamina;Understanding of Exercise Prescription Increase Physical Activity;Able to understand and use rate of perceived exertion (RPE) scale;Knowledge and understanding of Target Heart Rate Range (THRR);Able to check pulse independently;Increase Strength and Stamina;Understanding of Exercise Prescription    Comments Patient able to understand and use RPE scale appropriately. Making slow progress with exercise. Patient wants to know his THRR and to exercise in his target. Reviewed THRR with patient. Paitent is able to manually count his pulse. Patient is doing some walking at home, [facility] Will review home exericse prescription with patient next week. Reviewed exercise prescription with patient. Patient plans to exercise at local  fitness center, walking the track and using the equipment as his mode of home exercise. Patient's goal is to increase leg strength. Patient is able to monitor his pulse with exercise. Patient is walking ~10-15 minutes, 2 days/week at home in addition to exercise at cardiac rehab. Patient's goal is to be consistent with exercise routine.    Expected Outcomes Progress workloads as tolerated to help increase strength and stamina. Improve cardiorespiratory fitness. Continue to progress workloads as tolerated. Paitent will [facility]hab to achieve 150 minutes of aerobic exercise/week. Increase hand weights and workloads as tolerared to help increase strength. Continue to increase walking at home and maintain a consistent exercise routine.             Discharge Exercise Prescription (Final Exercise Prescription Changes):  Exercise Prescription Changes - 01/29/22 1026       Response to Exercise   Blood Pressure (Admit) 118/84    Blood Pressure (Exercise) 134/64    Blood Pressure (Exit) 122/68    Heart Rate (Admit) 76 bpm    Heart Rate (Exercise) 89 bpm    Heart Rate (Exit) 73 bpm    Rating of Perceived Exertion (Exercise) 13    Symptoms None    Duration Continue with 30 min of aerobic exercise without signs/symptoms of physical distress.    Intensity THRR unchanged      Progression   Progression Continue to progress workloads to maintain intensity without signs/symptoms of physical distress.    Average METs 2.1      Resistance Training   Training Prescription Yes    Weight 3 lbs    Reps 10-15    Time 10 Minutes      Interval Training   Interval Training No      NuStep   Level 2    SPM 85    Minutes 15    METs 2.5      Arm Ergometer   Level 1.5    Minutes 15    METs 1.8      Home Exercise Plan   Plans to continue exercise at [employer] [facility] [employer] (comment)   Patient plans to exercise at the [facility]  and is a member at [employer].   Frequency Add 2 additional days to program exercise sessions.    Initial Home Exercises Provided 01/15/22             Nutrition:  Target Goals: Understanding of nutrition guidelines, daily intake of sodium '1500mg'$ , cholesterol '200mg'$ , calories 30% from fat and 7% or less from saturated fats, daily to have 5 or more servings of fruits and vegetables.  Biometrics:  Pre Biometrics - 12/21/21 1005       Pre Biometrics   Waist Circumference 48 inches    Hip Circumference 48 inches    Waist to Hip Ratio 1 %    Triceps Skinfold 9 mm    % Body Fat 31 %    Grip Strength 46 kg    Flexibility 0 in   Unable to reach   Single Leg Stand 2.25 seconds              Nutrition Therapy Plan and Nutrition Goals:  Nutrition Therapy & Goals - 01/25/22 0852       Nutrition Therapy   Diet Heart Healthy/Carbohydrate Consistent Diet    Drug/Food Interactions Statins/Certain Fruits      Personal Nutrition Goals   Nutrition Goal Patient to identify strategies for decreasing cardiovascular risk by attending the [employer] lifestyle and nutrition education weekly classes.    Personal Goal #2 Patient to use the plate method as a guide for meal planning to include fruits, vegetables, whole grains, lean protein/plant protein, nonfat dairy as part of heart healthy lifestyle    Personal Goal #3 Patient to identify and limit daily food sources of refind carbohydrates, saturated fat, trans fat, and sodium    Personal Goal #4 Patient to reduce sodium intake '1500mg'$  per day    Comments Goals in progress. Linna Hoff has missed multiple cardiac rehab sessions and education sessions due to recent covid diagnosis. He does continue to eat out frequently. He does have increased awareness of recommended dietary changes including increased fiber intake, decreased saturated fat, and reading food labels for sodium and salt.      Intervention Plan   Intervention Prescribe, educate and counsel regarding  individualized specific dietary modifications aiming towards targeted core components such as weight, hypertension, lipid management, diabetes, heart failure and other comorbidities.;Nutrition handout(s) given to patient.    Expected Outcomes Short Term Goal: Understand basic principles of dietary content, such as calories, fat, sodium, cholesterol and nutrients.;Long Term Goal: Adherence to prescribed nutrition plan.             Nutrition Assessments:  MEDIFICTS Score Key: ?70 Need to make dietary changes  40-70 Heart Healthy Diet ? 40 Therapeutic Level Cholesterol Diet    Picture Your Plate Scores: <90 Unhealthy dietary pattern with much room for improvement. 41-50 Dietary pattern unlikely to meet recommendations for good health and room for improvement. 51-60 More healthful dietary pattern, with some room for improvement.  >60 Healthy dietary pattern, although there may be some specific behaviors that could be improved.    Nutrition Goals Re-Evaluation:  Nutrition Goals Re-Evaluation     [place] Name 12/25/21 1151 01/25/22 0852           Goals   Current Weight 236 lb 15.9 oz (107.5 kg) 238 lb 1.6 oz (108 kg)  weight from last attended session on 01/17/22      Comment A1c 7.5 No new labs at this time; most recent labs show A1c 7.5. He has maintained weight throughout time  in cardiac rehab.      Expected Outcome Patient reports motivation to reduce frequency of  eating out. He is currently seeing counseling for depression support. He reports good support from his children. Goals in progress. Linna Hoff has missed multiple cardiac rehab sessions and education sessions due to recent covid diagnosis. He does continue to eat out frequently. He does have increased awareness of recommended dietary changes including increased fiber intake, decreased saturated fat, and reading food labels for sodium and salt.               Nutrition Goals Re-Evaluation:  Nutrition Goals Re-Evaluation      [place] Name 12/25/21 1151 01/25/22 0852           Goals   Current Weight 236 lb 15.9 oz (107.5 kg) 238 lb 1.6 oz (108 kg)  weight from last attended session on 01/17/22      Comment A1c 7.5 No new labs at this time; most recent labs show A1c 7.5. He has maintained weight throughout time in cardiac rehab.      Expected Outcome Patient reports motivation to reduce frequency of  eating out. He is currently seeing counseling for depression support. He reports good support from his children. Goals in progress. Linna Hoff has missed multiple cardiac rehab sessions and education sessions due to recent covid diagnosis. He does continue to eat out frequently. He does have increased awareness of recommended dietary changes including increased fiber intake, decreased saturated fat, and reading food labels for sodium and salt.               Nutrition Goals Discharge (Final Nutrition Goals Re-Evaluation):  Nutrition Goals Re-Evaluation - 01/25/22 6063       Goals   Current Weight 238 lb 1.6 oz (108 kg)   weight from last attended session on 01/17/22   Comment No new labs at this time; most recent labs show A1c 7.5. He has maintained weight throughout time in cardiac rehab.    Expected Outcome Goals in progress. Linna Hoff has missed multiple cardiac rehab sessions and education sessions due to recent covid diagnosis. He does continue to eat out frequently. He does have increased awareness of recommended dietary changes including increased fiber intake, decreased saturated fat, and reading food labels for sodium and salt.             Psychosocial: Target Goals: Acknowledge presence or absence of significant depression and/or stress, maximize coping skills, provide positive support system. Participant is able to verbalize types and ability to use techniques and skills needed for reducing stress and depression.  Initial Review & Psychosocial Screening:  Initial Psych Review & Screening - 12/21/21 1103        Initial Review   Current issues with History of Depression;Current Depression;Current Psychotropic Meds;Current Sleep Concerns      Family Dynamics   Good Support System? Yes   [name] lives alone. Linna Hoff has his 2 children for support who lives in the area. Linna Hoff also has his faith.   Comments Linna Hoff says he has been experiencing some depression since his good firend passed away last 03/31/2023. Linna Hoff is currently receiving couselling with a psychologist which has been very helpful. Linna Hoff is taking an antidepressant which he has for years      Barriers   Psychosocial barriers to participate in program The patient should benefit from training in stress management and relaxation.      Screening Interventions   Interventions Encouraged to exercise;To provide support and resources with  identified psychosocial needs    Expected Outcomes Long Term Goal: Stressors or current issues are controlled or eliminated.;Short Term goal: Identification and review with participant of any Quality of Life or Depression concerns found by scoring the questionnaire.;Long Term goal: The participant improves quality of Life and PHQ9 Scores as seen by post scores and/or verbalization of changes             Quality of Life Scores:  Quality of Life - 12/21/21 1227       Quality of Life   Select Quality of Life      Quality of Life Scores   Health/Function Pre 17.3 %    Socioeconomic Pre 21.83 %    Psych/Spiritual Pre 22.21 %    Family Pre 25.5 %    GLOBAL Pre 20.25 %            Scores of 19 and below usually indicate a poorer quality of life in these areas.  A difference of  2-3 points is a clinically meaningful difference.  A difference of 2-3 points in the total score of the Quality of Life Index has been associated with significant improvement in overall quality of life, self-image, physical symptoms, and general health in studies assessing change in quality of life.  PHQ-9: Review Flowsheet       12/21/2021  12/21/2015  Depression screen PHQ 2/9  Decreased Interest 1 0  Down, Depressed, Hopeless 1 0  PHQ - 2 Score 2 0  Altered sleeping 1 -  Tired, decreased energy 1 -  Change in appetite 1 -  Feeling bad or failure about yourself  0 -  Trouble concentrating 0 -  Moving slowly or fidgety/restless 0 -  PHQ-9 Score 5 -  Difficult doing work/chores Somewhat difficult -   Interpretation of Total Score  Total Score Depression Severity:  1-4 = Minimal depression, 5-9 = Mild depression, 10-14 = Moderate depression, 15-19 = Moderately severe depression, 20-27 = Severe depression   Psychosocial Evaluation and Intervention:   Psychosocial Re-Evaluation:  Psychosocial Re-Evaluation     [place] Name 12/25/21 1556 12/27/21 1130 02/06/22 1449         Psychosocial Re-Evaluation   Current issues with Current Stress Concerns;Current Sleep Concerns;History of Depression;Current Psychotropic Meds;Current Depression Current Stress Concerns;Current Sleep Concerns;History of Depression;Current Psychotropic Meds;Current Depression Current Stress Concerns;Current Sleep Concerns;History of Depression;Current Psychotropic Meds;Current Depression     Comments Linna Hoff started cardiac rehab today. Will review quality of life questionnaire in the upcoming week Reviewed quality of life questionnaire. Linna Hoff is dissatisfied with his health due to deconditioning. Timmothy Sours is currently receiving counselling which has been helpful for him. Linna Hoff has not voiced any increased stressors or concerns since participating in intensive cardiac rehab. Linna Hoff is currently receiving counselling which has been helpful for him.     Expected Outcomes Linna Hoff will have decreased or controlled depression upon completion of intensive cardiac rehab. Linna Hoff will have decreased or controlled depression upon completion of intensive cardiac rehab. Linna Hoff will have decreased or controlled depression upon completion of intensive cardiac rehab.     Interventions Encouraged to  attend Cardiac Rehabilitation for the exercise;Relaxation education;Stress management education Encouraged to attend Cardiac Rehabilitation for the exercise;Relaxation education;Stress management education Encouraged to attend Cardiac Rehabilitation for the exercise;Relaxation education;Stress management education     Continue Psychosocial Services  Follow up required by staff Follow up required by staff Follow up required by staff       Initial Review   Source of Stress  Concerns Chronic Illness Chronic Illness Chronic Illness     Comments Will continue to montior and offer support as needed Will continue to montior and offer support as needed Will continue to montior and offer support as needed              Psychosocial Discharge (Final Psychosocial Re-Evaluation):  Psychosocial Re-Evaluation - 02/06/22 1449       Psychosocial Re-Evaluation   Current issues with Current Stress Concerns;Current Sleep Concerns;History of Depression;Current Psychotropic Meds;Current Depression    Comments Linna Hoff has not voiced any increased stressors or concerns since participating in intensive cardiac rehab. Linna Hoff is currently receiving counselling which has been helpful for him.    Expected Outcomes Linna Hoff will have decreased or controlled depression upon completion of intensive cardiac rehab.    Interventions Encouraged to attend Cardiac Rehabilitation for the exercise;Relaxation education;Stress management education    Continue Psychosocial Services  Follow up required by staff      Initial Review   Source of Stress Concerns Chronic Illness    Comments Will continue to montior and offer support as needed             Vocational Rehabilitation: Provide vocational rehab assistance to qualifying candidates.   Vocational Rehab Evaluation & Intervention:  Vocational Rehab - 12/21/21 1107       Initial Vocational Rehab Evaluation & Intervention   Assessment shows need for Vocational Rehabilitation No    Linna Hoff is retired and does not need vocatinal rehab at this time            Education: Education Goals: Education classes will be provided on a weekly basis, covering required topics. Participant will state understanding/return demonstration of topics presented.    Education     [place] Name 12/25/21 1200     Education   Cardiac Education Topics Pritikin   Select Workshops     Workshops   Educator Exercise Physiologist   Select Exercise   Exercise Workshop Hotel manager and Fall Prevention   Instruction Review Code 1- Verbalizes Understanding   Class Start Time 1145   Class Stop Time 1229   Class Time Calculation (min) 44 min    [place] Name 01/03/22 1300     Education   Cardiac Education Topics Pritikin   Financial trader   Weekly Topic Tasty Appetizers and Snacks   Instruction Review Code 1- Verbalizes Understanding   Class Start Time 1144   Class Stop Time 1227   Class Time Calculation (min) 43 min    [place] Name 01/08/22 1400     Education   Cardiac Education Topics Pritikin   Lexicographer Nutrition   Nutrition Calorie Density   Instruction Review Code 1- Verbalizes Understanding   Class Start Time 1140   Class Stop Time 1230   Class Time Calculation (min) 50 min    [place] Name 01/10/22 1200     Education   Cardiac Education Topics [employer]   Educator Dietitian   Weekly Topic Delicious Desserts   Instruction Review Code 1- Verbalizes Understanding   Class Start Time 1130   Class Stop Time 1205   Class Time Calculation (min) 35 min    [place] Name 01/15/22 [place]   [employer]  Workshops   Dentist Exercise   Exercise Workshop Exercise Basics: Press photographer   Instruction Review Code 1- Verbalizes Understanding    Class Start Time 1140   Class Stop Time 1224   Class Time Calculation (min) 44 min    [place] Name 01/17/22 1400     Education   Cardiac Education Topics Pritikin   Financial trader   Weekly Topic Efficiency Cooking - Meals in a Snap   Instruction Review Code 1- Verbalizes Understanding   Class Start Time 1135   Class Stop Time 1210   Class Time Calculation (min) 35 min    [place] Name 01/29/22 1600     Education   Cardiac Education Topics Pritikin   Lexicographer Nutrition   Nutrition Dining Out - Part 1   Instruction Review Code 1- Verbalizes Understanding   Class Start Time 1137   Class Stop Time 1220   Class Time Calculation (min) 43 min    [place] Name 01/31/22 1300     Education   Cardiac Education Topics Pritikin   Financial trader   Weekly Topic One-Pot Wonders   Instruction Review Code 1- Verbalizes Understanding   Class Start Time 1142   Class Stop Time 1213   Class Time Calculation (min) 31 min    [place] Name 02/02/22 1200     Education   Cardiac Education Topics Pritikin   Environmental consultant Psychosocial   Psychosocial Workshop New Thoughts, New Behaviors   Instruction Review Code 1- Verbalizes Understanding   Class Start Time 1143   Class Stop Time 1225   Class Time Calculation (min) 42 min    [place] Name 02/05/22 1300     Education   Cardiac Education Topics Pritikin   Select Core Videos     Core Videos   Educator Exercise Physiologist   Select Exercise Education   Exercise Education Biomechanial Limitations   Instruction Review Code 1- Verbalizes Understanding   Class Start Time 1140   Class Stop Time 1216   Class Time Calculation (min) 36 min            Core Videos: Exercise    Move It!  Clinical staff conducted group or individual video  education with verbal and written material and guidebook.  Patient learns the recommended Pritikin exercise program. Exercise with the goal of living a long, healthy life. Some of the health benefits of exercise include controlled diabetes, healthier blood pressure levels, improved cholesterol levels, improved heart and lung capacity, improved sleep, and better body composition. Everyone should speak with their doctor before starting or changing an exercise routine.  Biomechanical Limitations Clinical staff conducted group or individual video education with verbal and written material and guidebook.  Patient learns how biomechanical limitations can impact exercise and how we can mitigate and possibly overcome limitations to have an impactful and balanced exercise routine.  Body Composition Clinical staff conducted group or individual video education with verbal and written material and guidebook.  Patient learns that body composition (ratio of muscle mass to fat mass) is a key component to assessing overall fitness, rather than body weight alone. Increased fat mass, especially visceral belly fat, can put [place] at  increased risk for metabolic syndrome, type 2 diabetes, heart disease, and even death. It is recommended to combine diet and exercise (cardiovascular and resistance training) to improve your body composition. Seek guidance from your physician and exercise physiologist before implementing an exercise routine.  Exercise Action Plan Clinical staff conducted group or individual video education with verbal and written material and guidebook.  Patient learns the recommended strategies to achieve and enjoy long-term exercise adherence, including variety, self-motivation, self-efficacy, and positive decision making. Benefits of exercise include fitness, good health, weight management, more energy, better sleep, less stress, and overall well-being.  Medical   Heart Disease Risk Reduction Clinical staff  conducted group or individual video education with verbal and written material and guidebook.  Patient learns our heart is our most vital organ as it circulates oxygen, nutrients, white blood cells, and hormones throughout the entire body, and carries waste away. Data supports a plant-based eating plan like the Pritikin Program for its effectiveness in slowing progression of and reversing heart disease. The video provides a number of recommendations to address heart disease.   Metabolic Syndrome and Belly Fat  Clinical staff conducted group or individual video education with verbal and written material and guidebook.  Patient learns what metabolic syndrome is, how it leads to heart disease, and how one can reverse it and keep it from coming back. You have metabolic syndrome if you have 3 of the following 5 criteria: abdominal obesity, high blood pressure, high triglycerides, low HDL cholesterol, and high blood sugar.  Hypertension and Heart Disease Clinical staff conducted group or individual video education with verbal and written material and guidebook.  Patient learns that high blood pressure, or hypertension, is very common in the [place]. Hypertension is largely due to excessive salt intake, but other important risk factors include being overweight, physical inactivity, drinking too much alcohol, smoking, and not eating enough potassium from fruits and vegetables. High blood pressure is a leading risk factor for heart attack, stroke, congestive heart failure, dementia, kidney failure, and premature death. Long-term effects of excessive salt intake include stiffening of the arteries and thickening of heart muscle and organ damage. Recommendations include ways to reduce hypertension and the risk of heart disease.  Diseases of Our Time - Focusing on Diabetes Clinical staff conducted group or individual video education with verbal and written material and guidebook.  Patient learns why the best  way to stop diseases of our time is prevention, through food and other lifestyle changes. Medicine (such as prescription pills and surgeries) is often only a Band-Aid on the problem, not a long-term solution. Most common diseases of our time include obesity, type 2 diabetes, hypertension, heart disease, and cancer. The Pritikin Program is recommended and has been proven to help reduce, reverse, and/or prevent the damaging effects of metabolic syndrome.  Nutrition   Overview of the Pritikin Eating Plan  Clinical staff conducted group or individual video education with verbal and written material and guidebook.  Patient learns about the [place] for disease risk reduction. The [place] emphasizes a wide variety of unrefined, minimally-processed carbohydrates, like fruits, vegetables, whole grains, and legumes. Go, Caution, and Stop food choices are explained. Plant-based and lean animal proteins are emphasized. Rationale provided for low sodium intake for blood pressure control, low added sugars for blood sugar stabilization, and low added fats and oils for coronary artery disease risk reduction and weight management.  Calorie Density  Clinical staff conducted group or individual video education with verbal  and written material and guidebook.  Patient learns about calorie density and how it impacts the Pritikin Eating Plan. Knowing the characteristics of the food you choose will help you decide whether those foods will lead to weight gain or weight loss, and whether you want to consume more or less of them. Weight loss is usually a side effect of the Pritikin Eating Plan because of its focus on low calorie-dense foods.  Label Reading  Clinical staff conducted group or individual video education with verbal and written material and guidebook.  Patient learns about the Pritikin recommended label reading guidelines and corresponding recommendations regarding calorie density, added  sugars, sodium content, and whole grains.  Dining Out - Part 1  Clinical staff conducted group or individual video education with verbal and written material and guidebook.  Patient learns that restaurant meals can be sabotaging because they can be so high in calories, fat, sodium, and/or sugar. Patient learns recommended strategies on how to positively address this and avoid unhealthy pitfalls.  Facts on Fats  Clinical staff conducted group or individual video education with verbal and written material and guidebook.  Patient learns that lifestyle modifications can be just as effective, if not more so, as many medications for lowering your risk of heart disease. A Pritikin lifestyle can help to reduce your risk of inflammation and atherosclerosis (cholesterol build-up, or plaque, in the artery walls). Lifestyle interventions such as dietary choices and physical activity address the cause of atherosclerosis. A review of the types of fats and their impact on blood cholesterol levels, along with dietary recommendations to reduce fat intake is also included.  Nutrition Action Plan  Clinical staff conducted group or individual video education with verbal and written material and guidebook.  Patient learns how to incorporate Pritikin recommendations into their lifestyle. Recommendations include planning and keeping personal health goals in mind as an important part of their success.  Healthy Mind-Set    Healthy Minds, Bodies, Hearts  Clinical staff conducted group or individual video education with verbal and written material and guidebook.  Patient learns how to identify when they are stressed. Video will discuss the impact of that stress, as well as the many benefits of stress management. Patient will also be introduced to stress management techniques. The way we think, act, and feel has an impact on our hearts.  How Our Thoughts Can Heal Our Hearts  Clinical staff conducted group or individual  video education with verbal and written material and guidebook.  Patient learns that negative thoughts can cause depression and anxiety. This can result in negative lifestyle behavior and serious health problems. Cognitive behavioral therapy is an effective method to help control our thoughts in order to change and improve our emotional outlook.  Additional Videos:  Exercise    Improving Performance  Clinical staff conducted group or individual video education with verbal and written material and guidebook.  Patient learns to use a non-linear approach by alternating intensity levels and lengths of time spent exercising to help burn more calories and lose more body fat. Cardiovascular exercise helps improve heart health, metabolism, hormonal balance, blood sugar control, and recovery from fatigue. Resistance training improves strength, endurance, balance, coordination, reaction time, metabolism, and muscle mass. Flexibility exercise improves circulation, posture, and balance. Seek guidance from your physician and exercise physiologist before implementing an exercise routine and learn your capabilities and proper form for all exercise.  Introduction to Yoga  Clinical staff conducted group or individual video education with verbal and written material  and guidebook.  Patient learns about yoga, a discipline of the coming together of mind, breath, and body. The benefits of yoga include improved flexibility, improved range of motion, better posture and core strength, increased lung function, weight loss, and positive self-image. Yoga's heart health benefits include lowered blood pressure, healthier heart rate, decreased cholesterol and triglyceride levels, improved immune function, and reduced stress. Seek guidance from your physician and exercise physiologist before implementing an exercise routine and learn your capabilities and proper form for all exercise.  Medical   Aging: Enhancing Your Quality of  Life  Clinical staff conducted group or individual video education with verbal and written material and guidebook.  Patient learns key strategies and recommendations to stay in good physical health and enhance quality of life, such as prevention strategies, having an advocate, securing a [place], and keeping a list of medications and system for tracking them. It also discusses how to avoid risk for bone loss.  Biology of Weight Control  Clinical staff conducted group or individual video education with verbal and written material and guidebook.  Patient learns that weight gain occurs because we consume more calories than we burn (eating more, moving less). Even if your body weight is normal, you may have higher ratios of fat compared to muscle mass. Too much body fat puts you at increased risk for cardiovascular disease, heart attack, stroke, type 2 diabetes, and obesity-related cancers. In addition to exercise, following the [place] can help reduce your risk.  Decoding Lab Results  Clinical staff conducted group or individual video education with verbal and written material and guidebook.  Patient learns that lab test reflects one measurement whose values change over time and are influenced by many factors, including medication, stress, sleep, exercise, food, hydration, pre-existing medical conditions, and more. It is recommended to use the knowledge from this video to become more involved with your lab results and evaluate your numbers to speak with your doctor.   Diseases of Our Time - Overview  Clinical staff conducted group or individual video education with verbal and written material and guidebook.  Patient learns that according to the CDC, 50% to 70% of chronic diseases (such as obesity, type 2 diabetes, elevated lipids, hypertension, and heart disease) are avoidable through lifestyle improvements including healthier food choices, listening to  satiety cues, and increased physical activity.  Sleep Disorders Clinical staff conducted group or individual video education with verbal and written material and guidebook.  Patient learns how good quality and duration of sleep are important to overall health and well-being. Patient also learns about sleep disorders and how they impact health along with recommendations to address them, including discussing with a physician.  Nutrition  Dining Out - Part 2 Clinical staff conducted group or individual video education with verbal and written material and guidebook.  Patient learns how to plan ahead and communicate in order to maximize their dining experience in a healthy and nutritious manner. Included are recommended food choices based on the type of restaurant the patient is visiting.   Fueling a Best boy conducted group or individual video education with verbal and written material and guidebook.  There is a strong connection between our food choices and our health. Diseases like obesity and type 2 diabetes are very prevalent and are in large-part due to lifestyle choices. The Pritikin Eating Plan provides plenty of food and hunger-curbing satisfaction. It is easy to follow, affordable, and helps reduce health  risks.  Menu Workshop  Clinical staff conducted group or individual video education with verbal and written material and guidebook.  Patient learns that restaurant meals can sabotage health goals because they are often packed with calories, fat, sodium, and sugar. Recommendations include strategies to plan ahead and to communicate with the manager, chef, or server to help order a healthier meal.  Planning Your Eating Strategy  Clinical staff conducted group or individual video education with verbal and written material and guidebook.  Patient learns about the [place] and its benefit of reducing the risk of disease. The [place] does not focus on  calories. Instead, it emphasizes high-quality, nutrient-rich foods. By knowing the characteristics of the foods, we choose, we can determine their calorie density and make informed decisions.  Targeting Your Nutrition Priorities  Clinical staff conducted group or individual video education with verbal and written material and guidebook.  Patient learns that lifestyle habits have a tremendous impact on disease risk and progression. This video provides eating and physical activity recommendations based on your personal health goals, such as reducing LDL cholesterol, losing weight, preventing or controlling type 2 diabetes, and reducing high blood pressure.  Vitamins and Minerals  Clinical staff conducted group or individual video education with verbal and written material and guidebook.  Patient learns different ways to obtain key vitamins and minerals, including through a recommended healthy diet. It is important to discuss all supplements you take with your doctor.   Healthy Mind-Set    Smoking Cessation  Clinical staff conducted group or individual video education with verbal and written material and guidebook.  Patient learns that cigarette smoking and tobacco addiction pose a serious health risk which affects millions of people. Stopping smoking will significantly reduce the risk of heart disease, lung disease, and many forms of cancer. Recommended strategies for quitting are covered, including working with your doctor to develop a successful plan.  Culinary   Becoming a Financial trader conducted group or individual video education with verbal and written material and guidebook.  Patient learns that cooking at home can be healthy, cost-effective, quick, and puts them in control. Keys to cooking healthy recipes will include looking at your recipe, assessing your equipment needs, planning ahead, making it simple, choosing cost-effective seasonal ingredients, and limiting the use of  added fats, salts, and sugars.  Cooking - Breakfast and Snacks  Clinical staff conducted group or individual video education with verbal and written material and guidebook.  Patient learns how important breakfast is to satiety and nutrition through the entire day. Recommendations include key foods to eat during breakfast to help stabilize blood sugar levels and to prevent overeating at meals later in the day. Planning ahead is also a key component.  Cooking - Human resources officer conducted group or individual video education with verbal and written material and guidebook.  Patient learns eating strategies to improve overall health, including an approach to cook more at home. Recommendations include thinking of animal protein as a side on your plate rather than center stage and focusing instead on lower calorie dense options like vegetables, fruits, whole grains, and plant-based proteins, such as beans. Making sauces in large quantities to freeze for later and leaving the skin on your vegetables are also recommended to maximize your experience.  Cooking - Healthy Salads and Dressing Clinical staff conducted group or individual video education with verbal and written material and guidebook.  Patient learns that vegetables, fruits, whole grains,  and legumes are the foundations of the Pritikin Eating Plan. Recommendations include how to incorporate each of these in flavorful and healthy salads, and how to create homemade salad dressings. Proper handling of ingredients is also covered. Cooking - Soups and [employer] - Soups and Desserts Clinical staff conducted group or individual video education with verbal and written material and guidebook.  Patient learns that Pritikin soups and desserts make for easy, nutritious, and delicious snacks and meal components that are low in sodium, fat, sugar, and calorie density, while high in vitamins, minerals, and filling fiber. Recommendations  include simple and healthy ideas for soups and desserts.   Overview     The Pritikin Solution Program Overview Clinical staff conducted group or individual video education with verbal and written material and guidebook.  Patient learns that the results of the [place] Program have been documented in more than 100 articles published in peer-reviewed journals, and the benefits include reducing risk factors for (and, in some cases, even reversing) high cholesterol, high blood pressure, type 2 diabetes, obesity, and more! An overview of the three key pillars of the Pritikin Program will be covered: eating well, doing regular exercise, and having a healthy mind-set.  WORKSHOPS  Exercise: Exercise Basics: Building Your Action Plan Clinical staff led group instruction and group discussion with PowerPoint presentation and patient guidebook. To enhance the learning environment the use of posters, models and videos may be added. At the conclusion of this workshop, patients will comprehend the difference between physical activity and exercise, as well as the benefits of incorporating both, into their routine. Patients will understand the FITT (Frequency, Intensity, Time, and Type) principle and how to use it to build an exercise action plan. In addition, safety concerns and other considerations for exercise and cardiac rehab will be addressed by the presenter. The purpose of this lesson is to promote a comprehensive and effective weekly exercise routine in order to improve patients' overall level of fitness.   Managing Heart Disease: Your Path to a Healthier Heart Clinical staff led group instruction and group discussion with PowerPoint presentation and patient guidebook. To enhance the learning environment the use of posters, models and videos may be added.At the conclusion of this workshop, patients will understand the anatomy and physiology of the heart. Additionally, they will understand how  Pritikin's three pillars impact the risk factors, the progression, and the management of heart disease.  The purpose of this lesson is to provide a high-level overview of the heart, heart disease, and how the Pritikin lifestyle positively impacts risk factors.  Exercise Biomechanics Clinical staff led group instruction and group discussion with PowerPoint presentation and patient guidebook. To enhance the learning environment the use of posters, models and videos may be added. Patients will learn how the structural parts of their bodies function and how these functions impact their daily activities, movement, and exercise. Patients will learn how to promote a neutral spine, learn how to manage pain, and identify ways to improve their physical movement in order to promote healthy living. The purpose of this lesson is to expose patients to common physical limitations that impact physical activity. Participants will learn practical ways to adapt and manage aches and pains, and to minimize their effect on regular exercise. Patients will learn how to maintain good posture while sitting, walking, and lifting.  Balance Training and Fall Prevention  Clinical staff led group instruction and group discussion with PowerPoint presentation and patient guidebook. To enhance the learning environment the use  of posters, models and videos may be added. At the conclusion of this workshop, patients will understand the importance of their sensorimotor skills (vision, proprioception, and the vestibular system) in maintaining their ability to balance as they age. Patients will apply a variety of balancing exercises that are appropriate for their current level of function. Patients will understand the common causes for poor balance, possible solutions to these problems, and ways to modify their physical environment in order to minimize their fall risk. The purpose of this lesson is to teach patients about the  importance of maintaining balance as they age and ways to minimize their risk of falling.  WORKSHOPS   Nutrition:  Fueling a Scientist, research (physical sciences) led group instruction and group discussion with PowerPoint presentation and patient guidebook. To enhance the learning environment the use of posters, models and videos may be added. Patients will review the foundational principles of the [place] and understand what constitutes a serving size in each of the food groups. Patients will also learn Pritikin-friendly foods that are better choices when away from home and review make-ahead meal and snack options. Calorie density will be reviewed and applied to three nutrition priorities: weight maintenance, weight loss, and weight gain. The purpose of this lesson is to reinforce (in a group setting) the key concepts around what patients are recommended to eat and how to apply these guidelines when away from home by planning and selecting Pritikin-friendly options. Patients will understand how calorie density may be adjusted for different weight management goals.  Mindful Eating  Clinical staff led group instruction and group discussion with PowerPoint presentation and patient guidebook. To enhance the learning environment the use of posters, models and videos may be added. Patients will briefly review the concepts of the [place] and the importance of low-calorie dense foods. The concept of mindful eating will be introduced as well as the importance of paying attention to internal hunger signals. Triggers for non-hunger eating and techniques for dealing with triggers will be explored. The purpose of this lesson is to provide patients with the opportunity to review the basic principles of the [place], discuss the value of eating mindfully and how to measure internal cues of hunger and fullness using the Hunger Scale. Patients will also discuss reasons for non-hunger eating and  learn strategies to use for controlling emotional eating.  Targeting Your Nutrition Priorities Clinical staff led group instruction and group discussion with PowerPoint presentation and patient guidebook. To enhance the learning environment the use of posters, models and videos may be added. Patients will learn how to determine their genetic susceptibility to disease by reviewing their family history. Patients will gain insight into the importance of diet as part of an overall healthy lifestyle in mitigating the impact of genetics and other environmental insults. The purpose of this lesson is to provide patients with the opportunity to assess their personal nutrition priorities by looking at their family history, their own health history and current risk factors. Patients will also be able to discuss ways of prioritizing and modifying the [place] for their highest risk areas  Menu  Clinical staff led group instruction and group discussion with PowerPoint presentation and patient guidebook. To enhance the learning environment the use of posters, models and videos may be added. Using menus brought in from [employer], or printed from [employer], patients will apply the [place] dining out guidelines that were presented in the [employer] video. Patients  will also be able to practice these guidelines in a variety of provided scenarios. The purpose of this lesson is to provide patients with the opportunity to practice hands-on learning of the [place] with actual menus and practice scenarios.  Label Reading Clinical staff led group instruction and group discussion with PowerPoint presentation and patient guidebook. To enhance the learning environment the use of posters, models and videos may be added. Patients will review and discuss the Pritikin label reading guidelines presented in Pritikin's Label Reading Educational series video. Using fool  labels brought in from local grocery stores and markets, patients will apply the label reading guidelines and determine if the packaged food meet the Pritikin guidelines. The purpose of this lesson is to provide patients with the opportunity to review, discuss, and practice hands-on learning of the Pritikin Label Reading guidelines with actual packaged food labels. [place] Workshops are designed to teach patients ways to prepare quick, simple, and affordable recipes at home. The importance of nutrition's role in chronic disease risk reduction is reflected in its emphasis in the overall Pritikin program. By learning how to prepare essential core Pritikin Eating Plan recipes, patients will increase control over what they eat; be able to customize the flavor of foods without the use of added salt, sugar, or fat; and improve the quality of the food they consume. By learning a set of core recipes which are easily assembled, quickly prepared, and affordable, patients are more likely to prepare more healthy foods at home. These workshops focus on convenient breakfasts, simple entres, side dishes, and desserts which can be prepared with minimal effort and are consistent with nutrition recommendations for cardiovascular risk reduction. Cooking [employer] are taught by a Engineer, materials (RD) who has been trained by the [employer]. The chef or RD has a clear understanding of the importance of minimizing - if not completely eliminating - added fat, sugar, and sodium in recipes. Throughout the series of [place] Workshop sessions, patients will learn about healthy ingredients and efficient methods of cooking to build confidence in their capability to prepare    Cooking School weekly topics:  Adding Flavor- Sodium-Free  Fast and Healthy Breakfasts  Powerhouse Plant-Based Proteins  Satisfying Salads and Dressings  Simple Sides and  Sauces  International Cuisine-Spotlight on the [place] Zones  Delicious Desserts  Savory Soups  Efficiency Cooking - Meals in a Snap  Tasty Appetizers and Snacks  Comforting Weekend Breakfasts  One-Pot Wonders   Fast Evening Meals  Easy [place] (Psychosocial): New Thoughts, New Behaviors Clinical staff led group instruction and group discussion with PowerPoint presentation and patient guidebook. To enhance the learning environment the use of posters, models and videos may be added. Patients will learn and practice techniques for developing effective health and lifestyle goals. Patients will be able to effectively apply the goal setting process learned to develop at least one new personal goal.  The purpose of this lesson is to expose patients to a new skill set of behavior modification techniques such as techniques setting SMART goals, overcoming barriers, and achieving new thoughts and new behaviors.  Managing Moods and Relationships Clinical staff led group instruction and group discussion with PowerPoint presentation and patient guidebook. To enhance the learning environment the use of posters, models and videos may be added. Patients will learn how emotional and chronic stress factors can impact their health and  relationships. They will learn healthy ways to manage their moods and utilize positive coping mechanisms. In addition, ICR patients will learn ways to improve communication skills. The purpose of this lesson is to expose patients to ways of understanding how one's mood and health are intimately connected. Developing a healthy outlook can help build positive relationships and connections with others. Patients will understand the importance of utilizing effective communication skills that include actively listening and being heard. They will learn and understand the importance of the "4 Cs" and especially Connections in  fostering of a Healthy Mind-Set.  Healthy Sleep for a Healthy Heart Clinical staff led group instruction and group discussion with PowerPoint presentation and patient guidebook. To enhance the learning environment the use of posters, models and videos may be added. At the conclusion of this workshop, patients will be able to demonstrate knowledge of the importance of sleep to overall health, well-being, and quality of life. They will understand the symptoms of, and treatments for, common sleep disorders. Patients will also be able to identify daytime and nighttime behaviors which impact sleep, and they will be able to apply these tools to help manage sleep-related challenges. The purpose of this lesson is to provide patients with a general overview of sleep and outline the importance of quality sleep. Patients will learn about a few of the most common sleep disorders. Patients will also be introduced to the concept of "sleep hygiene," and discover ways to self-manage certain sleeping problems through simple daily behavior changes. Finally, the workshop will motivate patients by clarifying the links between quality sleep and their goals of heart-healthy living.   Recognizing and Reducing Stress Clinical staff led group instruction and group discussion with PowerPoint presentation and patient guidebook. To enhance the learning environment the use of posters, models and videos may be added. At the conclusion of this workshop, patients will be able to understand the types of stress reactions, differentiate between acute and chronic stress, and recognize the impact that chronic stress has on their health. They will also be able to apply different coping mechanisms, such as reframing negative self-talk. Patients will have the opportunity to practice a variety of stress management techniques, such as deep abdominal breathing, progressive muscle relaxation, and/or guided imagery.  The purpose of this lesson is to  educate patients on the role of stress in their lives and to provide healthy techniques for coping with it.  Learning Barriers/Preferences:  Learning Barriers/Preferences - 12/21/21 1229       Learning Barriers/Preferences   Learning Barriers Sight   wears reading glasses   Learning Preferences Skilled Demonstration;Individual Instruction;Group Instruction             Education Topics:  Knowledge Questionnaire Score:  Knowledge Questionnaire Score - 12/21/21 1229       Knowledge Questionnaire Score   Pre Score 22/24             Core Components/Risk Factors/Patient Goals at Admission:  Personal Goals and Risk Factors at Admission - 12/21/21 1231       Core Components/Risk Factors/Patient Goals on Admission    Weight Management Yes;Obesity;Weight Loss    Intervention Weight Management: Develop a combined nutrition and exercise program designed to reach desired caloric intake, while maintaining appropriate intake of nutrient and fiber, sodium and fats, and appropriate energy expenditure required for the weight goal.;Weight Management: Provide education and appropriate resources to help participant work on and attain dietary goals.;Weight Management/Obesity: Establish reasonable short term and long term weight goals.;Obesity: Provide  education and appropriate resources to help participant work on and attain dietary goals.    Admit Weight 238 lb 15.7 oz (108.4 kg)    Expected Outcomes Short Term: Continue to assess and modify interventions until short term weight is achieved;Long Term: Adherence to nutrition and physical activity/exercise program aimed toward attainment of established weight goal;Understanding recommendations for meals to include 15-35% energy as protein, 25-35% energy from fat, 35-60% energy from carbohydrates, less than '200mg'$  of dietary cholesterol, 20-35 gm of total fiber daily;Understanding of distribution of calorie intake throughout the day with the  consumption of 4-5 meals/snacks    Diabetes Yes    Intervention Provide education about signs/symptoms and action to take for hypo/hyperglycemia.;Provide education about proper nutrition, including hydration, and aerobic/resistive exercise prescription along with prescribed medications to achieve blood glucose in normal ranges: Fasting glucose 65-99 mg/dL    Expected Outcomes Short Term: Participant verbalizes understanding of the signs/symptoms and immediate care of hyper/hypoglycemia, proper foot care and importance of medication, aerobic/resistive exercise and nutrition plan for blood glucose control.;Long Term: Attainment of HbA1C < 7%.    Heart Failure Yes    Intervention Provide a combined exercise and nutrition program that is supplemented with education, support and counseling about heart failure. Directed toward relieving symptoms such as shortness of breath, decreased exercise tolerance, and extremity edema.    Expected Outcomes Short term: Attendance in program 2-3 days a week with increased exercise capacity. Reported lower sodium intake. Reported increased fruit and vegetable intake. Reports medication compliance.;Short term: Daily weights obtained and reported for increase. Utilizing diuretic protocols set by physician.;Long term: Adoption of self-care skills and reduction of barriers for early signs and symptoms recognition and intervention leading to self-care maintenance.    Hypertension Yes    Intervention Provide education on lifestyle modifcations including regular physical activity/exercise, weight management, moderate sodium restriction and increased consumption of fresh fruit, vegetables, and low fat dairy, alcohol moderation, and smoking cessation.;Monitor prescription use compliance.    Expected Outcomes Short Term: Continued assessment and intervention until BP is < 140/54m HG in hypertensive participants. < 130/871mHG in hypertensive participants with diabetes, heart failure or  chronic kidney disease.;Long Term: Maintenance of blood pressure at goal levels.    Lipids Yes    Intervention Provide education and support for participant on nutrition & aerobic/resistive exercise along with prescribed medications to achieve LDL '70mg'$ , HDL >'40mg'$ .    Expected Outcomes Short Term: Participant states understanding of desired cholesterol values and is compliant with medications prescribed. Participant is following exercise prescription and nutrition guidelines.;Long Term: Cholesterol controlled with medications as prescribed, with individualized exercise RX and with personalized nutrition plan. Value goals: LDL < '70mg'$ , HDL > 40 mg.             Core Components/Risk Factors/Patient Goals Review:   Goals and Risk Factor Review     Row Name 12/25/21 1601 01/16/22 0943 02/06/22 1452         Core Components/Risk Factors/Patient Goals Review   Personal Goals Review Weight Management/Obesity;Heart Failure;Hypertension;Lipids;Diabetes;Stress Weight Management/Obesity;Heart Failure;Hypertension;Lipids;Diabetes;Stress Weight Management/Obesity;Heart Failure;Hypertension;Lipids;Diabetes;Stress     Review DaLinna Hofftarted exercise at intensive cardiac rehab on 12/25/21. [name] did well with exercise. Max exertional BP 160/77 on the nustep. CBGs's stable. Will continue to montior [name] started exercise at intensive cardiac rehab on 12/25/21. [name]'s vital signs and CBG's have been stable. DaLinna Hoffas lost 2.6 kg since starting the program. DaLinna Hoffas been doing well with exercise at cardiac rehab. [name]'s vital signs and CBG's have  been variable. Linna Hoff has lost 1.1 kg since starting the program. Linna Hoff will complete cardiac rehab on 02/16/22.     Expected Outcomes Linna Hoff will continue to participate in intensive cardiac rehab for exercise, nutrition and lifestyle modifications Linna Hoff will continue to participate in intensive cardiac rehab for exercise, nutrition and lifestyle modifications Linna Hoff will continue to participate in  intensive cardiac rehab for exercise, nutrition and lifestyle modifications              Core Components/Risk Factors/Patient Goals at Discharge (Final Review):   Goals and Risk Factor Review - 02/06/22 1452       Core Components/Risk Factors/Patient Goals Review   Personal Goals Review Weight Management/Obesity;Heart Failure;Hypertension;Lipids;Diabetes;Stress    Review Linna Hoff has been doing well with exercise at cardiac rehab. [name]'s vital signs and CBG's have been variable. Linna Hoff has lost 1.1 kg since starting the program. Linna Hoff will complete cardiac rehab on 02/16/22.    Expected Outcomes Linna Hoff will continue to participate in intensive cardiac rehab for exercise, nutrition and lifestyle modifications             ITP Comments:  ITP Comments     Row Name 12/21/21 1052 12/25/21 1555 01/16/22 0933 02/06/22 1448     ITP Comments Dr Fransico Him MD, Medical Director. Introduction to Pritikin Education/ Intensive Cardiac Rehab. Inital Orientation Packet reviewed with the patient 30 day ITP Review. [name] started intensive cardiac rehab on 12/25/21. [name] did well with exercise 30 day ITP Review. [name] started intensive cardiac rehab on 12/25/21 and is off to a good start to exercise. Linna Hoff may benefit from Balance and Vestibular Rehab as he recently fell going into his home when he completes intensvie cardiac rehab 30 day ITP Review. Linna Hoff has good attendance and participation in intensive cardiac rehab.             Comments: See ITP comments.Harrell Gave RN BSN

## 2022-02-07 ENCOUNTER — Encounter (HOSPITAL_COMMUNITY): Payer: Medicare PPO

## 2022-02-09 ENCOUNTER — Telehealth (HOSPITAL_COMMUNITY): Payer: Self-pay | Admitting: Geriatric Medicine

## 2022-02-09 ENCOUNTER — Encounter (HOSPITAL_COMMUNITY)
Admission: RE | Admit: 2022-02-09 | Discharge: 2022-02-09 | Disposition: A | Discharge status: Home / Self Care | Payer: Medicare PPO | Source: Ambulatory Visit | Attending: Cardiovascular Disease | Admitting: Cardiovascular Disease

## 2022-02-09 DIAGNOSIS — U071 COVID-19: Secondary | ICD-10-CM | POA: Diagnosis not present

## 2022-02-09 NOTE — Progress Notes (Signed)
Tony [name] EP noted patient walking off -balance as he arrived at the cardiac rehab department for his exercise session this morning. Patient reported he has been feeling somewhat queasy and not feeling quite right. Recently had some absences from cardiac rehab due to Covid. He returned on Monday feeling fine, however missed Wednesday due to onset of head congestion, and shared that on Wednesday he also was beginning to not feel well again. He did not reach out to his Primary physician to report symptoms nor did he retest himself for Covid.  BP at rehab 119/76, SpO2 96% RA HR 76, reported blood glucose 132.  Discussed with patient no exercise today and advise follow-up with Primary care physician. Patient called primary care practice while still at rehab but not able to get an appointment today so he said he will go to the Urgent care he typically goes to and will get tested to determine if he has Covid. Escorted patient to his vehicle and instructed to report back to [place] results and plan of care, he said he would do so.

## 2022-02-12 ENCOUNTER — Encounter (HOSPITAL_COMMUNITY)
Admission: RE | Admit: 2022-02-12 | Discharge: 2022-02-12 | Disposition: A | Discharge status: Home / Self Care | Payer: Medicare PPO | Source: Ambulatory Visit | Attending: Cardiovascular Disease | Admitting: Cardiovascular Disease

## 2022-02-12 DIAGNOSIS — Z48812 Encounter for surgical aftercare following surgery on the circulatory system: Secondary | ICD-10-CM | POA: Diagnosis not present

## 2022-02-12 DIAGNOSIS — Z952 Presence of prosthetic heart valve: Secondary | ICD-10-CM | POA: Diagnosis not present

## 2022-02-14 ENCOUNTER — Encounter (HOSPITAL_COMMUNITY)
Admission: RE | Admit: 2022-02-14 | Discharge: 2022-02-14 | Disposition: A | Discharge status: Home / Self Care | Payer: Medicare PPO | Source: Ambulatory Visit | Attending: Cardiovascular Disease | Admitting: Cardiovascular Disease

## 2022-02-14 DIAGNOSIS — Z952 Presence of prosthetic heart valve: Secondary | ICD-10-CM | POA: Diagnosis not present

## 2022-02-14 DIAGNOSIS — Z48812 Encounter for surgical aftercare following surgery on the circulatory system: Secondary | ICD-10-CM | POA: Diagnosis not present

## 2022-02-16 ENCOUNTER — Encounter (HOSPITAL_COMMUNITY)
Admission: RE | Admit: 2022-02-16 | Discharge: 2022-02-16 | Disposition: A | Discharge status: Home / Self Care | Payer: Medicare PPO | Source: Ambulatory Visit | Attending: Cardiovascular Disease | Admitting: Cardiovascular Disease

## 2022-02-16 DIAGNOSIS — Z952 Presence of prosthetic heart valve: Secondary | ICD-10-CM | POA: Diagnosis not present

## 2022-02-16 DIAGNOSIS — Z48812 Encounter for surgical aftercare following surgery on the circulatory system: Secondary | ICD-10-CM | POA: Diagnosis not present

## 2022-02-19 ENCOUNTER — Encounter (HOSPITAL_COMMUNITY): Payer: Medicare PPO

## 2022-02-21 ENCOUNTER — Encounter (HOSPITAL_COMMUNITY)
Admission: RE | Admit: 2022-02-21 | Discharge: 2022-02-21 | Disposition: A | Discharge status: Home / Self Care | Payer: Medicare PPO | Source: Ambulatory Visit | Attending: Cardiovascular Disease | Admitting: Cardiovascular Disease

## 2022-02-21 ENCOUNTER — Encounter (HOSPITAL_COMMUNITY): Payer: Medicare PPO

## 2022-02-21 DIAGNOSIS — Z952 Presence of prosthetic heart valve: Secondary | ICD-10-CM

## 2022-02-21 DIAGNOSIS — Z48812 Encounter for surgical aftercare following surgery on the circulatory system: Secondary | ICD-10-CM | POA: Diagnosis not present

## 2022-02-23 ENCOUNTER — Encounter (HOSPITAL_COMMUNITY): Payer: Medicare PPO

## 2022-02-28 ENCOUNTER — Encounter (HOSPITAL_COMMUNITY)
Admission: RE | Admit: 2022-02-28 | Discharge: 2022-02-28 | Disposition: A | Discharge status: Home / Self Care | Payer: Medicare PPO | Source: Ambulatory Visit | Attending: Cardiovascular Disease | Admitting: Cardiovascular Disease

## 2022-02-28 DIAGNOSIS — Z952 Presence of prosthetic heart valve: Secondary | ICD-10-CM | POA: Insufficient documentation

## 2022-02-28 DIAGNOSIS — Z48812 Encounter for surgical aftercare following surgery on the circulatory system: Secondary | ICD-10-CM | POA: Diagnosis not present

## 2022-02-28 NOTE — Progress Notes (Signed)
Discharge Progress Report  Patient Details  Name: CHRISTO HAIN MRN: [id number] Date of Birth: 09-01-1950 Referring Provider:   Flowsheet Row INTENSIVE CARDIAC REHAB ORIENT from 12/21/2021 in [facility]  Referring Provider [name], MD        Number of Visits: 19 exercise sessions and 18 education classes  Reason for Discharge:  Patient reached a stable level of exercise. Patient independent in their exercise. Patient has met program and personal goals.  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   ADL UCSD:   Initial Exercise Prescription:  Initial Exercise Prescription - 12/21/21 1200       Date of Initial Exercise RX and Referring Provider   Date 12/21/21    Referring Provider [name], MD    Expected Discharge Date 02/16/22      NuStep   Level 2    SPM 75    Minutes 15    METs 2.7      Arm Ergometer   Level 1.5    RPM 60    Minutes 15    METs 2.7      Prescription Details   Frequency (times per week) 3    Duration Progress to 30 minutes of continuous aerobic without signs/symptoms of physical distress      Intensity   THRR 40-80% of Max Heartrate 59-118    Ratings of Perceived Exertion 11-13    Perceived Dyspnea 0-4      Progression   Progression Continue progressive overload as per policy without signs/symptoms or physical distress.      Resistance Training   Training Prescription Yes    Weight 3 lbs    Reps 10-15             Discharge Exercise Prescription (Final Exercise Prescription Changes):  Exercise Prescription Changes - 03/02/22 1021       Response to Exercise   Blood Pressure (Admit) 136/66    Blood Pressure (Exercise) 142/62    Blood Pressure (Exit) 130/62    Heart Rate (Admit) 62 bpm    Heart Rate (Exercise) 82 bpm    Heart Rate (Exit) 71 bpm    Rating of  Perceived Exertion (Exercise) 14    Symptoms None    Duration Continue with 30 min of aerobic exercise without signs/symptoms of physical distress.    Intensity THRR unchanged      Progression   Progression Continue to progress workloads to maintain intensity without signs/symptoms of physical distress.    Average METs 2.3      Resistance Training   Training Prescription Yes    Weight 3 lbs    Reps 10-15    Time 10 Minutes      Interval Training   Interval Training No      NuStep   Level 3    SPM 94    Minutes 15    METs 2.5      Arm Ergometer   Level 2.5    Minutes 15    METs 2.1      Home Exercise Plan   Plans to continue exercise at [employer] (comment)   [facility] [employer].   Frequency Add 2 additional days to program exercise sessions.    Initial Home Exercises Provided 01/15/22  Functional Capacity:  6 Minute Walk     Row Name 12/21/21 1233 02/28/22 1038       6 Minute Walk   Phase Initial Discharge    Distance 1342 feet 1475 feet    Distance % Change -- 9.91 %    Distance Feet Change -- 133 ft    Walk Time 6 minutes 6 minutes    # of Rest Breaks 0 0    MPH 2.6 2.79    METS 2.68 2.92    RPE 11 14    Perceived Dyspnea  0 0    VO2 Peak 9.37 10.22    Symptoms Yes (comment) Yes (comment)    Comments Aching in the hips, denies pain Patient c/o of feeling "winded". RPD=0.    Resting HR 66 bpm 66 bpm    Resting BP 134/70 112/66    Resting Oxygen Saturation  97 % --    Exercise Oxygen Saturation  during 6 min walk 97 % --    Max Ex. HR 97 bpm 92 bpm    Max Ex. BP 148/80 158/74    2 Minute Post BP 140/80 122/64             Psychological, QOL, Others - Outcomes: PHQ 2/9:    03/09/2022   11:31 AM 12/21/2021   10:24 AM 12/21/2015    9:06 AM  Depression screen PHQ 2/9  Decreased Interest 0 1 0  Down, Depressed, Hopeless 0 1 0  PHQ - 2 Score 0 2 0  Altered sleeping  1   Tired,  decreased energy  1   Change in appetite  1   Feeling bad or failure about yourself   0   Trouble concentrating  0   Moving slowly or fidgety/restless  0   PHQ-9 Score  5   Difficult doing work/chores  Somewhat difficult     Quality of Life:  Quality of Life - 03/02/22 1636       Quality of Life   Select Quality of Life      Quality of Life Scores   Health/Function Pre 17.3 %    Health/Function Post 22.5 %    Health/Function % Change 30.06 %    Socioeconomic Pre 21.83 %    Socioeconomic Post 21.67 %    Socioeconomic % Change  -0.73 %    Psych/Spiritual Pre 22.21 %    Psych/Spiritual Post 22.42 %    Psych/Spiritual % Change 0.95 %    Family Pre 25.5 %    Family Post 23.4 %    Family % Change -8.24 %    GLOBAL Pre 20.25 %    GLOBAL Post 22.47 %    GLOBAL % Change 10.96 %             Personal Goals: Goals established at orientation with interventions provided to work toward goal.  Personal Goals and Risk Factors at Admission - 12/21/21 1231       Core Components/Risk Factors/Patient Goals on Admission    Weight Management Yes;Obesity;Weight Loss    Intervention Weight Management: Develop a combined nutrition and exercise program designed to reach desired caloric intake, while maintaining appropriate intake of nutrient and fiber, sodium and fats, and appropriate energy expenditure required for the weight goal.;Weight Management: Provide education and appropriate resources to help participant work on and attain dietary goals.;Weight Management/Obesity: Establish reasonable short term and long term weight goals.;Obesity: Provide education and appropriate resources to help participant work on and attain  dietary goals.    Admit Weight 238 lb 15.7 oz (108.4 kg)    Expected Outcomes Short Term: Continue to assess and modify interventions until short term weight is achieved;Long Term: Adherence to nutrition and physical activity/exercise program aimed toward attainment of  established weight goal;Understanding recommendations for meals to include 15-35% energy as protein, 25-35% energy from fat, 35-60% energy from carbohydrates, less than 264m of dietary cholesterol, 20-35 gm of total fiber daily;Understanding of distribution of calorie intake throughout the day with the consumption of 4-5 meals/snacks    Diabetes Yes    Intervention Provide education about signs/symptoms and action to take for hypo/hyperglycemia.;Provide education about proper nutrition, including hydration, and aerobic/resistive exercise prescription along with prescribed medications to achieve blood glucose in normal ranges: Fasting glucose 65-99 mg/dL    Expected Outcomes Short Term: Participant verbalizes understanding of the signs/symptoms and immediate care of hyper/hypoglycemia, proper foot care and importance of medication, aerobic/resistive exercise and nutrition plan for blood glucose control.;Long Term: Attainment of HbA1C < 7%.    Heart Failure Yes    Intervention Provide a combined exercise and nutrition program that is supplemented with education, support and counseling about heart failure. Directed toward relieving symptoms such as shortness of breath, decreased exercise tolerance, and extremity edema.    Expected Outcomes Short term: Attendance in program 2-3 days a week with increased exercise capacity. Reported lower sodium intake. Reported increased fruit and vegetable intake. Reports medication compliance.;Short term: Daily weights obtained and reported for increase. Utilizing diuretic protocols set by physician.;Long term: Adoption of self-care skills and reduction of barriers for early signs and symptoms recognition and intervention leading to self-care maintenance.    Hypertension Yes    Intervention Provide education on lifestyle modifcations including regular physical activity/exercise, weight management, moderate sodium restriction and increased consumption of fresh fruit,  vegetables, and low fat dairy, alcohol moderation, and smoking cessation.;Monitor prescription use compliance.    Expected Outcomes Short Term: Continued assessment and intervention until BP is < 140/952mHG in hypertensive participants. < 130/8086mG in hypertensive participants with diabetes, heart failure or chronic kidney disease.;Long Term: Maintenance of blood pressure at goal levels.    Lipids Yes    Intervention Provide education and support for participant on nutrition & aerobic/resistive exercise along with prescribed medications to achieve LDL <73m58mDL >40mg81m Expected Outcomes Short Term: Participant states understanding of desired cholesterol values and is compliant with medications prescribed. Participant is following exercise prescription and nutrition guidelines.;Long Term: Cholesterol controlled with medications as prescribed, with individualized exercise RX and with personalized nutrition plan. Value goals: LDL < 73mg,36m > 40 mg.              Personal Goals Discharge:  Goals and Risk Factor Review     Row Name 12/25/21 1601 01/16/22 0943 02/06/22 1452         Core Components/Risk Factors/Patient Goals Review   Personal Goals Review Weight Management/Obesity;Heart Failure;Hypertension;Lipids;Diabetes;Stress Weight Management/Obesity;Heart Failure;Hypertension;Lipids;Diabetes;Stress Weight Management/Obesity;Heart Failure;Hypertension;Lipids;Diabetes;Stress     Review [name] stLinna Hoffed exercise at intensive cardiac rehab on 12/25/21. [name] did well with exercise. Max exertional BP 160/77 on the nustep. CBGs's stable. Will continue to montior [name] started exercise at intensive cardiac rehab on 12/25/21. [name]'s vital signs and CBG's have been stable. [name] haLinna Hoffost 2.6 kg since starting the program. [name] haLinna Hoffeen doing well with exercise at cardiac rehab. [name]'s vital signs and CBG's have been variable. [name] haLinna Hoffost 1.1 kg since starting the program. [name] wiLinna Hoff  complete cardiac rehab on  02/16/22.     Expected Outcomes Linna Hoff will continue to participate in intensive cardiac rehab for exercise, nutrition and lifestyle modifications Linna Hoff will continue to participate in intensive cardiac rehab for exercise, nutrition and lifestyle modifications Linna Hoff will continue to participate in intensive cardiac rehab for exercise, nutrition and lifestyle modifications              Exercise Goals and Review:  Exercise Goals     Row Name 12/21/21 1238             Exercise Goals   Increase Physical Activity Yes       Intervention Provide advice, education, support and counseling about physical activity/exercise needs.;Develop an individualized exercise prescription for aerobic and resistive training based on initial evaluation findings, risk stratification, comorbidities and participant's personal goals.       Expected Outcomes Short Term: Attend rehab on a regular basis to increase amount of physical activity.;Long Term: Add in home exercise to make exercise part of routine and to increase amount of physical activity.;Long Term: Exercising regularly at least 3-5 days a week.       Increase Strength and Stamina Yes       Intervention Provide advice, education, support and counseling about physical activity/exercise needs.;Develop an individualized exercise prescription for aerobic and resistive training based on initial evaluation findings, risk stratification, comorbidities and participant's personal goals.       Expected Outcomes Long Term: Improve cardiorespiratory fitness, muscular endurance and strength as measured by increased METs and functional capacity (6MWT);Short Term: Perform resistance training exercises routinely during rehab and add in resistance training at home;Short Term: Increase workloads from initial exercise prescription for resistance, speed, and METs.       Able to understand and use rate of perceived exertion (RPE) scale Yes       Intervention Provide education and  explanation on how to use RPE scale       Expected Outcomes Short Term: Able to use RPE daily in rehab to express subjective intensity level;Long Term:  Able to use RPE to guide intensity level when exercising independently       Knowledge and understanding of Target Heart Rate Range (THRR) Yes       Intervention Provide education and explanation of THRR including how the numbers were predicted and where they are located for reference       Expected Outcomes Short Term: Able to state/look up THRR;Short Term: Able to use daily as guideline for intensity in rehab;Long Term: Able to use THRR to govern intensity when exercising independently       Understanding of Exercise Prescription Yes       Intervention Provide education, explanation, and written materials on patient's individual exercise prescription       Expected Outcomes Short Term: Able to explain program exercise prescription;Long Term: Able to explain home exercise prescription to exercise independently                Exercise Goals Re-Evaluation:  Exercise Goals Re-Evaluation     Row Name 12/25/21 1438 01/03/22 1137 01/15/22 1058 01/29/22 1100 02/28/22 1100     Exercise Goal Re-Evaluation   Exercise Goals Review Increase Physical Activity;Able to understand and use rate of perceived exertion (RPE) scale Increase Physical Activity;Able to understand and use rate of perceived exertion (RPE) scale;Knowledge and understanding of Target Heart Rate Range (THRR);Able to check pulse independently Increase Physical Activity;Able to understand and use rate of perceived exertion (RPE) scale;Knowledge  and understanding of Target Heart Rate Range (THRR);Able to check pulse independently;Increase Strength and Stamina;Understanding of Exercise Prescription Increase Physical Activity;Able to understand and use rate of perceived exertion (RPE) scale;Knowledge and understanding of Target Heart Rate Range (THRR);Able to check pulse independently;Increase  Strength and Stamina;Understanding of Exercise Prescription Increase Physical Activity;Able to understand and use rate of perceived exertion (RPE) scale;Knowledge and understanding of Target Heart Rate Range (THRR);Able to check pulse independently;Increase Strength and Stamina;Understanding of Exercise Prescription   Comments Patient able to understand and use RPE scale appropriately. Making slow progress with exercise. Patient wants to know his THRR and to exercise in his target. Reviewed THRR with patient. Paitent is able to manually count his pulse. Patient is doing some walking at home[facility] Will review home exericse prescription with patient next week. Reviewed exercise prescription with patient. Patient plans to exercise at local fitness center, walking the track and using the equipment as his mode of home exercise. Patient's goal is to increase leg strength. Patient is able to monitor his pulse with exercise. Patient is walking ~10-15 minutes, 2 days/week at home in addition to exercise at cardiac rehab. Patient's goal is to be consistent with exercise routine. Patient is scheduled to complete cardiac rehab this week. [facility], [facility]. Patient will add 1-2 days resistance training to routine. Patient feels that his balance is better than prior to starting to the program, and that he is more stable. Functional capacity increased 10% as measured by 6MWT.   Expected Outcomes Progress workloads as tolerated to help increase strength and stamina. Improve cardiorespiratory fitness. Continue to progress workloads as tolerated. Paitent will add [facility] rehab to achieve 150 minutes of aerobic exercise/week. Increase hand weights and workloads as tolerared to help increase strength. Continue to increase walking at home and maintain a consistent exercise routine. Patient  will exercise at least 30 minutes, 3 days/week upon completion of the cardiac rehab program.    [place] Name 03/02/22 1138             Exercise Goal Re-Evaluation   Exercise Goals Review Increase Physical Activity;Able to understand and use rate of perceived exertion (RPE) scale;Knowledge and understanding of Target Heart Rate Range (THRR);Able to check pulse independently;Increase Strength and Stamina;Understanding of Exercise Prescription       Comments Patient completed the cardiac rehab program and [facility].       Expected Outcomes Patient will exercise at least 30 minutes, 3 days/week to maintain health and fitness gains.                Nutrition & Weight - Outcomes:  Pre Biometrics - 12/21/21 1005       Pre Biometrics   Waist Circumference 48 inches    Hip Circumference 48 inches    Waist to Hip Ratio 1 %    Triceps Skinfold 9 mm    % Body Fat 31 %    Grip Strength 46 kg    Flexibility 0 in   Unable to reach   Single Leg Stand 2.25 seconds             Post Biometrics - 03/02/22 1043        Post  Biometrics   Height 5' 11.25" (1.81 m)    Waist Circumference  46.5 inches    Hip Circumference 46 inches    Waist to Hip Ratio 1.01 %    BMI (Calculated) 32.23    Triceps Skinfold 12 mm    % Body Fat 31.2 %    Grip Strength 40 kg    Flexibility 0 in   Unable to reach   Single Leg Stand 1.56 seconds             Nutrition:  Nutrition Therapy & Goals - 02/21/22 1548       Nutrition Therapy   Diet Heart Healthy/Carbohydrate Consistent Diet    Drug/Food Interactions Statins/Certain Fruits      Personal Nutrition Goals   Nutrition Goal Patient to identify strategies for decreasing cardiovascular risk by attending the [employer] lifestyle and nutrition education weekly classes.    Personal Goal #2 Patient to use the plate method as a guide for meal planning to include fruits, vegetables, whole grains, lean protein/plant protein,  nonfat dairy as part of heart healthy lifestyle    Personal Goal #3 Patient to identify and limit daily food sources of refind carbohydrates, saturated fat, trans fat, and sodium    Personal Goal #4 Patient to reduce sodium intake <15104m per day    Comments Goals in progress.DLinna Hoffcontinues to attend the [employer] [facility]. He has also previously participated in cardiac rehab and even worked in cardiac rehab. He has excellent support from his children. Danis down ~4# since starting with our program.      Intervention Plan   Intervention Prescribe, educate and counsel regarding individualized specific dietary modifications aiming towards targeted core components such as weight, hypertension, lipid management, diabetes, heart failure and other comorbidities.;Nutrition handout(s) given to patient.    Expected Outcomes Short Term Goal: Understand basic principles of dietary content, such as calories, fat, sodium, cholesterol and nutrients.;Long Term Goal: Adherence to prescribed nutrition plan.             Nutrition Discharge:   Education Questionnaire Score:  Knowledge Questionnaire Score - 03/02/22 1636       Knowledge Questionnaire Score   Pre Score 22/24    Post Score 21/24             Goals reviewed with patient; copy given to patient.Pt graduates from  Intensive cardiac rehab program on 03/02/22  with completion of  19 exercise and  18 education sessions. Pt maintained good attendance and progressed nicely during their participation in rehab as evidenced by increased MET level.   Medication list reconciled. Repeat  PHQ score-0  .  Pt has made significant lifestyle changes and should be commended for their success. [name] achieved their goals during cardiac rehab.   [name] increased his distance on his post exercise walk test by 133 feet. We are proud of [name]'s progress!MHarrell GaveRN BSN

## 2022-03-02 ENCOUNTER — Encounter (HOSPITAL_COMMUNITY)
Admission: RE | Admit: 2022-03-02 | Discharge: 2022-03-02 | Disposition: A | Discharge status: Home / Self Care | Payer: Medicare PPO | Source: Ambulatory Visit | Attending: Cardiovascular Disease | Admitting: Cardiovascular Disease

## 2022-03-02 VITALS — BP 136/66 | HR 62 | Ht 71.25 in | Wt 232.8 lb

## 2022-03-02 DIAGNOSIS — Z952 Presence of prosthetic heart valve: Secondary | ICD-10-CM

## 2022-03-02 DIAGNOSIS — Z48812 Encounter for surgical aftercare following surgery on the circulatory system: Secondary | ICD-10-CM | POA: Diagnosis not present

## 2022-03-09 DIAGNOSIS — E1165 Type 2 diabetes mellitus with hyperglycemia: Secondary | ICD-10-CM | POA: Diagnosis not present

## 2022-03-09 DIAGNOSIS — E669 Obesity, unspecified: Secondary | ICD-10-CM | POA: Diagnosis not present

## 2022-03-09 DIAGNOSIS — Z23 Encounter for immunization: Secondary | ICD-10-CM | POA: Diagnosis not present

## 2022-03-09 DIAGNOSIS — Z794 Long term (current) use of insulin: Secondary | ICD-10-CM | POA: Diagnosis not present

## 2022-03-09 DIAGNOSIS — E039 Hypothyroidism, unspecified: Secondary | ICD-10-CM | POA: Diagnosis not present

## 2022-03-22 ENCOUNTER — Observation Stay (HOSPITAL_COMMUNITY)
Admission: EM | Admit: 2022-03-22 | Discharge: 2022-03-23 | Disposition: A | Discharge status: Home Health | Payer: Medicare PPO | Attending: Family Medicine | Admitting: Family Medicine

## 2022-03-22 ENCOUNTER — Other Ambulatory Visit: Payer: Self-pay

## 2022-03-22 ENCOUNTER — Emergency Department (HOSPITAL_COMMUNITY): Payer: Medicare PPO

## 2022-03-22 ENCOUNTER — Encounter (HOSPITAL_COMMUNITY): Payer: Self-pay | Admitting: Internal Medicine

## 2022-03-22 DIAGNOSIS — I152 Hypertension secondary to endocrine disorders: Secondary | ICD-10-CM | POA: Diagnosis present

## 2022-03-22 DIAGNOSIS — R29810 Facial weakness: Secondary | ICD-10-CM | POA: Diagnosis not present

## 2022-03-22 DIAGNOSIS — I639 Cerebral infarction, unspecified: Principal | ICD-10-CM

## 2022-03-22 DIAGNOSIS — R531 Weakness: Secondary | ICD-10-CM | POA: Diagnosis not present

## 2022-03-22 DIAGNOSIS — I5022 Chronic systolic (congestive) heart failure: Secondary | ICD-10-CM | POA: Insufficient documentation

## 2022-03-22 DIAGNOSIS — I10 Essential (primary) hypertension: Secondary | ICD-10-CM | POA: Diagnosis present

## 2022-03-22 DIAGNOSIS — Z85850 Personal history of malignant neoplasm of thyroid: Secondary | ICD-10-CM | POA: Insufficient documentation

## 2022-03-22 DIAGNOSIS — E039 Hypothyroidism, unspecified: Secondary | ICD-10-CM | POA: Insufficient documentation

## 2022-03-22 DIAGNOSIS — E1142 Type 2 diabetes mellitus with diabetic polyneuropathy: Secondary | ICD-10-CM | POA: Diagnosis present

## 2022-03-22 DIAGNOSIS — R7989 Other specified abnormal findings of blood chemistry: Secondary | ICD-10-CM | POA: Diagnosis not present

## 2022-03-22 DIAGNOSIS — I5042 Chronic combined systolic (congestive) and diastolic (congestive) heart failure: Secondary | ICD-10-CM | POA: Diagnosis present

## 2022-03-22 DIAGNOSIS — I110 Hypertensive heart disease with heart failure: Secondary | ICD-10-CM | POA: Insufficient documentation

## 2022-03-22 DIAGNOSIS — E1151 Type 2 diabetes mellitus with diabetic peripheral angiopathy without gangrene: Secondary | ICD-10-CM | POA: Diagnosis not present

## 2022-03-22 DIAGNOSIS — Z952 Presence of prosthetic heart valve: Secondary | ICD-10-CM | POA: Insufficient documentation

## 2022-03-22 DIAGNOSIS — N179 Acute kidney failure, unspecified: Secondary | ICD-10-CM | POA: Insufficient documentation

## 2022-03-22 DIAGNOSIS — Z950 Presence of cardiac pacemaker: Secondary | ICD-10-CM | POA: Insufficient documentation

## 2022-03-22 DIAGNOSIS — I6523 Occlusion and stenosis of bilateral carotid arteries: Secondary | ICD-10-CM | POA: Diagnosis not present

## 2022-03-22 DIAGNOSIS — F331 Major depressive disorder, recurrent, moderate: Secondary | ICD-10-CM | POA: Diagnosis present

## 2022-03-22 DIAGNOSIS — Z79899 Other long term (current) drug therapy: Secondary | ICD-10-CM | POA: Insufficient documentation

## 2022-03-22 DIAGNOSIS — K219 Gastro-esophageal reflux disease without esophagitis: Secondary | ICD-10-CM | POA: Diagnosis present

## 2022-03-22 DIAGNOSIS — Z7982 Long term (current) use of aspirin: Secondary | ICD-10-CM | POA: Diagnosis not present

## 2022-03-22 DIAGNOSIS — E119 Type 2 diabetes mellitus without complications: Secondary | ICD-10-CM | POA: Diagnosis present

## 2022-03-22 DIAGNOSIS — R200 Anesthesia of skin: Secondary | ICD-10-CM | POA: Diagnosis not present

## 2022-03-22 DIAGNOSIS — I6503 Occlusion and stenosis of bilateral vertebral arteries: Secondary | ICD-10-CM | POA: Diagnosis not present

## 2022-03-22 DIAGNOSIS — E782 Mixed hyperlipidemia: Secondary | ICD-10-CM | POA: Diagnosis not present

## 2022-03-22 DIAGNOSIS — Z794 Long term (current) use of insulin: Secondary | ICD-10-CM | POA: Diagnosis not present

## 2022-03-22 DIAGNOSIS — I459 Conduction disorder, unspecified: Secondary | ICD-10-CM | POA: Diagnosis present

## 2022-03-22 LAB — URINALYSIS, ROUTINE W REFLEX MICROSCOPIC
Bacteria, UA: NONE SEEN
Bilirubin Urine: NEGATIVE
Glucose, UA: >=500 mg/dL — AB
Hgb urine dipstick: NEGATIVE
Ketones, ur: NEGATIVE mg/dL
Leukocytes,Ua: NEGATIVE
Nitrite: NEGATIVE
Protein, ur: 100 mg/dL — AB
Specific Gravity, Urine: 1.032 — ABNORMAL HIGH (ref 1.005–1.030)
pH: 7 (ref 5.0–8.0)

## 2022-03-22 LAB — CBC
HCT: 45.4 % (ref 39.0–52.0)
HCT: 39.2 % (ref 39.0–52.0)
Hemoglobin: 15.5 g/dL (ref 13.0–17.0)
Hemoglobin: 13.5 g/dL (ref 13.0–17.0)
MCH: 30.1 pg (ref 26.0–34.0)
MCH: 30.1 pg (ref 26.0–34.0)
MCHC: 34.1 g/dL (ref 30.0–36.0)
MCHC: 34.4 g/dL (ref 30.0–36.0)
MCV: 88.2 fL (ref 80.0–100.0)
MCV: 87.3 fL (ref 80.0–100.0)
Platelets: 159 10*3/uL (ref 150–400)
Platelets: 141 10*3/uL — ABNORMAL LOW (ref 150–400)
RBC: 5.15 MIL/uL (ref 4.22–5.81)
RBC: 4.49 MIL/uL (ref 4.22–5.81)
RDW: 12.9 % (ref 11.5–15.5)
RDW: 13 % (ref 11.5–15.5)
WBC: 9.2 10*3/uL (ref 4.0–10.5)
WBC: 9.1 10*3/uL (ref 4.0–10.5)
nRBC: 0 % (ref 0.0–0.2)
nRBC: 0 % (ref 0.0–0.2)

## 2022-03-22 LAB — I-STAT CHEM 8, ED
BUN: 14 mg/dL (ref 8–23)
Calcium, Ion: 1.11 mmol/L — ABNORMAL LOW (ref 1.15–1.40)
Chloride: 99 mmol/L (ref 98–111)
Creatinine, Ser: 1.5 mg/dL — ABNORMAL HIGH (ref 0.61–1.24)
Glucose, Bld: 172 mg/dL — ABNORMAL HIGH (ref 70–99)
HCT: 45 % (ref 39.0–52.0)
Hemoglobin: 15.3 g/dL (ref 13.0–17.0)
Potassium: 3.9 mmol/L (ref 3.5–5.1)
Sodium: 140 mmol/L (ref 135–145)
TCO2: 29 mmol/L (ref 22–32)

## 2022-03-22 LAB — DIFFERENTIAL
Abs Immature Granulocytes: 0.07 10*3/uL (ref 0.00–0.07)
Basophils Absolute: 0.1 10*3/uL (ref 0.0–0.1)
Basophils Relative: 1 %
Eosinophils Absolute: 0.2 10*3/uL (ref 0.0–0.5)
Eosinophils Relative: 3 %
Immature Granulocytes: 1 %
Lymphocytes Relative: 28 %
Lymphs Abs: 2.6 10*3/uL (ref 0.7–4.0)
Monocytes Absolute: 0.9 10*3/uL (ref 0.1–1.0)
Monocytes Relative: 10 %
Neutro Abs: 5.4 10*3/uL (ref 1.7–7.7)
Neutrophils Relative %: 57 %

## 2022-03-22 LAB — COMPREHENSIVE METABOLIC PANEL
ALT: 29 U/L (ref 0–44)
AST: 30 U/L (ref 15–41)
Albumin: 4.2 g/dL (ref 3.5–5.0)
Alkaline Phosphatase: 71 U/L (ref 38–126)
Anion gap: 12 (ref 5–15)
BUN: 12 mg/dL (ref 8–23)
CO2: 28 mmol/L (ref 22–32)
Calcium: 9.7 mg/dL (ref 8.9–10.3)
Chloride: 100 mmol/L (ref 98–111)
Creatinine, Ser: 1.48 mg/dL — ABNORMAL HIGH (ref 0.61–1.24)
GFR, Estimated: 50 mL/min — ABNORMAL LOW (ref >60)
Glucose, Bld: 169 mg/dL — ABNORMAL HIGH (ref 70–99)
Potassium: 3.9 mmol/L (ref 3.5–5.1)
Sodium: 140 mmol/L (ref 135–145)
Total Bilirubin: 1.8 mg/dL — ABNORMAL HIGH (ref 0.3–1.2)
Total Protein: 7.6 g/dL (ref 6.5–8.1)

## 2022-03-22 LAB — CBG MONITORING, ED
Glucose-Capillary: 163 mg/dL — ABNORMAL HIGH (ref 70–99)
Glucose-Capillary: 184 mg/dL — ABNORMAL HIGH (ref 70–99)

## 2022-03-22 LAB — CREATININE, SERUM
Creatinine, Ser: 1.43 mg/dL — ABNORMAL HIGH (ref 0.61–1.24)
GFR, Estimated: 52 mL/min — ABNORMAL LOW (ref >60)

## 2022-03-22 LAB — RAPID URINE DRUG SCREEN, HOSP PERFORMED
Amphetamines: NOT DETECTED
Barbiturates: NOT DETECTED
Benzodiazepines: NOT DETECTED
Cocaine: NOT DETECTED
Opiates: NOT DETECTED
Tetrahydrocannabinol: NOT DETECTED

## 2022-03-22 LAB — ETHANOL: Alcohol, Ethyl (B): <10 mg/dL (ref <10)

## 2022-03-22 LAB — TSH: TSH: 3.342 u[IU]/mL (ref 0.350–4.500)

## 2022-03-22 LAB — PROTIME-INR
INR: 1 (ref 0.8–1.2)
Prothrombin Time: 13 seconds (ref 11.4–15.2)

## 2022-03-22 LAB — APTT: aPTT: 26 seconds (ref 24–36)

## 2022-03-22 MED ORDER — FAMOTIDINE 20 MG PO TABS: 20.0000 mg | ORAL_TABLET | Freq: Every day | ORAL | Status: DC

## 2022-03-22 MED ORDER — ACETAMINOPHEN 500 MG PO TABS
500.0000 mg | ORAL_TABLET | Freq: Four times a day (QID) | ORAL | Status: DC | PRN
  Administered 2022-03-22: 500 mg via ORAL
  Filled 2022-03-22: qty 1

## 2022-03-22 MED ORDER — LABETALOL HCL 5 MG/ML IV SOLN: 5.0000 mg | INTRAVENOUS | Status: DC | PRN

## 2022-03-22 MED ORDER — FAMOTIDINE 10 MG PO TABS
10.0000 mg | ORAL_TABLET | Freq: Every day | ORAL | Status: DC
  Administered 2022-03-23: 10 mg via ORAL
  Filled 2022-03-22: qty 1

## 2022-03-22 MED ORDER — SODIUM CHLORIDE 0.9 % IV SOLN: INTRAVENOUS | Status: AC

## 2022-03-22 MED ORDER — CLOPIDOGREL BISULFATE 75 MG PO TABS
75.0000 mg | ORAL_TABLET | Freq: Every day | ORAL | Status: DC
  Administered 2022-03-23: 75 mg via ORAL
  Filled 2022-03-22: qty 1

## 2022-03-22 MED ORDER — ONDANSETRON HCL 4 MG/2ML IJ SOLN: 4.0000 mg | Freq: Four times a day (QID) | INTRAMUSCULAR | Status: DC | PRN

## 2022-03-22 MED ORDER — IOHEXOL 350 MG/ML SOLN
75.0000 mL | Freq: Once | INTRAVENOUS | Status: AC | PRN
  Administered 2022-03-22: 75 mL via INTRAVENOUS

## 2022-03-22 MED ORDER — LEVOTHYROXINE SODIUM 100 MCG PO TABS: 200.0000 ug | ORAL_TABLET | Freq: Every day | ORAL | Status: DC

## 2022-03-22 MED ORDER — INSULIN GLARGINE-YFGN 100 UNIT/ML ~~LOC~~ SOLN
20.0000 [IU] | Freq: Two times a day (BID) | SUBCUTANEOUS | Status: DC
  Administered 2022-03-22 – 2022-03-23 (×2): 20 [IU] via SUBCUTANEOUS
  Filled 2022-03-22 (×5): qty 0.2

## 2022-03-22 MED ORDER — LEVOTHYROXINE SODIUM 100 MCG PO TABS
200.0000 ug | ORAL_TABLET | Freq: Every day | ORAL | Status: DC
  Administered 2022-03-23: 200 ug via ORAL
  Filled 2022-03-22: qty 2

## 2022-03-22 MED ORDER — LABETALOL HCL 5 MG/ML IV SOLN
INTRAVENOUS | Status: AC
  Filled 2022-03-22: qty 4

## 2022-03-22 MED ORDER — CLOPIDOGREL BISULFATE 300 MG PO TABS
300.0000 mg | ORAL_TABLET | Freq: Once | ORAL | Status: AC
  Administered 2022-03-22: 300 mg via ORAL
  Filled 2022-03-22: qty 1

## 2022-03-22 MED ORDER — INSULIN ASPART 100 UNIT/ML IJ SOLN: 0.0000 [IU] | Freq: Every day | INTRAMUSCULAR | Status: DC

## 2022-03-22 MED ORDER — ASPIRIN 81 MG PO TBEC
81.0000 mg | DELAYED_RELEASE_TABLET | Freq: Every day | ORAL | Status: DC
  Administered 2022-03-22 – 2022-03-23 (×2): 81 mg via ORAL
  Filled 2022-03-22 (×2): qty 1

## 2022-03-22 MED ORDER — INSULIN ASPART 100 UNIT/ML IJ SOLN
0.0000 [IU] | Freq: Three times a day (TID) | INTRAMUSCULAR | Status: DC
  Administered 2022-03-23: 3 [IU] via SUBCUTANEOUS

## 2022-03-22 MED ORDER — ATORVASTATIN CALCIUM 80 MG PO TABS
80.0000 mg | ORAL_TABLET | Freq: Every day | ORAL | Status: DC
  Administered 2022-03-22 – 2022-03-23 (×2): 80 mg via ORAL
  Filled 2022-03-22: qty 2
  Filled 2022-03-22: qty 1

## 2022-03-22 MED ORDER — ENOXAPARIN SODIUM 40 MG/0.4ML IJ SOSY
40.0000 mg | PREFILLED_SYRINGE | INTRAMUSCULAR | Status: DC
  Administered 2022-03-22: 40 mg via SUBCUTANEOUS
  Filled 2022-03-22: qty 0.4

## 2022-03-22 MED ORDER — GABAPENTIN 100 MG PO CAPS
100.0000 mg | ORAL_CAPSULE | Freq: Every day | ORAL | Status: DC
  Administered 2022-03-22: 100 mg via ORAL
  Filled 2022-03-22: qty 1

## 2022-03-22 MED ORDER — ONDANSETRON HCL 4 MG PO TABS: 4.0000 mg | ORAL_TABLET | Freq: Four times a day (QID) | ORAL | Status: DC | PRN

## 2022-03-22 MED ORDER — SERTRALINE HCL 100 MG PO TABS
200.0000 mg | ORAL_TABLET | Freq: Every day | ORAL | Status: DC
  Administered 2022-03-22 – 2022-03-23 (×2): 200 mg via ORAL
  Filled 2022-03-22 (×3): qty 2

## 2022-03-22 MED ORDER — STROKE: EARLY STAGES OF RECOVERY BOOK
Freq: Once | Status: AC
  Filled 2022-03-22: qty 1

## 2022-03-22 MED ORDER — TAMSULOSIN HCL 0.4 MG PO CAPS
0.4000 mg | ORAL_CAPSULE | Freq: Every morning | ORAL | Status: DC
  Administered 2022-03-23: 0.4 mg via ORAL
  Filled 2022-03-22: qty 1

## 2022-03-22 NOTE — Code Documentation (Signed)
Responded to Code Stroke called on pt already in the ED for L facial droop/L arm numbness/weakness, LSN-1800. Code Stroke was initiated at [place]. CBG-163, NIH-2 for L facial droop and dysarthria. CT head-negative for acute changes. CTA-no LVO. TNK not given-symptoms too mild. Pt remains in TNK window until 2230. Q48mn VS and Q333m neuro checks until then. BP parameters <220/120.

## 2022-03-22 NOTE — H&P (Signed)
History and Physical    Tony [name] JSE:[id number] DOB: 1950/09/20 DOA: 03/22/2022  PCP: Lajean Manes, MD  Patient coming from: Home  Chief Complaint: Facial droop, numbness  HPI: Tony [name] is a 71 y.o. 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  Per patient, he was at home, had just eaten an ice cream sandwich, when he felt tingling in the right side of his face, heaviness of the left hand and had a facial droop per family.  His daughter is an anesthesiologist and his son is a paramedic, they responded quickly and he was brought to the ED.  His symptoms were improving.  CT and CTA of the head and neck did not show acute stroke.  He has an MRI incompatible PCM, so he cannot get an MRI.  He was seen by Neurology.  Per family and patient, he has had chronic issues with lightheadedness, low blood pressures and disequilibrium since having his TAVR procedure.  He has been in rehab and has started getting strength back. He has chronic right sided blindness, due to previous CVA per report from family.  This was about 10 years ago.  He is a nonsmoker.  He is mostly compliant with his medications, however, he has been out of invokana (on back order) and gabapentin for about 3 weeks.   ED Course: In the ED, Neurology was consulted.  CT head showed mild chronic changes.  CTA of the head and neck showed some moderate intracranial atherosclerosis.  Labs showed an elevated Cr to 1.48, Glucose of 169.  Baseline Cr appears to be around 1.1 - 1.2.  T bili of 1.8, GFR of 50, H/H 15 and 45.  INR 1.0.  Neurology did not feel TPA benefits outweighed risk.  Admit to medicine.   Review of Systems: As per HPI otherwise all other systems reviewed and are negative.   Past Medical History:  Diagnosis Date   Cancer [facility])    thyroid   CHF  (congestive heart failure) (HCC)    Complete heart block (HCC)    a. 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 Burt Knack and Dr. Cyndia Bent   Severe aortic stenosis    Sleep apnea     Past Surgical History:  Procedure Laterality Date   CARDIAC CATHETERIZATION     INTRAOPERATIVE TRANSTHORACIC ECHOCARDIOGRAM N/A 08/29/2021   Procedure: INTRAOPERATIVE TRANSTHORACIC ECHOCARDIOGRAM;  Surgeon: [name], MD;  Location: [place] [facility];  Service: Open Heart Surgery;  Laterality: N/A;   LUMBAR LAMINECTOMY/DECOMPRESSION MICRODISCECTOMY  07/19/2011   Procedure: LUMBAR LAMINECTOMY/DECOMPRESSION MICRODISCECTOMY 3 LEVELS;  Surgeon: Melina Schools, MD;  Location: [place];  Service: Orthopedics;  Laterality: Left;  Lumbar three-Lumbar five LEFT DECOMPRESSION AND FORAMINOTOMY Lumbar three-four LEFT DISCECTOMY   PERMANENT PACEMAKER INSERTION N/A 08/11/2014   MDT Adapta L implanted by Dr [name] for CHB   RIGHT HEART CATH AND CORONARY ANGIOGRAPHY  N/A 08/07/2021   Procedure: RIGHT HEART CATH AND CORONARY ANGIOGRAPHY;  Surgeon: [name], MD;  Location: [place] [facility];  Service: Cardiovascular;  Laterality: N/A;   RIGHT/LEFT HEART CATH AND CORONARY ANGIOGRAPHY N/A 08/07/2021   Procedure: RIGHT/LEFT HEART CATH AND CORONARY ANGIOGRAPHY;  Surgeon: [name], MD;  Location: [place] [facility];  Service: Cardiovascular;  Laterality: N/A;   Thyroidectomy x2     TRANSCATHETER AORTIC VALVE REPLACEMENT, TRANSFEMORAL N/A 08/29/2021   Procedure: Transcatheter Aortic Valve Replacement, Transfemoral;  Surgeon: [name],  MD;  Location: [place] [facility];  Service: Open Heart Surgery;  Laterality: N/A;    Social History  reports that he has never smoked. He has never used smokeless tobacco. He reports that he does not drink alcohol and does not use drugs.  Allergies  Allergen Reactions   Corticosteroids     Per patient's daughter patient is not allergic as of 07/21/21.   Codeine Itching   Crestor [Rosuvastatin Calcium] Other (See Comments)    Leg pain   Metformin Hcl Diarrhea   Simvastatin Other (See Comments)    Leg pain    Family History  Problem Relation Age of Onset   Lung cancer Mother    Heart disease Father    Heart attack Father    Healthy Sister    Healthy Daughter    Healthy Son    Anesthesia problems Neg Hx    Hypotension Neg Hx    Pseudochol deficiency Neg Hx     Prior to Admission medications   Medication Sig Start Date End Date Taking? Authorizing Provider  acetaminophen (TYLENOL) 500 MG tablet Take 500 mg by mouth every 6 (six) hours as needed for moderate pain (pain).    [provider]         aspirin EC 81 MG tablet Take 81 mg by mouth daily.    [provider]  atorvastatin (LIPITOR) 40 MG tablet Take 40 mg by mouth daily. 07/06/21   [provider]         canagliflozin (INVOKANA) 300 MG TABS tablet Take 300 mg by mouth daily before breakfast. 07/07/21   [provider]  carvedilol (COREG) 3.125 MG tablet Take 1 tablet (3.125 mg total) by mouth 2 (two) times daily with a meal. 10/11/21   Baldwin Jamaica, PA-C  Continuous Blood Gluc Receiver (FREESTYLE LIBRE 2 READER) DEVI by Does not apply route.    [provider]  cyclobenzaprine (FLEXERIL) 10 MG tablet Take 10 mg by mouth 3 (three) times daily as needed for muscle spasms.    [provider]  Dulaglutide (TRULICITY) 3 QM/0.8QP SOPN Inject 3 mg into the skin every Friday.    [provider]  famotidine (PEPCID) 20 MG tablet Take 20 mg by mouth daily.     [provider]  gabapentin (NEURONTIN) 100 MG capsule Take 2 capsules (200 mg total) by mouth at bedtime. Patient taking differently: Take 100 mg by mouth at bedtime. 02/24/19   Bo Merino, MD  Insulin Aspart FlexPen (NOVOLOG) 100 UNIT/ML Inject 5 Units into the skin daily. 08/24/21   [provider]  insulin glargine (LANTUS SOLOSTAR) 100 UNIT/ML Solostar Pen Inject 40 Units into the skin 2 (two) times daily. 01/26/21   [provider]  Lancets (ONETOUCH DELICA PLUS YPPJKD32I) [place] Apply topically. 02/20/21   [provider]  levothyroxine (SYNTHROID) 200 MCG tablet Take 200 mcg by mouth daily before breakfast. 08/25/21   [provider]  meloxicam (MOBIC) 7.5 MG tablet Take 7.5 mg by mouth daily as needed for pain.    [provider]  sacubitril-valsartan (ENTRESTO) 49-51 MG Take 1 tablet by mouth 2 (two) times daily.    [provider]  sertraline (ZOLOFT) 100 MG tablet Take 200 mg by mouth daily.     [provider]  Tamsulosin HCl (FLOMAX) 0.4 MG CAPS Take 1 capsule (0.4 mg total) by mouth every morning. 07/21/11   Carron Curie, PA-C    Physical Exam: Vitals:   03/22/22 1848 03/22/22 1906 03/22/22 1915 03/22/22 1930  BP:  (!) 211/98 (!) 206/101 (!) 158/85  Pulse:  76 71 70  Resp:  (!) 21 (!) 21 18  Temp: 98.2 F (36.8 C)     TempSrc: Oral     SpO2:  97% 96% 93%    Constitutional: NAD, calm, comfortable Eyes: PERRL, lids and conjunctivae normal, EOMI ENMT: Mucous membranes are moist.  Neck: normal, supple, no carotid bruits Respiratory: Breathing comfortably on room air, no wheezing Cardiovascular: RR, NR, no murmur noted, pacemaker in place in left upper chest Abdomen: +BS, NT, ND Musculoskeletal: normal tone and bulk for age Skin: no rashes, lesions, ulcers on exposed skin, chronic skin changes noted on arms Neurologic: He has a very mild left sided facial droop.  Sensation intact to light touch,  Strength 5/5 in all 4.  Speech and mentation normal.  Naming is normal.  Psychiatric: Normal judgment and insight. Alert and oriented x 3. Normal mood.    Labs on Admission: I have personally reviewed following labs and imaging studies  CBC: Recent Labs  Lab 03/22/22 1839 03/22/22 1844  WBC 9.2  --   NEUTROABS 5.4  --   HGB 15.5 15.3  HCT 45.4 45.0  MCV 88.2  --   PLT 159  --     Basic Metabolic Panel: Recent Labs  Lab 03/22/22 1839 03/22/22 1844  NA 140 140  K 3.9 3.9  CL 100 99  CO2 28  --   GLUCOSE 169* 172*  BUN 12 14  CREATININE 1.48* 1.50*  CALCIUM 9.7  --     GFR: CrCl cannot be calculated (Unknown ideal weight.).  Liver Function Tests: Recent Labs  Lab 03/22/22 1839  AST 30  ALT 29  ALKPHOS 71  BILITOT 1.8*  PROT 7.6  ALBUMIN 4.2    Urine analysis:    Component Value Date/Time   COLORURINE YELLOW 08/25/2021 0932   APPEARANCEUR CLEAR 08/25/2021 0932   LABSPEC 1.024 08/25/2021 0932   PHURINE 6.0 08/25/2021 0932   GLUCOSEU >=500 (A) 08/25/2021 0932   HGBUR SMALL (A) 08/25/2021 0932   BILIRUBINUR NEGATIVE 08/25/2021 0932   KETONESUR NEGATIVE 08/25/2021 0932   PROTEINUR 100 (A) 08/25/2021 0932   NITRITE NEGATIVE 08/25/2021 0932   LEUKOCYTESUR NEGATIVE 08/25/2021 0932    Radiological Exams on Admission: CT ANGIO HEAD NECK W WO CM (CODE STROKE)  Result Date: 03/22/2022 CLINICAL DATA:  Provided history: Left-sided facial droop. Weakness. Numbness. Last known well 1800. EXAM: CT ANGIOGRAPHY HEAD AND NECK TECHNIQUE: Multidetector CT imaging of the head and neck was performed using the standard protocol during bolus administration of intravenous contrast. Multiplanar CT image reconstructions and MIPs were obtained to evaluate the vascular anatomy. Carotid stenosis measurements (when applicable) are obtained utilizing NASCET criteria, using the distal internal carotid diameter as the denominator. RADIATION DOSE REDUCTION: This exam was performed  according to the departmental dose-optimization program which includes automated exposure control, adjustment of the  mA and/or kV according to patient size and/or use of iterative reconstruction technique. CONTRAST:  57m OMNIPAQUE IOHEXOL 350 MG/ML SOLN COMPARISON:  Noncontrast head CT performed earlier today 03/22/2022. FINDINGS: CTA NECK FINDINGS Aortic arch: The visualized aortic arch is normal in caliber. Standard aortic branching. Atherosclerotic plaque scattered within the proximal major branch vessels of the neck. No hemodynamically significant innominate or proximal subclavian artery stenosis. Right carotid system: CCA and ICA patent within the neck without stenosis. Minimal atherosclerotic plaque about the carotid bifurcation. Left carotid system: CCA and ICA patent within the neck without stenosis. Mild atherosclerotic plaque at the CCA origin and about the carotid bifurcation. Vertebral arteries: Vertebral arteries patent within the neck. Moderate atherosclerotic narrowing at the origin of the non dominant right vertebral artery. Venous reflux from a right-sided contrast bolus obscures portions of the V1 right vertebral artery. Within this limitation, there is no appreciable stenosis elsewhere within the cervical vertebral arteries. Skeleton: Cervical spondylosis. No acute fracture or aggressive osseous lesion. Other neck: Focus of asymmetric fat density within the right posterior neck, measuring 5.4 x 4.5 cm, compatible with a lipoma (for instance as seen on series 7, image 216) (series 910, image 91). No pathologically enlarged cervical chain lymph nodes. Upper chest: No consolidation within the imaged lung apices. Review of the MIP images confirms the above findings CTA HEAD FINDINGS Anterior circulation: The intracranial internal carotid arteries are patent. Atherosclerotic plaque within both vessels with no more than mild stenosis. The M1 middle cerebral arteries are patent. Atherosclerotic  irregularity of the MCA branches, bilaterally. No M2 proximal branch occlusion or high-grade proximal stenosis is identified. The anterior cerebral arteries are patent. No intracranial aneurysm is identified. Posterior circulation: The intracranial vertebral arteries are patent. The basilar artery is patent. The posterior cerebral arteries are patent. The posterior cerebral arteries are patent. A left posterior communicating artery is present. The right posterior communicating artery is diminutive or absent. Venous sinuses: Within the limitations of contrast timing, no convincing thrombus. Anatomic variants: As described Review of the MIP images confirms the above findings No emergent large vessel occlusion is identified. These results were communicated to Dr. KLeonel Ramsayat 7:18 pmon 11/23/2023by text page via the [facility]. IMPRESSION: CTA neck: 1. The common carotid and internal carotid arteries are patent within the neck without stenosis. Mild atherosclerotic plaque, bilaterally. 2. The vertebral arteries are patent within the neck. Moderate atherosclerotic narrowing at the origin of the non-dominant right vertebral artery. Venous reflux from a right-sided contrast bolus partially obscures the right V1 segment. Within this limitation, there is no appreciable stenosis elsewhere within the cervical vertebral arteries. 3. 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 4. Cervical spondylosis. CTA head: Intracranial atherosclerotic disease, as described. No intracranial large vessel occlusion or proximal high-grade arterial stenosis identified. Electronically Signed   By: KKellie SimmeringD.O.   On: 03/22/2022 19:19   CT HEAD CODE STROKE WO CONTRAST  Result Date: 03/22/2022 CLINICAL DATA:  Code stroke. Neuro deficit, acute, stroke suspected. Left-sided facial droop. Weakness. Numbness. EXAM: CT HEAD WITHOUT CONTRAST TECHNIQUE: Contiguous axial images were obtained from the base  of the skull through the vertex without intravenous contrast. RADIATION DOSE REDUCTION: This exam was performed according to the departmental dose-optimization program which includes automated exposure control, adjustment of the mA and/or kV according to patient size and/or use of iterative reconstruction technique. COMPARISON:  Report from head CT 12/15/2010 (images unavailable). FINDINGS: Brain: Mild generalized cerebral atrophy. Mild patchy  and ill-defined hypoattenuation within the cerebral white matter, nonspecific but compatible with chronic small vessel disease. There is no acute intracranial hemorrhage. No demarcated cortical infarct. No extra-axial fluid collection. No evidence of an intracranial mass. No midline shift. Vascular: No hyperdense vessel. Atherosclerotic calcifications. Skull: No fracture or aggressive osseous lesion. Sinuses/Orbits: No mass or acute finding within the imaged orbits. Minimal mucosal thickening scattered within the bilateral ethmoid air cells. ASPECTS ([place] Stroke Program Early CT Score) - Ganglionic level infarction (caudate, lentiform nuclei, internal capsule, insula, M1-M3 cortex): 7 - Supraganglionic infarction (M4-M6 cortex): 3 Total score (0-10 with 10 being normal): 10 These results were communicated to Dr. Leonel Ramsay at 6:58 pmon 11/23/2023by text page via the [facility] messaging system. IMPRESSION: No evidence of acute intracranial abnormality. Mild chronic small vessel ischemic changes within the cerebral white matter. Mild cerebral atrophy. Electronically Signed   By: Kellie Simmering D.O.   On: 03/22/2022 19:00      Assessment/Plan  Acute stroke due to ischemia ([place]) - Admit to telemetry - Neurology following - Repeat CT head in the AM - PCM is not compatible with MRI - Allow permissive HTN for 24 - 48 hours, < 220/110 - Monitor on telemetry - Neurochecks - TTE in the AM - Increase lipitor to '80mg'$  (reported being on '40mg'$  at home) - Check A1C and Lipid  panel - Risk factor modification - Swallow evaluation at bedside - PT/OT evaluations  Mild elevation in Creatinine, AKI - Family reports decreased PO intake at home - Hold renally dosed medications for 24 hours, meloxicam, entresto, invokana - IVF with NS at 75cc/hr for 5 hours - Recheck in the AM with CMET (Tbili also high at 1.8) - Restart home medications as indicated  Essential hypertension - Hold coreg, invokana, entresto for 24 - 48 hours as above    Type 2 diabetes mellitus with peripheral neuropathy (HCC) - SSI - Lantus 20 units BID (half of home dose) - Gabapentin '100mg'$  qhs - Resume dulaglutide and invokana on discharge    Hypothyroidism - Continue synthroid - Check TSH    Chronic systolic CHF (congestive heart failure) (HCC) Heart block - Currently euvolemic - Holding entresto, coreg overnight given acute stroke, permissive HTN and AKI - He has been out of invokana, can start back when renal function improves    S/P TAVR (transcatheter aortic valve replacement) - Monitor on telemetry    Gastroesophageal reflux disease - Continue pepcid    Mixed hyperlipidemia - Lipitor as noted above, he has intolerance to crestor and simvastatin    Moderate recurrent major depression (HCC) - Resume home sertraline  BPH - Resume home tamsulosin  DVT prophylaxis: Lovenox  Code Status:   Full  Family Communication:  Sone and sister at bedside  Disposition Plan:   Patient is from:  Home  Anticipated DC to:  Home  Anticipated DC date:  03/24/22  Anticipated DC barriers: Need for rehab or other home needs  Consults called:  Neurology, Dr. Leonel Ramsay  Admission status:  INP, Med Telemetry   Severity of Illness: The appropriate patient status for this patient is INPATIENT. Inpatient status is judged to be reasonable and necessary in order to provide the required intensity of service to ensure the patient's safety. The patient's presenting symptoms, physical exam findings,  and initial radiographic and laboratory data in the context of their chronic comorbidities is felt to place them at high risk for further clinical deterioration. Furthermore, it is not anticipated that the patient will be medically  stable for discharge from the hospital within 2 midnights of admission.   * I certify that at the point of admission it is my clinical judgment that the patient will require inpatient hospital care spanning beyond 2 midnights from the point of admission due to high intensity of service, high risk for further deterioration and high frequency of surveillance required.Gilles Chiquito MD [facility]  How to contact the [facility] Attending or Consulting provider [place] or covering provider during after hours [place], for this patient?   Check the care team in [facility] and look for a) attending/consulting [facility] provider listed and b) the [facility] team listed Log into [email or web address] and use [place]'s universal password to access. If you do not have the password, please contact the hospital operator. Locate the [facility] provider you are looking for under [facility] and page to a number that you can be directly reached. If you still have difficulty reaching the provider, please page the [facility] (Director on Call) for the Hospitalists listed on amion for assistance.  03/22/2022, 8:21 PM

## 2022-03-22 NOTE — ED Notes (Signed)
Pt reports going to his daughter's house to watch a game today. Reports LKW was at 1800. Reports left facial numbness and left arm numbness and weakness. Alert and oriented x 4. Denies hx of stroke. Denies hx of taking blood thinners. Taken to CT at [facility].

## 2022-03-22 NOTE — Consult Note (Signed)
Neurology Consultation Reason for Consult: Concern for stroke Referring Physician: Zenia Resides, A  CC: Numbness  History is obtained from: Patient  HPI: Tony [name] is a 71 y.o. male with a history of hypertension, TAVR, heart block status post pacemaker, who presents with facial weakness, dysarthria, and improving left-sided numbness.  He was in his normal state of health at 1800, and then a few minutes later noticed some numbness on the back of his hand.  This has waxed and waned, currently mostly resolved.  On arrival, he had some left arm drift but this is resolved.  He continues to have mild facial weakness and dysarthria and a code stroke was activated.   LKW: 50 tpa given?: no, mild symptoms  Past Medical History:  Diagnosis Date   Cancer ([place])    thyroid   CHF (congestive heart failure) (HCC)    Complete heart block ([place])    a. 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 Burt Knack and Dr. Cyndia Bent   Severe aortic stenosis    Sleep apnea      Family History  Problem Relation Age of Onset   Lung cancer Mother    Heart disease Father    Heart attack Father    Healthy Sister    Healthy Daughter    Healthy Son    Anesthesia problems Neg Hx    Hypotension Neg Hx    Pseudochol deficiency Neg Hx      Social History:  reports that he has never smoked. He has never used smokeless tobacco. He reports that he does not drink alcohol and does not use drugs.   Exam: Current vital signs: BP (!) 211/98   Pulse 76   Temp 98.2 F (36.8 C) (Oral)   Resp (!) 21   SpO2 97%  Vital signs in last 24  hours: Temp:  [98.2 F (36.8 C)] 98.2 F (36.8 C) (11/23 1848) Pulse Rate:  [74-82] 76 (11/23 1906) Resp:  [15-21] 21 (11/23 1906) BP: (191-217)/(98-117) 211/98 (11/23 1906) SpO2:  [96 %-98 %] 97 % (11/23 1906)   Physical Exam  Constitutional: Appears well-developed and well-nourished.  Psych: Affect appropriate to situation Eyes: No scleral injection HENT: No OP obstruction MSK: no joint deformities.  Cardiovascular: Normal rate and regular rhythm.  Respiratory: Effort normal, non-labored breathing GI: Soft.  No distension. There is no tenderness.  Skin: WDI  Neuro: Mental Status: Patient is awake, alert, oriented to person, place, month, year, and situation. Patient is able to give a clear and coherent history. No signs of aphasia or neglect Cranial Nerves: II: Visual Fields are full. Pupils are equal, round, and reactive to light.   III,IV, VI: EOMI without ptosis or diploplia.  V: Facial sensation is symmetric to temperature VII: Facial movement with mild left facial weakness VIII: hearing is intact to voice X: Uvula elevates symmetrically XI: Shoulder shrug is symmetric. XII: tongue is midline without atrophy or fasciculations.  Motor: Tone is normal. Bulk is normal. 5/5 strength was present in all four extremities.  Sensory: Sensation is  symmetric to light touch and temperature in the arms and legs.  He does have bilateral decreased to pinprick in the legs symmetrically Cerebellar: FNF and HKS are intact bilaterally   I have reviewed labs in epic and the results pertinent to this consultation are: Creatinine 1.5  I have reviewed the images obtained: CT/CTA-negative  Impression: 71 year old male with acute onset left-sided numbness, facial weakness, dysarthria most consistent with a small subcortical stroke.  At this time, with the mildness of his symptoms, I think that he would be more likely to be harmed then helped with IV tenecteplase.  He will need frequent  monitoring until outside the tenecteplase window at 10:30 PM.  He has an MRI incompatible pacemaker, so we will recommend repeat CT in the morning.  In the mildness of symptoms, it is possible that this stroke will not be visible.  He is already on atorvastatin 80 mg nightly, and currently takes monotherapy antiplatelet with aspirin.  Recommendations: - HgbA1c, fasting lipid panel - Frequent neuro checks - Echocardiogram - Prophylactic therapy-Antiplatelet med: Aspirin - dose '81mg'$  and plavix '75mg'$  daily  after '300mg'$  load  - Risk factor modification - Telemetry monitoring - PT consult, OT consult, Speech consult - Stroke team to follow    Roland Rack, MD [facility] [phone]  If 7pm- 7am, please page neurology on call as listed in [place].

## 2022-03-22 NOTE — ED Provider Notes (Signed)
[facility] EMERGENCY DEPARTMENT Provider Note   CSN: [id number] Arrival date & time: 03/22/22  1829     History  Chief Complaint  Patient presents with   Code Stroke    SHAWNTEZ DICKISON is a 71 y.o. male.  71 year old male presents after acute onset of left-sided facial droop with left hand paresthesias which started approximately at 6 PM tonight.  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  According to the daughter, who is physician, patient has had normal gait.  Has been no confusion.  No speech changes.  He denies any headache.  No emesis noted.  No recent falls.       Home Medications Prior to Admission medications   Medication Sig Start Date End Date Taking? Authorizing Provider  acetaminophen (TYLENOL) 500 MG tablet Take 500 mg by mouth every 6 (six) hours as needed for moderate pain (pain).    [provider]  amoxicillin (AMOXIL) 500 MG tablet Take 4 tablets (2,000 mg total) by mouth as directed. 1 hour prior to dental work including cleanings 09/07/21   Eileen Stanford, PA-C  aspirin EC 81 MG tablet Take 81 mg by mouth daily.    [provider]  atorvastatin (LIPITOR) 40 MG tablet Take 40 mg by mouth daily. 07/06/21   [provider]  BD PEN NEEDLE NANO 2ND GEN 32G X 4 MM MISC  01/26/21   [provider]  canagliflozin (INVOKANA) 300 MG TABS tablet Take 300 mg by mouth daily before breakfast. 07/07/21   [provider]  carvedilol (COREG) 3.125 MG tablet Take 1 tablet (3.125 mg total) by mouth 2 (two) times daily with a meal. 10/11/21   Baldwin Jamaica, PA-C  Continuous Blood Gluc Receiver (FREESTYLE LIBRE 2 READER) DEVI by Does not apply route.    [provider]  cyclobenzaprine (FLEXERIL) 10 MG tablet Take 10 mg by mouth 3 (three) times daily as needed for muscle spasms.    [provider]  Dulaglutide (TRULICITY) 3 HK/7.4QV SOPN Inject 3 mg into  the skin every Friday.    [provider]  famotidine (PEPCID) 20 MG tablet Take 20 mg by mouth daily.    [provider]  gabapentin (NEURONTIN) 100 MG capsule Take 2 capsules (200 mg total) by mouth at bedtime. Patient taking differently: Take 100 mg by mouth at bedtime. 02/24/19   Bo Merino, MD  Insulin Aspart FlexPen (NOVOLOG) 100 UNIT/ML Inject 5 Units into the skin daily. 08/24/21   [provider]  insulin glargine (LANTUS SOLOSTAR) 100 UNIT/ML Solostar Pen Inject 40 Units into the skin 2 (two) times daily. 01/26/21   [provider]  Lancets (ONETOUCH DELICA PLUS ZDGLOV56E) [place] Apply topically. 02/20/21   [provider]  levothyroxine (SYNTHROID) 200 MCG tablet Take 200 mcg by mouth daily before breakfast. 08/25/21   [provider]  meloxicam (MOBIC) 7.5 MG tablet Take 7.5 mg by mouth daily as needed for pain.    [provider]  sacubitril-valsartan (ENTRESTO) 49-51 MG Take 1 tablet by mouth 2 (two) times daily.    [provider]  sertraline (ZOLOFT) 100 MG tablet Take 200 mg by mouth daily.     [provider]  Tamsulosin HCl (FLOMAX) 0.4 MG CAPS Take 1 capsule (0.4 mg total) by mouth every morning. 07/21/11   Carron Curie, PA-C      Allergies    Corticosteroids, Codeine, Crestor [rosuvastatin calcium], Metformin  hcl, and Simvastatin    Review of Systems   Review of Systems  All other systems reviewed and are negative.   Physical Exam Updated Vital Signs BP (!) 217/109   Pulse 82   Resp 20   SpO2 96%  Physical Exam Vitals and nursing note reviewed.  Constitutional:      General: He is not in acute distress.    Appearance: Normal appearance. He is well-developed. He is not toxic-appearing.  HENT:     Head: Normocephalic and atraumatic.  Eyes:     General: Lids are normal.     Conjunctiva/sclera: Conjunctivae normal.     Pupils: Pupils are equal, round, and reactive to light.   Neck:     Thyroid: No thyroid mass.     Trachea: No tracheal deviation.  Cardiovascular:     Rate and Rhythm: Normal rate and regular rhythm.     Heart sounds: Normal heart sounds. No murmur heard.    No gallop.  Pulmonary:     Effort: Pulmonary effort is normal. No respiratory distress.     Breath sounds: Normal breath sounds. No stridor. No decreased breath sounds, wheezing, rhonchi or rales.  Abdominal:     General: There is no distension.     Palpations: Abdomen is soft.     Tenderness: There is no abdominal tenderness. There is no rebound.  Musculoskeletal:        General: No tenderness. Normal range of motion.     Cervical back: Normal range of motion and neck supple.  Skin:    General: Skin is warm and dry.     Findings: No abrasion or rash.  Neurological:     Mental Status: He is alert and oriented to person, place, and time. Mental status is at baseline.     GCS: GCS eye subscore is 4. GCS verbal subscore is 5. GCS motor subscore is 6.     Cranial Nerves: Facial asymmetry present. No cranial nerve deficit.     Sensory: No sensory deficit.     Motor: Motor function is intact.     Comments: Slight left-sided facial droop appreciated.  Speech is normal.  Psychiatric:        Attention and Perception: Attention normal.        Speech: Speech normal.        Behavior: Behavior normal.     ED Results / Procedures / Treatments   Labs (all labs ordered are listed, but only abnormal results are displayed) Labs Reviewed  PROTIME-INR  APTT  CBC  DIFFERENTIAL  COMPREHENSIVE METABOLIC PANEL  ETHANOL  RAPID URINE DRUG SCREEN, HOSP PERFORMED  URINALYSIS, ROUTINE W REFLEX MICROSCOPIC  I-STAT CHEM 8, ED    EKG None  Radiology No results found.  Procedures Procedures    Medications Ordered in ED Medications - No data to display  ED Course/ Medical Decision Making/ A&P                           Medical Decision Making Amount and/or Complexity of Data  Reviewed Labs: ordered. Radiology: ordered.   Patient here complaining of CVA type symptoms.  CT of head and CT angio of head without evidence of acute stroke.  Clinically patient likely has had a mild stroke.  Has been seen by Dr. Leonel Ramsay from neurology who request that patient be admitted to the hospital service.  Does not feel that he qualifies further thrombolytics at this time.  Discussed case at length with daughter as well as other relative who are at bedside.  They are agreeable to plan at this time.  CRITICAL CARE Performed by: Leota Jacobsen Total critical care time: 45 minutes Critical care time was exclusive of separately billable procedures and treating other patients. Critical care was necessary to treat or prevent imminent or life-threatening deterioration. Critical care was time spent personally by me on the following activities: development of treatment plan with patient and/or surrogate as well as nursing, discussions with consultants, evaluation of patient's response to treatment, examination of patient, obtaining history from patient or surrogate, ordering and performing treatments and interventions, ordering and review of laboratory studies, ordering and review of radiographic studies, pulse oximetry and re-evaluation of patient's condition.         Final Clinical Impression(s) / ED Diagnoses Final diagnoses:  None    Rx / DC Orders ED Discharge Orders     None         Lacretia Leigh, MD 03/22/22 1928

## 2022-03-23 ENCOUNTER — Inpatient Hospital Stay (HOSPITAL_COMMUNITY): Payer: Medicare PPO

## 2022-03-23 ENCOUNTER — Inpatient Hospital Stay (HOSPITAL_BASED_OUTPATIENT_CLINIC_OR_DEPARTMENT_OTHER): Payer: Medicare PPO

## 2022-03-23 ENCOUNTER — Other Ambulatory Visit (HOSPITAL_COMMUNITY): Payer: Self-pay

## 2022-03-23 DIAGNOSIS — I639 Cerebral infarction, unspecified: Secondary | ICD-10-CM | POA: Diagnosis not present

## 2022-03-23 DIAGNOSIS — I6389 Other cerebral infarction: Secondary | ICD-10-CM | POA: Diagnosis not present

## 2022-03-23 LAB — CBC
HCT: 39.3 % (ref 39.0–52.0)
Hemoglobin: 13.7 g/dL (ref 13.0–17.0)
MCH: 30 pg (ref 26.0–34.0)
MCHC: 34.9 g/dL (ref 30.0–36.0)
MCV: 86 fL (ref 80.0–100.0)
Platelets: 142 10*3/uL — ABNORMAL LOW (ref 150–400)
RBC: 4.57 MIL/uL (ref 4.22–5.81)
RDW: 12.9 % (ref 11.5–15.5)
WBC: 9.1 10*3/uL (ref 4.0–10.5)
nRBC: 0 % (ref 0.0–0.2)

## 2022-03-23 LAB — ECHOCARDIOGRAM COMPLETE
AR max vel: 1.95 cm2
AV Area VTI: 2.08 cm2
AV Area mean vel: 1.97 cm2
AV Mean grad: 7 mmHg
AV Peak grad: 12.5 mmHg
Ao pk vel: 1.77 m/s
Area-P 1/2: 2.38 cm2
Calc EF: 41.6 %
Height: 72 in
MV M vel: 3.47 m/s
MV Peak grad: 48.2 mmHg
S' Lateral: 3.4 cm
Single Plane A2C EF: 40.6 %
Single Plane A4C EF: 41.2 %
Weight: 3904.79 oz

## 2022-03-23 LAB — LIPID PANEL
Cholesterol: 141 mg/dL (ref 0–200)
HDL: 36 mg/dL — ABNORMAL LOW (ref >40)
LDL Cholesterol: 75 mg/dL (ref 0–99)
Total CHOL/HDL Ratio: 3.9 RATIO
Triglycerides: 152 mg/dL — ABNORMAL HIGH (ref <150)
VLDL: 30 mg/dL (ref 0–40)

## 2022-03-23 LAB — BASIC METABOLIC PANEL
Anion gap: 12 (ref 5–15)
BUN: 11 mg/dL (ref 8–23)
CO2: 24 mmol/L (ref 22–32)
Calcium: 9 mg/dL (ref 8.9–10.3)
Chloride: 103 mmol/L (ref 98–111)
Creatinine, Ser: 1.28 mg/dL — ABNORMAL HIGH (ref 0.61–1.24)
GFR, Estimated: 59 mL/min — ABNORMAL LOW (ref >60)
Glucose, Bld: 105 mg/dL — ABNORMAL HIGH (ref 70–99)
Potassium: 3.7 mmol/L (ref 3.5–5.1)
Sodium: 139 mmol/L (ref 135–145)

## 2022-03-23 LAB — GLUCOSE, CAPILLARY
Glucose-Capillary: 131 mg/dL — ABNORMAL HIGH (ref 70–99)
Glucose-Capillary: 176 mg/dL — ABNORMAL HIGH (ref 70–99)

## 2022-03-23 MED ORDER — ASPIRIN 81 MG PO TBEC
81.0000 mg | DELAYED_RELEASE_TABLET | Freq: Every day | ORAL | 0 refills | Status: AC
  Filled 2022-03-23: qty 21, 21d supply, fill #0

## 2022-03-23 MED ORDER — SODIUM CHLORIDE 0.9 % IV SOLN: INTRAVENOUS | Status: DC

## 2022-03-23 MED ORDER — CLOPIDOGREL BISULFATE 75 MG PO TABS
75.0000 mg | ORAL_TABLET | Freq: Every day | ORAL | 0 refills | Status: AC
  Filled 2022-03-23: qty 30, 30d supply, fill #0

## 2022-03-23 MED ORDER — HYDRALAZINE HCL 25 MG PO TABS
25.0000 mg | ORAL_TABLET | Freq: Four times a day (QID) | ORAL | 0 refills | Status: DC
  Filled 2022-03-23: qty 120, 30d supply, fill #0

## 2022-03-23 MED ORDER — EZETIMIBE 10 MG PO TABS
10.0000 mg | ORAL_TABLET | Freq: Every day | ORAL | Status: DC
  Administered 2022-03-23: 10 mg via ORAL
  Filled 2022-03-23: qty 1

## 2022-03-23 NOTE — Evaluation (Signed)
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   Fatty liver 09/07/2021   Gastroesophageal reflux disease 09/07/2021   Heart block 09/07/2021   Hypercholesterolemia 09/07/2021   Mixed hyperlipidemia 09/07/2021   Hyperglycemia due to type 2 diabetes mellitus ([place]) 09/07/2021   Long term (current) use of insulin ([place]) 09/07/2021   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 associated with type 2 diabetes mellitus ([place]) 09/07/2021   Mitral regurgitation 09/07/2021   Moderate recurrent major depression ([place]) 09/07/2021   Obesity 09/07/2021   Severe recurrent major depression without psychotic features ([place]) 09/07/2021   S/P TAVR (transcatheter aortic valve replacement) 60/63/0160   Chronic systolic CHF (congestive heart failure) ([place]) 11/04/2019   Pacemaker 11/04/2019   Ganglion cyst 12/19/2015   Severe aortic stenosis 09/01/2014   Essential hypertension 09/01/2014   Type 2 diabetes mellitus with peripheral neuropathy ([place]) 09/01/2014   Hypothyroidism 09/01/2014   OSA on CPAP 09/01/2014   Lumbar stenosis 07/16/2011   HNP (herniated nucleus pulposus), lumbar 07/16/2011   Past Medical History:  Past Medical History:  Diagnosis Date   Cancer ([place])    thyroid   CHF (congestive heart failure) ([place])    Complete heart block ([place])    a. s/p MDT dual chamber PPM followed by Dr [name]    Complication of anesthesia    1985 after Thyriodectomy hard time waking up   Diabetes mellitus    GERD (gastroesophageal reflux disease)    Hypertension    Hypothyroidism    Had two surgeries for Cancer   Presence of permanent cardiac pacemaker    S/P TAVR (transcatheter  aortic valve replacement) 08/29/2021   s/p TAVR with a 29 mm Edwards S3UR via the TF approach by Dr. Burt Knack and Dr. Cyndia Bent   Severe aortic stenosis    Sleep apnea    Past Surgical History:  Past Surgical History:  Procedure Laterality Date   CARDIAC CATHETERIZATION     INTRAOPERATIVE TRANSTHORACIC ECHOCARDIOGRAM N/A 08/29/2021   Procedure: INTRAOPERATIVE TRANSTHORACIC ECHOCARDIOGRAM;  Surgeon: [name], MD;  Location: [facility];  Service: Open Heart Surgery;  Laterality: N/A;   LUMBAR LAMINECTOMY/DECOMPRESSION MICRODISCECTOMY  07/19/2011   Procedure: LUMBAR LAMINECTOMY/DECOMPRESSION MICRODISCECTOMY 3 LEVELS;  Surgeon: Melina Schools, MD;  Location: [facility];  Service: Orthopedics;  Laterality: Left;  Lumbar three-Lumbar five LEFT DECOMPRESSION AND FORAMINOTOMY Lumbar three-four LEFT DISCECTOMY   PERMANENT PACEMAKER INSERTION N/A 08/11/2014   MDT Adapta L implanted by Dr [name] for CHB   RIGHT HEART CATH AND CORONARY ANGIOGRAPHY N/A 08/07/2021   Procedure: RIGHT HEART CATH AND CORONARY ANGIOGRAPHY;  Surgeon: [name], MD;  Location: [facility];  Service: Cardiovascular;  Laterality: N/A;   RIGHT/LEFT HEART CATH AND CORONARY ANGIOGRAPHY N/A 08/07/2021   Procedure: RIGHT/LEFT HEART CATH AND CORONARY ANGIOGRAPHY;  Surgeon: [name], MD;  Location: [facility];  Service: Cardiovascular;  Laterality: N/A;   Thyroidectomy x2     TRANSCATHETER AORTIC VALVE REPLACEMENT, TRANSFEMORAL N/A 08/29/2021   Procedure: Transcatheter Aortic Valve Replacement, Transfemoral;  Surgeon: [name], MD;  Location: [facility];  Service: Open Heart Surgery;  Laterality: N/A;   HPI:  Tony [name] is a 71  y.o. 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  Due to the mildness and resolving nature of his symptoms, TNK was not  administered.  His pacemaker is not MRI compatible, so repeat head CT was performed not showing stroke.  This is unsurprising given mild nature of symptoms.   Assessment / Plan / Recommendation Clinical Impression  Pt demonstrates no discernible dysarthria to this unfamiliar listener. No family to give input on lingering change in speech. Pt is satisfied with his own speech. SLP provided education about potential for slight change in speech with fatigue, also reviewed BE FAST. No SLP f/u needed at this time.    SLP Assessment  SLP Recommendation/Assessment: Patient does not need any further Speech Lanaguage Pathology Services    Recommendations for follow up therapy are one component of a multi-disciplinary discharge planning process, led by the attending physician.  Recommendations may be updated based on patient status, additional functional criteria and insurance authorization.    Follow Up Recommendations  No SLP follow up    Assistance Recommended at Discharge     Functional Status Assessment    Frequency and Duration           SLP Evaluation Cognition  Overall Cognitive Status: Within Functional Limits for tasks assessed       Comprehension  Auditory Comprehension Overall Auditory Comprehension: Appears within functional limits for tasks assessed    Expression Verbal Expression Overall Verbal Expression: Appears within functional limits for tasks assessed   Oral / Motor  Motor Speech Overall Motor Speech: Appears within functional limits for tasks assessed            Dynastie Knoop, Katherene Ponto 03/23/2022, 1:01 PM

## 2022-03-23 NOTE — TOC Transition Note (Signed)
Transition of Care [facility]) - CM/SW Discharge Note   Patient Details  Name: DAYVIAN BLIXT MRN: [id number] Date of Birth: 1951/03/11  [facility] CM/SW Contact:  Pollie Friar, RN Phone Number: 03/23/2022, 2:44 PM   Clinical Narrative:    Pt is discharging home with outpatient therapy. Pt states he lives alone but will be staying nights with either his son or daughter.  Pt states they can provide needed transportation. He manages his own medications but will have daughter over see them.  Walker for home to be delivered to the room per [employer].  Family to transport home.    Final next level of care: OP Rehab Barriers to Discharge: No Barriers Identified   Patient Goals and CMS Choice   CMS Medicare.gov Compare Post Acute Care list provided to:: Patient Choice offered to / list presented to : Patient  Discharge Placement                       Discharge Plan and Services                DME Arranged: Walker rolling DME Agency: [employer] Date DME Agency Contacted: 03/23/22   Representative spoke with at DME Agency: Annie Sable            Social Determinants of Health ([place]) Interventions Food Insecurity Interventions: Intervention Not Indicated Housing Interventions: Intervention Not Indicated Transportation Interventions: Intervention Not Indicated Utilities Interventions: Intervention Not Indicated   Readmission Risk Interventions    08/30/2021   12:53 PM  Readmission Risk Prevention Plan  Post Dischage Appt Complete  Medication Screening Complete  Transportation Screening Complete

## 2022-03-23 NOTE — Care Management CC44 (Signed)
Condition Code 44 Documentation Completed  Patient Details  Name: Tony [name] MRN: [id number] Date of Birth: 1950-07-13   Condition Code 44 given:  Yes Patient signature on Condition Code 44 notice:  Yes Documentation of 2 MD's agreement:  Yes Code 44 added to claim:  Yes    Pollie Friar, RN 03/23/2022, 2:51 PM

## 2022-03-23 NOTE — Discharge Summary (Addendum)
Physician Discharge Summary  Tony [name] [id number]:[id number] DOB: Oct 28, 1950 DOA: 03/22/2022  PCP: Kathalene Frames, MD  Admit date: 03/22/2022 Discharge date: 03/23/2022 30 Day Unplanned Readmission Risk Score    Flowsheet Row ED to Hosp-Admission (Current) from 03/22/2022 in [facility]  30 Day Unplanned Readmission Risk Score (%) 11.78 Filed at 03/23/2022 1200       This score is the patient's risk of an unplanned readmission within 30 days of being discharged (0 -100%). The score is based on dignosis, age, lab data, medications, orders, and past utilization.   Low:  0-14.9   Medium: 15-21.9   High: 22-29.9   Extreme: 30 and above          Admitted From: Home Disposition: Home  Recommendations for Outpatient Follow-up:  Follow up with PCP in 1-2 weeks Please obtain BMP/CBC in one week Follow-up with neurology in 4 weeks Please follow up with your PCP on the following pending results: Unresulted Labs (From admission, onward)     Start     Ordered   03/29/22 0500  Creatinine, serum  (enoxaparin (LOVENOX)    CrCl >/= 30 ml/min)  Weekly,   R     Comments: while on enoxaparin therapy    03/22/22 2124   03/22/22 2125  Hemoglobin A1c  (Labs)  Once,   R       Comments: To assess prior glycemic control    03/22/22 2124              Home Health: None Equipment/Devices: Rolling walker  Discharge Condition: Stable CODE STATUS: Full code Diet recommendation: Cardiac  Subjective: Seen and examined, he says that he is feeling much better, he does not have any numbness in the hands that he had yesterday, does not have any dysarthria or dysphagia or any other complaint.  HPI: Tony [name] is a 71 y.o. 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  Per patient, he was at home, had  just eaten an ice cream sandwich, when he felt tingling in the right side of his face, heaviness of the left hand and had a facial droop per family.  His daughter is an anesthesiologist and his son is a paramedic, they responded quickly and he was brought to the ED.  His symptoms were improving.  CT and CTA of the head and neck did not show acute stroke.  He has an MRI incompatible PCM, so he cannot get an MRI.  He was seen by Neurology.  Per family and patient, he has had chronic issues with lightheadedness, low blood pressures and disequilibrium since having his TAVR procedure.  He has been in rehab and has started getting strength back. He has chronic right sided blindness, due to previous CVA per report from family.  This was about 10 years ago.  He is a nonsmoker.  He is mostly compliant with his medications, however, he has been out of invokana (on back order) and gabapentin for about 3 weeks.    ED Course: In the ED, Neurology was consulted.  CT head showed mild chronic changes.  CTA of the head and neck showed some moderate intracranial atherosclerosis.  Labs showed an elevated Cr to 1.48, Glucose of 169.  Baseline Cr appears to be around 1.1 - 1.2.  T bili of 1.8, GFR  of 50, H/H 15 and 45.  INR 1.0.  Neurology did not feel TPA benefits outweighed risk.  Admit to medicine.   Brief/Interim Summary: Patient was admitted under hospitalist service for stroke workup and neurology consulted.  Acute stroke due to ischemia [facility]): presenting with facial weakness, dysarthria and left-sided numbness, which improved after presentation.  Due to the mildness and resolving nature of his symptoms, TNK was not administered.  His pacemaker is not MRI compatible, so repeat head CT was performed not showing stroke however neurology believes that he likely has small right-sided stroke which is yet not seen on CT head.  CTA head and neck showed no LVO or hemodynamically significant stenosis.  Neurology started him on  Plavix, in addition to aspirin that he was taking prior to admission and has recommended discharging on DAPT for 3 weeks and then continuing Plavix but is stopping aspirin.  Resuming home atorvastatin 40 mg p.o. daily.  At the moment, patient does not have any complaint, no dysarthria or numbness or tingling, he has mild left facial droop though.  He has been assessed by PT OT who recommend outpatient OT PT.  Neurology has also cleared the patient for discharge.  It is noteworthy that patient's blood pressure is significantly elevated with last reading of 193/80.  I did discuss this with neurology and they recommended allowing permissive hypertension until at least 24 hours since admission which will be around 7 PM today.  I was informed by the nursing that patient's family would prefer to take him home and out of my concern with elevated blood pressure, I called patient's daughter Jinny Blossom who happens to be an anesthesiologist herself and informed her about elevated blood pressure, she was very clear stating that she feels very comfortable taking care of her father at home as she is a physician herself and her brother is EMT personnel as well.  We discussed about allowing permissive hypertension until 7 PM and then if elevated to resume patient's home dose of Coreg.  In addition to that, I have prescribed the patient hydralazine 25 mg p.o. 3 times daily only to be taken if patient's blood pressure is not under control or under goal.  She verbalized understanding.  Patient's transthoracic echo was also done which showed slightly worsened diastolic dysfunction, previously he had grade 2 and now he has grade 3 diastolic dysfunction with severe left ventricular hypertrophy.  This was also discussed with the daughter.   CKD stage IIIa, AKI ruled out. Creatinine at baseline.   Essential hypertension As mentioned above.    Type 2 diabetes mellitus with peripheral neuropathy (HCC) Home medications.     Hypothyroidism - Continue synthroid   Discharge plan was discussed with patient and/or family member and they verbalized understanding and agreed with it.  Discharge Diagnoses:  Principal Problem:   Acute stroke due to ischemia [facility]) Active Problems:   Essential hypertension   Type 2 diabetes mellitus with peripheral neuropathy (HCC)   Hypothyroidism   Chronic systolic CHF (congestive heart failure) (HCC)   S/P TAVR (transcatheter aortic valve replacement)   Gastroesophageal reflux disease   Heart block   Mixed hyperlipidemia   Moderate recurrent major depression [facility])    Discharge Instructions  Discharge Instructions     Ambulatory referral to Occupational Therapy   Complete by: As directed    Ambulatory referral to Physical Therapy   Complete by: As directed       Allergies as of 03/23/2022  Reactions   Corticosteroids    Per patient's daughter patient is not allergic as of 07/21/21.   Codeine Itching   Crestor [rosuvastatin Calcium] Other (See Comments)   Leg pain   Metformin Hcl Diarrhea   Simvastatin Other (See Comments)   Leg pain        Medication List     TAKE these medications    acetaminophen 500 MG tablet Commonly known as: TYLENOL Take 500 mg by mouth every 6 (six) hours as needed for moderate pain (pain).   amoxicillin 500 MG tablet Commonly known as: AMOXIL Take 4 tablets (2,000 mg total) by mouth as directed. 1 hour prior to dental work including cleanings   aspirin EC 81 MG tablet Take 1 tablet (81 mg total) by mouth daily for 21 days.   atorvastatin 40 MG tablet Commonly known as: LIPITOR Take 40 mg by mouth daily.   BD Pen Needle Nano 2nd Gen 32G X 4 MM Misc Generic drug: Insulin Pen Needle   canagliflozin 300 MG Tabs tablet Commonly known as: INVOKANA Take 300 mg by mouth daily before breakfast.   carvedilol 3.125 MG tablet Commonly known as: COREG Take 1 tablet (3.125 mg total) by mouth 2 (two) times daily with a  meal.   clopidogrel 75 MG tablet Commonly known as: PLAVIX Take 1 tablet (75 mg total) by mouth daily. Start taking on: March 24, 2022   cyclobenzaprine 10 MG tablet Commonly known as: FLEXERIL Take 10 mg by mouth 3 (three) times daily as needed for muscle spasms.   Dulaglutide 4.5 MG/0.5ML Sopn Inject 4.5 mg into the skin every Friday.   famotidine 20 MG tablet Commonly known as: PEPCID Take 20 mg by mouth daily.   FreeStyle Libre 2 Reader [employer] by Does not apply route.   gabapentin 100 MG capsule Commonly known as: NEURONTIN Take 2 capsules (200 mg total) by mouth at bedtime. What changed: how much to take   hydrALAZINE 25 MG tablet Commonly known as: APRESOLINE Take 1 tablet (25 mg total) by mouth 4 (four) times daily.   Insulin Aspart FlexPen 100 UNIT/ML Commonly known as: NOVOLOG Inject 5 Units into the skin daily.   Lantus SoloStar 100 UNIT/ML Solostar Pen Generic drug: insulin glargine Inject 40 Units into the skin 2 (two) times daily.   levothyroxine 175 MCG tablet Commonly known as: SYNTHROID Take 175 mcg by mouth daily before breakfast.   omeprazole 10 MG capsule Commonly known as: PRILOSEC Take 10 mg by mouth daily.   OneTouch Delica Plus [id number] Misc Apply topically.   sacubitril-valsartan 24-26 MG Commonly known as: ENTRESTO Take 1 tablet by mouth daily.   sertraline 100 MG tablet Commonly known as: ZOLOFT Take 200 mg by mouth daily.   tamsulosin 0.4 MG Caps capsule Commonly known as: FLOMAX Take 1 capsule (0.4 mg total) by mouth every morning.        Follow-up Information     Kathalene Frames, MD Follow up in 1 week(s).   Specialty: Internal Medicine Contact information: [address], [place] [address] [phone]                Allergies  Allergen Reactions   Corticosteroids     Per patient's daughter patient is not allergic as of 07/21/21.   Codeine Itching   Crestor [Rosuvastatin  Calcium] Other (See Comments)    Leg pain   Metformin Hcl Diarrhea   Simvastatin Other (See Comments)    Leg pain  Consultations: Neurology   Procedures/Studies: ECHOCARDIOGRAM COMPLETE  Result Date: 03/23/2022    ECHOCARDIOGRAM REPORT   Patient Name:   Tony [name] Date of Exam: 03/23/2022 Medical Rec #:  [id number]       Height:       72.0 in Accession #:    [id number]      Weight:       244.0 lb Date of Birth:  1950-08-21       BSA:          2.319 m Patient Age:    5 years        BP:           193/80 mmHg Patient Gender: M               HR:           65 bpm. Exam Location:  Inpatient Procedure: 2D Echo, Color Doppler and Cardiac Doppler Indications:    Stroke  History:        Patient has prior history of Echocardiogram examinations, most                 recent 09/27/2021. CHF, Pacemaker, Arrythmias:CHB; Risk                 Factors:Hypertension, Diabetes and Sleep Apnea.                 Aortic Valve: 29 mm Edwards Sapien prosthetic, stented (TAVR)                 valve is present in the aortic position. Procedure Date: 08/29/21.  Sonographer:    Eartha Inch Referring Phys: Kellen.Fila EMILY B MULLEN  Sonographer Comments: Image acquisition challenging due to patient body habitus and Image acquisition challenging due to respiratory motion. IMPRESSIONS  1. Left ventricular ejection fraction, by estimation, is 40 to 45%. The left ventricle has mildly decreased function. The left ventricle demonstrates global hypokinesis. There is severe concentric left ventricular hypertrophy. Left ventricular diastolic  parameters are consistent with Grade III diastolic dysfunction (restrictive).  2. Right ventricular systolic function is normal. The right ventricular size is normal. There is normal pulmonary artery systolic pressure. The estimated right ventricular systolic pressure is 81.8 mmHg.  3. Left atrial size was mildly dilated.  4. The mitral valve is normal in structure. Mild mitral valve regurgitation. No  evidence of mitral stenosis.  5. The aortic valve has been repaired/replaced. Aortic valve regurgitation is not visualized. There is a 29 mm Edwards Sapien prosthetic (TAVR) valve present in the aortic position. Procedure Date: 08/29/21. Effective orifice area, by VTI measures 2.08 cm. Aortic valve mean gradient measures 7.0 mmHg.  6. Aortic dilatation noted. There is mild dilatation of the ascending aorta, measuring 40 mm. Comparison(s): No significant change from prior study. FINDINGS  Left Ventricle: Left ventricular ejection fraction, by estimation, is 40 to 45%. The left ventricle has mildly decreased function. The left ventricle demonstrates global hypokinesis. The left ventricular internal cavity size was normal in size. There is  severe concentric left ventricular hypertrophy. Left ventricular diastolic parameters are consistent with Grade III diastolic dysfunction (restrictive). Right Ventricle: The right ventricular size is normal. No increase in right ventricular wall thickness. Right ventricular systolic function is normal. There is normal pulmonary artery systolic pressure. The tricuspid regurgitant velocity is 2.44 m/s, and  with an assumed right atrial pressure of 3 mmHg, the estimated right ventricular systolic pressure is 56.3 mmHg. Left Atrium: Left atrial size was  mildly dilated. Right Atrium: Right atrial size was normal in size. Pericardium: Trivial pericardial effusion is present. The pericardial effusion is posterior to the left ventricle. Mitral Valve: The mitral valve is normal in structure. Mild mitral valve regurgitation. No evidence of mitral valve stenosis. Tricuspid Valve: The tricuspid valve is grossly normal. Tricuspid valve regurgitation is mild . No evidence of tricuspid stenosis. Aortic Valve: The aortic valve has been repaired/replaced. Aortic valve regurgitation is not visualized. Aortic valve mean gradient measures 7.0 mmHg. Aortic valve peak gradient measures 12.5 mmHg. Aortic  valve area, by VTI measures 2.08 cm. There is a 29 mm Edwards Sapien prosthetic, stented (TAVR) valve present in the aortic position. Procedure Date: 08/29/21. Pulmonic Valve: The pulmonic valve was normal in structure. Pulmonic valve regurgitation is trivial. No evidence of pulmonic stenosis. Aorta: Aortic dilatation noted. There is mild dilatation of the ascending aorta, measuring 40 mm. IAS/Shunts: No atrial level shunt detected by color flow Doppler. Additional Comments: A device lead is visualized in the right ventricle and right atrium.  LEFT VENTRICLE PLAX 2D LVIDd:         4.80 cm      Diastology LVIDs:         3.40 cm      LV e' medial:    2.25 cm/s LV PW:         1.50 cm      LV E/e' medial:  32.8 LV IVS:        1.50 cm      LV e' lateral:   2.03 cm/s LVOT diam:     2.10 cm      LV E/e' lateral: 36.3 LV SV:         71 LV SV Index:   31 LVOT Area:     3.46 cm  LV Volumes (MOD) LV vol d, MOD A2C: 162.0 ml LV vol d, MOD A4C: 243.0 ml LV vol s, MOD A2C: 96.2 ml LV vol s, MOD A4C: 143.0 ml LV SV MOD A2C:     65.8 ml LV SV MOD A4C:     243.0 ml LV SV MOD BP:      85.7 ml RIGHT VENTRICLE             IVC RV S prime:     11.00 cm/s  IVC diam: 1.20 cm TAPSE (M-mode): 2.1 cm LEFT ATRIUM           Index        RIGHT ATRIUM           Index LA diam:      4.90 cm 2.11 cm/m   RA Area:     13.10 cm LA Vol (A4C): 80.5 ml 34.72 ml/m  RA Volume:   31.70 ml  13.67 ml/m  AORTIC VALVE AV Area (Vmax):    1.95 cm AV Area (Vmean):   1.97 cm AV Area (VTI):     2.08 cm AV Vmax:           176.50 cm/s AV Vmean:          125.500 cm/s AV VTI:            0.342 m AV Peak Grad:      12.5 mmHg AV Mean Grad:      7.0 mmHg LVOT Vmax:         99.20 cm/s LVOT Vmean:        71.400 cm/s LVOT VTI:  0.205 m LVOT/AV VTI ratio: 0.60  AORTA Ao Root diam: 2.70 cm Ao Asc diam:  4.00 cm MITRAL VALVE                TRICUSPID VALVE MV Area (PHT): 2.38 cm     TR Peak grad:   23.8 mmHg MV Decel Time: 319 msec     TR Mean grad:   14.0 mmHg MR  Peak grad: 48.2 mmHg     TR Vmax:        244.00 cm/s MR Vmax:      347.00 cm/s   TR Vmean:       177.0 cm/s MV E velocity: 73.70 cm/s MV A velocity: 107.00 cm/s  SHUNTS MV E/A ratio:  0.69         Systemic VTI:  0.20 m                             Systemic Diam: 2.10 cm Rudean Haskell MD Electronically signed by Rudean Haskell MD Signature Date/Time: 03/23/2022/12:37:22 PM    Final    CT HEAD WO CONTRAST (5MM)  Result Date: 03/23/2022 CLINICAL DATA:  Follow-up stroke. EXAM: CT HEAD WITHOUT CONTRAST TECHNIQUE: Contiguous axial images were obtained from the base of the skull through the vertex without intravenous contrast. RADIATION DOSE REDUCTION: This exam was performed according to the departmental dose-optimization program which includes automated exposure control, adjustment of the mA and/or kV according to patient size and/or use of iterative reconstruction technique. COMPARISON:  03/22/2022 FINDINGS: Brain: Ventricles, cisterns and other CSF spaces are within normal. There is mild chronic ischemic microvascular disease. There is no mass, mass effect, shift of midline structures or acute hemorrhage. No evidence of acute infarction. Vascular: No hyperdense vessel or unexpected calcification. Skull: Normal. Negative for fracture or focal lesion. Sinuses/Orbits: No acute finding. Other: None. IMPRESSION: 1. No acute findings. 2. Mild chronic ischemic microvascular disease. Electronically Signed   By: Marin Olp M.D.   On: 03/23/2022 09:14   CT ANGIO HEAD NECK W WO CM (CODE STROKE)  Result Date: 03/22/2022 CLINICAL DATA:  Provided history: Left-sided facial droop. Weakness. Numbness. Last known well 1800. EXAM: CT ANGIOGRAPHY HEAD AND NECK TECHNIQUE: Multidetector CT imaging of the head and neck was performed using the standard protocol during bolus administration of intravenous contrast. Multiplanar CT image reconstructions and MIPs were obtained to evaluate the vascular anatomy. Carotid  stenosis measurements (when applicable) are obtained utilizing NASCET criteria, using the distal internal carotid diameter as the denominator. RADIATION DOSE REDUCTION: This exam was performed according to the departmental dose-optimization program which includes automated exposure control, adjustment of the mA and/or kV according to patient size and/or use of iterative reconstruction technique. CONTRAST:  65m OMNIPAQUE IOHEXOL 350 MG/ML SOLN COMPARISON:  Noncontrast head CT performed earlier today 03/22/2022. FINDINGS: CTA NECK FINDINGS Aortic arch: The visualized aortic arch is normal in caliber. Standard aortic branching. Atherosclerotic plaque scattered within the proximal major branch vessels of the neck. No hemodynamically significant innominate or proximal subclavian artery stenosis. Right carotid system: CCA and ICA patent within the neck without stenosis. Minimal atherosclerotic plaque about the carotid bifurcation. Left carotid system: CCA and ICA patent within the neck without stenosis. Mild atherosclerotic plaque at the CCA origin and about the carotid bifurcation. Vertebral arteries: Vertebral arteries patent within the neck. Moderate atherosclerotic narrowing at the origin of the non dominant right vertebral artery. Venous reflux from a right-sided contrast bolus obscures  portions of the V1 right vertebral artery. Within this limitation, there is no appreciable stenosis elsewhere within the cervical vertebral arteries. Skeleton: Cervical spondylosis. No acute fracture or aggressive osseous lesion. Other neck: Focus of asymmetric fat density within the right posterior neck, measuring 5.4 x 4.5 cm, compatible with a lipoma (for instance as seen on series 7, image 216) (series 910, image 91). No pathologically enlarged cervical chain lymph nodes. Upper chest: No consolidation within the imaged lung apices. Review of the MIP images confirms the above findings CTA HEAD FINDINGS Anterior circulation: The  intracranial internal carotid arteries are patent. Atherosclerotic plaque within both vessels with no more than mild stenosis. The M1 middle cerebral arteries are patent. Atherosclerotic irregularity of the MCA branches, bilaterally. No M2 proximal branch occlusion or high-grade proximal stenosis is identified. The anterior cerebral arteries are patent. No intracranial aneurysm is identified. Posterior circulation: The intracranial vertebral arteries are patent. The basilar artery is patent. The posterior cerebral arteries are patent. The posterior cerebral arteries are patent. A left posterior communicating artery is present. The right posterior communicating artery is diminutive or absent. Venous sinuses: Within the limitations of contrast timing, no convincing thrombus. Anatomic variants: As described Review of the MIP images confirms the above findings No emergent large vessel occlusion is identified. These results were communicated to Dr. Leonel Ramsay at 7:18 pmon 11/23/2023by text page via the [facility] messaging system. IMPRESSION: CTA neck: 1. The common carotid and internal carotid arteries are patent within the neck without stenosis. Mild atherosclerotic plaque, bilaterally. 2. The vertebral arteries are patent within the neck. Moderate atherosclerotic narrowing at the origin of the non-dominant right vertebral artery. Venous reflux from a right-sided contrast bolus partially obscures the right V1 segment. Within this limitation, there is no appreciable stenosis elsewhere within the cervical vertebral arteries. 3. 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 4. Cervical spondylosis. CTA head: Intracranial atherosclerotic disease, as described. No intracranial large vessel occlusion or proximal high-grade arterial stenosis identified. Electronically Signed   By: Kellie Simmering D.O.   On: 03/22/2022 19:19   CT HEAD CODE STROKE WO CONTRAST  Result Date: 03/22/2022 CLINICAL DATA:  Code  stroke. Neuro deficit, acute, stroke suspected. Left-sided facial droop. Weakness. Numbness. EXAM: CT HEAD WITHOUT CONTRAST TECHNIQUE: Contiguous axial images were obtained from the base of the skull through the vertex without intravenous contrast. RADIATION DOSE REDUCTION: This exam was performed according to the departmental dose-optimization program which includes automated exposure control, adjustment of the mA and/or kV according to patient size and/or use of iterative reconstruction technique. COMPARISON:  Report from head CT 12/15/2010 (images unavailable). FINDINGS: Brain: Mild generalized cerebral atrophy. Mild patchy and ill-defined hypoattenuation within the cerebral white matter, nonspecific but compatible with chronic small vessel disease. There is no acute intracranial hemorrhage. No demarcated cortical infarct. No extra-axial fluid collection. No evidence of an intracranial mass. No midline shift. Vascular: No hyperdense vessel. Atherosclerotic calcifications. Skull: No fracture or aggressive osseous lesion. Sinuses/Orbits: No mass or acute finding within the imaged orbits. Minimal mucosal thickening scattered within the bilateral ethmoid air cells. ASPECTS ([place] Stroke Program Early CT Score) - Ganglionic level infarction (caudate, lentiform nuclei, internal capsule, insula, M1-M3 cortex): 7 - Supraganglionic infarction (M4-M6 cortex): 3 Total score (0-10 with 10 being normal): 10 These results were communicated to Dr. Leonel Ramsay at 6:58 pmon 11/23/2023by text page via the [facility] messaging system. IMPRESSION: No evidence of acute intracranial abnormality. Mild chronic small vessel ischemic changes within the cerebral  white matter. Mild cerebral atrophy. Electronically Signed   By: Kellie Simmering D.O.   On: 03/22/2022 19:00     Discharge Exam: Vitals:   03/23/22 0830 03/23/22 1219  BP: (!) 180/90 (!) 193/80  Pulse: 62 75  Resp:    Temp: 98.2 F (36.8 C) 98.2 F (36.8 C)  SpO2: 96% 98%    Vitals:   03/23/22 0049 03/23/22 0359 03/23/22 0830 03/23/22 1219  BP: (!) 173/80 (!) 175/77 (!) 180/90 (!) 193/80  Pulse: 68 62 62 75  Resp: 16 14    Temp: 98 F (36.7 C) 98.2 F (36.8 C) 98.2 F (36.8 C) 98.2 F (36.8 C)  TempSrc: Oral Oral Oral Oral  SpO2: 96% 97% 96% 98%  Weight:      Height:        General: Pt is alert, awake, not in acute distress Cardiovascular: RRR, S1/S2 +, no rubs, no gallops Respiratory: CTA bilaterally, no wheezing, no rhonchi Abdominal: Soft, NT, ND, bowel sounds + Extremities: no edema, no cyanosis    The results of significant diagnostics from this hospitalization (including imaging, microbiology, ancillary and laboratory) are listed below for reference.     Microbiology: No results found for this or any previous visit (from the past 240 hour(s)).   Labs: BNP (last 3 results) No results for input(s): "BNP" in the last 8760 hours. Basic Metabolic Panel: Recent Labs  Lab 03/22/22 1839 03/22/22 1844 03/22/22 2145 03/23/22 0253  NA 140 140  --  139  K 3.9 3.9  --  3.7  CL 100 99  --  103  CO2 28  --   --  24  GLUCOSE 169* 172*  --  105*  BUN 12 14  --  11  CREATININE 1.48* 1.50* 1.43* 1.28*  CALCIUM 9.7  --   --  9.0   Liver Function Tests: Recent Labs  Lab 03/22/22 1839  AST 30  ALT 29  ALKPHOS 71  BILITOT 1.8*  PROT 7.6  ALBUMIN 4.2   No results for input(s): "LIPASE", "AMYLASE" in the last 168 hours. No results for input(s): "AMMONIA" in the last 168 hours. CBC: Recent Labs  Lab 03/22/22 1839 03/22/22 1844 03/22/22 2145 03/23/22 0253  WBC 9.2  --  9.1 9.1  NEUTROABS 5.4  --   --   --   HGB 15.5 15.3 13.5 13.7  HCT 45.4 45.0 39.2 39.3  MCV 88.2  --  87.3 86.0  PLT 159  --  141* 142*   Cardiac Enzymes: No results for input(s): "CKTOTAL", "CKMB", "CKMBINDEX", "TROPONINI" in the last 168 hours. BNP: Invalid input(s): "POCBNP" CBG: Recent Labs  Lab 03/22/22 1844 03/22/22 2145  GLUCAP 163* 184*    D-Dimer No results for input(s): "DDIMER" in the last 72 hours. Hgb A1c No results for input(s): "HGBA1C" in the last 72 hours. Lipid Profile Recent Labs    03/23/22 0253  CHOL 141  HDL 36*  LDLCALC 75  TRIG 152*  CHOLHDL 3.9   Thyroid function studies Recent Labs    03/22/22 2145  TSH 3.342   Anemia work up No results for input(s): "VITAMINB12", "FOLATE", "FERRITIN", "TIBC", "IRON", "RETICCTPCT" in the last 72 hours. Urinalysis    Component Value Date/Time   COLORURINE YELLOW 03/22/2022 2116   APPEARANCEUR CLEAR 03/22/2022 2116   LABSPEC 1.032 (H) 03/22/2022 2116   PHURINE 7.0 03/22/2022 2116   GLUCOSEU >=500 (A) 03/22/2022 2116   HGBUR NEGATIVE 03/22/2022 2116   [place] NEGATIVE 03/22/2022 2116  [place] NEGATIVE 03/22/2022 2116   PROTEINUR 100 (A) 03/22/2022 2116   NITRITE NEGATIVE 03/22/2022 2116   LEUKOCYTESUR NEGATIVE 03/22/2022 2116   Sepsis Labs Recent Labs  Lab 03/22/22 1839 03/22/22 2145 03/23/22 0253  WBC 9.2 9.1 9.1   Microbiology No results found for this or any previous visit (from the past 240 hour(s)).   Time coordinating discharge: Over 30 minutes  SIGNED:   Darliss Cheney, MD  [facility] 03/23/2022, 1:58 PM *Please note that this is a verbal dictation therefore any spelling or grammatical errors are due to the "[employer]" system interpretation. If 7PM-7AM, please contact night-coverage [email or web address]

## 2022-03-23 NOTE — Evaluation (Signed)
Physical Therapy Evaluation Patient Details Name: Tony [name] MRN: [id number] DOB: 1950/05/20 Today's Date: 03/23/2022  History of Present Illness  Pt is a 71 y.o. male presenting to ED with facial weakness, dysarthria, and improvig L sided numbness. MRI revealed no acute intracranial abnormality, with mild chronic small vessel ischemic changes within the cerebral white matter. PMH significant for HTN, TAVR, heart block s/p pacemaker, BPH, OSA, CHF, DM2, hypothyroidism s/p thyroidectomy.  Clinical Impression  Patient presents with decreased balance, decreased safety and more at risk for falls having had fall in past 10 days.  Reports using walking sticks (ski poles) at times and wears supportive shoes.  HE was able to make it up flight of steps using rail and cane with close S/CGA for safety.  He reports plans to stay with family initially.  Feel he will benefit from follow up outpatient PT at d/c.  Also recommend RW for home.  Noted for dc this pm so no continued skilled PT in this setting.      Recommendations for follow up therapy are one component of a multi-disciplinary discharge planning process, led by the attending physician.  Recommendations may be updated based on patient status, additional functional criteria and insurance authorization.  Follow Up Recommendations Outpatient PT      Assistance Recommended at Discharge Set up Supervision/Assistance  Patient can return home with the following  Assist for transportation;Help with stairs or ramp for entrance;A little help with walking and/or transfers    Equipment Recommendations Rolling walker (2 wheels)  Recommendations for Other Services       Functional Status Assessment Patient has had a recent decline in their functional status and demonstrates the ability to make significant improvements in function in a reasonable and predictable amount of time.     Precautions / Restrictions Precautions Precautions:  Fall Restrictions Weight Bearing Restrictions: No      Mobility  Bed Mobility         Supine to sit: Modified independent (Device/Increase time)     General bed mobility comments: using UE's to come upright and increased time from sidelying    Transfers Overall transfer level: Needs assistance Equipment used: Straight cane Transfers: Sit to/from Stand Sit to Stand: Supervision                Ambulation/Gait Ambulation/Gait assistance: Supervision Gait Distance (Feet): 150 Feet Assistive device: Straight cane Gait Pattern/deviations: Step-through pattern, Wide base of support, Decreased stride length       General Gait Details: short strides and utilizing cane, but veering some and mild imbalance though no overt LOB with S for safety  Stairs Stairs: Yes Stairs assistance: Supervision, Min guard Stair Management: One rail Left, Alternating pattern, Step to pattern, Forwards Number of Stairs: 10 General stair comments: cues for sequencing and pt prefers step over step as feels posterior bias with two feet on one step.  Wheelchair Mobility    Modified Rankin (Stroke Patients Only) Modified Rankin (Stroke Patients Only) Pre-Morbid Rankin Score: Slight disability Modified Rankin: Moderately severe disability     Balance Overall balance assessment: Needs assistance Sitting-balance support: Feet supported Sitting balance-Leahy Scale: Fair Sitting balance - Comments: able to sit with arms crossed over chest, but positioning behind his BOS and fatgiuing using his UE's for balance most of time sitting EOB Postural control: Posterior lean Standing balance support: Reliant on assistive device for balance, Single extremity supported Standing balance-Leahy Scale: Poor Standing balance comment: using cane, more balanced with mobility than static,  reported feels posterior bias when placing both feet on step                             Pertinent  Vitals/Pain Pain Assessment Faces Pain Scale: No hurt    Home Living Family/patient expects to be discharged to:: Private residence Living Arrangements: Alone Available Help at Discharge: Family;Available PRN/intermittently Type of Home: Apartment Home Access: Stairs to enter Entrance Stairs-Rails: Right;Left;Can reach both Entrance Stairs-Number of Steps: 15   Home Layout: One level Home Equipment: Other (comment);Grab bars - tub/shower Additional Comments: walking stick (ski poles) and grabbars on steps    Prior Function Prior Level of Function : Independent/Modified Independent;History of Falls (last six months);Driving             Mobility Comments: no AD but has recently been unstable and has been using a walking stick. ADLs Comments: no assist for ADL, IADL     Hand Dominance   Dominant Hand: Right    Extremity/Trunk Assessment   Upper Extremity Assessment Upper Extremity Assessment: Defer to OT evaluation RUE Deficits / Details: decreased FM coordination as compared to L RUE Sensation: WNL RUE Coordination: decreased fine motor    Lower Extremity Assessment Lower Extremity Assessment: LLE deficits/detail;RLE deficits/detail RLE Deficits / Details: AROM and strength WFL RLE Sensation: history of peripheral neuropathy LLE Deficits / Details: AROM and strength WFL LLE Sensation: history of peripheral neuropathy    Cervical / Trunk Assessment Cervical / Trunk Assessment: Other exceptions Cervical / Trunk Exceptions: reports core weakness since before TAVR; though did do cardiac rehab  Communication   Communication: No difficulties  Cognition Arousal/Alertness: Awake/alert Behavior During Therapy: Anxious, WFL for tasks assessed/performed Overall Cognitive Status: Within Functional Limits for tasks assessed                                 General Comments: a bit hyperverbal and compensating for deficits with some anxiety though he denies         General Comments General comments (skin integrity, edema, etc.): VSS, RN reports for d/c soon    Exercises     Assessment/Plan    PT Assessment All further PT needs can be met in the next venue of care  PT Problem List Decreased balance;Decreased knowledge of use of DME;Decreased activity tolerance       PT Treatment Interventions      PT Goals (Current goals can be found in the Care Plan section)  Acute Rehab PT Goals PT Goal Formulation: All assessment and education complete, DC therapy    Frequency       Co-evaluation               AM-PAC PT "6 Clicks" Mobility  Outcome Measure Help needed turning from your back to your side while in a flat bed without using bedrails?: None Help needed moving from lying on your back to sitting on the side of a flat bed without using bedrails?: None Help needed moving to and from a bed to a chair (including a wheelchair)?: None Help needed standing up from a chair using your arms (e.g., wheelchair or bedside chair)?: None Help needed to walk in hospital room?: None Help needed climbing 3-5 steps with a railing? : A Little 6 Click Score: 23    End of Session Equipment Utilized During Treatment: Gait belt Activity Tolerance: Patient tolerated treatment  well Patient left: in bed;with call bell/phone within reach   PT Visit Diagnosis: Other abnormalities of gait and mobility (R26.89);Other symptoms and signs involving the nervous system (R29.898)    Time: 1340-1406 PT Time Calculation (min) (ACUTE ONLY): 26 min   Charges:   PT Evaluation $PT Eval Low Complexity: 1 Low PT Treatments $Gait Training: 8-22 mins        Magda Kiel, PT Acute Rehabilitation Services Office:[phone] 03/23/2022   Reginia Naas 03/23/2022, 2:24 PM

## 2022-03-23 NOTE — Evaluation (Signed)
Occupational Therapy Evaluation Patient Details Name: Tony [name] MRN: [id number] DOB: 06-Jun-1950 Today's Date: 03/23/2022   History of Present Illness Pt is a 71 y.o. male presenting to ED with facial weakness, dysarthria, and improvig L sided numbness. MRI revealed no acute intracranial abnormality, with mild chronic small vessel ischemic changes within the cerebral white matter. PMH significant for HTN, TAVR, heart block s/p pacemaker, BPH, OSA, CHF, DM2, hypothyroidism s/p thyroidectomy.   Clinical Impression   PTA, pt was independent and lived alone. Upon eval, pt performing UB Adl with set-up and LB ADL with up to min guard A. Pt performing tub/shower transfer with min guard A. Very eager to participate and learn about safety and AD/DME. Pt with decreased coordination, strength, and balance as compared to his baseline. Recommending OP OT to optimize safety and independence in ADL and IADL.      Recommendations for follow up therapy are one component of a multi-disciplinary discharge planning process, led by the attending physician.  Recommendations may be updated based on patient status, additional functional criteria and insurance authorization.   Follow Up Recommendations  Outpatient OT     Assistance Recommended at Discharge Intermittent Supervision/Assistance  Patient can return home with the following A little help with walking and/or transfers;A little help with bathing/dressing/bathroom;Assistance with cooking/housework;Help with stairs or ramp for entrance    Functional Status Assessment  Patient has had a recent decline in their functional status and demonstrates the ability to make significant improvements in function in a reasonable and predictable amount of time.  Equipment Recommendations  BSC/3in1    Recommendations for Other Services       Precautions / Restrictions Precautions Precautions: Fall Restrictions Weight Bearing Restrictions: No      Mobility  Bed Mobility Overal bed mobility: Needs Assistance Bed Mobility: Supine to Sit, Sit to Supine     Supine to sit: Supervision Sit to supine: Supervision   General bed mobility comments: supervision for safety    Transfers Overall transfer level: Needs assistance Equipment used: None Transfers: Sit to/from Stand Sit to Stand: Supervision, Min guard           General transfer comment: min guard A-suppervision for safety      Balance Overall balance assessment: Mild deficits observed, not formally tested                                         ADL either performed or assessed with clinical judgement   ADL Overall ADL's : Needs assistance/impaired Eating/Feeding: Modified independent;Sitting   Grooming: Oral care;Supervision/safety;Standing   Upper Body Bathing: Set up;Sitting   Lower Body Bathing: Supervison/ safety;Sit to/from stand   Upper Body Dressing : Sitting;Set up   Lower Body Dressing: Supervision/safety;Sit to/from stand;Set up   Toilet Transfer: Min guard;Rolling walker (2 wheels);Ambulation;Comfort height toilet (RW for one half of session; cane for second half of session)       Tub/ Shower Transfer: Min guard;Ambulation;BSC/3in1;Rolling walker (2 wheels) Tub/Shower Transfer Details (indicate cue type and reason): min guard A for safety Functional mobility during ADLs: Min guard;Cane General ADL Comments: min guard A for safety. Appears stiff when initiating gait     Vision Baseline Vision/History: 1 Wears glasses Ability to See in Adequate Light: 0 Adequate Patient Visual Report: No change from baseline Vision Assessment?: No apparent visual deficits     Perception Perception Perception Tested?: No  Praxis Praxis Praxis tested?: Not tested    Pertinent Vitals/Pain Pain Assessment Pain Assessment: Faces Faces Pain Scale: No hurt Pain Intervention(s): Monitored during session     Hand Dominance Right    Extremity/Trunk Assessment Upper Extremity Assessment Upper Extremity Assessment: Generalized weakness;RUE deficits/detail RUE Deficits / Details: decreased FM coordination as compared to L RUE Sensation: WNL RUE Coordination: decreased fine motor   Lower Extremity Assessment Lower Extremity Assessment: Defer to PT evaluation   Cervical / Trunk Assessment Cervical / Trunk Assessment: Normal   Communication Communication Communication: No difficulties   Cognition Arousal/Alertness: Awake/alert Behavior During Therapy: WFL for tasks assessed/performed Overall Cognitive Status: Within Functional Limits for tasks assessed                                 General Comments: pleasant and conversational throughout. Occasionally increased time for processing..     General Comments       Exercises     Shoulder Instructions      Home Living Family/patient expects to be discharged to:: Private residence Living Arrangements: Alone Available Help at Discharge: Family;Available PRN/intermittently Type of Home: Apartment Home Access: Stairs to enter Entrance Stairs-Number of Steps: 15 Entrance Stairs-Rails: Right;Left;Can reach both Home Layout: One level     Bathroom Shower/Tub: Teacher, early years/pre: Standard     Home Equipment: Other (comment);Grab bars - tub/shower (walking stick; portable grab bars.)          Prior Functioning/Environment Prior Level of Function : Independent/Modified Independent;History of Falls (last six months);Driving             Mobility Comments: no AD but has recently been unstable and has been using a walking stick. ADLs Comments: no assist for ADL, IADL, or        OT Problem List: Decreased strength;Decreased activity tolerance;Impaired balance (sitting and/or standing);Decreased coordination;Decreased safety awareness;Decreased knowledge of use of DME or AE;Impaired UE functional use      OT  Treatment/Interventions: Self-care/ADL training;Therapeutic exercise;DME and/or AE instruction;Therapeutic activities;Patient/family education;Balance training    OT Goals(Current goals can be found in the care plan section) Acute Rehab OT Goals OT Goal Formulation: With patient Time For Goal Achievement: 04/06/22 Potential to Achieve Goals: Good  OT Frequency: Min 2X/week    Co-evaluation              AM-PAC OT "6 Clicks" Daily Activity     Outcome Measure Help from another person eating meals?: None Help from another person taking care of personal grooming?: A Little Help from another person toileting, which includes using toliet, bedpan, or urinal?: A Little Help from another person bathing (including washing, rinsing, drying)?: A Little Help from another person to put on and taking off regular upper body clothing?: A Little Help from another person to put on and taking off regular lower body clothing?: A Little 6 Click Score: 19   End of Session Equipment Utilized During Treatment: Gait belt;Rolling walker (2 wheels);Other (comment) (cane) Nurse Communication: Mobility status  Activity Tolerance: Patient tolerated treatment well Patient left: in bed;with call bell/phone within reach;with bed alarm set;with family/visitor present  OT Visit Diagnosis: Unsteadiness on feet (R26.81);Muscle weakness (generalized) (M62.81);Other abnormalities of gait and mobility (R26.89);History of falling (Z91.81)                Time: 0932-1000 OT Time Calculation (min): 28 min Charges:  OT General Charges $OT Visit: 1 Visit  OT Evaluation $OT Eval Moderate Complexity: 1 Mod OT Treatments $Self Care/Home Management : 8-22 mins  Elder Cyphers, OTR/L [facility] Acute Rehabilitation Office: [phone]   Magnus Ivan 03/23/2022, 1:43 PM

## 2022-03-23 NOTE — Care Management Obs Status (Signed)
[place] NOTIFICATION   Patient Details  Name: ISAC LINCKS MRN: [id number] Date of Birth: [birth year]   Medicare Observation Status Notification Given:  Yes    Pollie Friar, RN 03/23/2022, 2:51 PM

## 2022-03-23 NOTE — Discharge Instructions (Signed)
Recommendations for Outpatient Follow-up:  Follow up with PCP in 1-2 weeks Please obtain BMP/CBC in one week Follow-up with neurology in 4 weeks Please follow up with your PCP on the following pending results:

## 2022-03-23 NOTE — Progress Notes (Signed)
  Echocardiogram 2D Echocardiogram has been performed.  Tony [name] 03/23/2022, 12:26 PM

## 2022-03-23 NOTE — Progress Notes (Addendum)
STROKE TEAM PROGRESS NOTE   INTERVAL HISTORY Patient is seen in his room with several family members at the bedside.  Yesterday, he presented with facial weakness, dysarthria and left-sided numbness, which improved after presentation.  Due to the mildness and resolving nature of his symptoms, TNK was not administered.  Vitals:   03/22/22 2305 03/23/22 0049 03/23/22 0359 03/23/22 0830  BP: (!) 176/96 (!) 173/80 (!) 175/77 (!) 180/90  Pulse: 71 68 62 62  Resp:  16 14   Temp:  98 F (36.7 C) 98.2 F (36.8 C) 98.2 F (36.8 C)  TempSrc:  Oral Oral Oral  SpO2: 97% 96% 97% 96%  Weight:      Height:       CBC:  Recent Labs  Lab 03/22/22 1839 03/22/22 1844 03/22/22 2145 03/23/22 0253  WBC 9.2  --  9.1 9.1  NEUTROABS 5.4  --   --   --   HGB 15.5   < > 13.5 13.7  HCT 45.4   < > 39.2 39.3  MCV 88.2  --  87.3 86.0  PLT 159  --  141* 142*   < > = values in this interval not displayed.   Basic Metabolic Panel:  Recent Labs  Lab 03/22/22 1839 03/22/22 1844 03/22/22 2145 03/23/22 0253  NA 140 140  --  139  K 3.9 3.9  --  3.7  CL 100 99  --  103  CO2 28  --   --  24  GLUCOSE 169* 172*  --  105*  BUN 12 14  --  11  CREATININE 1.48* 1.50* 1.43* 1.28*  CALCIUM 9.7  --   --  9.0   Lipid Panel:  Recent Labs  Lab 03/23/22 0253  CHOL 141  TRIG 152*  HDL 36*  CHOLHDL 3.9  VLDL 30  LDLCALC 75   HgbA1c: No results for input(s): "HGBA1C" in the last 168 hours. Urine Drug Screen:  Recent Labs  Lab 03/22/22 2116  LABOPIA NONE DETECTED  COCAINSCRNUR NONE DETECTED  LABBENZ NONE DETECTED  AMPHETMU NONE DETECTED  THCU NONE DETECTED  LABBARB NONE DETECTED    Alcohol Level  Recent Labs  Lab 03/22/22 1839  ETH <10    IMAGING past 24 hours CT HEAD WO CONTRAST (5MM)  Result Date: 03/23/2022 CLINICAL DATA:  Follow-up stroke. EXAM: CT HEAD WITHOUT CONTRAST TECHNIQUE: Contiguous axial images were obtained from the base of the skull through the vertex without intravenous  contrast. RADIATION DOSE REDUCTION: This exam was performed according to the departmental dose-optimization program which includes automated exposure control, adjustment of the mA and/or kV according to patient size and/or use of iterative reconstruction technique. COMPARISON:  03/22/2022 FINDINGS: Brain: Ventricles, cisterns and other CSF spaces are within normal. There is mild chronic ischemic microvascular disease. There is no mass, mass effect, shift of midline structures or acute hemorrhage. No evidence of acute infarction. Vascular: No hyperdense vessel or unexpected calcification. Skull: Normal. Negative for fracture or focal lesion. Sinuses/Orbits: No acute finding. Other: None. IMPRESSION: 1. No acute findings. 2. Mild chronic ischemic microvascular disease. Electronically Signed   By: Marin Olp M.D.   On: 03/23/2022 09:14   CT ANGIO HEAD NECK W WO CM (CODE STROKE)  Result Date: 03/22/2022 CLINICAL DATA:  Provided history: Left-sided facial droop. Weakness. Numbness. Last known well 1800. EXAM: CT ANGIOGRAPHY HEAD AND NECK TECHNIQUE: Multidetector CT imaging of the head and neck was performed using the standard protocol during bolus administration of intravenous contrast.  Multiplanar CT image reconstructions and MIPs were obtained to evaluate the vascular anatomy. Carotid stenosis measurements (when applicable) are obtained utilizing NASCET criteria, using the distal internal carotid diameter as the denominator. RADIATION DOSE REDUCTION: This exam was performed according to the departmental dose-optimization program which includes automated exposure control, adjustment of the mA and/or kV according to patient size and/or use of iterative reconstruction technique. CONTRAST:  33m OMNIPAQUE IOHEXOL 350 MG/ML SOLN COMPARISON:  Noncontrast head CT performed earlier today 03/22/2022. FINDINGS: CTA NECK FINDINGS Aortic arch: The visualized aortic arch is normal in caliber. Standard aortic branching.  Atherosclerotic plaque scattered within the proximal major branch vessels of the neck. No hemodynamically significant innominate or proximal subclavian artery stenosis. Right carotid system: CCA and ICA patent within the neck without stenosis. Minimal atherosclerotic plaque about the carotid bifurcation. Left carotid system: CCA and ICA patent within the neck without stenosis. Mild atherosclerotic plaque at the CCA origin and about the carotid bifurcation. Vertebral arteries: Vertebral arteries patent within the neck. Moderate atherosclerotic narrowing at the origin of the non dominant right vertebral artery. Venous reflux from a right-sided contrast bolus obscures portions of the V1 right vertebral artery. Within this limitation, there is no appreciable stenosis elsewhere within the cervical vertebral arteries. Skeleton: Cervical spondylosis. No acute fracture or aggressive osseous lesion. Other neck: Focus of asymmetric fat density within the right posterior neck, measuring 5.4 x 4.5 cm, compatible with a lipoma (for instance as seen on series 7, image 216) (series 910, image 91). No pathologically enlarged cervical chain lymph nodes. Upper chest: No consolidation within the imaged lung apices. Review of the MIP images confirms the above findings CTA HEAD FINDINGS Anterior circulation: The intracranial internal carotid arteries are patent. Atherosclerotic plaque within both vessels with no more than mild stenosis. The M1 middle cerebral arteries are patent. Atherosclerotic irregularity of the MCA branches, bilaterally. No M2 proximal branch occlusion or high-grade proximal stenosis is identified. The anterior cerebral arteries are patent. No intracranial aneurysm is identified. Posterior circulation: The intracranial vertebral arteries are patent. The basilar artery is patent. The posterior cerebral arteries are patent. The posterior cerebral arteries are patent. A left posterior communicating artery is present.  The right posterior communicating artery is diminutive or absent. Venous sinuses: Within the limitations of contrast timing, no convincing thrombus. Anatomic variants: As described Review of the MIP images confirms the above findings No emergent large vessel occlusion is identified. These results were communicated to Dr. KLeonel Ramsayat 7:18 pmon 11/23/2023by text page via the [facility]. IMPRESSION: CTA neck: 1. The common carotid and internal carotid arteries are patent within the neck without stenosis. Mild atherosclerotic plaque, bilaterally. 2. The vertebral arteries are patent within the neck. Moderate atherosclerotic narrowing at the origin of the non-dominant right vertebral artery. Venous reflux from a right-sided contrast bolus partially obscures the right V1 segment. Within this limitation, there is no appreciable stenosis elsewhere within the cervical vertebral arteries. 3. 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 4. Cervical spondylosis. CTA head: Intracranial atherosclerotic disease, as described. No intracranial large vessel occlusion or proximal high-grade arterial stenosis identified. Electronically Signed   By: KKellie SimmeringD.O.   On: 03/22/2022 19:19   CT HEAD CODE STROKE WO CONTRAST  Result Date: 03/22/2022 CLINICAL DATA:  Code stroke. Neuro deficit, acute, stroke suspected. Left-sided facial droop. Weakness. Numbness. EXAM: CT HEAD WITHOUT CONTRAST TECHNIQUE: Contiguous axial images were obtained from the base of the skull through the vertex without  intravenous contrast. RADIATION DOSE REDUCTION: This exam was performed according to the departmental dose-optimization program which includes automated exposure control, adjustment of the mA and/or kV according to patient size and/or use of iterative reconstruction technique. COMPARISON:  Report from head CT 12/15/2010 (images unavailable). FINDINGS: Brain: Mild generalized cerebral atrophy. Mild patchy  and ill-defined hypoattenuation within the cerebral white matter, nonspecific but compatible with chronic small vessel disease. There is no acute intracranial hemorrhage. No demarcated cortical infarct. No extra-axial fluid collection. No evidence of an intracranial mass. No midline shift. Vascular: No hyperdense vessel. Atherosclerotic calcifications. Skull: No fracture or aggressive osseous lesion. Sinuses/Orbits: No mass or acute finding within the imaged orbits. Minimal mucosal thickening scattered within the bilateral ethmoid air cells. ASPECTS ([place] Stroke Program Early CT Score) - Ganglionic level infarction (caudate, lentiform nuclei, internal capsule, insula, M1-M3 cortex): 7 - Supraganglionic infarction (M4-M6 cortex): 3 Total score (0-10 with 10 being normal): 10 These results were communicated to Dr. Leonel Ramsay at 6:58 pmon 11/23/2023by text page via the [facility] messaging system. IMPRESSION: No evidence of acute intracranial abnormality. Mild chronic small vessel ischemic changes within the cerebral white matter. Mild cerebral atrophy. Electronically Signed   By: Kellie Simmering D.O.   On: 03/22/2022 19:00    PHYSICAL EXAM General: Alert, well-nourished, well-developed elderly patient in no acute distress Respiratory: Regular, unlabored respirations on room air   NEURO:  Mental Status: AA&Ox3  Speech/Language: speech is without dysarthria or aphasia.  Naming,  fluency, and comprehension intact.  Cranial Nerves:  II: PERRL. Visual fields full.  Blind in right eye, this is baseline III, IV, VI: EOMI. Eyelids elevate symmetrically.  V: Sensation is intact to light touch and symmetrical to face.  VII: Smile is symmetrical.  VIII: hearing intact to voice. IX, X: Phonation is normal.  XII: tongue is midline without fasciculations. Motor: 5/5 strength to all muscle groups tested.  Tone: is normal and bulk is normal Sensation- Intact to light touch bilaterally. Gait-  deferred   ASSESSMENT/PLAN Mr. ESTIBEN MIZUNO is a 71 y.o. 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  Due to the mildness and resolving nature of his symptoms, TNK was not administered.  His pacemaker is not MRI compatible, so repeat head CT was performed not showing stroke.  This is unsurprising given mild nature of symptoms.  Stroke: Likely small right-sided stroke Etiology: Likely small vessel disease Code Stroke CT head No acute abnormality. Small vessel disease. ASPECTS 10.    CTA head & neck no LVO or hemodynamically significant stenosis Follow-up CT no acute abnormality MRI unable to perform due to pacemaker 2D Echo EF 40-45% LDL 75 HgbA1c 7.5 VTE prophylaxis -Lovenox    Diet   Diet heart healthy/carb modified Room service appropriate? Yes; Fluid consistency: Thin   aspirin 81 mg daily prior to admission, now on aspirin 81 mg daily and clopidogrel 75 mg daily.  For 3 weeks followed by Plavix alone Therapy recommendations: Pending Disposition: Pending, likely home  Hypertension Home meds: Carvedilol 3.125 mg twice a day Stable Permissive hypertension (OK if < 220/120) but gradually normalize in 5-7 days Would likely benefit from second antihypertensive or increasing Coreg once out of permissive hypertension window Long-term BP goal normotensive  Hyperlipidemia Home meds: None LDL 75, goal < 70 Add ezetimibe 10 mg daily High intensity statin not indicated due to previous intolerance Continue statin at discharge  Diabetes type  II Uncontrolled Home meds: Invokana 300 mg daily, dulaglutide 4.5 mg weekly, insulin aspart 5 units daily, insulin glargine 20 units twice daily HgbA1c 7.5, goal < 7.0 CBGs Recent Labs    03/22/22 1844 03/22/22 2145  GLUCAP 163* 184*    SSI  Other Stroke Risk Factors Advanced Age >/= 43  Obesity,  Body mass index is 33.1 kg/m., BMI >/= 30 associated with increased stroke risk, recommend weight loss, diet and exercise as appropriate  Congestive heart failure  Other Active Problems None  Hospital day # [place] , MSN, AGACNP-BC [facility] See Amion for schedule and pager information 03/23/2022 12:28 PM  ATTENDING ATTESTATION:  71 year old with small stroke symptoms.  Unable to get MRI due incompatible ICD.  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  Continue DAPT therapy as outlined above.  He can follow-up in stroke clinic outpatient with Dr. Leonie Man  Dr. Reeves Forth evaluated pt independently, reviewed imaging, chart, labs. Discussed and formulated plan with the Resident/APP. Changes were made to the note where appropriate. Please see APP/resident note above for details.     Zayvien Canning,MD     To contact Stroke Continuity provider, please refer to [email or web address] After hours, contact General Neurology

## 2022-03-24 LAB — HEMOGLOBIN A1C
Hgb A1c MFr Bld: 8.6 % — ABNORMAL HIGH (ref 4.8–5.6)
Mean Plasma Glucose: 200 mg/dL

## 2022-03-27 DIAGNOSIS — E1121 Type 2 diabetes mellitus with diabetic nephropathy: Secondary | ICD-10-CM | POA: Diagnosis not present

## 2022-03-27 DIAGNOSIS — I10 Essential (primary) hypertension: Secondary | ICD-10-CM | POA: Diagnosis not present

## 2022-03-27 DIAGNOSIS — I639 Cerebral infarction, unspecified: Secondary | ICD-10-CM | POA: Diagnosis not present

## 2022-03-29 ENCOUNTER — Encounter: Payer: Self-pay | Admitting: Occupational Therapy

## 2022-03-29 ENCOUNTER — Ambulatory Visit: Payer: Medicare PPO | Admitting: Physical Therapy

## 2022-03-29 ENCOUNTER — Encounter: Payer: Self-pay | Admitting: Physical Therapy

## 2022-03-29 ENCOUNTER — Ambulatory Visit: Payer: Medicare PPO | Admitting: Occupational Therapy

## 2022-03-29 ENCOUNTER — Ambulatory Visit: Payer: Medicare PPO | Attending: Internal Medicine | Admitting: Occupational Therapy

## 2022-03-29 ENCOUNTER — Other Ambulatory Visit: Payer: Self-pay

## 2022-03-29 VITALS — BP 142/75 | HR 63

## 2022-03-29 DIAGNOSIS — I69354 Hemiplegia and hemiparesis following cerebral infarction affecting left non-dominant side: Secondary | ICD-10-CM | POA: Insufficient documentation

## 2022-03-29 DIAGNOSIS — R41842 Visuospatial deficit: Secondary | ICD-10-CM | POA: Insufficient documentation

## 2022-03-29 DIAGNOSIS — R2681 Unsteadiness on feet: Secondary | ICD-10-CM | POA: Insufficient documentation

## 2022-03-29 DIAGNOSIS — R2689 Other abnormalities of gait and mobility: Secondary | ICD-10-CM | POA: Diagnosis not present

## 2022-03-29 DIAGNOSIS — M6281 Muscle weakness (generalized): Secondary | ICD-10-CM | POA: Diagnosis not present

## 2022-03-29 DIAGNOSIS — R278 Other lack of coordination: Secondary | ICD-10-CM | POA: Diagnosis not present

## 2022-03-29 NOTE — Therapy (Signed)
OUTPATIENT PHYSICAL THERAPY NEURO EVALUATION   Patient Name: Tony [name] MRN: [id number] DOB:1950/11/25, 71 y.o., male Today's Date: 03/29/2022   PCP: Kathalene Frames, MD REFERRING PROVIDER: Darliss Cheney, MD  END OF SESSION:  PT End of Session - 03/29/22 1330     Visit Number 1    Number of Visits 9   8+eval   Date for PT Re-Evaluation 06/01/22   pushed out due to potential delay in PT availability   Authorization Type [employer] MEDICARE    PT Start Time 1318    PT Stop Time 1403    PT Time Calculation (min) 45 min    Equipment Utilized During Treatment Gait belt    Activity Tolerance Patient tolerated treatment well    Behavior During Therapy [facility] for tasks assessed/performed;Restless;Anxious             Past Medical History:  Diagnosis Date   Cancer ([place])    thyroid   CHF (congestive heart failure) (HCC)    Complete heart block (HCC)    a. 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 Burt Knack and Dr. Cyndia Bent   Severe aortic stenosis    Sleep apnea    Past Surgical History:  Procedure Laterality Date   CARDIAC CATHETERIZATION     INTRAOPERATIVE TRANSTHORACIC ECHOCARDIOGRAM N/A 08/29/2021   Procedure: INTRAOPERATIVE TRANSTHORACIC ECHOCARDIOGRAM;  Surgeon: [name], MD;  Location: [place] [facility];  Service: Open Heart Surgery;  Laterality: N/A;   LUMBAR LAMINECTOMY/DECOMPRESSION MICRODISCECTOMY  07/19/2011   Procedure: LUMBAR LAMINECTOMY/DECOMPRESSION MICRODISCECTOMY 3 LEVELS;  Surgeon: Melina Schools, MD;  Location: [place];  Service: Orthopedics;  Laterality: Left;  Lumbar  three-Lumbar five LEFT DECOMPRESSION AND FORAMINOTOMY Lumbar three-four LEFT DISCECTOMY   PERMANENT PACEMAKER INSERTION N/A 08/11/2014   MDT Adapta L implanted by Dr [name] for CHB   RIGHT HEART CATH AND CORONARY ANGIOGRAPHY N/A 08/07/2021   Procedure: RIGHT HEART CATH AND CORONARY ANGIOGRAPHY;  Surgeon: [name], MD;  Location: [place] [facility];  Service: Cardiovascular;  Laterality: N/A;   RIGHT/LEFT HEART CATH AND CORONARY ANGIOGRAPHY N/A 08/07/2021   Procedure: RIGHT/LEFT HEART CATH AND CORONARY ANGIOGRAPHY;  Surgeon: [name], MD;  Location: [place] [facility];  Service: Cardiovascular;  Laterality: N/A;   Thyroidectomy x2     TRANSCATHETER AORTIC VALVE REPLACEMENT, TRANSFEMORAL N/A 08/29/2021   Procedure: Transcatheter Aortic Valve Replacement, Transfemoral;  Surgeon: [name], MD;  Location: [place] [facility];  Service: Open Heart Surgery;  Laterality: N/A;   Patient Active Problem List   Diagnosis Date Noted   Acute stroke due to ischemia ([place]) 03/22/2022   Aortic valve disorder 09/07/2021   Diabetic renal disease ([place]) 09/07/2021   Fatty liver 09/07/2021   Gastroesophageal reflux disease 09/07/2021   Heart block 09/07/2021   Hypercholesterolemia 09/07/2021   Mixed hyperlipidemia 09/07/2021   Hyperglycemia due to type 2 diabetes mellitus ([place]) 09/07/2021   Long term (current) use of insulin ([place]) 09/07/2021   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 associated with type 2 diabetes mellitus (  [place]) 09/07/2021   Mitral regurgitation 09/07/2021   Moderate recurrent major depression ([place]) 09/07/2021   Obesity 09/07/2021   Severe recurrent major depression without psychotic features ([place]) 09/07/2021   S/P TAVR (transcatheter aortic valve replacement) 92/33/0076   Chronic systolic CHF (congestive heart failure) ([place]) 11/04/2019   Pacemaker 11/04/2019   Ganglion cyst 12/19/2015   Severe aortic stenosis 09/01/2014   Essential  hypertension 09/01/2014   Type 2 diabetes mellitus with peripheral neuropathy ([place]) 09/01/2014   Hypothyroidism 09/01/2014   OSA on CPAP 09/01/2014   Lumbar stenosis 07/16/2011   HNP (herniated nucleus pulposus), lumbar 07/16/2011    ONSET DATE: 03/23/2022 (referral)  REFERRING DIAG: I63.9 (ICD-10-CM) - Cerebral infarction, unspecified mechanism ([place])  THERAPY DIAG:  Hemiplegia and hemiparesis following cerebral infarction affecting left non-dominant side (HCC)  Unsteadiness on feet  Muscle weakness (generalized)  Rationale for Evaluation and Treatment: Rehabilitation  SUBJECTIVE:                                                                                                                                                                                             SUBJECTIVE STATEMENT: Pt states he feels his hand writing has worsened and he has some imbalance. Pt accompanied by: self  PERTINENT HISTORY: Lumbar stenosis, aortic stenosis, HTN, DM II, pacemaker, chronic systolic CHF, GERD, hyperlipidemia, major depression  Pt presented to the ED 03/22/2022 w/ new onset of tingling in the left side of his face, heaviness of the left hand and had a facial droop per family.  "He has had chronic issues with lightheadedness, low blood pressures and disequilibrium since having his TAVR procedure.  He has been in rehab and has started getting strength back. He has chronic right sided blindness, due to previous CVA per report from family.  This was about 10 years ago."  PAIN:  Are you having pain? 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 Yes. Number of falls 3  LIVING ENVIRONMENT: Lives with: lives alone Lives in: House/apartment Stairs: No-he has a 2 level home w/ level entry to living (2nd) floor Has following equipment at home:  Gilford Rile - 2 wheeled, Grab bars, and walking stick  PLOF: Independent and has someone clean home  PATIENT GOALS: "I just got to get more stable walking, more endurance."  OBJECTIVE:  RUE BP (glucose sensor on LUE) in sitting prior to session: Today's Vitals   03/29/22 1325  BP: (!) 142/75  Pulse: 63   There is  no height or weight on file to calculate BMI. Pt reports most recent blood sugar in the 130s.  DIAGNOSTIC FINDINGS:  IMPRESSION: 1. No acute findings. 2. Mild chronic ischemic microvascular disease.  "repeat head CT was performed not showing stroke however neurology believes that he likely has small right-sided stroke which is yet not seen on CT head."  COGNITION: Overall cognitive status: No family/caregiver present to determine baseline cognitive functioning   SENSATION: WFL  COORDINATION: LE RAMS:  WFL bilaterally Heel-to-shin:  WFL bilaterally  EDEMA:  None noted bilaterally  MUSCLE TONE: none noted in bilateral LE  POSTURE: forward head  LOWER EXTREMITY ROM:    MMT Right Eval Left Eval  Hip flexion [facility]  Hip extension    Hip abduction " "  Hip adduction " "  Hip internal rotation    Hip external rotation    Knee flexion " "  Knee extension " "  Ankle dorsiflexion " "  Ankle plantarflexion    Ankle inversion    Ankle eversion      (Blank rows = not tested)  LOWER EXTREMITY MMT:    Active  Right Eval Left Eval  Hip flexion 4/5 4/5  Hip extension    Hip abduction 4/5 4/5  Hip adduction 4-/5 4-/5  Hip internal rotation    Hip external rotation    Knee flexion 4+/5 4+/5  Knee extension 4+/5 4+/5  Ankle dorsiflexion 4+/5 4+/5  Ankle plantarflexion    Ankle inversion    Ankle eversion     (Blank rows = not tested)  BED MOBILITY:  Sit to supine Complete Independence Supine to sit Complete Independence Rolling to Right Complete Independence Rolling to Left Complete Independence  TRANSFERS: Assistive device utilized: None  Sit  to stand: Complete Independence Stand to sit: Complete Independence Chair to chair: Complete Independence  GAIT: Gait pattern: step through pattern, decreased stride length, decreased hip/knee flexion- Right, decreased hip/knee flexion- Left, and trunk flexed Distance walked: 120' Assistive device utilized: None Level of assistance: Complete Independence Comments: Pt has increased pressure of foot fall, but no apparent foot slap, but will reassess in coming visits.  FUNCTIONAL TESTS:  5 times sit to stand: 14.84 sec no UE support Timed up and go (TUG): 11.59 sec no AD 6 minute walk test: TBD 10 meter walk test: 9.66 sec no AD = 1.04 m/sec OR 3.42 ft/sec Functional gait assessment: TBD  PATIENT SURVEYS:  ABC scale TBD  TODAY'S TREATMENT:                                                                                                                              DATE: N/A   PATIENT EDUCATION: Education details: PT POC, assessments used and to be used, and goals to be set. Person educated: Patient Education method: Explanation Education comprehension: verbalized understanding  HOME EXERCISE PROGRAM: To be initiated/reviewed from episode prior. Prior code:  [id number]  GOALS: Goals reviewed with  patient? Yes  SHORT TERM GOALS: Target date: 04/27/2022  Pt will be independent with strength and balance HEP to maintain gains at home. Baseline:  To be reviewed and updated. Goal status: INITIAL  2.  ABC scale to be completed w/ STG/LTG set as appropriate. Baseline:  To be assessed. Goal status: INITIAL  3.  Pt will decrease 5xSTS to </=13 seconds in order to demonstrate decreased risk for falls and improved functional bilateral LE strength and power. Baseline: 14.84 sec no UE support Goal status: INITIAL  4.  6MWT to be assessed w/ STG/LTG set as needed. Baseline:  To be assessed. Goal status: INITIAL  5.  FGA to be assessed with STG/LTG set as appropriate. Baseline: To be  assessed. Goal status: INITIAL  LONG TERM GOALS: Target date: 05/25/2022  Pt will be compliant to walking program no less than 3 days per week to promote aerobic endurance. Baseline: Currently pt reports he is fairly inactive. Goal status: INITIAL  2.  ABC scale to be completed w/ STG/LTG set as appropriate. Baseline: To be assessed. Goal status: INITIAL  3.  6MWT to be assessed w/ STG/LTG set as needed. Baseline: To be assessed. Goal status: INITIAL  4.  FGA to be assessed with STG/LTG set as appropriate. Baseline: To be assessed. Goal status: INITIAL  ASSESSMENT:  CLINICAL IMPRESSION: Patient is a 71 y.o. male who was seen today for physical therapy evaluation and treatment for CVA affecting left hemibody.  Pt has a significant PMH of Lumbar stenosis, aortic stenosis, TAVR procedure, HTN, DM II, pacemaker, chronic systolic CHF, GERD, hyperlipidemia, and major depression.  Identified impairments include generalized BLE weakness, decreased cardiovascular endurance, and imbalance symmetrical in presentation to clinic today.  Evaluation via the following assessment tools: 5xSTS indicate mild fall risk.  TUG time of 11.59 sec w/o AD as well as gait speed of 1.04 m/sec was WNL for age group.  Will further assess balance deficits w/ FGA and endurance challenges w/ 6MWT.  He would benefit from skilled PT to address impairments as noted and progress towards long term goals.  OBJECTIVE IMPAIRMENTS: Abnormal gait, decreased activity tolerance, decreased balance, decreased endurance, decreased strength, improper body mechanics, and postural dysfunction.   ACTIVITY LIMITATIONS: locomotion level  PARTICIPATION LIMITATIONS: community activity  PERSONAL FACTORS: Age, Behavior pattern, Fitness, Past/current experiences, and 1-2 comorbidities: HTN, lumbar stenosis  are also affecting patient's functional outcome.   REHAB POTENTIAL: Excellent  CLINICAL DECISION MAKING:  Stable/uncomplicated  EVALUATION COMPLEXITY: Low  PLAN:  PT FREQUENCY: 1x/week  PT DURATION: 8 weeks  PLANNED INTERVENTIONS: Therapeutic exercises, Therapeutic activity, Neuromuscular re-education, Balance training, Gait training, Patient/Family education, Self Care, Joint mobilization, Stair training, Vestibular training, DME instructions, and Re-evaluation  PLAN FOR NEXT SESSION: 6MWT, FGA - set LTGs, review and modify prior HEP and re-establish walking program.   Bary Richard, PT, DPT 03/29/2022, 2:41 PM

## 2022-03-29 NOTE — Therapy (Signed)
OUTPATIENT OCCUPATIONAL THERAPY NEURO EVALUATION  Patient Name: Tony [name] MRN: [id number] DOB:09/20/1950, 71 y.o., male Today's Date: 03/29/2022  PCP: Kathalene Frames, MD  REFERRING PROVIDER: Darliss Cheney, MD  END OF SESSION:  OT End of Session - 03/29/22 1331     Visit Number 1    Number of Visits 9    Date for OT Re-Evaluation 05/25/22    Authorization Type [employer] - requires prior authorization    Progress Note Due on Visit 9    OT Start Time 1239    OT Stop Time 1316    OT Time Calculation (min) 37 min    Activity Tolerance Patient tolerated treatment well    Behavior During Therapy Anxious;WFL for tasks assessed/performed             Past Medical History:  Diagnosis Date   Cancer ([place])    thyroid   CHF (congestive heart failure) ([place])    Complete heart block ([place])    a. 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 Burt Knack and Dr. Cyndia Bent   Severe aortic stenosis    Sleep apnea    Past Surgical History:  Procedure Laterality Date   CARDIAC CATHETERIZATION     INTRAOPERATIVE TRANSTHORACIC ECHOCARDIOGRAM N/A 08/29/2021   Procedure: INTRAOPERATIVE TRANSTHORACIC ECHOCARDIOGRAM;  Surgeon: [name], MD;  Location: [facility];  Service: Open Heart Surgery;  Laterality: N/A;   LUMBAR LAMINECTOMY/DECOMPRESSION MICRODISCECTOMY  07/19/2011   Procedure: LUMBAR LAMINECTOMY/DECOMPRESSION MICRODISCECTOMY 3 LEVELS;  Surgeon: Melina Schools, MD;  Location: [facility];  Service: Orthopedics;  Laterality: Left;  Lumbar three-Lumbar five LEFT DECOMPRESSION AND FORAMINOTOMY  Lumbar three-four LEFT DISCECTOMY   PERMANENT PACEMAKER INSERTION N/A 08/11/2014   MDT Adapta L implanted by Dr [name] for CHB   RIGHT HEART CATH AND CORONARY ANGIOGRAPHY N/A 08/07/2021   Procedure: RIGHT HEART CATH AND CORONARY ANGIOGRAPHY;  Surgeon: [name], MD;  Location: [facility];  Service: Cardiovascular;  Laterality: N/A;   RIGHT/LEFT HEART CATH AND CORONARY ANGIOGRAPHY N/A 08/07/2021   Procedure: RIGHT/LEFT HEART CATH AND CORONARY ANGIOGRAPHY;  Surgeon: [name], MD;  Location: [facility];  Service: Cardiovascular;  Laterality: N/A;   Thyroidectomy x2     TRANSCATHETER AORTIC VALVE REPLACEMENT, TRANSFEMORAL N/A 08/29/2021   Procedure: Transcatheter Aortic Valve Replacement, Transfemoral;  Surgeon: [name], MD;  Location: [facility];  Service: Open Heart Surgery;  Laterality: N/A;   Patient Active Problem List   Diagnosis Date Noted   Acute stroke due to ischemia ([place]) 03/22/2022   Aortic valve disorder 09/07/2021   Diabetic renal disease ([place]) 09/07/2021   Fatty liver 09/07/2021   Gastroesophageal reflux disease 09/07/2021   Heart block 09/07/2021   Hypercholesterolemia 09/07/2021   Mixed hyperlipidemia 09/07/2021   Hyperglycemia due to type 2 diabetes mellitus ([place]) 09/07/2021   Long term (current) use of insulin ([place]) 09/07/2021   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 associated with type 2 diabetes mellitus ([place]) 09/07/2021   Mitral regurgitation 09/07/2021  Moderate recurrent major depression ([place]) 09/07/2021   Obesity 09/07/2021   Severe recurrent major depression without psychotic features ([place]) 09/07/2021   S/P TAVR (transcatheter aortic valve replacement) 97/98/9211   Chronic systolic CHF (congestive heart failure) ([place]) 11/04/2019   Pacemaker 11/04/2019   Ganglion cyst 12/19/2015   Severe aortic stenosis 09/01/2014   Essential hypertension 09/01/2014   Type 2 diabetes mellitus  with peripheral neuropathy ([place]) 09/01/2014   Hypothyroidism 09/01/2014   OSA on CPAP 09/01/2014   Lumbar stenosis 07/16/2011   HNP (herniated nucleus pulposus), lumbar 07/16/2011    ONSET DATE: 03/22/2022  REFERRING DIAG: I63.9 (ICD-10-CM) - Cerebral infarction, unspecified mechanism   THERAPY DIAG:  Muscle weakness (generalized)  Other abnormalities of gait and mobility  Other lack of coordination  Visuospatial deficit  Rationale for Evaluation and Treatment: Rehabilitation  SUBJECTIVE:   SUBJECTIVE STATEMENT: Overall, he feels healthy. He had numbness in his L pinky, index finger, and thumb. He has noticed since hospitalization his handwriting has been worse. His numbness has gone away but he feels that both hands have been more stiff. He is a retired Product/process development scientist. [place] experiencing a "swooning" feeling. He fell hitting the left side of the body. He has not had any imaging done. He feels he is more "scattered". He has been staying with his daughter since the hospitalization.   Pt accompanied by: self  PERTINENT HISTORY: [name], "SON BARKAN is a 72 y.o. male who presents for new onset of facial droop, numbness.  Per patient, he was at home, had just eaten an ice cream sandwich, when he felt tingling in the right side of his face, heaviness of the left hand and had a facial droop per family.  His daughter is an anesthesiologist and his son is a paramedic, they responded quickly and he was brought to the ED. CT head showed mild chronic changes.  CTA of the head and neck showed some moderate intracranial atherosclerosis.  Labs showed an elevated Cr to 1.48, Glucose of 169.  Baseline Cr appears to be around 1.1 - 1.2.  T bili of 1.8, GFR of 50, H/H 15 and 45.  INR 1.0."   Discharged from hospital 03/23/2022 with PT and OT orders.  PRECAUTIONS: Fall and ICD/Pacemaker  WEIGHT BEARING RESTRICTIONS:  No  PAIN:  Are you having pain? Yes: NPRS scale: 4/10 Pain location: R groin Pain description: grabbing Aggravating factors: walking Relieving factors: rest, stretching  FALLS: Has patient fallen in last 6 months? Yes. Number of falls 3  LIVING ENVIRONMENT: Lives with: lives alone Lives in: House/apartment Stairs: Yes: External: 15 steps; can reach both Has following equipment at home: Single point cane and Grab bars  PLOF: Independent; retired; driving  PATIENT GOALS: Remain independent  OBJECTIVE:   HAND DOMINANCE: Right  ADLs: Overall ADLs: mod I with increased time/caution  IADLs: Shopping: dependent Light housekeeping: dependent Meal Prep: mod I; typically eats out or does light meal prep; he has cardiac recipes for meal planning Community mobility: currently driving Medication management: independent Financial management: independent Handwriting: 90% legible and Increased time  MOBILITY STATUS:  mod I with increased time  ACTIVITY TOLERANCE: Activity tolerance: fair  UPPER EXTREMITY ROM:     AROM Right (eval) Left (eval)  Shoulder flexion WNL WNL  Shoulder abduction WNL WNL  Elbow flexion WNL WNL  Elbow extension WNL WNL  Wrist flexion WNL WNL  Wrist extension WNL WNL  Wrist pronation WNL WNL  Wrist supination WNL WNL   Digit Composite Flexion WNL WNL  Digit Composite Extension WNL WNL  Digit Opposition WNL WNL  (Blank rows = not tested)  UPPER EXTREMITY MMT:     BUEs WFL but limited by endurance  HAND FUNCTION: Grip strength: Right: 91.2 lbs; Left: 65.2 lbs - endorses being weaker on L at baseline but thinks it is worse since hospitalization  COORDINATION: Finger Nose Finger test: WFL; with known issues with depth perception 9 Hole Peg test: Right: 35 sec; Left: 39 sec  SENSATION: WFL  EDEMA: none visable  MUSCLE TONE: RUE: Within functional limits and LUE: Within functional limits  COGNITION: Overall cognitive status: Within  functional limits for tasks assessed and some difficulty with STM but seems age appropriate; will continue to assess.  VISION: Subjective report: No changes since his hospitalization Baseline vision: Wears glasses all the time and Light perception only in Right eye from previous CVA; he broke his glasses  Visual history: cataracts and corrective eye surgery  VISION ASSESSMENT: To be further assessed in functional context; able to read at distance and near  PERCEPTION: WFL  PRAXIS: WFL  OBSERVATIONS: Ambulates with altered gait though no LOB. No use of AD. He appears well-kept.    TODAY'S TREATMENT:                                                                                                                               N/a - assessment only  PATIENT EDUCATION: Education details: OT POC Person educated: Patient Education method: Explanation Education comprehension: verbalized understanding  HOME EXERCISE PROGRAM: N/a at time of eval   GOALS:   SHORT TERM GOALS: Target date: 04/26/2022    Patient will demonstrate initial HEP with 25% verbal cues or less for proper execution. Baseline: Goal status: INITIAL  2.  Pt will verbalize understanding of vision strategies to improve safety with functional mobility. Baseline:  Goal status: INITIAL   LONG TERM GOALS: Target date: 05/25/2022    Patient will be independent with updated BUE HEP. Baseline:  Goal status: INITIAL  2.  Pt will demonstrate at least 75 lbs L grip strength.  Baseline: 65 lbs Goal status: INITIAL  3.  Pt will improve 9 hole peg test score on R and L by at least 5 seconds. Baseline: Right: 35 sec; Left: 39 sec Goal status: INITIAL  4.  Pt will demonstrate good safety while completing functional standing task(s) for at least 15 minutes without rest break. Baseline:  Goal status: INITIAL  ASSESSMENT:  CLINICAL IMPRESSION: Patient is a 71 y.o. male who was seen today for occupational therapy  evaluation following CVA. Hx includes CHB with PCM in place, combined CHF (EF 40-45%), DM2, GERD, HTN, hypothyroidism s/p thyroidectomy, TAVR 2/2 AS, OSA, BPH, and prior CVA (10 years ago). Patient currently presents slightly below baseline level of functioning demonstrating functional deficits and impairments as noted below. Pt would  benefit from skilled OT services in the outpatient setting to work on impairments as noted below to help pt return to PLOF as able.    PERFORMANCE DEFICITS: in functional skills including ADLs, IADLs, coordination, proprioception, strength, pain, Fine motor control, mobility, balance, body mechanics, endurance, cardiopulmonary status limiting function, vision, and UE functional use, cognitive skills including attention, memory, safety awareness, thought, and may be age appropriate but will continue to monitor , and psychosocial skills including routines and behaviors.   IMPAIRMENTS: are limiting patient from ADLs, IADLs, and leisure.   CO-MORBIDITIES: may have co-morbidities  that affects occupational performance. Patient will benefit from skilled OT to address above impairments and improve overall function.  MODIFICATION OR ASSISTANCE TO COMPLETE EVALUATION: No modification of tasks or assist necessary to complete an evaluation.  OT OCCUPATIONAL PROFILE AND HISTORY: Detailed assessment: Review of records and additional review of physical, cognitive, psychosocial history related to current functional performance.  CLINICAL DECISION MAKING: LOW - limited treatment options, no task modification necessary  REHAB POTENTIAL: Good  EVALUATION COMPLEXITY: Low    PLAN:  OT FREQUENCY: 1x/week  OT DURATION: 8 weeks  PLANNED INTERVENTIONS: self care/ADL training, therapeutic exercise, therapeutic activity, neuromuscular re-education, manual therapy, gait training, balance training, stair training, functional mobility training, ultrasound, paraffin, fluidotherapy, moist  heat, patient/family education, cognitive remediation/compensation, visual/perceptual remediation/compensation, psychosocial skills training, energy conservation, coping strategies training, DME and/or AE instructions, and Re-evaluation  RECOMMENDED OTHER SERVICES: Will continue to monitor cognition. He may benefit from ST eval.   CONSULTED AND AGREED WITH PLAN OF CARE: Patient  PLAN FOR NEXT SESSION: safety with functional activities; vision strategies; BUE theraband and putty for strengthening/coordination   Dennis Bast, OT 03/29/2022, 1:58 PM

## 2022-04-10 DIAGNOSIS — E1165 Type 2 diabetes mellitus with hyperglycemia: Secondary | ICD-10-CM | POA: Diagnosis not present

## 2022-04-11 ENCOUNTER — Ambulatory Visit: Payer: Medicare PPO | Admitting: Physical Therapy

## 2022-04-12 DIAGNOSIS — Z0000 Encounter for general adult medical examination without abnormal findings: Secondary | ICD-10-CM | POA: Diagnosis not present

## 2022-04-12 DIAGNOSIS — E1121 Type 2 diabetes mellitus with diabetic nephropathy: Secondary | ICD-10-CM | POA: Diagnosis not present

## 2022-04-12 DIAGNOSIS — G4733 Obstructive sleep apnea (adult) (pediatric): Secondary | ICD-10-CM | POA: Diagnosis not present

## 2022-04-12 DIAGNOSIS — E039 Hypothyroidism, unspecified: Secondary | ICD-10-CM | POA: Diagnosis not present

## 2022-04-12 DIAGNOSIS — I10 Essential (primary) hypertension: Secondary | ICD-10-CM | POA: Diagnosis not present

## 2022-04-12 DIAGNOSIS — E782 Mixed hyperlipidemia: Secondary | ICD-10-CM | POA: Diagnosis not present

## 2022-04-12 DIAGNOSIS — I459 Conduction disorder, unspecified: Secondary | ICD-10-CM | POA: Diagnosis not present

## 2022-04-12 DIAGNOSIS — I350 Nonrheumatic aortic (valve) stenosis: Secondary | ICD-10-CM | POA: Diagnosis not present

## 2022-04-12 DIAGNOSIS — F331 Major depressive disorder, recurrent, moderate: Secondary | ICD-10-CM | POA: Diagnosis not present

## 2022-04-12 DIAGNOSIS — I5022 Chronic systolic (congestive) heart failure: Secondary | ICD-10-CM | POA: Diagnosis not present

## 2022-04-12 DIAGNOSIS — E7800 Pure hypercholesterolemia, unspecified: Secondary | ICD-10-CM | POA: Diagnosis not present

## 2022-04-13 ENCOUNTER — Encounter: Payer: Self-pay | Admitting: Physical Therapy

## 2022-04-13 ENCOUNTER — Ambulatory Visit: Payer: Medicare PPO | Attending: Internal Medicine | Admitting: Physical Therapy

## 2022-04-13 DIAGNOSIS — M6281 Muscle weakness (generalized): Secondary | ICD-10-CM | POA: Diagnosis not present

## 2022-04-13 DIAGNOSIS — I69354 Hemiplegia and hemiparesis following cerebral infarction affecting left non-dominant side: Secondary | ICD-10-CM

## 2022-04-13 DIAGNOSIS — R41842 Visuospatial deficit: Secondary | ICD-10-CM | POA: Diagnosis not present

## 2022-04-13 DIAGNOSIS — R278 Other lack of coordination: Secondary | ICD-10-CM | POA: Diagnosis not present

## 2022-04-13 DIAGNOSIS — R2681 Unsteadiness on feet: Secondary | ICD-10-CM | POA: Diagnosis not present

## 2022-04-13 DIAGNOSIS — R2689 Other abnormalities of gait and mobility: Secondary | ICD-10-CM

## 2022-04-13 NOTE — Patient Instructions (Signed)
Access Code: [email or web address] URL: [email or web address] Date: 04/13/2022 Prepared by: Elease Etienne  Exercises - Sit to Stand with Arms Crossed  - 2 x daily - 7 x weekly - 1 sets - 10 reps - Standing Near Stance in Corner with Eyes Closed  - 1 x daily - 5 x weekly - 1 sets - 3 reps - 30 seconds hold - Standing in corner with eyes closed  - 1 x daily - 5 x weekly - 1 sets - 10 reps - Tandem Walking with Counter Support  - 1 x daily - 5 x weekly - 1 sets - 3 reps - Toe Walking with Counter Support  - 1 x daily - 5 x weekly - 1 sets - 3 reps - Heel Walking with Counter Support  - 1 x daily - 5 x weekly - 1 sets - 3 reps - Seated head rotations  - 2 x daily - 7 x weekly - 1 sets - 20 reps - Seated Head Nods Vestibular Habituation  - 2 x daily - 7 x weekly - 1 sets - 20 reps - Standing with Head Rotation  - 1 x daily - 7 x weekly - 2 sets - 10 reps - Standing with Head Nod  - 1 x daily - 7 x weekly - 1 sets - 20 reps - Backward Walking with Counter Support  - 1 x daily - 5 x weekly - 3 sets - 10 reps - Walking with Eyes Closed and Counter Support  - 1 x daily - 5 x weekly - 3 sets - 10 reps

## 2022-04-13 NOTE — Therapy (Signed)
OUTPATIENT PHYSICAL THERAPY NEURO TREATMENT   Patient Name: Tony [name] MRN: [id number] DOB:1951-04-02, 71 y.o., male Today's Date: 04/13/2022   PCP: Kathalene Frames, MD REFERRING PROVIDER: Darliss Cheney, MD  END OF SESSION:  PT End of Session - 04/13/22 0847     Visit Number 2    Number of Visits 9   8+eval   Date for PT Re-Evaluation 06/01/22   pushed out due to potential delay in PT availability   Authorization Type [employer] MEDICARE    PT Start Time 0847    PT Stop Time 0925    PT Time Calculation (min) 38 min    Equipment Utilized During Treatment Gait belt    Activity Tolerance Patient tolerated treatment well    Behavior During Therapy WFL for tasks assessed/performed             Past Medical History:  Diagnosis Date   Cancer ([place])    thyroid   CHF (congestive heart failure) ([place])    Complete heart block ([place])    a. 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 Burt Knack and Dr. Cyndia Bent   Severe aortic stenosis    Sleep apnea    Past Surgical History:  Procedure Laterality Date   CARDIAC CATHETERIZATION     INTRAOPERATIVE TRANSTHORACIC ECHOCARDIOGRAM N/A 08/29/2021   Procedure: INTRAOPERATIVE TRANSTHORACIC ECHOCARDIOGRAM;  Surgeon: [name], MD;  Location: [facility];  Service: Open Heart Surgery;  Laterality: N/A;   LUMBAR LAMINECTOMY/DECOMPRESSION MICRODISCECTOMY  07/19/2011   Procedure: LUMBAR LAMINECTOMY/DECOMPRESSION MICRODISCECTOMY 3 LEVELS;  Surgeon: Melina Schools, MD;  Location: [facility];  Service: Orthopedics;  Laterality: Left;  Lumbar three-Lumbar five  LEFT DECOMPRESSION AND FORAMINOTOMY Lumbar three-four LEFT DISCECTOMY   PERMANENT PACEMAKER INSERTION N/A 08/11/2014   MDT Adapta L implanted by Dr [name] for CHB   RIGHT HEART CATH AND CORONARY ANGIOGRAPHY N/A 08/07/2021   Procedure: RIGHT HEART CATH AND CORONARY ANGIOGRAPHY;  Surgeon: [name], MD;  Location: [facility];  Service: Cardiovascular;  Laterality: N/A;   RIGHT/LEFT HEART CATH AND CORONARY ANGIOGRAPHY N/A 08/07/2021   Procedure: RIGHT/LEFT HEART CATH AND CORONARY ANGIOGRAPHY;  Surgeon: [name], MD;  Location: [facility];  Service: Cardiovascular;  Laterality: N/A;   Thyroidectomy x2     TRANSCATHETER AORTIC VALVE REPLACEMENT, TRANSFEMORAL N/A 08/29/2021   Procedure: Transcatheter Aortic Valve Replacement, Transfemoral;  Surgeon: [name], MD;  Location: [facility];  Service: Open Heart Surgery;  Laterality: N/A;   Patient Active Problem List   Diagnosis Date Noted   Acute stroke due to ischemia ([place]) 03/22/2022   Aortic valve disorder 09/07/2021   Diabetic renal disease ([place]) 09/07/2021   Fatty liver 09/07/2021   Gastroesophageal reflux disease 09/07/2021   Heart block 09/07/2021   Hypercholesterolemia 09/07/2021   Mixed hyperlipidemia 09/07/2021   Hyperglycemia due to type 2 diabetes mellitus ([place]) 09/07/2021   Long term (current) use of insulin ([place]) 09/07/2021   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 associated with type 2 diabetes mellitus (  [place]) 09/07/2021   Mitral regurgitation 09/07/2021   Moderate recurrent major depression ([place]) 09/07/2021   Obesity 09/07/2021   Severe recurrent major depression without psychotic features ([place]) 09/07/2021   S/P TAVR (transcatheter aortic valve replacement) 96/78/9381   Chronic systolic CHF (congestive heart failure) ([place]) 11/04/2019   Pacemaker 11/04/2019   Ganglion cyst 12/19/2015   Severe aortic stenosis 09/01/2014   Essential hypertension  09/01/2014   Type 2 diabetes mellitus with peripheral neuropathy ([place]) 09/01/2014   Hypothyroidism 09/01/2014   OSA on CPAP 09/01/2014   Lumbar stenosis 07/16/2011   HNP (herniated nucleus pulposus), lumbar 07/16/2011    ONSET DATE: 03/23/2022 (referral)  REFERRING DIAG: I63.9 (ICD-10-CM) - Cerebral infarction, unspecified mechanism ([place])  THERAPY DIAG:  Unsteadiness on feet  Muscle weakness (generalized)  Hemiplegia and hemiparesis following cerebral infarction affecting left non-dominant side (H[facility])  Other abnormalities of gait and mobility  Other lack of coordination  Rationale for Evaluation and Treatment: Rehabilitation  SUBJECTIVE:                                                                                                                                                                                             SUBJECTIVE STATEMENT: Pt denies falls or other acute changes.  He saw new MD yesterday and he states they would like for him to follow up with his neurologist. Pt accompanied by: self  PERTINENT HISTORY: Lumbar stenosis, aortic stenosis, HTN, DM II, pacemaker, chronic systolic CHF, GERD, hyperlipidemia, major depression  Pt presented to the ED 03/22/2022 w/ new onset of tingling in the left side of his face, heaviness of the left hand and had a facial droop per family.  "He has had chronic issues with lightheadedness, low blood pressures and disequilibrium since having his TAVR procedure.  He has been in rehab and has started getting strength back. He has chronic right sided blindness, due to previous CVA per report from family.  This was about 10 years ago."  PAIN:  Are you having pain? 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    Change in Gait Speed Able to change speed, demonstrates mild gait deviations, deviates 6-10 in outside of the 12 in walkway width, or no gait deviations, unable to achieve a major change in velocity, or uses a change in velocity, or uses an assistive device.    Gait with Horizontal Head Turns Performs head turns smoothly with slight change in gait velocity (eg, minor disruption to smooth gait path), deviates 6-10 in outside 12 in walkway width, or uses an assistive device.    Gait with Vertical Head Turns Performs task with slight change in gait velocity (eg, minor disruption to smooth gait path), deviates 6 - 10 in outside 12 in walkway width or uses assistive device    Gait and Pivot Turn Pivot turns safely in greater than 3 sec and stops with no loss of balance, or pivot turns safely within 3 sec and stops with mild imbalance, requires small steps to catch balance.    Step Over Obstacle Is able to step over one shoe box (4.5 in total height) but must slow down and adjust steps to clear box safely. May require verbal cueing.    Gait with Narrow Base of Support Ambulates 4-7 steps.    Gait with Eyes Closed Walks 20 ft, slow speed, abnormal gait pattern, evidence for imbalance, deviates 10-15 in outside 12 in walkway width. Requires more than 9 sec to ambulate 20 ft.    Ambulating Backwards Walks 20 ft, uses assistive device, slower speed, mild gait deviations, deviates 6-10 in outside 12 in walkway width.    Steps Alternating feet, must use  rail.    Total Score 17    FGA comment: 17/30; high fall risk            Initiated HEP: -Tandem walking at counter -Eyes closed walking at counter -Backwards walking at counter -Verbally reviewed existing exercises from prior episode of care, pt has not been doing these so reprinted for pt and will further physically review in coming session.  Initiated Walking Program:  You Can Walk For A Certain Length Of Time Each Day                          Walk 10 minutes 1 times per day.             Increase 2-5  minutes every 7 days              Work up to 20 minutes (1-2 times per day).               Example:                         Day 1-2           4-5 minutes     3 times per day  Day 7-8           10-12 minutes 2-3 times per day                         Day 13-14       20-22 minutes 1-2 times per day   PATIENT EDUCATION: Education details: Assessment outcomes and initial HEP. Person educated: Patient Education method: Explanation Education comprehension: verbalized understanding  HOME EXERCISE PROGRAM:  Access Code: [email or web address] URL: [email or web address] Date: 04/13/2022 Prepared by: Elease Etienne  Exercises - Sit to Stand with Arms Crossed  - 2 x daily - 7 x weekly - 1 sets - 10 reps - Standing Near Stance in Corner with Eyes Closed  - 1 x daily - 5 x weekly - 1 sets - 3 reps - 30 seconds hold - Standing in corner with eyes closed  - 1 x daily - 5 x weekly - 1 sets - 10 reps - Tandem Walking with Counter Support  - 1 x daily - 5 x weekly - 1 sets - 3 reps - Toe Walking with Counter Support  - 1 x daily - 5 x weekly - 1 sets - 3 reps - Heel Walking with Counter Support  - 1 x daily - 5 x weekly - 1 sets - 3 reps - Seated head rotations  - 2 x daily - 7 x weekly - 1 sets - 20 reps - Seated Head Nods Vestibular Habituation  - 2 x daily - 7 x weekly - 1 sets - 20 reps - Standing with Head Rotation  - 1 x daily - 7 x weekly - 2 sets - 10  reps - Standing with Head Nod  - 1 x daily - 7 x weekly - 1 sets - 20 reps - Backward Walking with Counter Support  - 1 x daily - 5 x weekly - 3 sets - 10 reps - Walking with Eyes Closed and Counter Support  - 1 x daily - 5 x weekly - 3 sets - 10 reps  You Can Walk For A Certain Length Of Time Each Day                          Walk 10 minutes 1 times per day.             Increase 2-5  minutes every 7 days              Work up to 20 minutes (1-2 times per day).               Example:                         Day 1-2           4-5 minutes     3 times per day                         Day 7-8           10-12 minutes 2-3 times per day                         Day 13-14       20-22 minutes 1-2 times per day  GOALS: Goals reviewed with patient? Yes  SHORT TERM GOALS: Target date: 04/27/2022  Pt will be independent  with strength and balance HEP to maintain gains at home. Baseline:  To be reviewed and updated. Goal status: INITIAL  2.  Pt will improve balance confidence to >70% on ABC scale to demonstrate decreased fear of falling. Baseline:  62.5% (12/15) Goal status: INITIAL  3.  Pt will decrease 5xSTS to </=13 seconds in order to demonstrate decreased risk for falls and improved functional bilateral LE strength and power. Baseline: 14.84 sec no UE support Goal status: INITIAL  4.  Pt will ambulate >1200 feet on 6MWT w/o dyspnea or increased left trunk lean to demonstrate improved functional endurance for home and community participation. Baseline:  1205' w/ inc left trunk lean and dyspnea (12/15) Goal status: INITIAL  5.  Pt will improve FGA score to >/=22/30 in order to demonstrate improved balance and decreased fall risk. Baseline: 17/30 (12/15) Goal status: INITIAL  LONG TERM GOALS: Target date: 05/25/2022  Pt will be compliant to walking program no less than 3 days per week to promote aerobic endurance. Baseline: Currently pt reports he is fairly inactive. Goal status:  INITIAL  2.  Pt will improve balance confidence to >80% on ABC scale to demonstrate decreased fear of falling. Baseline: 62.5% (12/15) Goal status: INITIAL  3.  Pt will ambulate >/=1400 feet on 6MWT to demonstrate improved functional endurance for home and community participation. Baseline: 1205' (12/15) Goal status: INITIAL  4.  Pt will improve FGA score to >/=27/30 in order to demonstrate improved balance and decreased fall risk. Baseline: 17/30 (12/15) Goal status: INITIAL  ASSESSMENT:  CLINICAL IMPRESSION: Focus of skilled session on assessing FGA w/ pt scoring 17/30 indicating elevated fall risk.  He ambulates >1000' during 6MWT w/ signs of fatigue and postural changes.  He indicates moderate fear of falling based on score of 62.5% on ABC scale.  Partially reviewed and modified existing HEP from prior episode of care to promote pt engagement in activity outside the clinic as he reports he has not been very active recently.  Will continue to address deficits noted in ongoing skilled PT POC.  OBJECTIVE IMPAIRMENTS: Abnormal gait, decreased activity tolerance, decreased balance, decreased endurance, decreased strength, improper body mechanics, and postural dysfunction.   ACTIVITY LIMITATIONS: locomotion level  PARTICIPATION LIMITATIONS: community activity  PERSONAL FACTORS: Age, Behavior pattern, Fitness, Past/current experiences, and 1-2 comorbidities: HTN, lumbar stenosis  are also affecting patient's functional outcome.   REHAB POTENTIAL: Excellent  CLINICAL DECISION MAKING: Stable/uncomplicated  EVALUATION COMPLEXITY: Low  PLAN:  PT FREQUENCY: 1x/week  PT DURATION: 8 weeks  PLANNED INTERVENTIONS: Therapeutic exercises, Therapeutic activity, Neuromuscular re-education, Balance training, Gait training, Patient/Family education, Self Care, Joint mobilization, Stair training, Vestibular training, DME instructions, and Re-evaluation  PLAN FOR NEXT SESSION: Finish reviewing  and shortening prior HEP, deficits from FGA, left trunk lean w/ gait, endurance   Bary Richard, PT, DPT 04/13/2022, 9:33 AM

## 2022-04-19 ENCOUNTER — Ambulatory Visit: Payer: Medicare PPO | Admitting: Occupational Therapy

## 2022-04-19 ENCOUNTER — Ambulatory Visit: Payer: Medicare PPO | Admitting: Physical Therapy

## 2022-04-26 ENCOUNTER — Encounter: Payer: Self-pay | Admitting: Occupational Therapy

## 2022-04-26 ENCOUNTER — Ambulatory Visit: Payer: Medicare PPO | Admitting: Occupational Therapy

## 2022-04-26 ENCOUNTER — Encounter: Payer: Self-pay | Admitting: Physical Therapy

## 2022-04-26 ENCOUNTER — Ambulatory Visit: Payer: Medicare PPO | Admitting: Physical Therapy

## 2022-04-26 DIAGNOSIS — R2681 Unsteadiness on feet: Secondary | ICD-10-CM | POA: Diagnosis not present

## 2022-04-26 DIAGNOSIS — R278 Other lack of coordination: Secondary | ICD-10-CM

## 2022-04-26 DIAGNOSIS — R2689 Other abnormalities of gait and mobility: Secondary | ICD-10-CM | POA: Diagnosis not present

## 2022-04-26 DIAGNOSIS — M6281 Muscle weakness (generalized): Secondary | ICD-10-CM

## 2022-04-26 DIAGNOSIS — I69354 Hemiplegia and hemiparesis following cerebral infarction affecting left non-dominant side: Secondary | ICD-10-CM

## 2022-04-26 DIAGNOSIS — R41842 Visuospatial deficit: Secondary | ICD-10-CM

## 2022-04-26 NOTE — Therapy (Signed)
OUTPATIENT PHYSICAL THERAPY NEURO TREATMENT   Patient Name: Tony [name] MRN: [id number] DOB:[birth year], 71 y.o., male Today's Date: 04/26/2022   PCP: Kathalene Frames, MD REFERRING PROVIDER: Darliss Cheney, MD  END OF SESSION:  PT End of Session - 04/26/22 1150     Visit Number 3    Number of Visits 9   8+eval   Date for PT Re-Evaluation 06/01/22   pushed out due to potential delay in PT availability   Authorization Type [employer] MEDICARE    PT Start Time 1148    PT Stop Time 40    PT Time Calculation (min) 42 min    Equipment Utilized During Treatment Gait belt    Activity Tolerance Patient tolerated treatment well    Behavior During Therapy WFL for tasks assessed/performed             Past Medical History:  Diagnosis Date   Cancer ([place])    thyroid   CHF (congestive heart failure) ([place])    Complete heart block ([place])    a. 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 Burt Knack and Dr. Cyndia Bent   Severe aortic stenosis    Sleep apnea    Past Surgical History:  Procedure Laterality Date   CARDIAC CATHETERIZATION     INTRAOPERATIVE TRANSTHORACIC ECHOCARDIOGRAM N/A 08/29/2021   Procedure: INTRAOPERATIVE TRANSTHORACIC ECHOCARDIOGRAM;  Surgeon: [name], MD;  Location: [facility];  Service: Open Heart Surgery;  Laterality: N/A;   LUMBAR LAMINECTOMY/DECOMPRESSION MICRODISCECTOMY  07/19/2011   Procedure: LUMBAR LAMINECTOMY/DECOMPRESSION MICRODISCECTOMY 3 LEVELS;  Surgeon: Melina Schools, MD;  Location: [facility];  Service: Orthopedics;  Laterality: Left;  Lumbar three-Lumbar five  LEFT DECOMPRESSION AND FORAMINOTOMY Lumbar three-four LEFT DISCECTOMY   PERMANENT PACEMAKER INSERTION N/A 08/11/2014   MDT Adapta L implanted by Dr [name] for CHB   RIGHT HEART CATH AND CORONARY ANGIOGRAPHY N/A 08/07/2021   Procedure: RIGHT HEART CATH AND CORONARY ANGIOGRAPHY;  Surgeon: [name], MD;  Location: [facility];  Service: Cardiovascular;  Laterality: N/A;   RIGHT/LEFT HEART CATH AND CORONARY ANGIOGRAPHY N/A 08/07/2021   Procedure: RIGHT/LEFT HEART CATH AND CORONARY ANGIOGRAPHY;  Surgeon: [name], MD;  Location: [facility];  Service: Cardiovascular;  Laterality: N/A;   Thyroidectomy x2     TRANSCATHETER AORTIC VALVE REPLACEMENT, TRANSFEMORAL N/A 08/29/2021   Procedure: Transcatheter Aortic Valve Replacement, Transfemoral;  Surgeon: [name], MD;  Location: [facility];  Service: Open Heart Surgery;  Laterality: N/A;   Patient Active Problem List   Diagnosis Date Noted   Acute stroke due to ischemia ([place]) 03/22/2022   Aortic valve disorder 09/07/2021   Diabetic renal disease ([place]) 09/07/2021   Fatty liver 09/07/2021   Gastroesophageal reflux disease 09/07/2021   Heart block 09/07/2021   Hypercholesterolemia 09/07/2021   Mixed hyperlipidemia 09/07/2021   Hyperglycemia due to type 2 diabetes mellitus ([place]) 09/07/2021   Long term (current) use of insulin ([place]) 09/07/2021   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 associated with type 2 diabetes mellitus (  [place]) 09/07/2021   Mitral regurgitation 09/07/2021   Moderate recurrent major depression ([place]) 09/07/2021   Obesity 09/07/2021   Severe recurrent major depression without psychotic features ([place]) 09/07/2021   S/P TAVR (transcatheter aortic valve replacement) 16/01/9603   Chronic systolic CHF (congestive heart failure) ([place]) 11/04/2019   Pacemaker 11/04/2019   Ganglion cyst 12/19/2015   Severe aortic stenosis 09/01/2014   Essential hypertension  09/01/2014   Type 2 diabetes mellitus with peripheral neuropathy ([place]) 09/01/2014   Hypothyroidism 09/01/2014   OSA on CPAP 09/01/2014   Lumbar stenosis 07/16/2011   HNP (herniated nucleus pulposus), lumbar 07/16/2011    ONSET DATE: 03/23/2022 (referral)  REFERRING DIAG: I63.9 (ICD-10-CM) - Cerebral infarction, unspecified mechanism ([place])  THERAPY DIAG:  Unsteadiness on feet  Muscle weakness (generalized)  Hemiplegia and hemiparesis following cerebral infarction affecting left non-dominant side (HCC)  Other lack of coordination  Other abnormalities of gait and mobility  Rationale for Evaluation and Treatment: Rehabilitation  SUBJECTIVE:                                                                                                                                                                                             SUBJECTIVE STATEMENT: Pt denies falls or other acute changes.  He missed last appt because he lost his phone and then could not find a ride once his neighbor brought him his phone. Pt accompanied by: self  PERTINENT HISTORY: Lumbar stenosis, aortic stenosis, HTN, DM II, pacemaker, chronic systolic CHF, GERD, hyperlipidemia, major depression  Pt presented to the ED 03/22/2022 w/ new onset of tingling in the left side of his face, heaviness of the left hand and had a facial droop per family.  "He has had chronic issues with lightheadedness, low blood pressures and disequilibrium since having his TAVR procedure.  He has been in rehab and has started getting strength back. He has chronic right sided blindness, due to previous CVA per report from family.  This was about 10 years ago."  PAIN:  Are you having pain? 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 per pt report  OBJECTIVE:  TODAY'S TREATMENT:  DATE:  Reviewed remaining HEP exercises: -Toe Walking with Counter Support 4x10' - Heel Walking with Counter Support 4x10' - Seated head rotations x30 sec, no dizziness - Seated Head Nods Vestibular Habituation x30 sec, no dizziness - Standing with Head Rotation x30 sec, no dizziness - Standing with Head Nod x30 sec  -Red light, green light varying length of on and off time going forward and then backward, 3 rounds cued for increased speed of walking each round, w/ inc speed notable crossover steps and trunk lean -Forwards and backwards walking w/ head turns, frequent crossover steps on initiation of task somewhat resolved when cued to prevent head movement prior to stepping -Forward and lateral 6" step taps x20 each direction, pt has posterior LOB repeatedly w/ lateral taps -Standing feet apart horizontal perturbations w/ body blade > alt LE elevated on 6" step w/ unilateral vertical perturbations w/ body blade, pt has more stability on RLE and single LOB on LLE -[place] mode at level 5.0 x8 minutes using BUE/BLE for reciprocal mobility and endurance, pt reports RPE of 4/10 midway through.  PATIENT EDUCATION: Education details: Remaining HEP and safety precautions like using counter support and being mindful of form and speed of movement which worsen his imbalance. Person educated: Patient Education method: Explanation Education comprehension: verbalized understanding  HOME EXERCISE PROGRAM:  Access Code: [email or web address] URL: [email or web address] Date: 04/26/2022 Prepared by: Elease Etienne  Exercises - Sit to Stand with Arms Crossed  - 2 x daily - 7 x weekly - 1 sets - 10 reps - Standing Near Stance in Corner with Eyes Closed  - 1 x daily - 5 x weekly - 1 sets - 3 reps - 30 seconds hold - Standing in corner with eyes closed  - 1 x daily - 5 x weekly - 1 sets - 10 reps - Tandem Walking with Counter Support  - 1 x daily - 5 x weekly - 1 sets - 3 reps -  Toe Walking with Counter Support  - 1 x daily - 5 x weekly - 1 sets - 3 reps - Heel Walking with Counter Support  - 1 x daily - 5 x weekly - 1 sets - 3 reps - Backward Walking with Counter Support  - 1 x daily - 5 x weekly - 3 sets - 10 reps - Walking with Eyes Closed and Counter Support  - 1 x daily - 5 x weekly - 3 sets - 10 reps  You Can Walk For A Certain Length Of Time Each Day                          Walk 10 minutes 1 times per day.             Increase 2-5  minutes every 7 days              Work up to 20 minutes (1-2 times per day).               Example:                         Day 1-2           4-5 minutes     3 times per day                         Day 7-8  10-12 minutes 2-3 times per day                         Day 13-14       20-22 minutes 1-2 times per day  GOALS: Goals reviewed with patient? Yes  SHORT TERM GOALS: Target date: 04/27/2022  Pt will be independent with strength and balance HEP to maintain gains at home. Baseline:  To be reviewed and updated. Goal status: INITIAL  2.  Pt will improve balance confidence to >70% on ABC scale to demonstrate decreased fear of falling. Baseline:  62.5% (12/15) Goal status: INITIAL  3.  Pt will decrease 5xSTS to </=13 seconds in order to demonstrate decreased risk for falls and improved functional bilateral LE strength and power. Baseline: 14.84 sec no UE support Goal status: INITIAL  4.  Pt will ambulate >1200 feet on 6MWT w/o dyspnea or increased left trunk lean to demonstrate improved functional endurance for home and community participation. Baseline:  1205' w/ inc left trunk lean and dyspnea (12/15) Goal status: INITIAL  5.  Pt will improve FGA score to >/=22/30 in order to demonstrate improved balance and decreased fall risk. Baseline: 17/30 (12/15) Goal status: INITIAL  LONG TERM GOALS: Target date: 05/25/2022  Pt will be compliant to walking program no less than 3 days per week to promote aerobic  endurance. Baseline: Currently pt reports he is fairly inactive. Goal status: INITIAL  2.  Pt will improve balance confidence to >80% on ABC scale to demonstrate decreased fear of falling. Baseline: 62.5% (12/15) Goal status: INITIAL  3.  Pt will ambulate >/=1400 feet on 6MWT to demonstrate improved functional endurance for home and community participation. Baseline: 1205' (12/15) Goal status: INITIAL  4.  Pt will improve FGA score to >/=27/30 in order to demonstrate improved balance and decreased fall risk. Baseline: 17/30 (12/15) Goal status: INITIAL  ASSESSMENT:  CLINICAL IMPRESSION: STGs to be assessed next session as therapist overlooked them today by accident.  He continues to struggle with dynamic imbalance without reports of dizziness or other vestibular signs and symptoms.  Finished reviewing and tailoring HEP to match current deficits.  He demonstrates cross over step and ongoing lateral trunk lean with decreased righting reactions during higher level tasks this session.  Will continue to address these and other deficits as outlined in ongoing skilled PT POC.    OBJECTIVE IMPAIRMENTS: Abnormal gait, decreased activity tolerance, decreased balance, decreased endurance, decreased strength, improper body mechanics, and postural dysfunction.   ACTIVITY LIMITATIONS: locomotion level  PARTICIPATION LIMITATIONS: community activity  PERSONAL FACTORS: Age, Behavior pattern, Fitness, Past/current experiences, and 1-2 comorbidities: HTN, lumbar stenosis  are also affecting patient's functional outcome.   REHAB POTENTIAL: Excellent  CLINICAL DECISION MAKING: Stable/uncomplicated  EVALUATION COMPLEXITY: Low  PLAN:  PT FREQUENCY: 1x/week  PT DURATION: 8 weeks  PLANNED INTERVENTIONS: Therapeutic exercises, Therapeutic activity, Neuromuscular re-education, Balance training, Gait training, Patient/Family education, Self Care, Joint mobilization, Stair training, Vestibular training,  DME instructions, and Re-evaluation  PLAN FOR NEXT SESSION: ASSESS STGs-missed last visit!, deficits from FGA, left trunk lean w/ gait, endurance   Bary Richard, PT, DPT 04/26/2022, 2:00 PM

## 2022-04-26 NOTE — Therapy (Deleted)
.  OPR

## 2022-04-26 NOTE — Patient Instructions (Signed)
Access Code: [email or web address] URL: [email or web address] Date: 04/26/2022 Prepared by: Elease Etienne  Exercises - Sit to Stand with Arms Crossed  - 2 x daily - 7 x weekly - 1 sets - 10 reps - Standing Near Stance in Corner with Eyes Closed  - 1 x daily - 5 x weekly - 1 sets - 3 reps - 30 seconds hold - Standing in corner with eyes closed  - 1 x daily - 5 x weekly - 1 sets - 10 reps - Tandem Walking with Counter Support  - 1 x daily - 5 x weekly - 1 sets - 3 reps - Toe Walking with Counter Support  - 1 x daily - 5 x weekly - 1 sets - 3 reps - Heel Walking with Counter Support  - 1 x daily - 5 x weekly - 1 sets - 3 reps - Backward Walking with Counter Support  - 1 x daily - 5 x weekly - 3 sets - 10 reps - Walking with Eyes Closed and Counter Support  - 1 x daily - 5 x weekly - 3 sets - 10 reps

## 2022-04-26 NOTE — Therapy (Signed)
OUTPATIENT OCCUPATIONAL THERAPY TREATMENT NOTE   Patient Name: Tony [name] MRN: [id number] DOB:1951-01-14, 71 y.o., male Today's Date: 04/26/2022  PCP: Kathalene Frames, MD REFERRING PROVIDER:  Darliss Cheney, MD  END OF SESSION:   OT End of Session - 04/26/22 1147     Visit Number 2    Number of Visits 9    Date for OT Re-Evaluation 05/25/22    Authorization Type [employer] - requires prior authorization    OT Start Time 1100    OT Stop Time 1144    OT Time Calculation (min) 44 min    Activity Tolerance Patient tolerated treatment well    Behavior During Therapy WFL for tasks assessed/performed             Past Medical History:  Diagnosis Date   Cancer ([place])    thyroid   CHF (congestive heart failure) ([place])    Complete heart block ([place])    a. 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 Burt Knack and Dr. Cyndia Bent   Severe aortic stenosis    Sleep apnea    Past Surgical History:  Procedure Laterality Date   CARDIAC CATHETERIZATION     INTRAOPERATIVE TRANSTHORACIC ECHOCARDIOGRAM N/A 08/29/2021   Procedure: INTRAOPERATIVE TRANSTHORACIC ECHOCARDIOGRAM;  Surgeon: [name], MD;  Location: [facility];  Service: Open Heart Surgery;  Laterality: N/A;   LUMBAR LAMINECTOMY/DECOMPRESSION MICRODISCECTOMY  07/19/2011   Procedure: LUMBAR LAMINECTOMY/DECOMPRESSION MICRODISCECTOMY 3 LEVELS;  Surgeon: Melina Schools, MD;  Location: [facility];  Service: Orthopedics;  Laterality: Left;  Lumbar three-Lumbar five LEFT DECOMPRESSION AND FORAMINOTOMY Lumbar three-four LEFT DISCECTOMY    PERMANENT PACEMAKER INSERTION N/A 08/11/2014   MDT Adapta L implanted by Dr [name] for CHB   RIGHT HEART CATH AND CORONARY ANGIOGRAPHY N/A 08/07/2021   Procedure: RIGHT HEART CATH AND CORONARY ANGIOGRAPHY;  Surgeon: [name], MD;  Location: [facility];  Service: Cardiovascular;  Laterality: N/A;   RIGHT/LEFT HEART CATH AND CORONARY ANGIOGRAPHY N/A 08/07/2021   Procedure: RIGHT/LEFT HEART CATH AND CORONARY ANGIOGRAPHY;  Surgeon: [name], MD;  Location: [facility];  Service: Cardiovascular;  Laterality: N/A;   Thyroidectomy x2     TRANSCATHETER AORTIC VALVE REPLACEMENT, TRANSFEMORAL N/A 08/29/2021   Procedure: Transcatheter Aortic Valve Replacement, Transfemoral;  Surgeon: [name], MD;  Location: [facility];  Service: Open Heart Surgery;  Laterality: N/A;   Patient Active Problem List   Diagnosis Date Noted   Acute stroke due to ischemia ([place]) 03/22/2022   Aortic valve disorder 09/07/2021   Diabetic renal disease ([place]) 09/07/2021   Fatty liver 09/07/2021   Gastroesophageal reflux disease 09/07/2021   Heart block 09/07/2021   Hypercholesterolemia 09/07/2021   Mixed hyperlipidemia 09/07/2021   Hyperglycemia due to type 2 diabetes mellitus ([place]) 09/07/2021   Long term (current) use of insulin ([place]) 09/07/2021   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 associated with type 2 diabetes mellitus ([place]) 09/07/2021   Mitral regurgitation 09/07/2021   Moderate recurrent major depression ([place]) 09/07/2021  Obesity 09/07/2021   Severe recurrent major depression without psychotic features ([place]) 09/07/2021   S/P TAVR (transcatheter aortic valve replacement) 78/93/8101   Chronic systolic CHF (congestive heart failure) ([place]) 11/04/2019   Pacemaker 11/04/2019   Ganglion cyst 12/19/2015   Severe aortic stenosis 09/01/2014   Essential hypertension 09/01/2014   Type 2 diabetes mellitus with peripheral neuropathy ([place])  09/01/2014   Hypothyroidism 09/01/2014   OSA on CPAP 09/01/2014   Lumbar stenosis 07/16/2011   HNP (herniated nucleus pulposus), lumbar 07/16/2011    ONSET DATE:   03/22/2022  REFERRING DIAG: I63.9 (ICD-10-CM) - Cerebral infarction, unspecified mechanism  THERAPY DIAG:  Unsteadiness on feet  Muscle weakness (generalized)  Hemiplegia and hemiparesis following cerebral infarction affecting left non-dominant side (HCC)  Other lack of coordination  Visuospatial deficit  Rationale for Evaluation and Treatment: Rehabilitation  PERTINENT HISTORY: [name], "KOLBEY TEICHERT is a 71 y.o. male who presents for new onset of facial droop, numbness.  Per patient, he was at home, had just eaten an ice cream sandwich, when he felt tingling in the right side of his face, heaviness of the left hand and had a facial droop per family.  His daughter is an anesthesiologist and his son is a paramedic, they responded quickly and he was brought to the ED. CT head showed mild chronic changes.  CTA of the head and neck showed some moderate intracranial atherosclerosis.  Labs showed an elevated Cr to 1.48, Glucose of 169.  Baseline Cr appears to be around 1.1 - 1.2.  T bili of 1.8, GFR of 50, H/H 15 and 45.  INR 1.0."   PRECAUTIONS: Fall and ICD/Pacemaker  SUBJECTIVE: Last week I missed an appointment.  I had lost my phone, and I was so lost without it!  I really don't have any numbness anymore.    PAIN:  Are you having pain? No   OBJECTIVE:   TODAY'S TREATMENT:                                                                                                                              DATE: 04/26/22  Reviewed OT goals.  Patient reports right eye blindness well before this stroke.  Patient reports numbness has resolved.  Denies difficulty with ADL/IADL.  Does report deconditioning and decreased balance even prior to stroke.  Recent h/o 5 falls (prior to stroke)  Patient with flat affect, yet  denies feeling depressed.   Patient gestures with right hand during conversation, while left hand remains stable at side.  Patient unaware.   Demonstrated and practiced green putty exercises to increase hand strength as per goal.   Anticipate patient may not need all 8 visits due to significant improvements noted.     Education:   Grip Strengthening (Resistive Putty)   Squeeze putty using thumb and all fingers. Repeat 15 times. Do 1-2 sessions per day.   Extension (Assistive Putty)   Roll putty back  and forth, being sure to use all fingertips. Repeat 3 times. Do 1-2 sessions per day.  Then pinch as below.        FINGERS: Intrinsic (Putty)    Flatten putty by separating fingers. Keep fingers straight.          GOALS:     SHORT TERM GOALS: Target date: 04/26/2022     Patient will demonstrate initial HEP with 25% verbal cues or less for proper execution. Baseline: Goal status: ONGOING   2.  Pt will verbalize understanding of vision strategies to improve safety with functional mobility. Baseline:  Goal status: MET     LONG TERM GOALS: Target date: 05/25/2022     Patient will be independent with updated BUE HEP. Baseline:  Goal status: ONGOING   2.  Pt will demonstrate at least 75 lbs L grip strength.  Baseline: 65 lbs Goal status: ONGOING   3.  Pt will improve 9 hole peg test score on R and L by at least 5 seconds. Baseline: Right: 35 sec; Left: 39 sec Goal status: ONGOING   4.  Pt will demonstrate good safety while completing functional standing task(s) for at least 15 minutes without rest break. Baseline:  Goal status: ONGOING  ASSESSMENT:   CLINICAL IMPRESSION: Patient reports losing his phone for a day last week thus missing schedule, contacts, etc.  Patient feels stroke symptoms resolving.    PERFORMANCE DEFICITS: in functional skills including ADLs, IADLs, coordination, proprioception, strength, pain, Fine motor control, mobility, balance,  body mechanics, endurance, cardiopulmonary status limiting function, vision, and UE functional use, cognitive skills including attention, memory, safety awareness, thought, and may be age appropriate but will continue to monitor , and psychosocial skills including routines and behaviors.    IMPAIRMENTS: are limiting patient from ADLs, IADLs, and leisure.    CO-MORBIDITIES: may have co-morbidities  that affects occupational performance. Patient will benefit from skilled OT to address above impairments and improve overall function.   MODIFICATION OR ASSISTANCE TO COMPLETE EVALUATION: No modification of tasks or assist necessary to complete an evaluation.   OT OCCUPATIONAL PROFILE AND HISTORY: Detailed assessment: Review of records and additional review of physical, cognitive, psychosocial history related to current functional performance.   CLINICAL DECISION MAKING: LOW - limited treatment options, no task modification necessary   REHAB POTENTIAL: Good     PLAN:   OT FREQUENCY: 1x/week   OT DURATION: 8 weeks   PLANNED INTERVENTIONS: self care/ADL training, therapeutic exercise, therapeutic activity, neuromuscular re-education, manual therapy, gait training, balance training, stair training, functional mobility training, ultrasound, paraffin, fluidotherapy, moist heat, patient/family education, cognitive remediation/compensation, visual/perceptual remediation/compensation, psychosocial skills training, energy conservation, coping strategies training, DME and/or AE instructions, and Re-evaluation   RECOMMENDED OTHER SERVICES: Will continue to monitor cognition. He may benefit from ST eval.    CONSULTED AND AGREED WITH PLAN OF CARE: Patient   PLAN FOR NEXT SESSION: Dynamic stand balance, review putty, ask how he did at the [facility], Conditioning exercise  Mariah Milling, OT 04/26/2022, 11:50 AM

## 2022-04-26 NOTE — Patient Instructions (Signed)
Grip Strengthening (Resistive Putty)   Squeeze putty using thumb and all fingers. Repeat 15 times. Do 1-2 sessions per day.   Extension (Assistive Putty)   Roll putty back and forth, being sure to use all fingertips. Repeat 3 times. Do 1-2 sessions per day.  Then pinch as below.        FINGERS: Intrinsic (Putty)    Flatten putty by separating fingers. Keep fingers straight.

## 2022-05-03 ENCOUNTER — Ambulatory Visit: Payer: Medicare PPO | Admitting: Occupational Therapy

## 2022-05-03 ENCOUNTER — Ambulatory Visit: Payer: Medicare PPO | Attending: Internal Medicine | Admitting: Physical Therapy

## 2022-05-03 ENCOUNTER — Encounter: Payer: Self-pay | Admitting: Occupational Therapy

## 2022-05-03 ENCOUNTER — Encounter: Payer: Self-pay | Admitting: Physical Therapy

## 2022-05-03 DIAGNOSIS — M6281 Muscle weakness (generalized): Secondary | ICD-10-CM | POA: Diagnosis present

## 2022-05-03 DIAGNOSIS — R2689 Other abnormalities of gait and mobility: Secondary | ICD-10-CM | POA: Insufficient documentation

## 2022-05-03 DIAGNOSIS — R2681 Unsteadiness on feet: Secondary | ICD-10-CM | POA: Insufficient documentation

## 2022-05-03 DIAGNOSIS — I69354 Hemiplegia and hemiparesis following cerebral infarction affecting left non-dominant side: Secondary | ICD-10-CM | POA: Diagnosis present

## 2022-05-03 DIAGNOSIS — R41842 Visuospatial deficit: Secondary | ICD-10-CM | POA: Diagnosis present

## 2022-05-03 DIAGNOSIS — R278 Other lack of coordination: Secondary | ICD-10-CM

## 2022-05-03 NOTE — Therapy (Signed)
OUTPATIENT OCCUPATIONAL THERAPY TREATMENT NOTE   Patient Name: Tony [name] MRN: [id number] DOB:20-Aug-1950, 72 y.o., male Today's Date: 05/03/2022  PCP: Kathalene Frames, MD REFERRING PROVIDER:  Darliss Cheney, MD  END OF SESSION:   OT End of Session - 05/03/22 1026     Visit Number 3    Number of Visits 9    Date for OT Re-Evaluation 05/25/22    Authorization Type [employer] - requires prior authorization    OT Start Time 63    OT Stop Time 1100    OT Time Calculation (min) 40 min    Activity Tolerance Patient tolerated treatment well    Behavior During Therapy WFL for tasks assessed/performed             Past Medical History:  Diagnosis Date   Cancer ([place])    thyroid   CHF (congestive heart failure) ([place])    Complete heart block ([place])    a. 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 Burt Knack and Dr. Cyndia Bent   Severe aortic stenosis    Sleep apnea    Past Surgical History:  Procedure Laterality Date   CARDIAC CATHETERIZATION     INTRAOPERATIVE TRANSTHORACIC ECHOCARDIOGRAM N/A 08/29/2021   Procedure: INTRAOPERATIVE TRANSTHORACIC ECHOCARDIOGRAM;  Surgeon: [name], MD;  Location: [facility];  Service: Open Heart Surgery;  Laterality: N/A;   LUMBAR LAMINECTOMY/DECOMPRESSION MICRODISCECTOMY  07/19/2011   Procedure: LUMBAR LAMINECTOMY/DECOMPRESSION MICRODISCECTOMY 3 LEVELS;  Surgeon: Melina Schools, MD;  Location: [facility];  Service: Orthopedics;  Laterality: Left;  Lumbar three-Lumbar five LEFT DECOMPRESSION AND FORAMINOTOMY Lumbar three-four LEFT DISCECTOMY   PERMANENT  PACEMAKER INSERTION N/A 08/11/2014   MDT Adapta L implanted by Dr [name] for CHB   RIGHT HEART CATH AND CORONARY ANGIOGRAPHY N/A 08/07/2021   Procedure: RIGHT HEART CATH AND CORONARY ANGIOGRAPHY;  Surgeon: [name], MD;  Location: [facility];  Service: Cardiovascular;  Laterality: N/A;   RIGHT/LEFT HEART CATH AND CORONARY ANGIOGRAPHY N/A 08/07/2021   Procedure: RIGHT/LEFT HEART CATH AND CORONARY ANGIOGRAPHY;  Surgeon: [name], MD;  Location: [facility];  Service: Cardiovascular;  Laterality: N/A;   Thyroidectomy x2     TRANSCATHETER AORTIC VALVE REPLACEMENT, TRANSFEMORAL N/A 08/29/2021   Procedure: Transcatheter Aortic Valve Replacement, Transfemoral;  Surgeon: [name], MD;  Location: [facility];  Service: Open Heart Surgery;  Laterality: N/A;   Patient Active Problem List   Diagnosis Date Noted   Acute stroke due to ischemia ([place]) 03/22/2022   Aortic valve disorder 09/07/2021   Diabetic renal disease ([place]) 09/07/2021   Fatty liver 09/07/2021   Gastroesophageal reflux disease 09/07/2021   Heart block 09/07/2021   Hypercholesterolemia 09/07/2021   Mixed hyperlipidemia 09/07/2021   Hyperglycemia due to type 2 diabetes mellitus (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) 09/07/2021   S/P TAVR (transcatheter aortic valve replacement) 00/92/3300   Chronic systolic CHF (congestive heart failure) ([place]) 11/04/2019   Pacemaker 11/04/2019   Ganglion cyst 12/19/2015   Severe aortic stenosis 09/01/2014   Essential hypertension 09/01/2014   Type 2 diabetes mellitus with peripheral neuropathy ([place]) 09/01/2014    Hypothyroidism 09/01/2014   OSA on CPAP 09/01/2014   Lumbar stenosis 07/16/2011   HNP (herniated nucleus pulposus), lumbar 07/16/2011    ONSET DATE:   03/22/2022  REFERRING DIAG: I63.9 (ICD-10-CM) - Cerebral infarction, unspecified mechanism  THERAPY DIAG:  Hemiplegia and hemiparesis following cerebral infarction affecting left non-dominant side (HCC)  Muscle weakness (generalized)  Unsteadiness on feet  Other lack of coordination  Rationale for Evaluation and Treatment: Rehabilitation  PERTINENT HISTORY: [name], "PHILOPATER MUCHA is a 72 y.o. male who presents for new onset of facial droop, numbness.  Per patient, he was at home, had just eaten an ice cream sandwich, when he felt tingling in the right side of his face, heaviness of the left hand and had a facial droop per family.  His daughter is an anesthesiologist and his son is a paramedic, they responded quickly and he was brought to the ED. CT head showed mild chronic changes.  CTA of the head and neck showed some moderate intracranial atherosclerosis.  Labs showed an elevated Cr to 1.48, Glucose of 169.  Baseline Cr appears to be around 1.1 - 1.2.  T bili of 1.8, GFR of 50, H/H 15 and 45.  INR 1.0.", lumbar laminectomy/decompression 07/19/2011   PRECAUTIONS: Fall and Pacemaker  SUBJECTIVE: My back has been hurting for about 4 days. It does this about every 6-8 months  PAIN:  Are you having pain? No   OBJECTIVE:   TODAY'S TREATMENT:                                                                                                                               Pt reports he only went to the [facility] once since last session as pt's back flared up the next day. Pt has been wearing soft lumbar brace since then.  Discussed gentle core strengthening and possibly taking Pilates class at [facility] w/ modifications prn.   Verbally reviewed putty HEP (see below) - pt did not bring putty today but had no further  questions  Dynamic standing tasks: Pt walking forwards, backwards, and side stepping along counter. Standing with weight shifts for contralateral and ipsilateral reaching and to retrieve objects from shelf and replace - pt closely monitored for back pain. Pt reports no problems  Pt shown core strengthening ex's quadraped including: cat/cow stretch and leg lifts - pt return demo w/ no back issues. Pt instructed NOT to do at home unless modified or if someone was with him to help him get in and out of position. Therapist showed pt modifications  to ex's Pt also shown ex to do seated to engage core including alternating UE/LE lift.    Education:   Grip Strengthening (Resistive Putty)   Squeeze putty using thumb and all fingers. Repeat 15 times. Do 1-2 sessions per day.   Extension (Assistive Putty)   Roll putty back and forth, being sure to use all fingertips. Repeat 3 times. Do 1-2 sessions per day.  Then pinch as below.        FINGERS: Intrinsic (Putty)    Flatten putty by separating fingers. Keep fingers straight.          GOALS:     SHORT TERM GOALS: Target date: 04/26/2022     Patient will demonstrate initial HEP with 25% verbal cues or less for proper execution. Baseline: Goal status: MET   2.  Pt will verbalize understanding of vision strategies to improve safety with functional mobility. Baseline:  Goal status: MET     LONG TERM GOALS: Target date: 05/25/2022     Patient will be independent with updated BUE HEP. Baseline:  Goal status: ONGOING   2.  Pt will demonstrate at least 75 lbs L grip strength.  Baseline: 65 lbs Goal status: ONGOING   3.  Pt will improve 9 hole peg test score on R and L by at least 5 seconds. Baseline: Right: 35 sec; Left: 39 sec Goal status: ONGOING   4.  Pt will demonstrate good safety while completing functional standing task(s) for at least 15 minutes without rest break. Baseline:  Goal status:  ONGOING  ASSESSMENT:   CLINICAL IMPRESSION: Pt limited the last several days due to back pain and leg weakness which he reports typically happens every 6-8 months since laminectomy. Going to [facility] may have exacerbated however.  PERFORMANCE DEFICITS: in functional skills including ADLs, IADLs, coordination, proprioception, strength, pain, Fine motor control, mobility, balance, body mechanics, endurance, cardiopulmonary status limiting function, vision, and UE functional use, cognitive skills including attention, memory, safety awareness, thought, and may be age appropriate but will continue to monitor , and psychosocial skills including routines and behaviors.    IMPAIRMENTS: are limiting patient from ADLs, IADLs, and leisure.    CO-MORBIDITIES: may have co-morbidities  that affects occupational performance. Patient will benefit from skilled OT to address above impairments and improve overall function.   MODIFICATION OR ASSISTANCE TO COMPLETE EVALUATION: No modification of tasks or assist necessary to complete an evaluation.   OT OCCUPATIONAL PROFILE AND HISTORY: Detailed assessment: Review of records and additional review of physical, cognitive, psychosocial history related to current functional performance.   CLINICAL DECISION MAKING: LOW - limited treatment options, no task modification necessary   REHAB POTENTIAL: Good     PLAN:   OT FREQUENCY: 1x/week   OT DURATION: 8 weeks   PLANNED INTERVENTIONS: self care/ADL training, therapeutic exercise, therapeutic activity, neuromuscular re-education, manual therapy, gait training, balance training, stair training, functional mobility training, ultrasound, paraffin, fluidotherapy, moist heat, patient/family education, cognitive remediation/compensation, visual/perceptual remediation/compensation, psychosocial skills training, energy conservation, coping strategies training, DME and/or AE instructions, and Re-evaluation   RECOMMENDED OTHER  SERVICES: Will continue to monitor cognition. He may benefit from ST eval.    CONSULTED AND AGREED WITH PLAN OF CARE: Patient   PLAN FOR NEXT SESSION: Dynamic stand balance, core/trunk strengthening, progress towards LTG's   Hans Eden, OT 05/03/2022, 10:27 AM

## 2022-05-03 NOTE — Therapy (Signed)
OUTPATIENT PHYSICAL THERAPY NEURO TREATMENT   Patient Name: Tony [name] MRN: [id number] DOB:1950-05-19, 72 y.o., male Today's Date: 05/03/2022   PCP: Kathalene Frames, MD REFERRING PROVIDER: Darliss Cheney, MD  END OF SESSION:  PT End of Session - 05/03/22 1155     Visit Number 4    Number of Visits 9   8+eval   Date for PT Re-Evaluation 06/01/22   pushed out due to potential delay in PT availability   Authorization Type [employer]ICARE    Progress Note Due on Visit 10    PT Start Time 1150   PT ran over w/ pt prior   PT Stop Time 22    PT Time Calculation (min) 40 min    Equipment Utilized During Treatment Gait belt    Activity Tolerance Patient tolerated treatment well    Behavior During Therapy Restless             Past Medical History:  Diagnosis Date   Cancer ([place])    thyroid   CHF (congestive heart failure) ([place])    Complete heart block ([place])    a. 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 Burt Knack and Dr. Cyndia Bent   Severe aortic stenosis    Sleep apnea    Past Surgical History:  Procedure Laterality Date   CARDIAC CATHETERIZATION     INTRAOPERATIVE TRANSTHORACIC ECHOCARDIOGRAM N/A 08/29/2021   Procedure: INTRAOPERATIVE TRANSTHORACIC ECHOCARDIOGRAM;  Surgeon: [name], MD;  Location: [facility];  Service: Open Heart Surgery;  Laterality: N/A;   LUMBAR LAMINECTOMY/DECOMPRESSION MICRODISCECTOMY  07/19/2011   Procedure: LUMBAR LAMINECTOMY/DECOMPRESSION MICRODISCECTOMY 3 LEVELS;  Surgeon: Melina Schools, MD;  Location: [facility];  Service: Orthopedics;  Laterality:  Left;  Lumbar three-Lumbar five LEFT DECOMPRESSION AND FORAMINOTOMY Lumbar three-four LEFT DISCECTOMY   PERMANENT PACEMAKER INSERTION N/A 08/11/2014   MDT Adapta L implanted by Dr [name] for CHB   RIGHT HEART CATH AND CORONARY ANGIOGRAPHY N/A 08/07/2021   Procedure: RIGHT HEART CATH AND CORONARY ANGIOGRAPHY;  Surgeon: [name], MD;  Location: [facility];  Service: Cardiovascular;  Laterality: N/A;   RIGHT/LEFT HEART CATH AND CORONARY ANGIOGRAPHY N/A 08/07/2021   Procedure: RIGHT/LEFT HEART CATH AND CORONARY ANGIOGRAPHY;  Surgeon: [name], MD;  Location: [facility];  Service: Cardiovascular;  Laterality: N/A;   Thyroidectomy x2     TRANSCATHETER AORTIC VALVE REPLACEMENT, TRANSFEMORAL N/A 08/29/2021   Procedure: Transcatheter Aortic Valve Replacement, Transfemoral;  Surgeon: [name], MD;  Location: [facility];  Service: Open Heart Surgery;  Laterality: N/A;   Patient Active Problem List   Diagnosis Date Noted   Acute stroke due to ischemia ([place]) 03/22/2022   Aortic valve disorder 09/07/2021   Diabetic renal disease ([place]) 09/07/2021   Fatty liver 09/07/2021   Gastroesophageal reflux disease 09/07/2021   Heart block 09/07/2021   Hypercholesterolemia 09/07/2021   Mixed hyperlipidemia 09/07/2021   Hyperglycemia due to type 2 diabetes mellitus ([place]) 09/07/2021   Long term (current) use of insulin ([place]) 09/07/2021   Mild nonproliferative diabetic  retinopathy of right eye without macular edema associated with type 2 diabetes mellitus ([place]) 09/07/2021   Mitral regurgitation 09/07/2021   Moderate recurrent major depression ([place]) 09/07/2021   Obesity 09/07/2021   Severe recurrent major depression without psychotic features ([place]) 09/07/2021   S/P TAVR (transcatheter aortic valve replacement) 02/72/5366   Chronic systolic CHF (congestive heart failure) ([place]) 11/04/2019   Pacemaker 11/04/2019   Ganglion cyst 12/19/2015   Severe aortic stenosis  09/01/2014   Essential hypertension 09/01/2014   Type 2 diabetes mellitus with peripheral neuropathy ([place]) 09/01/2014   Hypothyroidism 09/01/2014   OSA on CPAP 09/01/2014   Lumbar stenosis 07/16/2011   HNP (herniated nucleus pulposus), lumbar 07/16/2011    ONSET DATE: 03/23/2022 (referral)  REFERRING DIAG: I63.9 (ICD-10-CM) - Cerebral infarction, unspecified mechanism ([place])  THERAPY DIAG:  Unsteadiness on feet  Muscle weakness (generalized)  Hemiplegia and hemiparesis following cerebral infarction affecting left non-dominant side (H[facility])  Other lack of coordination  Other abnormalities of gait and mobility  Rationale for Evaluation and Treatment: Rehabilitation  SUBJECTIVE:                                                                                                                                                                                             SUBJECTIVE STATEMENT: Pt states his low back is feeling better following activity with OT prior.  He is wearing a sacral stability belt this session-[employer].  He denies falls. Pt accompanied by: self  PERTINENT HISTORY: Lumbar stenosis, aortic stenosis, HTN, DM II, pacemaker, chronic systolic CHF, GERD, hyperlipidemia, major depression  Pt presented to the ED 03/22/2022 w/ new onset of tingling in the left side of his face, heaviness of the left hand and had a facial droop per family.  "He has had chronic issues with lightheadedness, low blood pressures and disequilibrium since having his TAVR procedure.  He has been in rehab and has started getting strength back. He has chronic right sided blindness, due to previous CVA per report from family.  This was about 10 years ago."  PAIN:  Are you having pain? 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  Pt states it has been sporadic due to flare ups of back pain. -ABC scale:  67.5%  -5xSTS no UE support:  20.31 sec  -6MWT no AD:  718'; pt has persistent and significant forward and left lateral trunk lean w/ decreased arm swing bilaterally and decreased RLE stance, pt consistently denies rest break when recommended by PT due to notable worsening of R toe catch and gait deficits, pt redirected to task to conserve energy and promote safety w/ continued task vs turning head over right shoulder to speak to therapist.  Final significant forward LOB due to right toe catch at end of distance requiring modA and several quick steps by patient to prevent fall.  Test ended w/ therapist instructing patient to rest.  Pt reports left hip fatigue following task.  -FGA:  [facility] PT Assessment - 05/03/22 1221       Functional Gait  Assessment   Gait assessed  Yes    Gait Level Surface Walks 20 ft in less than 7 sec but greater than 5.5 sec, uses assistive device, slower speed, mild gait deviations, or deviates 6-10 in outside of the 12 in walkway width.    Change in Gait Speed Able to change speed, demonstrates mild gait deviations, deviates 6-10 in outside of the 12 in walkway width, or no gait deviations, unable to achieve a major change in velocity, or uses a change in velocity, or uses an assistive device.    Gait with Horizontal Head Turns Performs head turns smoothly with slight change in gait velocity (eg, minor disruption to smooth gait path), deviates 6-10 in outside 12 in walkway width, or uses an assistive device.    Gait with Vertical Head Turns  Performs task with slight change in gait velocity (eg, minor disruption to smooth gait path), deviates 6 - 10 in outside 12 in walkway width or uses assistive device    Gait and Pivot Turn Pivot turns safely in greater than 3 sec and stops with no loss of balance, or pivot turns safely within 3 sec and stops with mild imbalance, requires small steps to catch balance.    Step Over Obstacle Is able to step over one shoe box (4.5 in total height) without changing gait speed. No evidence of imbalance.    Gait with Narrow Base of Support Ambulates 7-9 steps.   8 steps   Gait with Eyes Closed Walks 20 ft, slow speed, abnormal gait pattern, evidence for imbalance, deviates 10-15 in outside 12 in walkway width. Requires more than 9 sec to ambulate 20 ft.    Ambulating Backwards Walks 20 ft, slow speed, abnormal gait pattern, evidence for imbalance, deviates 10-15 in outside 12 in walkway width.    Steps Alternating feet, must use rail.    Total Score 18    FGA comment: 18/30 = high fall risk            PATIENT EDUCATION: Education details: Progress towards goals.  Safety w/ ambulation and necessity of rest to prevent fall due to fatigue.  Do not carry things in both hands when going up stairs.  Generally trying to walk w/ shoulder width BOS to prevent crossover especially when pt is distracted by people, noises, etc. Person educated: Patient Education method: Explanation Education comprehension: verbalized understanding  HOME EXERCISE PROGRAM:  Access Code: [email or web address] URL: [email or web address] Date: 04/26/2022 Prepared by: Elease Etienne  Exercises - Sit to Stand with Arms Crossed  - 2 x daily -  7 x weekly - 1 sets - 10 reps - Standing Near Stance in Corner with Eyes Closed  - 1 x daily - 5 x weekly - 1 sets - 3 reps - 30 seconds hold - Standing in corner with eyes closed  - 1 x daily - 5 x weekly - 1 sets - 10 reps - Tandem Walking with Counter Support  - 1 x daily - 5 x  weekly - 1 sets - 3 reps - Toe Walking with Counter Support  - 1 x daily - 5 x weekly - 1 sets - 3 reps - Heel Walking with Counter Support  - 1 x daily - 5 x weekly - 1 sets - 3 reps - Backward Walking with Counter Support  - 1 x daily - 5 x weekly - 3 sets - 10 reps - Walking with Eyes Closed and Counter Support  - 1 x daily - 5 x weekly - 3 sets - 10 reps  You Can Walk For A Certain Length Of Time Each Day                          Walk 10 minutes 1 times per day.             Increase 2-5  minutes every 7 days              Work up to 20 minutes (1-2 times per day).               Example:                         Day 1-2           4-5 minutes     3 times per day                         Day 7-8           10-12 minutes 2-3 times per day                         Day 13-14       20-22 minutes 1-2 times per day  GOALS: Goals reviewed with patient? Yes  SHORT TERM GOALS: Target date: 04/27/2022  Pt will be independent with strength and balance HEP to maintain gains at home. Baseline:  Pt is intermittently compliant. Goal status: IN PROGRESS  2.  Pt will improve balance confidence to >70% on ABC scale to demonstrate decreased fear of falling. Baseline:  62.5% (12/15); 67.5% (1/4) Goal status: IN PROGRESS  3.  Pt will decrease 5xSTS to </=13 seconds in order to demonstrate decreased risk for falls and improved functional bilateral LE strength and power. Baseline: 14.84 sec no UE support; 20.31 sec no UE support Goal status: NOT MET  4.  Pt will ambulate >1200 feet on 6MWT w/o dyspnea or increased left trunk lean to demonstrate improved functional endurance for home and community participation. Baseline:  1205' w/ inc left trunk lean and dyspnea (12/15); 718' w/ continued fatigue and deficits from prior (1/4) Goal status: NOT MET  5.  Pt will improve FGA score to >/=22/30 in order to demonstrate improved balance and decreased fall risk. Baseline: 17/30 (12/15); 18/30 (1/4) Goal status:  IN PROGRESS  LONG TERM GOALS: Target date: 05/25/2022  Pt will be compliant to walking program no less  than 3 days per week to promote aerobic endurance. Baseline: Currently pt reports he is fairly inactive. Goal status: INITIAL  2.  Pt will improve balance confidence to >80% on ABC scale to demonstrate decreased fear of falling. Baseline: 62.5% (12/15) Goal status: INITIAL  3.  Pt will ambulate >/=1400 feet on 6MWT to demonstrate improved functional endurance for home and community participation. Baseline: 1205' (12/15) Goal status: INITIAL  4.  Pt will improve FGA score to >/=27/30 in order to demonstrate improved balance and decreased fall risk. Baseline: 17/30 (12/15) Goal status: INITIAL  ASSESSMENT:  CLINICAL IMPRESSION: STGs assessed this session with pt making modest progress.  His FGA score remains essentially unchanged with a single point increase to 18/30 still indicating a high fall risk.  His ABC scale score improved to 67.5% w/ pt reporting he is feeling more confident the further he is from his stroke.  His 5xSTS time significantly increased this session, but pt was endorsing low back pain this visit.  During the 6MWT pt ambulates 718' prior to major LOB due to right toe catch and worsening mechanics with fatigue.  Gait deficits like severe forward trunk lean, uncontrolled forward momentum, and toe walking w/ increased speed continue to put him at elevated risk for falls.  Despite education on these deficits this session, pt feels he has not noticed any real lasting stroke deficits.  Will continue to educate and address fall risk and safety with functional mobility per ongoing skilled PT POC.      OBJECTIVE IMPAIRMENTS: Abnormal gait, decreased activity tolerance, decreased balance, decreased endurance, decreased strength, improper body mechanics, and postural dysfunction.   ACTIVITY LIMITATIONS: locomotion level  PARTICIPATION LIMITATIONS: community activity  PERSONAL  FACTORS: Age, Behavior pattern, Fitness, Past/current experiences, and 1-2 comorbidities: HTN, lumbar stenosis  are also affecting patient's functional outcome.   REHAB POTENTIAL: Excellent  CLINICAL DECISION MAKING: Stable/uncomplicated  EVALUATION COMPLEXITY: Low  PLAN:  PT FREQUENCY: 1x/week  PT DURATION: 8 weeks  PLANNED INTERVENTIONS: Therapeutic exercises, Therapeutic activity, Neuromuscular re-education, Balance training, Gait training, Patient/Family education, Self Care, Joint mobilization, Stair training, Vestibular training, DME instructions, and Re-evaluation  PLAN FOR NEXT SESSION:  deficits from FGA, left trunk lean and widen BOS w/ gait, endurance, upright posture during standing and dynamic tasks   Bary Richard, PT, DPT 05/03/2022, 1:08 PM

## 2022-05-09 ENCOUNTER — Ambulatory Visit: Payer: Medicare PPO | Admitting: Occupational Therapy

## 2022-05-09 ENCOUNTER — Ambulatory Visit: Payer: Medicare PPO | Admitting: Physical Therapy

## 2022-05-09 DIAGNOSIS — I69354 Hemiplegia and hemiparesis following cerebral infarction affecting left non-dominant side: Secondary | ICD-10-CM

## 2022-05-09 DIAGNOSIS — R2689 Other abnormalities of gait and mobility: Secondary | ICD-10-CM

## 2022-05-09 DIAGNOSIS — R2681 Unsteadiness on feet: Secondary | ICD-10-CM

## 2022-05-09 DIAGNOSIS — R278 Other lack of coordination: Secondary | ICD-10-CM

## 2022-05-09 DIAGNOSIS — M6281 Muscle weakness (generalized): Secondary | ICD-10-CM

## 2022-05-09 NOTE — Therapy (Signed)
[facility] [address] [place], [place], [address] Phone: [phone]   Fax:  [phone]  Patient Details - ARRIVED NO CHARGE Name: Tony [name] MRN: [id number] Date of Birth: 1951-03-11 Referring Provider:  Kathalene Frames, *  Encounter Date: 05/09/2022  Pt stating he is in too much back pain to do therapy.  This pain is not new, he endorses it is intermittent since before his back surgery and that it will go away.  Pain evident by difficulty standing from standard chair in lobby and needing to lean against wall or counter for relief.  Edu on trialing heat and light stretching and movement within reason to see if this relieves pain.  Pt verbalizes understanding.  PT has pt reschedule PT appt with front desk.  Pt informed about rescheduling OT as well, due to difficulties rescheduling, he opted to cancel OT appt without rescheduling.  Bary Richard, PT, DPT 05/09/2022, [facility] [address] [place], [place], [address] Phone: [phone]   Fax:  [phone]

## 2022-05-16 ENCOUNTER — Ambulatory Visit: Payer: Medicare PPO | Admitting: Occupational Therapy

## 2022-05-16 ENCOUNTER — Encounter: Payer: Self-pay | Admitting: Physical Therapy

## 2022-05-16 ENCOUNTER — Ambulatory Visit: Payer: Medicare PPO | Admitting: Physical Therapy

## 2022-05-16 ENCOUNTER — Encounter: Payer: Self-pay | Admitting: Occupational Therapy

## 2022-05-16 DIAGNOSIS — R41842 Visuospatial deficit: Secondary | ICD-10-CM

## 2022-05-16 DIAGNOSIS — I69354 Hemiplegia and hemiparesis following cerebral infarction affecting left non-dominant side: Secondary | ICD-10-CM

## 2022-05-16 DIAGNOSIS — R278 Other lack of coordination: Secondary | ICD-10-CM

## 2022-05-16 DIAGNOSIS — M6281 Muscle weakness (generalized): Secondary | ICD-10-CM

## 2022-05-16 DIAGNOSIS — R2689 Other abnormalities of gait and mobility: Secondary | ICD-10-CM

## 2022-05-16 DIAGNOSIS — R2681 Unsteadiness on feet: Secondary | ICD-10-CM | POA: Diagnosis not present

## 2022-05-16 NOTE — Patient Instructions (Addendum)
   Upper Body Strengthening Exercises  Comments  Sit upright, away from the back of your chair. Keep abdominal muscles engaged i.e. pull belly button to spine.  Repeat 10 Times  Hold 3 Seconds  Complete 1 Set  Perform 2 Times a Day   FOR ALL EXERCISES:   ELASTIC BAND FLEXION   Place unaffected arm on your leg or hip. With your affected arm, hold elastic band in front of you and pull the band upward towards the ceiling as shown.       ELASTIC BAND HORIZONTAL ABDUCTION - SCAPULAR RETRACTION  Start by holding elastic band in front of your chest with your elbows straight. Then, pull your arms apart and towards the side while squeezing your shoulder blades together. Return to starting position and repeat.            ELASTIC BAND TRICEPS EXTENSION  While seated, hold and fixate one end of an elastic band against your chest. Hold the other end with your opposite hand with your elbow bent and arm by your side.   Start by pulling the band downward so that the elbow goes from a bent position to a straightened position as shown. Return to starting position and repeat.                    BICEPS CURL WITH BAND  While sitting in an upright position, put an elastic band underneath your feet (as pictured)  OR underneath your thighs. Grip each end of the band, keep the elbows tucked to the body, and bring hands to shoulders by bending at the elbows.

## 2022-05-16 NOTE — Therapy (Signed)
OUTPATIENT OCCUPATIONAL THERAPY TREATMENT NOTE   Patient Name: Tony [name] MRN: [id number] DOB:1951/02/01, 72 y.o., male Today's Date: 05/16/2022  PCP: Tony Frames, MD REFERRING PROVIDER:  Darliss Cheney, MD  END OF SESSION:   OT End of Session - 05/16/22 1145     Visit Number 4    Number of Visits 9    Date for OT Re-Evaluation 05/25/22    Authorization Type [employer] - requires prior authorization    Progress Note Due on Visit 10    OT Start Time 1146    OT Stop Time 1224    OT Time Calculation (min) 38 min    Activity Tolerance Patient tolerated treatment well    Behavior During Therapy WFL for tasks assessed/performed             Past Medical History:  Diagnosis Date   Cancer ([place])    thyroid   CHF (congestive heart failure) ([place])    Complete heart block ([place])    a. 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 Burt [name] and Dr. Cyndia [name]   Severe aortic stenosis    Sleep apnea    Past Surgical History:  Procedure Laterality Date   CARDIAC CATHETERIZATION     INTRAOPERATIVE TRANSTHORACIC ECHOCARDIOGRAM N/A 08/29/2021   Procedure: INTRAOPERATIVE TRANSTHORACIC ECHOCARDIOGRAM;  Surgeon: Tony Mocha, MD;  [facility]: [name];  Service: Open Heart Surgery;  Laterality: N/A;   LUMBAR LAMINECTOMY/DECOMPRESSION MICRODISCECTOMY  07/19/2011   Procedure: LUMBAR LAMINECTOMY/DECOMPRESSION MICRODISCECTOMY 3 LEVELS;  Surgeon: Tony Schools, MD;  Location: [facility];  Service: Orthopedics;  Laterality: Left;  Lumbar three-Lumbar five LEFT DECOMPRESSION AND FORAMINOTOMY Lumbar  three-four LEFT DISCECTOMY   PERMANENT PACEMAKER INSERTION N/A 08/11/2014   MDT Adapta L implanted by Dr Tony [name] for CHB   RIGHT HEART CATH AND CORONARY ANGIOGRAPHY N/A 08/07/2021   Procedure: RIGHT HEART CATH AND CORONARY ANGIOGRAPHY;  Surgeon: Tony Mocha, MD;  Location: [facility];  Service: Cardiovascular;  Laterality: N/A;   RIGHT/LEFT HEART CATH AND CORONARY ANGIOGRAPHY N/A 08/07/2021   Procedure: RIGHT/LEFT HEART CATH AND CORONARY ANGIOGRAPHY;  Surgeon: Tony Mocha, MD;  Location: [facility];  Service: Cardiovascular;  Laterality: N/A;   Thyroidectomy x2     TRANSCATHETER AORTIC VALVE REPLACEMENT, TRANSFEMORAL N/A 08/29/2021   Procedure: Transcatheter Aortic Valve Replacement, Transfemoral;  Surgeon: Tony Mocha, MD;  Location: [facility];  Service: Open Heart Surgery;  Laterality: N/A;   Patient Active Problem List   Diagnosis Date Noted   Acute stroke due to ischemia ([place]) 03/22/2022   Aortic valve disorder 09/07/2021   Diabetic renal disease ([place]) 09/07/2021   Fatty liver 09/07/2021   Gastroesophageal reflux disease 09/07/2021   Heart block 09/07/2021   Hypercholesterolemia 09/07/2021   Mixed hyperlipidemia 09/07/2021   Hyperglycemia due to type 2 diabetes mellitus ([place]) 09/07/2021   Long term (current) use of insulin ([place]) 09/07/2021   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 associated with type 2 diabetes mellitus ([place]) 09/07/2021   Mitral regurgitation 09/07/2021  Moderate recurrent major depression ([place]) 09/07/2021   Obesity 09/07/2021   Severe recurrent major depression without psychotic features ([place]) 09/07/2021   S/P TAVR (transcatheter aortic valve replacement) 32/44/0102   Chronic systolic CHF (congestive heart failure) ([place]) 11/04/2019   Pacemaker 11/04/2019   Ganglion cyst 12/19/2015   Severe aortic stenosis 09/01/2014   Essential hypertension 09/01/2014   Type 2 diabetes mellitus with  peripheral neuropathy ([place]) 09/01/2014   Hypothyroidism 09/01/2014   OSA on CPAP 09/01/2014   Lumbar stenosis 07/16/2011   HNP (herniated nucleus pulposus), lumbar 07/16/2011    ONSET DATE:   03/22/2022  REFERRING DIAG: I63.9 (ICD-10-CM) - Cerebral infarction, unspecified mechanism  THERAPY DIAG:  Hemiplegia and hemiparesis following cerebral infarction affecting left non-dominant side (HCC)  Muscle weakness (generalized)  Other lack of coordination  Visuospatial deficit  Rationale for Evaluation and Treatment: Rehabilitation  PERTINENT HISTORY: [name], "Tony [name] is a 72 y.o. male who presents for new onset of facial droop, numbness.  Per patient, he was at home, had just eaten an ice cream sandwich, when he felt tingling in the right side of his face, heaviness of the left hand and had a facial droop per family.  His daughter is an anesthesiologist and his son is a paramedic, they responded quickly and he was brought to the ED. CT head showed mild chronic changes.  CTA of the head and neck showed some moderate intracranial atherosclerosis.  Labs showed an elevated Cr to 1.48, Glucose of 169.  Baseline Cr appears to be around 1.1 - 1.2.  T bili of 1.8, GFR of 50, H/H 15 and 45.  INR 1.0.", lumbar laminectomy/decompression 07/19/2011   PRECAUTIONS: Fall and Pacemaker  SUBJECTIVE: My back has been hurting for about 4 days. It does this about every 6-8 months  PAIN:  Are you having pain? No   OBJECTIVE:   TODAY'S TREATMENT:                                                                                                                               - Therapeutic exercises completed for duration as noted below including:  OT initiated [place] as noted in patient instructions including pick up and placement of items, shuffling and turning cards, item stacking and unstacking, rolling a piece of tissue paper into a ball, rolling golf  balls in hand, rolling a pen in between fingers and thumb, picking up and storing items in hand, and then translating stored items to tips of thumb and index finger before placing them individually into a container. Patient able to return demonstration of all exercises. Handout provided.  OT initiated BUE red Thera-Band HEP including shoulder flexion, horizontal abd/add, bicep curl, and tricep extension to promote strengthening of affected extremity and overall endurance as noted in pt instructions.    - Self-care/home management completed for duration as noted below including:   OT educated pt on fall  prevention including adequate lighting when going from bedroom to bathroom.   Given pt's abnormal BP, pt was educated on the importance of hydrating including limiting caffeine intake.     GOALS:     SHORT TERM GOALS: Target date: 04/26/2022     Patient will demonstrate initial HEP with 25% verbal cues or less for proper execution. Baseline: Goal status: MET   2.  Pt will verbalize understanding of vision strategies to improve safety with functional mobility. Baseline:  Goal status: MET     LONG TERM GOALS: Target date: 05/25/2022     Patient will be independent with updated BUE HEP. Baseline:  Goal status: ONGOING   2.  Pt will demonstrate at least 75 lbs L grip strength.  Baseline: 65 lbs Goal status: ONGOING   3.  Pt will improve 9 hole peg test score on R and L by at least 5 seconds. Baseline: Right: 35 sec; Left: 39 sec Goal status: ONGOING   4.  Pt will demonstrate good safety while completing functional standing task(s) for at least 15 minutes without rest break. Baseline:  Goal status: ONGOING  ASSESSMENT:   CLINICAL IMPRESSION: Pt remains good candidate for skilled OT services to improve independence and safety with completion of ADLs and IADLs.   PERFORMANCE DEFICITS: in functional skills including ADLs, IADLs, coordination, proprioception, strength, pain, Fine  motor control, mobility, balance, body mechanics, endurance, cardiopulmonary status limiting function, vision, and UE functional use, cognitive skills including attention, memory, safety awareness, thought, and may be age appropriate but will continue to monitor , and psychosocial skills including routines and behaviors.    IMPAIRMENTS: are limiting patient from ADLs, IADLs, and leisure.    CO-MORBIDITIES: may have co-morbidities  that affects occupational performance. Patient will benefit from skilled OT to address above impairments and improve overall function.   MODIFICATION OR ASSISTANCE TO COMPLETE EVALUATION: No modification of tasks or assist necessary to complete an evaluation.   OT OCCUPATIONAL PROFILE AND HISTORY: Detailed assessment: Review of records and additional review of physical, cognitive, psychosocial history related to current functional performance.   CLINICAL DECISION MAKING: LOW - limited treatment options, no task modification necessary   REHAB POTENTIAL: Good    PLAN:   OT FREQUENCY: 1x/week   OT DURATION: 8 weeks   PLANNED INTERVENTIONS: self care/ADL training, therapeutic exercise, therapeutic activity, neuromuscular re-education, manual therapy, gait training, balance training, stair training, functional mobility training, ultrasound, paraffin, fluidotherapy, moist heat, patient/family education, cognitive remediation/compensation, visual/perceptual remediation/compensation, psychosocial skills training, energy conservation, coping strategies training, DME and/or AE instructions, and Re-evaluation   RECOMMENDED OTHER SERVICES: Will continue to monitor cognition. He may benefit from ST eval.    CONSULTED AND AGREED WITH PLAN OF CARE: Patient   PLAN FOR NEXT SESSION: Review coordination HEP; Dynamic stand balance, core/trunk strengthening, progress towards [place], OT 05/16/2022, 2:37 PM

## 2022-05-16 NOTE — Therapy (Signed)
OUTPATIENT PHYSICAL THERAPY NEURO TREATMENT   Patient Name: [name] MRN: [id number] DOB:[birth year], 72 y.o., male Today's Date: 05/16/2022   PCP: Kathalene Frames, MD REFERRING PROVIDER: Darliss Cheney, MD  END OF SESSION:  PT End of Session - 05/16/22 1109     Visit Number 5    Number of Visits 9   8+eval   Date for PT Re-Evaluation 06/01/22   pushed out due to potential delay in PT availability   Authorization Type [employer] MEDICARE    Progress Note Due on Visit 10    PT Start Time 1104    PT Stop Time 1144    PT Time Calculation (min) 40 min    Equipment Utilized During Treatment Gait belt    Activity Tolerance Patient limited by pain             Past Medical History:  Diagnosis Date   Cancer ([place])    thyroid   CHF (congestive heart failure) ([place])    Complete heart block ([place])    a. 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 Burt Knack and Dr. Cyndia Bent   Severe aortic stenosis    Sleep apnea    Past Surgical History:  Procedure Laterality Date   CARDIAC CATHETERIZATION     INTRAOPERATIVE TRANSTHORACIC ECHOCARDIOGRAM N/A 08/29/2021   Procedure: INTRAOPERATIVE TRANSTHORACIC ECHOCARDIOGRAM;  Surgeon: [name], MD;  Location: [facility];  Service: Open Heart Surgery;  Laterality: N/A;   LUMBAR LAMINECTOMY/DECOMPRESSION MICRODISCECTOMY  07/19/2011   Procedure: LUMBAR LAMINECTOMY/DECOMPRESSION MICRODISCECTOMY 3 LEVELS;  Surgeon: Melina Schools, MD;  Location: [facility];  Service: Orthopedics;  Laterality: Left;  Lumbar three-Lumbar five LEFT DECOMPRESSION AND FORAMINOTOMY  Lumbar three-four LEFT DISCECTOMY   PERMANENT PACEMAKER INSERTION N/A 08/11/2014   MDT Adapta L implanted by Dr [name] for CHB   RIGHT HEART CATH AND CORONARY ANGIOGRAPHY N/A 08/07/2021   Procedure: RIGHT HEART CATH AND CORONARY ANGIOGRAPHY;  Surgeon: [name], MD;  Location: [facility];  Service: Cardiovascular;  Laterality: N/A;   RIGHT/LEFT HEART CATH AND CORONARY ANGIOGRAPHY N/A 08/07/2021   Procedure: RIGHT/LEFT HEART CATH AND CORONARY ANGIOGRAPHY;  Surgeon: [name], MD;  Location: [facility];  Service: Cardiovascular;  Laterality: N/A;   Thyroidectomy x2     TRANSCATHETER AORTIC VALVE REPLACEMENT, TRANSFEMORAL N/A 08/29/2021   Procedure: Transcatheter Aortic Valve Replacement, Transfemoral;  Surgeon: [name], MD;  Location: [facility];  Service: Open Heart Surgery;  Laterality: N/A;   Patient Active Problem List   Diagnosis Date Noted   Acute stroke due to ischemia ([place]) 03/22/2022   Aortic valve disorder 09/07/2021   Diabetic renal disease ([place]) 09/07/2021   Fatty liver 09/07/2021   Gastroesophageal reflux disease 09/07/2021   Heart block 09/07/2021   Hypercholesterolemia 09/07/2021   Mixed hyperlipidemia 09/07/2021   Hyperglycemia due to type 2 diabetes mellitus ([place]) 09/07/2021   Long term (current) use of insulin ([place]) 09/07/2021   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 associated with type 2 diabetes mellitus ([place])  09/07/2021   Mitral regurgitation 09/07/2021   Moderate recurrent major depression ([place]) 09/07/2021   Obesity 09/07/2021   Severe recurrent major depression without psychotic features ([place]) 09/07/2021   S/P TAVR (transcatheter aortic valve replacement) 16/01/9603   Chronic systolic CHF (congestive heart failure) ([place]) 11/04/2019   Pacemaker 11/04/2019   Ganglion cyst 12/19/2015   Severe aortic stenosis 09/01/2014   Essential hypertension 09/01/2014   Type 2 diabetes mellitus  with peripheral neuropathy ([place]) 09/01/2014   Hypothyroidism 09/01/2014   OSA on CPAP 09/01/2014   Lumbar stenosis 07/16/2011   HNP (herniated nucleus pulposus), lumbar 07/16/2011    ONSET DATE: 03/23/2022 (referral)  REFERRING DIAG: I63.9 (ICD-10-CM) - Cerebral infarction, unspecified mechanism ([place])  THERAPY DIAG:  Hemiplegia and hemiparesis following cerebral infarction affecting left non-dominant side (HCC)  Muscle weakness (generalized)  Unsteadiness on feet  Other lack of coordination  Other abnormalities of gait and mobility  Rationale for Evaluation and Treatment: Rehabilitation  SUBJECTIVE:                                                                                                                                                                                             SUBJECTIVE STATEMENT: Pt states his low back is feeling better today.  He states he is feeling more stable overall lately.  He denies falls or pain today. Pt accompanied by: self  PERTINENT HISTORY: Lumbar stenosis, aortic stenosis, HTN, DM II, pacemaker, chronic systolic CHF, GERD, hyperlipidemia, major depression  Pt presented to the ED 03/22/2022 w/ new onset of tingling in the left side of his face, heaviness of the left hand and had a facial droop per family.  "He has had chronic issues with lightheadedness, low blood pressures and disequilibrium since having his TAVR procedure.  He has been in rehab and has started getting strength back. He has chronic right sided blindness, due to previous CVA per report from family.  This was about 10 years ago."  PAIN:  Are you having pain? 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  GAIT: Gait pattern: step through pattern, decreased stride length, decreased hip/knee flexion- Right, decreased hip/knee flexion- Left, trunk flexed, narrow BOS, poor foot clearance- Right, and poor foot clearance- Left Distance walked: 400' Assistive device utilized: None Level of assistance: CGA Comments: Trialed 2 lb bilateral ankle weights with ambulation for increased proprioceptive input (remained on for all activities minus treadmill training).  Pt verbalizes deficits as they occur, but demonstrates minimal attempts to correct w/o cuing and minimal carryover from treadmill to overground.  Cued to promote shoulders back, bilateral foot clearance and minimize forward momentum.  Pt has instance of bilateral foot scuff causing forward lean and LOB w/ minA to correct.  -tandem forwards and backwards 8x8' in // bars progressing to no UE support w/ left lean and LOB x4 -backward walking 8x8' in // bars progressing to no UE support, - 4" hurdles 8x8' forward then laterally progressing to no UE support w/ increased forward trunk lean  PATIENT EDUCATION: Education details: Continued to emphasize safety w/ ambulation and preventing uncontrolled momentum with walking.  Continue HEP. Person educated: Patient Education method: Explanation Education comprehension: verbalized understanding  HOME EXERCISE PROGRAM:  Access Code: [email or web address]: [email or web address] Date: 04/26/2022 Prepared by: MaElease EtienneExercises - Sit to Stand with Arms Crossed  - 2 x daily - 7 x weekly - 1 sets - 10 reps - Standing Near Stance in Corner with Eyes Closed  - 1 x daily - 5 x weekly - 1 sets - 3 reps - 30 seconds hold - Standing in corner with eyes closed  - 1 x daily - 5 x weekly - 1 sets - 10 reps - Tandem Walking with Counter Support  - 1 x daily - 5 x weekly - 1 sets - 3 reps - Toe Walking with  Counter Support  - 1 x daily - 5 x weekly - 1 sets - 3 reps - Heel Walking with Counter Support  - 1 x daily - 5 x weekly - 1 sets - 3 reps - Backward Walking with Counter Support  - 1 x daily - 5 x weekly - 3 sets - 10 reps - Walking with Eyes Closed and Counter Support  - 1 x daily - 5 x weekly - 3 sets - 10 reps  You Can Walk For A Certain Length Of Time Each Day                          Walk 10 minutes 1 times per day.             Increase 2-5  minutes every 7 days              Work up to 20 minutes (1-2 times per day).               Example:                         Day 1-2           4-5 minutes     3 times per day                         Day 7-8           10-12 minutes 2-3 times per day  Day 13-14       20-22 minutes 1-2 times per day  GOALS: Goals reviewed with patient? Yes  SHORT TERM GOALS: Target date: 04/27/2022  Pt will be independent with strength and balance HEP to maintain gains at home. Baseline:  Pt is intermittently compliant. Goal status: IN PROGRESS  2.  Pt will improve balance confidence to >70% on ABC scale to demonstrate decreased fear of falling. Baseline:  62.5% (12/15); 67.5% (1/4) Goal status: IN PROGRESS  3.  Pt will decrease 5xSTS to </=13 seconds in order to demonstrate decreased risk for falls and improved functional bilateral LE strength and power. Baseline: 14.84 sec no UE support; 20.31 sec no UE support Goal status: NOT MET  4.  Pt will ambulate >1200 feet on 6MWT w/o dyspnea or increased left trunk lean to demonstrate improved functional endurance for home and community participation. Baseline:  1205' w/ inc left trunk lean and dyspnea (12/15); 718' w/ continued fatigue and deficits from prior (1/4) Goal status: NOT MET  5.  Pt will improve FGA score to >/=22/30 in order to demonstrate improved balance and decreased fall risk. Baseline: 17/30 (12/15); 18/30 (1/4) Goal status: IN PROGRESS  LONG TERM GOALS: Target date:  05/25/2022  Pt will be compliant to walking program no less than 3 days per week to promote aerobic endurance. Baseline: Currently pt reports he is fairly inactive. Goal status: INITIAL  2.  Pt will improve balance confidence to >80% on ABC scale to demonstrate decreased fear of falling. Baseline: 62.5% (12/15) Goal status: INITIAL  3.  Pt will ambulate >/=1400 feet on 6MWT to demonstrate improved functional endurance for home and community participation. Baseline: 1205' (12/15) Goal status: INITIAL  4.  Pt will improve FGA score to >/=27/30 in order to demonstrate improved balance and decreased fall risk. Baseline: 17/30 (12/15) Goal status: INITIAL  ASSESSMENT:  CLINICAL IMPRESSION: Continued addressing gait training this session to address width of BOS and safety with better controlled forward momentum.  Pt has little carryover from treadmill to overground this visit, but may improve with this if continued in coming visits.  Will continue to address hip weakness, gait deficits, and general safety awareness in order to progress towards LTGs.     OBJECTIVE IMPAIRMENTS: Abnormal gait, decreased activity tolerance, decreased balance, decreased endurance, decreased strength, improper body mechanics, and postural dysfunction.   ACTIVITY LIMITATIONS: locomotion level  PARTICIPATION LIMITATIONS: community activity  PERSONAL FACTORS: Age, Behavior pattern, Fitness, Past/current experiences, and 1-2 comorbidities: HTN, lumbar stenosis  are also affecting patient's functional outcome.   REHAB POTENTIAL: Excellent  CLINICAL DECISION MAKING: Stable/uncomplicated  EVALUATION COMPLEXITY: Low  PLAN:  PT FREQUENCY: 1x/week  PT DURATION: 8 weeks  PLANNED INTERVENTIONS: Therapeutic exercises, Therapeutic activity, Neuromuscular re-education, Balance training, Gait training, Patient/Family education, Self Care, Joint mobilization, Stair training, Vestibular training, DME instructions, and  Re-evaluation  PLAN FOR NEXT SESSION:  deficits from FGA, left trunk lean and widen BOS w/ gait, endurance, upright posture during standing and dynamic tasks, hip strengthening   Bary Richard, PT, DPT 05/16/2022, 1:15 PM

## 2022-05-22 ENCOUNTER — Ambulatory Visit: Payer: Medicare PPO | Attending: Cardiovascular Disease | Admitting: Cardiovascular Disease

## 2022-05-23 ENCOUNTER — Encounter: Payer: Self-pay | Admitting: Physical Therapy

## 2022-05-23 ENCOUNTER — Ambulatory Visit: Payer: Medicare PPO | Admitting: Occupational Therapy

## 2022-05-23 ENCOUNTER — Ambulatory Visit: Payer: Medicare PPO | Admitting: Physical Therapy

## 2022-05-23 VITALS — BP 165/85 | HR 73

## 2022-05-23 DIAGNOSIS — R278 Other lack of coordination: Secondary | ICD-10-CM

## 2022-05-23 DIAGNOSIS — M6281 Muscle weakness (generalized): Secondary | ICD-10-CM

## 2022-05-23 DIAGNOSIS — R2681 Unsteadiness on feet: Secondary | ICD-10-CM | POA: Diagnosis not present

## 2022-05-23 DIAGNOSIS — R2689 Other abnormalities of gait and mobility: Secondary | ICD-10-CM

## 2022-05-23 DIAGNOSIS — I69354 Hemiplegia and hemiparesis following cerebral infarction affecting left non-dominant side: Secondary | ICD-10-CM

## 2022-05-23 NOTE — Therapy (Unsigned)
OUTPATIENT PHYSICAL THERAPY NEURO TREATMENT/DISCHARGE SUMMARY   Patient Name: Tony [name] MRN: [id number] DOB:[birth year], 72 y.o., male Today's Date: 05/23/2022   PCP: Kathalene Frames, MD REFERRING PROVIDER: Darliss Cheney, MD  PHYSICAL THERAPY DISCHARGE SUMMARY  Visits from Start of Care: 6  Current functional level related to goals / functional outcomes: See clinical assessment summary.   Remaining deficits: Narrowed BOS, forward trunk lean and left drift during gait, pt not wanting to use AD at this time, general imbalance and LE weakness.   Education / Equipment: Safety with ambulation, process for re-cert vs discharge and benefit of continuing PT, potential use of cane or trekking poles especially outdoors for endurance and balance.   Patient agrees to discharge. Patient goals were partially met. Patient is being discharged due to  pt request/being pleased with his current progress.   END OF SESSION:  PT End of Session - 05/23/22 1153     Visit Number 6    Number of Visits 9   8+eval   Date for PT Re-Evaluation 06/01/22   pushed out due to potential delay in PT availability   Authorization Type [employer] MEDICARE    Progress Note Due on Visit 10    PT Start Time 1149    PT Stop Time 1233    PT Time Calculation (min) 44 min    Equipment Utilized During Treatment Gait belt    Activity Tolerance Patient tolerated treatment well             Past Medical History:  Diagnosis Date   Cancer ([place])    thyroid   CHF (congestive heart failure) (HCC)    Complete heart block ([place])    a. 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 Burt Knack and Dr. Cyndia Bent   Severe aortic stenosis    Sleep apnea    Past Surgical History:  Procedure Laterality Date   CARDIAC CATHETERIZATION     INTRAOPERATIVE TRANSTHORACIC ECHOCARDIOGRAM N/A 08/29/2021   Procedure: INTRAOPERATIVE TRANSTHORACIC ECHOCARDIOGRAM;  Surgeon: [name], MD;  Location: [facility];  Service: Open Heart Surgery;  Laterality: N/A;   LUMBAR LAMINECTOMY/DECOMPRESSION MICRODISCECTOMY  07/19/2011   Procedure: LUMBAR LAMINECTOMY/DECOMPRESSION MICRODISCECTOMY 3 LEVELS;  Surgeon: Melina Schools, MD;  Location: [facility];  Service: Orthopedics;  Laterality: Left;  Lumbar three-Lumbar five LEFT DECOMPRESSION AND FORAMINOTOMY Lumbar three-four LEFT DISCECTOMY   PERMANENT PACEMAKER INSERTION N/A 08/11/2014   MDT Adapta L implanted by Dr [name] for CHB   RIGHT HEART CATH AND CORONARY ANGIOGRAPHY N/A 08/07/2021   Procedure: RIGHT HEART CATH AND CORONARY ANGIOGRAPHY;  Surgeon: [name], MD;  Location: [facility];  Service: Cardiovascular;  Laterality: N/A;   RIGHT/LEFT HEART CATH AND CORONARY ANGIOGRAPHY N/A 08/07/2021   Procedure: RIGHT/LEFT HEART CATH AND CORONARY ANGIOGRAPHY;  Surgeon: [name], MD;  Location: [facility];  Service: Cardiovascular;  Laterality: N/A;   Thyroidectomy x2     TRANSCATHETER AORTIC VALVE REPLACEMENT, TRANSFEMORAL N/A 08/29/2021   Procedure: Transcatheter Aortic Valve Replacement, Transfemoral;  Surgeon: [name], MD;  Location: [place] [facility];  Service: Open Heart Surgery;  Laterality: N/A;   Patient Active Problem List   Diagnosis Date Noted   Acute stroke due to ischemia ([place]) 03/22/2022   Aortic valve disorder 09/07/2021   Diabetic renal disease ([place]) 09/07/2021   Fatty liver 09/07/2021   Gastroesophageal reflux disease 09/07/2021   Heart block 09/07/2021   Hypercholesterolemia 09/07/2021   Mixed hyperlipidemia 09/07/2021   Hyperglycemia due to  type 2 diabetes mellitus ([place]) 09/07/2021   Long term (current) use of insulin ([place]) 09/07/2021   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 associated with type 2 diabetes mellitus ([place]) 09/07/2021   Mitral regurgitation 09/07/2021   Moderate recurrent major depression ([place]) 09/07/2021   Obesity 09/07/2021   Severe recurrent major depression without psychotic features ([place]) 09/07/2021   S/P TAVR (transcatheter aortic valve replacement) 67/61/9509   Chronic systolic CHF (congestive heart failure) ([place]) 11/04/2019   Pacemaker 11/04/2019   Ganglion cyst 12/19/2015   Severe aortic stenosis 09/01/2014   Essential hypertension 09/01/2014   Type 2 diabetes mellitus with peripheral neuropathy ([place]) 09/01/2014   Hypothyroidism 09/01/2014   OSA on CPAP 09/01/2014   Lumbar stenosis 07/16/2011   HNP (herniated nucleus pulposus), lumbar 07/16/2011    ONSET DATE: 03/23/2022 (referral)  REFERRING DIAG: I63.9 (ICD-10-CM) - Cerebral infarction, unspecified mechanism ([place])  THERAPY DIAG:  Hemiplegia and hemiparesis following cerebral infarction affecting left non-dominant side (HCC)  Muscle weakness (generalized)  Other lack of coordination  Unsteadiness on feet  Other abnormalities of gait and mobility  Rationale for Evaluation and Treatment: Rehabilitation  SUBJECTIVE:                                                                                                                                                                                             SUBJECTIVE STATEMENT: Pt ambulates into clinic drifting to the left significantly and resting against wall stating "I have no idea why I am drifting so bad today."  PT provides CGA to guide patient to mat table. He denies falls or pain.  He states he feels sleepy despite sleeping well last night. Pt accompanied by: self  PERTINENT HISTORY: Lumbar stenosis, aortic stenosis, HTN, DM II, pacemaker, chronic  systolic CHF, GERD, hyperlipidemia, major depression  Pt presented to the ED 03/22/2022 w/ new onset of tingling in the left side of his face, heaviness of the left hand and had a facial droop per family.  "He has had chronic issues with lightheadedness, low blood pressures and disequilibrium since having his TAVR procedure.  He has been in rehab and has started getting strength  back. He has chronic right sided blindness, due to previous CVA per report from family.  This was about 10 years ago."  PAIN:  Are you having pain? 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  PT recommends keeping near bed for use at night or in a hurry to prevent falls (pt is familiar with how to use cane per report), and further recommended use of SPC or trekking pole outdoors or long distance w/ pt stating he will try this (prefers trekking pole outdoors). -Discussion of re-cert vs discharge w/ pt verbalizing that he feels good about discharging today and will return if needed. -Discussed walking program w/ progressions, pt is walking 3x a week at current. -PT administers ABC scale:  84.375% -FGA:    [facility] PT Assessment - 05/23/22 1210       Functional Gait  Assessment   Gait assessed  Yes    Gait Level Surface Walks 20 ft in less than 7 sec but greater than 5.5 sec, uses assistive device, slower speed, mild gait deviations, or deviates 6-10 in outside of the 12 in walkway width.    Change in Gait Speed Able to change speed, demonstrates mild gait deviations, deviates  6-10 in outside of the 12 in walkway width, or no gait deviations, unable to achieve a major change in velocity, or uses a change in velocity, or uses an assistive device.    Gait with Horizontal Head Turns Performs head turns smoothly with slight change in gait velocity (eg, minor disruption to smooth gait path), deviates 6-10 in outside 12 in walkway width, or uses an assistive device.    Gait with Vertical Head Turns Performs task with slight change in gait velocity (eg, minor disruption to smooth gait path), deviates 6 - 10 in outside 12 in walkway width or uses assistive device    Gait and Pivot Turn Pivot turns safely in greater than 3 sec and stops with no loss of balance, or pivot turns safely within 3 sec and stops with mild imbalance, requires small steps to catch balance.    Step Over Obstacle Is able to step over one shoe box (4.5 in total height) without changing gait speed. No evidence of imbalance.    Gait with Narrow Base of Support Ambulates 7-9 steps.    Gait with Eyes Closed Walks 20 ft, slow speed, abnormal gait pattern, evidence for imbalance, deviates 10-15  in outside 12 in walkway width. Requires more than 9 sec to ambulate 20 ft.    Ambulating Backwards Walks 20 ft, uses assistive device, slower speed, mild gait deviations, deviates 6-10 in outside 12 in walkway width.    Steps Alternating feet, must use rail.    Total Score 19            -6MWT 800' CGA, forward lean, improved width BOS, requires x3 standing and seated rest   PATIENT EDUCATION: Education details: Plan for discharge per pt request.  Continued to emphasize safety w/ ambulation and preventing uncontrolled momentum with walking.  Continue HEP.  Discussed monitoring blood sugar regularly-pt states he just changed to Dexcom as Elenor Legato was too expensive and he has not put his monitor on today. Person educated: Patient Education method: Explanation Education comprehension: verbalized understanding  HOME  EXERCISE PROGRAM:  Access Code: [email or web address] URL: [email or web address] Date: 04/26/2022 Prepared by: Elease Etienne  Exercises - Sit to Stand with Arms Crossed  - 2 x daily - 7 x weekly - 1 sets - 10 reps - Standing Near Stance in Corner with Eyes Closed  - 1 x daily - 5 x weekly - 1 sets - 3 reps - 30 seconds hold - Standing in corner with eyes closed  - 1 x daily - 5 x weekly - 1 sets - 10 reps - Tandem Walking with Counter Support  - 1 x daily - 5 x weekly - 1 sets - 3 reps - Toe Walking with Counter Support  - 1 x daily - 5 x weekly - 1 sets - 3 reps - Heel Walking with Counter Support  - 1 x daily - 5 x weekly - 1 sets - 3 reps - Backward Walking with Counter Support  - 1 x daily - 5 x weekly - 3 sets - 10 reps - Walking with Eyes Closed and Counter Support  - 1 x daily - 5 x weekly - 3 sets - 10 reps  You Can Walk For A Certain Length Of Time Each Day                          Walk 10 minutes 1 times per day.             Increase 2-5  minutes every 7 days              Work up to 20 minutes (1-2 times per day).               Example:                         Day 1-2           4-5 minutes     3 times per day                         Day 7-8           10-12 minutes 2-3 times per day                         Day 13-14       20-22 minutes 1-2 times per day  GOALS: Goals reviewed with patient? Yes  SHORT TERM GOALS: Target date: 04/27/2022  Pt will be independent with strength and balance HEP to maintain gains at home. Baseline:  Pt is  intermittently compliant. Goal status: IN PROGRESS  2.  Pt will improve balance confidence to >70% on ABC scale to demonstrate decreased fear of falling. Baseline:  62.5% (12/15); 67.5% (1/4) Goal status: IN PROGRESS  3.  Pt will decrease 5xSTS to </=13 seconds in order to demonstrate decreased risk for falls and improved functional bilateral LE strength and power. Baseline: 14.84 sec no UE support; 20.31 sec no UE support Goal  status: NOT MET  4.  Pt will ambulate >1200 feet on 6MWT w/o dyspnea or increased left trunk lean to demonstrate improved functional endurance for home and community participation. Baseline:  1205' w/ inc left trunk lean and dyspnea (12/15); 718' w/ continued fatigue and deficits from prior (1/4) Goal status: NOT MET  5.  Pt will improve FGA score to >/=22/30 in order to demonstrate improved balance and decreased fall risk. Baseline: 17/30 (12/15); 18/30 (1/4) Goal status: IN PROGRESS  LONG TERM GOALS: Target date: 05/25/2022  Pt will be compliant to walking program no less than 3 days per week to promote aerobic endurance. Baseline: Currently pt reports he is fairly inactive.; Pt states he walks 3x per week unsure of amount of time (1/24) Goal status: MET  2.  Pt will improve balance confidence to >80% on ABC scale to demonstrate decreased fear of falling. Baseline: 62.5% (12/15); 84.38% (1/24) Goal status: MET  3.  Pt will ambulate >/=1400 feet on 6MWT to demonstrate improved functional endurance for home and community participation. Baseline: 1205' (12/15); 800' (1/24) Goal status: NOT MET  4.  Pt will improve FGA score to >/=27/30 in order to demonstrate improved balance and decreased fall risk. Baseline: 17/30 (12/15); 19/30 (1/24) Goal status: NOT MET  ASSESSMENT:  CLINICAL IMPRESSION: Assessed LTGs this session with patient meeting 2 of 4 goals.  He continues to have difficulty controlling momentum during ambulation often resulting in worsening narrowed BOS and forward trunk lean, but is reluctant to rely on a cane at this point in time.  He was only able to tolerate 800' of ambulation during 6MWT this session.  He did improve his FGA to 19/30 from 17/30, but did not meet significant goal level.  He does perceive his balance and confidence with dynamic tasks to be improved rating himself at 84.38% and he is maintaining aerobic activity 3x a week.  Pt would benefit from further PT  services likely in the future when he is more amenable to AD use for safety.  At this time, will discharge per pt request.  OBJECTIVE IMPAIRMENTS: Abnormal gait, decreased activity tolerance, decreased balance, decreased endurance, decreased strength, improper body mechanics, and postural dysfunction.   ACTIVITY LIMITATIONS: locomotion level  PARTICIPATION LIMITATIONS: community activity  PERSONAL FACTORS: Age, Behavior pattern, Fitness, Past/current experiences, and 1-2 comorbidities: HTN, lumbar stenosis  are also affecting patient's functional outcome.   REHAB POTENTIAL: Excellent  CLINICAL DECISION MAKING: Stable/uncomplicated  EVALUATION COMPLEXITY: Low  PLAN:  PT FREQUENCY: 1x/week  PT DURATION: 8 weeks  PLANNED INTERVENTIONS: Therapeutic exercises, Therapeutic activity, Neuromuscular re-education, Balance training, Gait training, Patient/Family education, Self Care, Joint mobilization, Stair training, Vestibular training, DME instructions, and Re-evaluation  PLAN FOR NEXT SESSION:  N/A   Bary Richard, PT, DPT 05/23/2022, 12:36 PM

## 2022-05-30 ENCOUNTER — Ambulatory Visit: Payer: Medicare PPO | Admitting: Physical Therapy

## 2022-05-30 ENCOUNTER — Ambulatory Visit: Payer: Medicare PPO | Admitting: Occupational Therapy

## 2022-05-31 ENCOUNTER — Encounter: Payer: Self-pay | Admitting: Neurology

## 2022-05-31 ENCOUNTER — Ambulatory Visit: Payer: Medicare PPO | Admitting: Neurology

## 2022-05-31 VITALS — BP 147/87 | HR 67 | Ht 72.0 in | Wt 239.0 lb

## 2022-05-31 DIAGNOSIS — R269 Unspecified abnormalities of gait and mobility: Secondary | ICD-10-CM

## 2022-05-31 DIAGNOSIS — G4733 Obstructive sleep apnea (adult) (pediatric): Secondary | ICD-10-CM | POA: Insufficient documentation

## 2022-05-31 DIAGNOSIS — G3184 Mild cognitive impairment, so stated: Secondary | ICD-10-CM | POA: Diagnosis not present

## 2022-05-31 DIAGNOSIS — I639 Cerebral infarction, unspecified: Secondary | ICD-10-CM | POA: Insufficient documentation

## 2022-05-31 MED ORDER — CLOPIDOGREL BISULFATE 75 MG PO TABS: 75.0000 mg | ORAL_TABLET | Freq: Every day | ORAL | 3 refills | Status: AC

## 2022-05-31 NOTE — Progress Notes (Signed)
Chief Complaint  Patient presents with   New Patient (Initial Visit)    Rm 17 here for consult on CVA. Pt reports had a stroke on 03/25/2022. Pt reports since this event he has felt dizzy and unstable at times but over all doing fairly well.       ASSESSMENT AND PLAN  [name] is a 72 y.o. 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, likely right posterior limb of internal capsule acute small vessel event,  Vascular risk factor of aging, hypertension, hyperlipidemia, diabetes, history of aortic valve replacement,  Plavix 75 mg daily  Slow worsening gait abnormality,  Brisk reflex on examinations, CT cervical also demonstrate multilevel degenerative changes, variable degree of foraminal narrowing,  Discussed with patient and his daughter, decided to proceed with CT myelogram, will refer to neurosurgeon to do so   Mild cognitive impairment  MoCA examination 20/30  Laboratory evaluation to rule out treatable etiology   Return To Clinic With NP In 6 Months   DIAGNOSTIC DATA (LABS, IMAGING, TESTING) - I reviewed patient records, labs, notes, testing and imaging myself where available.   MEDICAL HISTORY:  Tony [name] is a 72 year old male, seen in request by his primary care physician Dr. Koleen Nimrod, [facility] for evaluation of TIA, gait abnormality, memory loss, I was also able to talk with his daughter Jinny Blossom who is an anesthesiologist over the phone,   I reviewed and summarized the referring note.PMHX. HLD DM HTN S/p pacemake History of severe AS s/p TAVR (08/29/21)  History of sudden right visual loss  Patient used to work as a Product/process development scientist, but had significant deconditioning over the years, he had bradycardia, required pacemaker, severe aortic stenosis, status post TAVR in May 2023  Over the past few years, he was noted to have unsteady gait, fall risk, also slow worsening word finding difficulties, MoCA examination is 20/30  He had history of right  sudden visual loss many years ago, was thought due to right central retinal artery event, but was not on any antiplatelet agent  Hospital admission on March 23, 2022, for acute onset of left facial droop, left hand numbness, weakness,  Personally reviewed CT head without contrast, no acute abnormality, mild small vessel disease, mild generalized atrophy  CT angiogram of head and neck, showed no large vessel disease,  CT angiogram reviewed, also demonstrate severe multilevel cervical degenerative changes, with evidence of variable degree of canal stenosis  He is not MRI candidate due to pacemaker, he was discharged with double antiplatelet agent aspirin plus Plavix 75 mg, now on Plavix 75 mg alone   Labs in Nov 2023, LDL 75. TSH, A1C 8.6, Hg 13.5, UDS negative  Echocardiogram only decreased ejection fraction 40 to 45%, severe concentric hypertrophy, mild global hypokinesia  PHYSICAL EXAM:   Vitals:   05/31/22 1414  BP: (!) 147/87  Pulse: 67  Weight: 239 lb (108.4 kg)  Height: 6' (1.829 m)    Body mass index is 32.41 kg/m.  PHYSICAL EXAMNIATION:  Gen: NAD, conversant, well nourised, well groomed                     Cardiovascular: Regular rate rhythm, no peripheral edema, warm, nontender. Eyes: Conjunctivae clear without exudates or hemorrhage Neck: Supple, no carotid bruits. Pulmonary: Clear to auscultation bilaterally   NEUROLOGICAL EXAM:  MENTAL STATUS: Speech/cognition: Awake, alert, oriented to history taking and casual conversation    05/31/2022    3:00 PM  Montreal Cognitive Assessment   Visuospatial/ Executive (0/5) 3  Naming (0/3) 3  Attention: Read list of digits (0/2) 2  Attention: Read list of letters (0/1) 1  Attention: Serial 7 subtraction starting at 100 (0/3) 3  Language: Repeat phrase (0/2) 2  Language : Fluency (0/1) 0  Abstraction (0/2) 2  Delayed Recall (0/5) 0  Orientation (0/6) 4  Total 20    CRANIAL NERVES: CN II: Visual fields are  full to confrontation. Pupils are round equal and briskly reactive to light. CN III, IV, VI: extraocular movement are normal. No ptosis. CN V: Facial sensation is intact to light touch CN VII: Mild left lower face weakness CN VIII: Hearing is normal to causal conversation. CN IX, X: Phonation is normal. CN XI: Head turning and shoulder shrug are intact  MOTOR: Fixation of left arm on rapid rotating movement, mild left arm pronation drift  REFLEXES: Reflexes are 2+ and symmetric at the biceps, triceps, knees, and trace ankles. Plantar responses are flexor.  SENSORY: Intact to light touch, pinprick and vibratory sensation are intact in fingers and toes.  COORDINATION: There is no trunk or limb dysmetria noted.  GAIT/STANCE: Need push-up to get up from seated position, wide-based, cautious, unsteady, especially while turning,  REVIEW OF SYSTEMS:  Full 14 system review of systems performed and notable only for as above All other review of systems were negative.   ALLERGIES: Allergies  Allergen Reactions   Corticosteroids     Per patient's daughter patient is not allergic as of 07/21/21.   Codeine Itching   Crestor [Rosuvastatin Calcium] Other (See Comments)    Leg pain   Metformin Hcl Diarrhea   Simvastatin Other (See Comments)    Leg pain    HOME MEDICATIONS: Current Outpatient Medications  Medication Sig Dispense Refill   acetaminophen (TYLENOL) 500 MG tablet Take 500 mg by mouth every 6 (six) hours as needed for moderate pain (pain).     amoxicillin (AMOXIL) 500 MG tablet Take 4 tablets (2,000 mg total) by mouth as directed. 1 hour prior to dental work including cleanings 12 tablet 12   atorvastatin (LIPITOR) 40 MG tablet Take 40 mg by mouth daily.     BD PEN NEEDLE NANO 2ND GEN 32G X 4 MM MISC      canagliflozin (INVOKANA) 300 MG TABS tablet Take 300 mg by mouth daily before breakfast.     carvedilol (COREG) 3.125 MG tablet Take 1 tablet (3.125 mg total) by mouth 2  (two) times daily with a meal. (Patient taking differently: Take 3.125 mg by mouth in the morning and at bedtime. 2 TABLETS, TWICE DAILY) 180 tablet 2   clopidogrel (PLAVIX) 75 MG tablet Take 75 mg by mouth daily.     Continuous Blood Gluc Receiver (FREESTYLE LIBRE 2 READER) DEVI by Does not apply route.     cyclobenzaprine (FLEXERIL) 10 MG tablet Take 10 mg by mouth 3 (three) times daily as needed for muscle spasms.     Dulaglutide 4.5 MG/0.5ML SOPN Inject 4.5 mg into the skin every Friday.     famotidine (PEPCID) 20 MG tablet Take 20 mg by mouth daily.     gabapentin (NEURONTIN) 100 MG capsule Take 2 capsules (200 mg total) by mouth at bedtime. (Patient taking differently: Take 100 mg by mouth at bedtime.) 60 capsule 2   Insulin Aspart FlexPen (NOVOLOG) 100 UNIT/ML Inject 5 Units into the skin daily.     insulin glargine (LANTUS SOLOSTAR) 100 UNIT/ML Solostar Pen Inject  40 Units into the skin 2 (two) times daily.     Lancets (ONETOUCH DELICA PLUS [id number]) MISC Apply topically.     levothyroxine (SYNTHROID) 175 MCG tablet Take 175 mcg by mouth daily before breakfast.     omeprazole (PRILOSEC) 10 MG capsule Take 10 mg by mouth daily.     sacubitril-valsartan (ENTRESTO) 24-26 MG Take 1 tablet by mouth daily.     sertraline (ZOLOFT) 100 MG tablet Take 200 mg by mouth daily.      Tamsulosin HCl (FLOMAX) 0.4 MG CAPS Take 1 capsule (0.4 mg total) by mouth every morning. 30 capsule 0   hydrALAZINE (APRESOLINE) 25 MG tablet Take 1 tablet (25 mg total) by mouth 4 (four) times daily. 120 tablet 0   No current facility-administered medications for this visit.    PAST MEDICAL HISTORY: Past Medical History:  Diagnosis Date   Cancer ([place])    thyroid   CHF (congestive heart failure) ([facility])    Complete heart block ([place])    a. 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 Burt Knack and Dr. Cyndia Bent   Severe aortic stenosis    Sleep apnea     PAST SURGICAL HISTORY: Past Surgical History:  Procedure Laterality Date   CARDIAC CATHETERIZATION     INTRAOPERATIVE TRANSTHORACIC ECHOCARDIOGRAM N/A 08/29/2021   Procedure: INTRAOPERATIVE TRANSTHORACIC ECHOCARDIOGRAM;  Surgeon: [name], MD;  Location: [facility];  Service: Open Heart Surgery;  Laterality: N/A;   LUMBAR LAMINECTOMY/DECOMPRESSION MICRODISCECTOMY  07/19/2011   Procedure: LUMBAR LAMINECTOMY/DECOMPRESSION MICRODISCECTOMY 3 LEVELS;  Surgeon: Melina Schools, MD;  Location: [facility];  Service: Orthopedics;  Laterality: Left;  Lumbar three-Lumbar five LEFT DECOMPRESSION AND FORAMINOTOMY Lumbar three-four LEFT DISCECTOMY   PERMANENT PACEMAKER INSERTION N/A 08/11/2014   MDT Adapta L implanted by Dr [name] for CHB   RIGHT HEART CATH AND CORONARY ANGIOGRAPHY N/A 08/07/2021   Procedure: RIGHT HEART CATH AND CORONARY ANGIOGRAPHY;  Surgeon: [name], MD;  Location: [facility];  Service: Cardiovascular;  Laterality: N/A;   RIGHT/LEFT HEART CATH AND CORONARY ANGIOGRAPHY N/A 08/07/2021   Procedure: RIGHT/LEFT HEART CATH AND CORONARY ANGIOGRAPHY;  Surgeon: [name], MD;  Location: [facility];  Service: Cardiovascular;  Laterality: N/A;   Thyroidectomy x2     TRANSCATHETER AORTIC VALVE REPLACEMENT, TRANSFEMORAL N/A 08/29/2021   Procedure: Transcatheter Aortic Valve Replacement, Transfemoral;  Surgeon: [name], MD;  Location: [facility];  Service: Open Heart Surgery;  Laterality: N/A;    FAMILY HISTORY: Family History  Problem Relation Age of Onset   Lung cancer Mother    Heart disease Father    Heart attack Father     Healthy Sister    Healthy Daughter    Healthy Son    Anesthesia problems Neg Hx    Hypotension Neg Hx    Pseudochol deficiency Neg Hx     SOCIAL HISTORY: Social History   Socioeconomic History   Marital status: Widowed    Spouse name: jody   Number of children: 2   Years of  education: college   Highest education level: Master's degree (e.g., MA, MS, MEng, MEd, MSW, MBA)  Occupational History   Occupation: retired  Tobacco Use   Smoking status: Never   Smokeless tobacco: Never  Vaping Use   Vaping Use: Never used  Substance and Sexual Activity   Alcohol use: No    Comment: occasional glass of wine   Drug use: No   Sexual activity: Not on file  Other Topics Concern   Not on file  Social History Narrative   Not on file   Social Determinants of Health   Financial Resource Strain: Not on file  Food Insecurity: No Food Insecurity (03/22/2022)   Hunger Vital Sign    Worried About Running Out of Food in the Last Year: Never true    Ran Out of Food in the Last Year: Never true  Transportation Needs: No Transportation Needs (03/22/2022)   PRAPARE - Hydrologist (Medical): No    Lack of Transportation (Non-Medical): No  Physical Activity: Not on file  Stress: Not on file  Social Connections: Not on file  Intimate Partner Violence: Not At Risk (03/23/2022)   Humiliation, Afraid, Rape, and Kick questionnaire    Fear of Current or Ex-Partner: No    Emotionally Abused: No    Physically Abused: No    Sexually Abused: No      Marcial Pacas, M.D. Ph.D.  [facility] [address], [place], [place] [address] Ph: [phone] Fax: [phone]  CC:  Kathalene Frames, MD [address] [place],  [place] [address]  Kathalene Frames, MD

## 2022-06-01 ENCOUNTER — Telehealth: Payer: Self-pay | Admitting: Neurology

## 2022-06-01 LAB — VITAMIN B12: Vitamin B-12: 417 pg/mL (ref 232–1245)

## 2022-06-01 LAB — RPR: RPR Ser Ql: NONREACTIVE

## 2022-06-01 NOTE — Telephone Encounter (Signed)
Referral for neurosurgery fax to [facility]. Phone: [phone], Fax: [phone]. 

## 2022-08-06 DIAGNOSIS — E1121 Type 2 diabetes mellitus with diabetic nephropathy: Secondary | ICD-10-CM | POA: Diagnosis not present

## 2022-08-06 DIAGNOSIS — N183 Chronic kidney disease, stage 3 unspecified: Secondary | ICD-10-CM | POA: Diagnosis not present

## 2022-08-06 DIAGNOSIS — Z794 Long term (current) use of insulin: Secondary | ICD-10-CM | POA: Diagnosis not present

## 2022-08-06 DIAGNOSIS — I130 Hypertensive heart and chronic kidney disease with heart failure and stage 1 through stage 4 chronic kidney disease, or unspecified chronic kidney disease: Secondary | ICD-10-CM | POA: Diagnosis not present

## 2022-08-06 DIAGNOSIS — I5022 Chronic systolic (congestive) heart failure: Secondary | ICD-10-CM | POA: Diagnosis not present

## 2022-08-06 DIAGNOSIS — I10 Essential (primary) hypertension: Secondary | ICD-10-CM | POA: Diagnosis not present

## 2022-08-06 DIAGNOSIS — R5383 Other fatigue: Secondary | ICD-10-CM | POA: Diagnosis not present

## 2022-08-06 DIAGNOSIS — Z950 Presence of cardiac pacemaker: Secondary | ICD-10-CM | POA: Diagnosis not present

## 2022-08-21 NOTE — Progress Notes (Unsigned)
[facility]   MULTIDISCIPLINARY HEART VALVE CLINIC                                     Cardiology Office Note:    Date:  08/23/2022   ID:  Tony [name], DOB 11-21-1950, MRN [id number]  PCP:  Tony Aspen, MD  [facility] Cardiologist:  Tony Guadalajara, MD  [facility] Electrophysiologist:  Tony Range, MD (Inactive) / Tony. Excell Seltzer, MD and Tony. Laneta Simmers, MD (TAVR)   Referring MD: Tony Laughter, MD   Chief Complaint  Patient presents with   Follow-up    1 year s/p TAVR   History of Present Illness:    Tony [name] is a 72 y.o. 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    Tony [name] had been followed over time for severe AS. Echo last year showed EF 40-45%, with severe asymmetric left ventricular hypertrophy of the basal-septal segment and severe AS with a mean gradient of 25 mm hg and DVI 0.24 as well as an ascending aorta is mildly dilated at 4.1 cm and mild to moderate mitral valve regurgitation. L/RHC on 08/07/21 showed patent coronary arteries with mild diffuse nonobstructive plaquing noted. He reported having exertional shortness of breath with walking longer distances or up hills and occasional episodes of dizziness when he sits up from laying down.    He was evaluated by the multidisciplinary valve team and underwent successful TAVR with a 29 mm Edwards Sapien 3 Ultra Resilia THV via the TF approach on 08/29/21. Post operative echo showed EF 40-45%, normally functioning TAVR with a mean gradient of 7 mmHg and np PVL as well as moderate MR. He was discharged on aspirin alone. He has done quite well in follow up. 1 month echo 09/27/21 showed EF 40-45%, moderate LVH, normally functioning TAVR with a mean gradient of 12 mmHg and no PVL as well as mild MR and small pericardial  effusion (old). He was seen in the office that day and doing well.    He was then seen back on 6/14 by Tony Hoof NP for EP follow up. He reported feeling weak and lightheaded. Coreg had been held. He was asked to hold Entresto. In follow up on 10/27/21 he was doing better with resolution of dizziness. He was started back on his Entresto at half dose and was back on his Coreg.    He was admitted last November with stroke like symptoms of upper extremity and facial numbness. He was brought to the ED an CT imaging did not show acute stroke. MRI was incompatible with PCM. He was seen by neurology and was started on Plavix in additon to ASA x 3 weeks then Plavix alone. Given that his daughter is an anesthesiologist, he was discharged home.   Since that time he has been doing well. He states that several weeks ago, he did have a mix up with his medications and he became very dehydrated. He had run out of several medications and was unsure what he was taking. His daughter has bene helping him get back on track. He does report that he has not been taking his Entresto. Echo today shows a slight decrease in EF to  30-35% although he remains asymptomatic. He denies chest pain, palpitations, SOB, LE edema, orthopnea, PND, dizziness, bleeding, or syncope.   Past Medical History:  Diagnosis Date   Cancer    thyroid   CHF (congestive heart failure)    Complete heart block    a. s/p MDT dual chamber PPM followed by Tony [name]    Complication of anesthesia    1985 after Thyriodectomy hard time waking up   Diabetes mellitus    GERD (gastroesophageal reflux disease)    Hypertension    Hypothyroidism    Had two surgeries for Cancer   Presence of permanent cardiac pacemaker    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   s/p TAVR with a 29 mm Edwards S3UR via the TF approach by Tony. Excell [name] and Tony. Laneta [name]   Severe aortic stenosis    Sleep apnea     Past Surgical History:  Procedure Laterality  Date   CARDIAC CATHETERIZATION     INTRAOPERATIVE TRANSTHORACIC ECHOCARDIOGRAM N/A 08/29/2021   Procedure: INTRAOPERATIVE TRANSTHORACIC ECHOCARDIOGRAM;  Surgeon: Tony Bollman, MD;  Location: [facility] INVASIVE CV LAB;  Service: Open Heart Surgery;  Laterality: N/A;   LUMBAR LAMINECTOMY/DECOMPRESSION MICRODISCECTOMY  07/19/2011   Procedure: LUMBAR LAMINECTOMY/DECOMPRESSION MICRODISCECTOMY 3 LEVELS;  Surgeon: Tony Lick, MD;  Location: [facility] OR;  Service: Orthopedics;  Laterality: Left;  Lumbar three-Lumbar five LEFT DECOMPRESSION AND FORAMINOTOMY Lumbar three-four LEFT DISCECTOMY   PERMANENT PACEMAKER INSERTION N/A 08/11/2014   MDT Adapta L implanted by Tony [name] for CHB   RIGHT HEART CATH AND CORONARY ANGIOGRAPHY N/A 08/07/2021   Procedure: RIGHT HEART CATH AND CORONARY ANGIOGRAPHY;  Surgeon: Tony Bollman, MD;  Location: [facility] INVASIVE CV LAB;  Service: Cardiovascular;  Laterality: N/A;   RIGHT/LEFT HEART CATH AND CORONARY ANGIOGRAPHY N/A 08/07/2021   Procedure: RIGHT/LEFT HEART CATH AND CORONARY ANGIOGRAPHY;  Surgeon: Tony Bollman, MD;  Location: [facility] INVASIVE CV LAB;  Service: Cardiovascular;  Laterality: N/A;   Thyroidectomy x2     TRANSCATHETER AORTIC VALVE REPLACEMENT, TRANSFEMORAL N/A 08/29/2021   Procedure: Transcatheter Aortic Valve Replacement, Transfemoral;  Surgeon: Tony Bollman, MD;  Location: [facility] INVASIVE CV LAB;  Service: Open Heart Surgery;  Laterality: N/A;    Current Medications: Current Meds  Medication Sig   acetaminophen (TYLENOL) 500 MG tablet Take 500 mg by mouth every 6 (six) hours as needed for moderate pain (pain).   amoxicillin (AMOXIL) 500 MG tablet Take 4 tablets (2,000 mg total) by mouth as directed. 1 hour prior to dental work including cleanings   atorvastatin (LIPITOR) 40 MG tablet Take 40 mg by mouth daily.   BD PEN NEEDLE NANO 2ND GEN 32G X 4 MM MISC    canagliflozin (INVOKANA) 300 MG TABS tablet Take 300 mg by mouth daily before breakfast.   carvedilol  (COREG) 3.125 MG tablet Take 1 tablet (3.125 mg total) by mouth 2 (two) times daily with a meal. (Patient taking differently: Take 3.125 mg by mouth in the morning and at bedtime. 2 TABLETS, TWICE DAILY)   clopidogrel (PLAVIX) 75 MG tablet Take 1 tablet (75 mg total) by mouth daily.   Continuous Blood Gluc Receiver (FREESTYLE LIBRE 2 READER) DEVI by Does not apply route.   cyclobenzaprine (FLEXERIL) 10 MG tablet Take 10 mg by mouth 3 (three) times daily as needed for muscle spasms.   Dulaglutide 4.5 MG/0.5ML SOPN Inject 4.5 mg into the skin every Friday.   famotidine (PEPCID) 20 MG tablet Take 20 mg by mouth daily.   gabapentin (NEURONTIN) 100 MG  capsule Take 2 capsules (200 mg total) by mouth at bedtime. (Patient taking differently: Take 100 mg by mouth at bedtime.)   hydrALAZINE (APRESOLINE) 25 MG tablet Take 1 tablet (25 mg total) by mouth 2 (two) times daily.   Insulin Aspart FlexPen (NOVOLOG) 100 UNIT/ML Inject 5 Units into the skin daily.   insulin glargine (LANTUS SOLOSTAR) 100 UNIT/ML Solostar Pen Inject 40 Units into the skin 2 (two) times daily.   Lancets (ONETOUCH DELICA PLUS LANCET33G) MISC Apply topically.   levothyroxine (SYNTHROID) 175 MCG tablet Take 175 mcg by mouth daily before breakfast.   omeprazole (PRILOSEC) 10 MG capsule Take 10 mg by mouth daily.   sertraline (ZOLOFT) 100 MG tablet Take 200 mg by mouth daily.    Tamsulosin HCl (FLOMAX) 0.4 MG CAPS Take 1 capsule (0.4 mg total) by mouth every morning.   [DISCONTINUED] sacubitril-valsartan (ENTRESTO) 24-26 MG Take 1 tablet by mouth daily.     Allergies:   Corticosteroids, Codeine, Crestor [rosuvastatin calcium], Metformin hcl, and Simvastatin   Social History   Socioeconomic History   Marital status: Widowed    Spouse name: jody   Number of children: 2   Years of education: college   Highest education level: Master's degree (e.g., MA, MS, MEng, MEd, MSW, MBA)  Occupational History   Occupation: retired  Tobacco Use    Smoking status: Never   Smokeless tobacco: Never  Vaping Use   Vaping Use: Never used  Substance and Sexual Activity   Alcohol use: No    Comment: occasional glass of wine   Drug use: No   Sexual activity: Not on file  Other Topics Concern   Not on file  Social History Narrative   Not on file   Social Determinants of Health   Financial Resource Strain: Not on file  Food Insecurity: No Food Insecurity (03/22/2022)   Hunger Vital Sign    Worried About Running Out of Food in the Last Year: Never true    Ran Out of Food in the Last Year: Never true  Transportation Needs: No Transportation Needs (03/22/2022)   PRAPARE - Administrator, Civil Service (Medical): No    Lack of Transportation (Non-Medical): No  Physical Activity: Not on file  Stress: Not on file  Social Connections: Not on file     Family History: The patient's family history includes Healthy in his daughter, sister, and son; Heart attack in his father; Heart disease in his father; Lung cancer in his mother. There is no history of Anesthesia problems, Hypotension, or Pseudochol deficiency.  ROS:   Please see the history of present illness.    All other systems reviewed and are negative.  EKGs/Labs/Other Studies Reviewed:    The following studies were reviewed today:  Echocardiogram 08/22/22:   1. Akinesis of the septum, distal inferior wall and apex; overall  moderate to severe LV dysfunction; s/p TAVR with mean gradient 4 mmHg, DI  0.53 and no AI; compared to 03/23/22 LV function is worse and pericardial  effusion slightly larger.   2. Left ventricular ejection fraction, by estimation, is 30 to 35%. The  left ventricle has moderate to severely decreased function. The left  ventricle demonstrates regional wall motion abnormalities (see scoring  diagram/findings for description). There  is mild concentric left ventricular hypertrophy. Left ventricular  diastolic parameters are consistent with  Grade I diastolic dysfunction  (impaired relaxation). Elevated left atrial pressure.   3. Right ventricular systolic function is normal. The right ventricular  size is normal.   4. Left atrial size was mildly dilated.   5. A small pericardial effusion is present.   6. The mitral valve is normal in structure. Mild mitral valve  regurgitation. No evidence of mitral stenosis.   7. The aortic valve has been repaired/replaced. Aortic valve  regurgitation is not visualized. No aortic stenosis is present. There is a  29 mm Sapien prosthetic (TAVR) valve present in the aortic position.  Procedure Date: 08/29/2021. Echo findings are  consistent with normal structure and function of the aortic valve  prosthesis.   8. Aortic dilatation noted. There is mild dilatation of the ascending  aorta, measuring 40 mm.     TAVR OPERATIVE NOTE     Date of Procedure:                08/29/2021   Preoperative Diagnosis:      Severe Aortic Stenosis    Postoperative Diagnosis:    Same    Procedure:        Transcatheter Aortic Valve Replacement - Percutaneous Right Transfemoral Approach             Edwards Sapien 3 Ultra Resilia THV (size 29 mm, model # 9755RSL, serial # [id number])              Co-Surgeons:                        Alleen Borne, MD and Tony Bollman, MD       Anesthesiologist:                  Shona Simpson, MD   Echocardiographer:              Charlton Haws, Md   Pre-operative Echo Findings: Severe aortic stenosis moderate left ventricular systolic dysfunction   Post-operative Echo Findings: No paravalvular leak Improved left ventricular systolic function   _____________     Echo 08/30/21:  IMPRESSIONS   1. Global hypokinesis abnormal septam motion septal and apical  hypokinesis . Left ventricular ejection fraction, by estimation, is 40 to  45%. The left ventricle has mildly decreased function. The left ventricle  demonstrates global hypokinesis. The left  ventricular internal  cavity size was moderately dilated. There is severe  left ventricular hypertrophy. Left ventricular diastolic parameters are  consistent with Grade I diastolic dysfunction (impaired relaxation).   2. Pacing wires RA/RV. Right ventricular systolic function is normal. The  right ventricular size is normal.   3. Left atrial size was severely dilated.   4. The pericardial effusion is posterior to the left ventricle and  lateral to the left ventricle.   5. The mitral valve is abnormal. Moderate mitral valve regurgitation. No  evidence of mitral stenosis.   6. Post TAVR with 29 mm Sapien 3 valve mean gradient 7 peak 13 mmHg no  significant PVL . The aortic valve is normal in structure. Aortic valve  regurgitation is not visualized. No aortic stenosis is present. There is a  29 mm Sapien prosthetic (TAVR)  valve present in the aortic position. Procedure Date: 08/29/2021.   7. Aortic dilatation noted. There is mild dilatation of the ascending  aorta, measuring 40 mm.   8. The inferior vena cava is normal in size with greater than 50%  respiratory variability, suggesting right atrial pressure of 3 mmHg.    ___________________     Echo 09/27/21 IMPRESSIONS  1. Left ventricular ejection fraction, by estimation, is  40 to 45%. The left ventricle has mildly decreased function. The left ventricle demonstrates regional wall motion abnormalities. Septal/apical hypokinesis. There is moderate left ventricular  hypertrophy. Left ventricular diastolic parameters are consistent with Grade II diastolic dysfunction (pseudonormalization). Elevated left atrial pressure.  2. Right ventricular systolic function is normal. The right ventricular size is mildly enlarged. There is normal pulmonary artery systolic pressure. The estimated right ventricular systolic pressure is 28.6 mmHg.  3. Left atrial size was mildly dilated.  4. A small pericardial effusion is present.  5. The mitral valve is normal in structure. Mild  mitral valve regurgitation. No evidence of mitral stenosis.  6. There is mild dilatation of the ascending aorta, measuring 39 mm.  7. The inferior vena cava is normal in size with greater than 50% respiratory variability, suggesting right atrial pressure of 3 mmHg.  8. There is a 29 mm Edwards Sapien 3 prosthetic (TAVR) valve present in the aortic position. Procedure Date: 08/29/2021. Echo findings are consistent with normal structure and function of the aortic valve prosthesis. Trivial aortic regurgitation. Vmax 2.4  m/s, MG 12 mmHg, EOA 2.3 cm^2, DI 0.46  EKG:  EKG is not ordered today.    Recent Labs: 08/30/2021: Magnesium 1.6 03/22/2022: ALT 29; TSH 3.342 08/22/2022: BUN 15; Creatinine, Ser 1.40; Hemoglobin 14.4; Platelets 176; Potassium 3.9; Sodium 141   Recent Lipid Panel    Component Value Date/Time   CHOL 141 03/23/2022 0253   TRIG 152 (H) 03/23/2022 0253   HDL 36 (L) 03/23/2022 0253   CHOLHDL 3.9 03/23/2022 0253   VLDL 30 03/23/2022 0253   LDLCALC 75 03/23/2022 0253    Physical Exam:    VS:  BP (!) 132/92   Pulse 86   Ht 6' (1.829 m)   Wt 246 lb (111.6 kg)   SpO2 98%   BMI 33.36 kg/m     Wt Readings from Last 3 Encounters:  08/22/22 246 lb (111.6 kg)  05/31/22 239 lb (108.4 kg)  03/22/22 244 lb 0.8 oz (110.7 kg)    General: Well developed, well nourished, NAD Lungs:Clear to ausculation bilaterally. No wheezes, rales, or rhonchi. Breathing is unlabored. Cardiovascular: RRR with S1 S2. + soft murmurs Extremities: No edema.  Neuro: Alert and oriented. No focal deficits. No facial asymmetry. MAE spontaneously. Psych: Responds to questions appropriately with normal affect.    ASSESSMENT/PLAN:    Severe AS s/p TAVR: Patient doing well with NYHA class I symptoms s/p TAVR. Echo today with decreased EF since last evaluation, now at 30-35% with akinesis of the septum, distal inferior wall and apex. R/LHC prior to TAVR with mild diffuse plaquing but no obstructive CAD.  Reports he has not been taking his Entresto. See plan below. Mean gradient 4 mmHg, DI 0.53 and no AI. Continue Plavix monotherapy given recent CVA.  Continue SBE prophylaxis with amoxicillin. Plan to restart Entresto 24/26 today and I will see him back in 2 weeks for further titration. He sees Tony. Gala Romney next month. BMET, CBC today.   Cardiomyopathy: Echo today with worsening cardiomyopathy with LVEF at 30-35% with akinesis of the septum, distal inferior wall and apex. R/LHC prior to TAVR with mild diffuse plaquing but no obstructive CAD. Reports he has not been taking his Entresto. Will restart today at 24/26 dosing then follow in 2 weeks for possible up-titration. Appears euvolemic on exam today.   Recent CVA: Admitted 02/2022 for acute CVA. CT negative and no MRI performed given PCM. Neurology consulted and Plavix added to regimen.  No new symptoms. Continue Plavix, statin   S/p PPM: Followed by Tony [name] and Tony. Johney [name]    HTN: Stable today. Decrease hydralazine from QID to BID dosing which will also allow for greater BP for Entresto titration.    Medication Adjustments/Labs and Tests Ordered: Current medicines are reviewed at length with the patient today.  Concerns regarding medicines are outlined above.  Orders Placed This Encounter  Procedures   Basic metabolic panel   CBC   Meds ordered this encounter  Medications   hydrALAZINE (APRESOLINE) 25 MG tablet    Sig: Take 1 tablet (25 mg total) by mouth 2 (two) times daily.    Dispense:  180 tablet    Refill:  3   sacubitril-valsartan (ENTRESTO) 24-26 MG    Sig: Take 1 tablet by mouth daily.    Dispense:  60 tablet    Refill:  0    Patient Instructions  Medication Instructions:  Your physician has recommended you make the following change in your medication:   DECREASE HYDRALAZINE 25 MG TWICE DAILY.   *If you need a refill on your cardiac medications before your next appointment, please call your pharmacy*   Lab  Work: TODAY: BMET, CBC If you have labs (blood work) drawn today and your tests are completely normal, you will receive your results only by: MyChart Message (if you have MyChart) OR A paper copy in the mail If you have any lab test that is abnormal or we need to change your treatment, we will call you to review the results.   Testing/Procedures: NONE   Follow-Up: At [facility], you and your health needs are our priority.  As part of our continuing mission to provide you with exceptional heart care, we have created designated Provider Care Teams.  These Care Teams include your primary Cardiologist (physician) and Advanced Practice Providers (APPs -  Physician Assistants and Nurse Practitioners) who all work together to provide you with the care you need, when you need it.  We recommend signing up for the patient portal called "MyChart".  Sign up information is provided on this After Visit Summary.  MyChart is used to connect with patients for Virtual Visits (Telemedicine).  Patients are able to view lab/test results, encounter notes, upcoming appointments, etc.  Non-urgent messages can be sent to your provider as well.   To learn more about what you can do with MyChart, go to [email or web address].    Your next appointment:   KEEP SCHEDULED FOLLOW-UP   Signed, Bradon Fester, NP  08/23/2022 1:36 PM    [facility]

## 2022-08-22 ENCOUNTER — Other Ambulatory Visit (HOSPITAL_COMMUNITY): Payer: Medicare PPO

## 2022-08-22 ENCOUNTER — Ambulatory Visit: Payer: Medicare PPO | Attending: Cardiology | Admitting: Cardiology

## 2022-08-22 ENCOUNTER — Ambulatory Visit (HOSPITAL_BASED_OUTPATIENT_CLINIC_OR_DEPARTMENT_OTHER): Payer: Medicare PPO

## 2022-08-22 VITALS — BP 132/92 | HR 86 | Ht 72.0 in | Wt 246.0 lb

## 2022-08-22 DIAGNOSIS — I5042 Chronic combined systolic (congestive) and diastolic (congestive) heart failure: Secondary | ICD-10-CM

## 2022-08-22 DIAGNOSIS — I5022 Chronic systolic (congestive) heart failure: Secondary | ICD-10-CM

## 2022-08-22 DIAGNOSIS — Z950 Presence of cardiac pacemaker: Secondary | ICD-10-CM

## 2022-08-22 DIAGNOSIS — I10 Essential (primary) hypertension: Secondary | ICD-10-CM

## 2022-08-22 DIAGNOSIS — I350 Nonrheumatic aortic (valve) stenosis: Secondary | ICD-10-CM

## 2022-08-22 DIAGNOSIS — Z79899 Other long term (current) drug therapy: Secondary | ICD-10-CM | POA: Diagnosis not present

## 2022-08-22 DIAGNOSIS — Z952 Presence of prosthetic heart valve: Secondary | ICD-10-CM

## 2022-08-22 LAB — ECHOCARDIOGRAM COMPLETE
AR max vel: 1.43 cm2
AV Area VTI: 1.67 cm2
AV Area mean vel: 1.65 cm2
AV Mean grad: 4 mmHg
AV Peak grad: 8.5 mmHg
Ao pk vel: 1.46 m/s
Area-P 1/2: 8.52 cm2
MV M vel: 4.56 m/s
MV Peak grad: 83.2 mmHg
S' Lateral: 3 cm

## 2022-08-22 MED ORDER — SACUBITRIL-VALSARTAN 24-26 MG PO TABS: 1.0000 | ORAL_TABLET | Freq: Every day | ORAL | 0 refills | Status: AC

## 2022-08-22 MED ORDER — HYDRALAZINE HCL 25 MG PO TABS: 25.0000 mg | ORAL_TABLET | Freq: Two times a day (BID) | ORAL | 3 refills | Status: DC

## 2022-08-22 NOTE — Patient Instructions (Signed)
Medication Instructions:  Your physician has recommended you make the following change in your medication:   DECREASE HYDRALAZINE 25 MG TWICE DAILY.   *If you need a refill on your cardiac medications before your next appointment, please call your pharmacy*   Lab Work: TODAY: BMET, CBC If you have labs (blood work) drawn today and your tests are completely normal, you will receive your results only by: MyChart Message (if you have MyChart) OR A paper copy in the mail If you have any lab test that is abnormal or we need to change your treatment, we will call you to review the results.   Testing/Procedures: NONE   Follow-Up: At [facility], you and your health needs are our priority.  As part of our continuing mission to provide you with exceptional heart care, we have created designated Provider Care Teams.  These Care Teams include your primary Cardiologist (physician) and Advanced Practice Providers (APPs -  Physician Assistants and Nurse Practitioners) who all work together to provide you with the care you need, when you need it.  We recommend signing up for the patient portal called "MyChart".  Sign up information is provided on this After Visit Summary.  MyChart is used to connect with patients for Virtual Visits (Telemedicine).  Patients are able to view lab/test results, encounter notes, upcoming appointments, etc.  Non-urgent messages can be sent to your provider as well.   To learn more about what you can do with MyChart, go to [email or web address].    Your next appointment:   KEEP SCHEDULED FOLLOW-UP

## 2022-08-23 ENCOUNTER — Other Ambulatory Visit (HOSPITAL_COMMUNITY): Payer: Medicare PPO

## 2022-08-23 LAB — BASIC METABOLIC PANEL
BUN/Creatinine Ratio: 11 (ref 10–24)
BUN: 15 mg/dL (ref 8–27)
CO2: 25 mmol/L (ref 20–29)
Calcium: 9 mg/dL (ref 8.6–10.2)
Chloride: 102 mmol/L (ref 96–106)
Creatinine, Ser: 1.4 mg/dL — ABNORMAL HIGH (ref 0.76–1.27)
Glucose: 151 mg/dL — ABNORMAL HIGH (ref 70–99)
Potassium: 3.9 mmol/L (ref 3.5–5.2)
Sodium: 141 mmol/L (ref 134–144)
eGFR: 53 mL/min/{1.73_m2} — ABNORMAL LOW (ref >59)

## 2022-08-23 LAB — CBC
Hematocrit: 43.6 % (ref 37.5–51.0)
Hemoglobin: 14.4 g/dL (ref 13.0–17.7)
MCH: 29.3 pg (ref 26.6–33.0)
MCHC: 33 g/dL (ref 31.5–35.7)
MCV: 89 fL (ref 79–97)
Platelets: 176 10*3/uL (ref 150–450)
RBC: 4.91 x10E6/uL (ref 4.14–5.80)
RDW: 15.6 % — ABNORMAL HIGH (ref 11.6–15.4)
WBC: 8.3 10*3/uL (ref 3.4–10.8)

## 2022-08-28 DIAGNOSIS — D631 Anemia in chronic kidney disease: Secondary | ICD-10-CM | POA: Diagnosis not present

## 2022-08-28 DIAGNOSIS — I5022 Chronic systolic (congestive) heart failure: Secondary | ICD-10-CM | POA: Diagnosis not present

## 2022-08-28 DIAGNOSIS — N2581 Secondary hyperparathyroidism of renal origin: Secondary | ICD-10-CM | POA: Diagnosis not present

## 2022-08-28 DIAGNOSIS — N1831 Chronic kidney disease, stage 3a: Secondary | ICD-10-CM | POA: Diagnosis not present

## 2022-08-28 DIAGNOSIS — E785 Hyperlipidemia, unspecified: Secondary | ICD-10-CM | POA: Diagnosis not present

## 2022-08-28 DIAGNOSIS — I130 Hypertensive heart and chronic kidney disease with heart failure and stage 1 through stage 4 chronic kidney disease, or unspecified chronic kidney disease: Secondary | ICD-10-CM | POA: Diagnosis not present

## 2022-08-29 LAB — LAB REPORT - SCANNED
Albumin, Urine POC: 1725
Creatinine, POC: 103.7 mg/dL
EGFR: 50
Microalb Creat Ratio: 1663

## 2022-08-30 ENCOUNTER — Other Ambulatory Visit: Payer: Self-pay | Admitting: Nephrology

## 2022-08-30 DIAGNOSIS — N1831 Chronic kidney disease, stage 3a: Secondary | ICD-10-CM

## 2022-09-03 ENCOUNTER — Ambulatory Visit: Payer: Medicare PPO | Attending: Internal Medicine

## 2022-09-03 NOTE — Progress Notes (Deleted)
[facility]   MULTIDISCIPLINARY HEART VALVE CLINIC                                     Cardiology Office Note:    Date:  09/03/2022   ID:  QUANTAVIOUS [name], DOB February 05, 1951, MRN [id number]  PCP:  Tony Aspen, MD  [facility] Cardiologist:  Nicki Guadalajara, MD  [facility] Electrophysiologist:  Hillis Range, MD (Inactive)   Referring MD: Tony [name], *   No chief complaint on file. ***  History of Present Illness:    Tony [name] is a 72 y.o. 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    [name] had been followed over time for severe AS. Echo last year showed EF 40-45%, with severe asymmetric left ventricular hypertrophy of the basal-septal segment and severe AS with a mean gradient of 25 mm hg and DVI 0.24 as well as an ascending aorta is mildly dilated at 4.1 cm and mild to moderate mitral valve regurgitation. L/RHC on 08/07/21 showed patent coronary arteries with mild diffuse nonobstructive plaquing noted. He reported having exertional shortness of breath with walking longer distances or up hills and occasional episodes of dizziness when he sits up from laying down.    He was evaluated by the multidisciplinary valve team and underwent successful TAVR with a 29 mm Edwards Sapien 3 Ultra Resilia THV via the TF approach on 08/29/21. Post operative echo showed EF 40-45%, normally functioning TAVR with a mean gradient of 7 mmHg and np PVL as well as moderate MR. He was discharged on aspirin alone. He has done quite well in follow up. 1 month echo 09/27/21 showed EF 40-45%, moderate LVH, normally functioning TAVR with a mean gradient of 12 mmHg and no PVL as well as mild MR and small pericardial effusion (old). He was seen in the office that day and doing well.    He was  then seen back on 6/14 by Clementeen Hoof NP for EP follow up. He reported feeling weak and lightheaded. Coreg had been held. He was asked to hold Entresto. In follow up on 10/27/21 he was doing better with resolution of dizziness. He was started back on his Entresto at half dose and was back on his Coreg.    He was admitted last November with stroke like symptoms of upper extremity and facial numbness. He was brought to the ED an CT imaging did not show acute stroke. MRI was incompatible with PCM. He was seen by neurology and was started on Plavix in additon to ASA x 3 weeks then Plavix alone. Given that his daughter is an anesthesiologist, he was discharged home.    Since that time he has been doing well. He states that several weeks ago, he did have a mix up with his medications and he became very dehydrated. He had run out of several medications and was unsure what he was taking. His daughter has bene helping him get back on track. He does report that he has not been taking his Entresto. Echo today shows a slight decrease in EF to 30-35% although he remains asymptomatic. He denies chest pain, palpitations, SOB, LE edema, orthopnea, PND, dizziness, bleeding, or  syncope.     Past Medical History:  Diagnosis Date   Cancer [facility])    thyroid   CHF (congestive heart failure) (HCC)    Complete heart block (HCC)    a. 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 Excell Seltzer and Dr. Laneta Simmers   Severe aortic stenosis    Sleep apnea     Past Surgical History:  Procedure Laterality Date   CARDIAC CATHETERIZATION     INTRAOPERATIVE  TRANSTHORACIC ECHOCARDIOGRAM N/A 08/29/2021   Procedure: INTRAOPERATIVE TRANSTHORACIC ECHOCARDIOGRAM;  Surgeon: Tonny Bollman, MD;  Location: [facility] INVASIVE CV LAB;  Service: Open Heart Surgery;  Laterality: N/A;   LUMBAR LAMINECTOMY/DECOMPRESSION MICRODISCECTOMY  07/19/2011   Procedure: LUMBAR LAMINECTOMY/DECOMPRESSION MICRODISCECTOMY 3 LEVELS;  Surgeon: Venita Lick, MD;  Location: [facility] OR;  Service: Orthopedics;  Laterality: Left;  Lumbar three-Lumbar five LEFT DECOMPRESSION AND FORAMINOTOMY Lumbar three-four LEFT DISCECTOMY   PERMANENT PACEMAKER INSERTION N/A 08/11/2014   MDT Adapta L implanted by Dr Johney Frame for CHB   RIGHT HEART CATH AND CORONARY ANGIOGRAPHY N/A 08/07/2021   Procedure: RIGHT HEART CATH AND CORONARY ANGIOGRAPHY;  Surgeon: Tonny Bollman, MD;  Location: [facility] INVASIVE CV LAB;  Service: Cardiovascular;  Laterality: N/A;   RIGHT/LEFT HEART CATH AND CORONARY ANGIOGRAPHY N/A 08/07/2021   Procedure: RIGHT/LEFT HEART CATH AND CORONARY ANGIOGRAPHY;  Surgeon: Tonny Bollman, MD;  Location: [facility] INVASIVE CV LAB;  Service: Cardiovascular;  Laterality: N/A;   Thyroidectomy x2     TRANSCATHETER AORTIC VALVE REPLACEMENT, TRANSFEMORAL N/A 08/29/2021   Procedure: Transcatheter Aortic Valve Replacement, Transfemoral;  Surgeon: Tonny Bollman, MD;  Location: [facility] INVASIVE CV LAB;  Service: Open Heart Surgery;  Laterality: N/A;    Current Medications: No outpatient medications have been marked as taking for the 09/03/22 encounter (Appointment) with [facility].     Allergies:   Corticosteroids, Codeine, Crestor [rosuvastatin calcium], Metformin hcl, and Simvastatin   Social History   Socioeconomic History   Marital status: Widowed    Spouse name: jody   Number of children: 2   Years of education: college   Highest education level: Master's degree (e.g., MA, MS, MEng, MEd, MSW, MBA)  Occupational History   Occupation: retired  Tobacco Use   Smoking status: Never    Smokeless tobacco: Never  Vaping Use   Vaping Use: Never used  Substance and Sexual Activity   Alcohol use: No    Comment: occasional glass of wine   Drug use: No   Sexual activity: Not on file  Other Topics Concern   Not on file  Social History Narrative   Not on file   Social Determinants of Health   Financial Resource Strain: Not on file  Food Insecurity: No Food Insecurity (03/22/2022)   Hunger Vital Sign    Worried About Running Out of Food in the Last Year: Never true    Ran Out of Food in the Last Year: Never true  Transportation Needs: No Transportation Needs (03/22/2022)   PRAPARE - Transportation    Lack of Transportation (Medical): No    Lack  of Transportation (Non-Medical): No  Physical Activity: Not on file  Stress: Not on file  Social Connections: Not on file     Family History: The patient's ***family history includes Healthy in his daughter, sister, and son; Heart attack in his father; Heart disease in his father; Lung cancer in his mother. There is no history of Anesthesia problems, Hypotension, or Pseudochol deficiency.  ROS:   Please see the history of present illness.    All other systems reviewed and are negative.  EKGs/Labs/Other Studies Reviewed:    The following studies were reviewed today:   Echocardiogram 08/22/22:    1. Akinesis of the septum, distal inferior wall and apex; overall  moderate to severe LV dysfunction; s/p TAVR with mean gradient 4 mmHg, DI  0.53 and no AI; compared to 03/23/22 LV function is worse and pericardial  effusion slightly larger.   2. Left ventricular ejection fraction, by estimation, is 30 to 35%. The  left ventricle has moderate to severely decreased function. The left  ventricle demonstrates regional wall motion abnormalities (see scoring  diagram/findings for description). There  is mild concentric left ventricular hypertrophy. Left ventricular  diastolic parameters are consistent with Grade I diastolic  dysfunction  (impaired relaxation). Elevated left atrial pressure.   3. Right ventricular systolic function is normal. The right ventricular  size is normal.   4. Left atrial size was mildly dilated.   5. A small pericardial effusion is present.   6. The mitral valve is normal in structure. Mild mitral valve  regurgitation. No evidence of mitral stenosis.   7. The aortic valve has been repaired/replaced. Aortic valve  regurgitation is not visualized. No aortic stenosis is present. There is a  29 mm Sapien prosthetic (TAVR) valve present in the aortic position.  Procedure Date: 08/29/2021. Echo findings are  consistent with normal structure and function of the aortic valve  prosthesis.   8. Aortic dilatation noted. There is mild dilatation of the ascending  aorta, measuring 40 mm.     TAVR OPERATIVE NOTE     Date of Procedure:                08/29/2021   Preoperative Diagnosis:      Severe Aortic Stenosis    Postoperative Diagnosis:    Same    Procedure:        Transcatheter Aortic Valve Replacement - Percutaneous Right Transfemoral Approach             Edwards Sapien 3 Ultra Resilia THV (size 29 mm, model # 9755RSL, serial # [id number])              Co-Surgeons:                        Alleen Borne, MD and Tonny Bollman, MD       Anesthesiologist:                  Shona Simpson, MD   Echocardiographer:              Charlton Haws, Md   Pre-operative Echo Findings: Severe aortic stenosis moderate left ventricular systolic dysfunction   Post-operative Echo Findings: No paravalvular leak Improved left ventricular systolic function   _____________     Echo 08/30/21:  IMPRESSIONS   1. Global hypokinesis abnormal septam motion septal and apical  hypokinesis . Left ventricular ejection fraction, by estimation, is 40 to  45%. The left ventricle has mildly decreased  function. The left ventricle  demonstrates global hypokinesis. The left  ventricular internal cavity size was  moderately dilated. There is severe  left ventricular hypertrophy. Left ventricular diastolic parameters are  consistent with Grade I diastolic dysfunction (impaired relaxation).   2. Pacing wires RA/RV. Right ventricular systolic function is normal. The  right ventricular size is normal.   3. Left atrial size was severely dilated.   4. The pericardial effusion is posterior to the left ventricle and  lateral to the left ventricle.   5. The mitral valve is abnormal. Moderate mitral valve regurgitation. No  evidence of mitral stenosis.   6. Post TAVR with 29 mm Sapien 3 valve mean gradient 7 peak 13 mmHg no  significant PVL . The aortic valve is normal in structure. Aortic valve  regurgitation is not visualized. No aortic stenosis is present. There is a  29 mm Sapien prosthetic (TAVR)  valve present in the aortic position. Procedure Date: 08/29/2021.   7. Aortic dilatation noted. There is mild dilatation of the ascending  aorta, measuring 40 mm.   8. The inferior vena cava is normal in size with greater than 50%  respiratory variability, suggesting right atrial pressure of 3 mmHg.    ___________________     Echo 09/27/21 IMPRESSIONS  1. Left ventricular ejection fraction, by estimation, is 40 to 45%. The left ventricle has mildly decreased function. The left ventricle demonstrates regional wall motion abnormalities. Septal/apical hypokinesis. There is moderate left ventricular  hypertrophy. Left ventricular diastolic parameters are consistent with Grade II diastolic dysfunction (pseudonormalization). Elevated left atrial pressure.  2. Right ventricular systolic function is normal. The right ventricular size is mildly enlarged. There is normal pulmonary artery systolic pressure. The estimated right ventricular systolic pressure is 28.6 mmHg.  3. Left atrial size was mildly dilated.  4. A small pericardial effusion is present.  5. The mitral valve is normal in structure. Mild mitral valve  regurgitation. No evidence of mitral stenosis.  6. There is mild dilatation of the ascending aorta, measuring 39 mm.  7. The inferior vena cava is normal in size with greater than 50% respiratory variability, suggesting right atrial pressure of 3 mmHg.  8. There is a 29 mm Edwards Sapien 3 prosthetic (TAVR) valve present in the aortic position. Procedure Date: 08/29/2021. Echo findings are consistent with normal structure and function of the aortic valve prosthesis. Trivial aortic regurgitation. Vmax 2.4  m/s, MG 12 mmHg, EOA 2.3 cm^2, DI 0.46    EKG:  EKG is *** ordered today.  The ekg ordered today demonstrates ***  Recent Labs: 03/22/2022: ALT 29; TSH 3.342 08/22/2022: BUN 15; Creatinine, Ser 1.40; Hemoglobin 14.4; Platelets 176; Potassium 3.9; Sodium 141  Recent Lipid Panel    Component Value Date/Time   CHOL 141 03/23/2022 0253   TRIG 152 (H) 03/23/2022 0253   HDL 36 (L) 03/23/2022 0253   CHOLHDL 3.9 03/23/2022 0253   VLDL 30 03/23/2022 0253   LDLCALC 75 03/23/2022 0253     Risk Assessment/Calculations:   {Does this patient have ATRIAL FIBRILLATION?:[phone]}   Physical Exam:    VS:  There were no vitals taken for this visit.    Wt Readings from Last 3 Encounters:  08/22/22 246 lb (111.6 kg)  05/31/22 239 lb (108.4 kg)  03/22/22 244 lb 0.8 oz (110.7 kg)     GEN: *** Well nourished, well developed in no acute distress HEENT: Normal NECK: No JVD; No carotid bruits LYMPHATICS: No lymphadenopathy CARDIAC: ***RRR, no murmurs, rubs,  gallops RESPIRATORY:  Clear to auscultation without rales, wheezing or rhonchi  ABDOMEN: Soft, non-tender, non-distended MUSCULOSKELETAL:  No edema; No deformity  SKIN: Warm and dry NEUROLOGIC:  Alert and oriented x 3 PSYCHIATRIC:  Normal affect   ASSESSMENT:    No diagnosis found. PLAN:    In order of problems listed above:  Severe AS s/p TAVR: Patient doing well with NYHA class I symptoms s/p TAVR. Echo today with decreased EF  since last evaluation, now at 30-35% with akinesis of the septum, distal inferior wall and apex. R/LHC prior to TAVR with mild diffuse plaquing but no obstructive CAD. Reports he has not been taking his Entresto. See plan below. Mean gradient 4 mmHg, DI 0.53 and no AI. Continue Plavix monotherapy given recent CVA.  Continue SBE prophylaxis with amoxicillin. Plan to restart Entresto 24/26 today and I will see him back in 2 weeks for further titration. He sees Dr. Gala Romney next month. BMET, CBC today.    Cardiomyopathy: Echo today with worsening cardiomyopathy with LVEF at 30-35% with akinesis of the septum, distal inferior wall and apex. R/LHC prior to TAVR with mild diffuse plaquing but no obstructive CAD. Reports he has not been taking his Entresto. Will restart today at 24/26 dosing then follow in 2 weeks for possible up-titration. Appears euvolemic on exam today.    Recent CVA: Admitted 02/2022 for acute CVA. CT negative and no MRI performed given PCM. Neurology consulted and Plavix added to regimen. No new symptoms. Continue Plavix, statin   S/p PPM: Followed by Clementeen Hoof and Dr. Johney Frame    HTN: Stable today. Decrease hydralazine from QID to BID dosing which will also allow for greater BP for Entresto titration.        {Are you ordering a CV Procedure (e.g. stress test, cath, DCCV, TEE, etc)?   Press F2        :[id number]}    Medication Adjustments/Labs and Tests Ordered: Current medicines are reviewed at length with the patient today.  Concerns regarding medicines are outlined above.  No orders of the defined types were placed in this encounter.  No orders of the defined types were placed in this encounter.   There are no Patient Instructions on file for this visit.   Signed, Georgie Chard, NP  09/03/2022 8:42 AM    [facility]

## 2022-09-04 ENCOUNTER — Encounter: Payer: Self-pay | Admitting: Cardiology

## 2022-09-06 NOTE — Progress Notes (Signed)
[facility]   MULTIDISCIPLINARY HEART VALVE CLINIC                                     Cardiology Office Note:    Date:  09/07/2022   ID:  Tony [name], DOB 19-Nov-1950, MRN [id number]  PCP:  Emilio Aspen, MD  [name] Cardiologist:  Nicki Guadalajara, MD  [facility] Electrophysiologist:  Hillis Range, MD (Inactive) / Dr. Excell Seltzer, MD and Dr. Laneta Simmers, MD (TAVR)   Referring MD: Eleanora Neighbor A, *   Follow up after restarting Entresto  History of Present Illness:    MOX Tony [name] is a 72 y.o. 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    Mr. Tony [name] had been followed over time for severe AS. Echo last year showed EF 40-45%, with severe asymmetric left ventricular hypertrophy of the basal-septal segment and severe AS with a mean gradient of 25 mm hg and DVI 0.24 as well as an ascending aorta is mildly dilated at 4.1 cm and mild to moderate mitral valve regurgitation. L/RHC on 08/07/21 showed patent coronary arteries with mild diffuse nonobstructive plaquing noted. He reported having exertional shortness of breath with walking longer distances or up hills and occasional episodes of dizziness when he sits up from laying down.    He was evaluated by the multidisciplinary valve team and underwent successful TAVR with a 29 mm Edwards Sapien 3 Ultra Resilia THV via the TF approach on 08/29/21. Post operative echo showed EF 40-45%, normally functioning TAVR with a mean gradient of 7 mmHg and np PVL as well as moderate MR. He was discharged on aspirin alone.1 month echo 09/27/21 showed EF 40-45%, moderate LVH, normally functioning TAVR with a mean gradient of 12 mmHg and no PVL as well as mild MR and small pericardial effusion (old). He had a clinical improvement since TAVR.    He was  then seen back on 6/14 by Clementeen Hoof NP for EP follow up. He reported feeling weak and lightheaded. Coreg had been held. He was asked to hold Entresto. In follow up on 10/27/21 he was doing better with resolution of dizziness. He was started back on his Entresto at half dose and was back on his Coreg.    He was admitted 02/2022 with stroke like symptoms of upper extremity and facial numbness. He was brought to the ED an CT imaging did not show acute stroke. MRI was incompatible with PCM. He was seen by neurology and was started on Plavix in additon to ASA x 3 weeks then Plavix alone. Given that his daughter is an anesthesiologist, he was discharged home.   Recently seen back for 1 year follow up s/p TAVR. Echo showed EF 30-35%, normally functioning TAVR with a mean gradient of 4 mm hg and no PVL as well as small pericardial effusion that was slightly larger. Thankfully, he was clinically stable. He reported having stopped his Entresto. This was added back at 24-26mg  BID and hydralazine 25mg  TID decreased to BID.   Today the patient presents to clinic for follow up. Tolerating Entresto well. Doesn't know if he taking coreg or hydralazine. No CP or SOB. No LE edema, orthopnea or PND. No dizziness  or syncope. No blood in stool or urine. No palpitations.     Past Medical History:  Diagnosis Date   Cancer [facility])    thyroid   CHF (congestive heart failure) (HCC)    Complete heart block (HCC)    a. 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 Excell Seltzer and Dr. Laneta Simmers   Severe aortic stenosis    Sleep apnea     Past Surgical  History:  Procedure Laterality Date   CARDIAC CATHETERIZATION     INTRAOPERATIVE TRANSTHORACIC ECHOCARDIOGRAM N/A 08/29/2021   Procedure: INTRAOPERATIVE TRANSTHORACIC ECHOCARDIOGRAM;  Surgeon: Tonny Bollman, MD;  Location: [facility] INVASIVE CV LAB;  Service: Open Heart Surgery;  Laterality: N/A;   LUMBAR LAMINECTOMY/DECOMPRESSION MICRODISCECTOMY  07/19/2011   Procedure: LUMBAR LAMINECTOMY/DECOMPRESSION MICRODISCECTOMY 3 LEVELS;  Surgeon: Venita Lick, MD;  Location: [facility] OR;  Service: Orthopedics;  Laterality: Left;  Lumbar three-Lumbar five LEFT DECOMPRESSION AND FORAMINOTOMY Lumbar three-four LEFT DISCECTOMY   PERMANENT PACEMAKER INSERTION N/A 08/11/2014   MDT Adapta L implanted by Dr Johney Frame for CHB   RIGHT HEART CATH AND CORONARY ANGIOGRAPHY N/A 08/07/2021   Procedure: RIGHT HEART CATH AND CORONARY ANGIOGRAPHY;  Surgeon: Tonny Bollman, MD;  Location: [facility] INVASIVE CV LAB;  Service: Cardiovascular;  Laterality: N/A;   RIGHT/LEFT HEART CATH AND CORONARY ANGIOGRAPHY N/A 08/07/2021   Procedure: RIGHT/LEFT HEART CATH AND CORONARY ANGIOGRAPHY;  Surgeon: Tonny Bollman, MD;  Location: [facility] INVASIVE CV LAB;  Service: Cardiovascular;  Laterality: N/A;   Thyroidectomy x2     TRANSCATHETER AORTIC VALVE REPLACEMENT, TRANSFEMORAL N/A 08/29/2021   Procedure: Transcatheter Aortic Valve Replacement, Transfemoral;  Surgeon: Tonny Bollman, MD;  Location: [facility] INVASIVE CV LAB;  Service: Open Heart Surgery;  Laterality: N/A;    Current Medications: Current Meds  Medication Sig   acetaminophen (TYLENOL) 500 MG tablet Take 500 mg by mouth every 6 (six) hours as needed for moderate pain (pain).   amoxicillin (AMOXIL) 500 MG tablet Take 4 tablets (2,000 mg total) by mouth as directed. 1 hour prior to dental work including cleanings   atorvastatin (LIPITOR) 40 MG tablet Take 40 mg by mouth daily.   BD PEN NEEDLE NANO 2ND GEN 32G X 4 MM MISC    canagliflozin (INVOKANA) 300 MG TABS tablet Take 300 mg by mouth daily  before breakfast.   carvedilol (COREG) 3.125 MG tablet Take 1 tablet (3.125 mg total) by mouth 2 (two) times daily with a meal.   clopidogrel (PLAVIX) 75 MG tablet Take 1 tablet (75 mg total) by mouth daily.   Continuous Blood Gluc Receiver (FREESTYLE LIBRE 2 READER) DEVI by Does not apply route.   cyclobenzaprine (FLEXERIL) 10 MG tablet Take 10 mg by mouth 3 (three) times daily as needed for muscle spasms.   famotidine (PEPCID) 20 MG tablet Take 20 mg by mouth daily.   Insulin Aspart FlexPen (NOVOLOG) 100 UNIT/ML Inject 5 Units into the skin daily.   insulin glargine (LANTUS SOLOSTAR) 100 UNIT/ML Solostar Pen Inject 40 Units into the skin 2 (two) times daily.   Lancets (ONETOUCH DELICA PLUS LANCET33G)  MISC Apply topically.   levothyroxine (SYNTHROID) 175 MCG tablet Take 175 mcg by mouth daily before breakfast.   omeprazole (PRILOSEC) 10 MG capsule Take 10 mg by mouth daily.   sacubitril-valsartan (ENTRESTO) 24-26 MG Take 1 tablet by mouth daily.   sertraline (ZOLOFT) 100 MG tablet Take 200 mg by mouth daily.    Tamsulosin HCl (FLOMAX) 0.4 MG CAPS Take 1 capsule (0.4 mg total) by mouth every morning.     Allergies:   Corticosteroids, Codeine, Crestor [rosuvastatin calcium], Metformin hcl, and Simvastatin   Social History   Socioeconomic History   Marital status: Widowed    Spouse name: jody   Number of children: 2   Years of education: college   Highest education level: Master's degree (e.g., MA, MS, MEng, MEd, MSW, MBA)  Occupational History   Occupation: retired  Tobacco Use   Smoking status: Never   Smokeless tobacco: Never  Vaping Use   Vaping Use: Never used  Substance and Sexual Activity   Alcohol use: No    Comment: occasional glass of wine   Drug use: No   Sexual activity: Not on file  Other Topics Concern   Not on file  Social History Narrative   Not on file   Social Determinants of Health   Financial Resource Strain: Not on file  Food Insecurity: No Food  Insecurity (03/22/2022)   Hunger Vital Sign    Worried About Running Out of Food in the Last Year: Never true    Ran Out of Food in the Last Year: Never true  Transportation Needs: No Transportation Needs (03/22/2022)   PRAPARE - Administrator, Civil Service (Medical): No    Lack of Transportation (Non-Medical): No  Physical Activity: Not on file  Stress: Not on file  Social Connections: Not on file     Family History: The patient's family history includes Healthy in his daughter, sister, and son; Heart attack in his father; Heart disease in his father; Lung cancer in his mother. There is no history of Anesthesia problems, Hypotension, or Pseudochol deficiency.  ROS:   Please see the history of present illness.    All other systems reviewed and are negative.  EKGs/Labs/Other Studies Reviewed:    The following studies were reviewed today:    TAVR OPERATIVE NOTE     Date of Procedure:                08/29/2021   Preoperative Diagnosis:      Severe Aortic Stenosis    Postoperative Diagnosis:    Same    Procedure:        Transcatheter Aortic Valve Replacement - Percutaneous Right Transfemoral Approach             Edwards Sapien 3 Ultra Resilia THV (size 29 mm, model # 9755RSL, serial # [id number])              Co-Surgeons:                        Alleen Borne, MD and Tonny Bollman, MD       Anesthesiologist:                  Shona Simpson, MD   Echocardiographer:              Charlton Haws, Md   Pre-operative Echo Findings: Severe aortic stenosis moderate left ventricular systolic dysfunction   Post-operative Echo Findings: No paravalvular leak  Improved left ventricular systolic function   _____________     Echo 08/30/21:  IMPRESSIONS   1. Global hypokinesis abnormal septam motion septal and apical  hypokinesis . Left ventricular ejection fraction, by estimation, is 40 to  45%. The left ventricle has mildly decreased function. The left ventricle   demonstrates global hypokinesis. The left  ventricular internal cavity size was moderately dilated. There is severe  left ventricular hypertrophy. Left ventricular diastolic parameters are  consistent with Grade I diastolic dysfunction (impaired relaxation).   2. Pacing wires RA/RV. Right ventricular systolic function is normal. The  right ventricular size is normal.   3. Left atrial size was severely dilated.   4. The pericardial effusion is posterior to the left ventricle and  lateral to the left ventricle.   5. The mitral valve is abnormal. Moderate mitral valve regurgitation. No  evidence of mitral stenosis.   6. Post TAVR with 29 mm Sapien 3 valve mean gradient 7 peak 13 mmHg no  significant PVL . The aortic valve is normal in structure. Aortic valve  regurgitation is not visualized. No aortic stenosis is present. There is a  29 mm Sapien prosthetic (TAVR)  valve present in the aortic position. Procedure Date: 08/29/2021.   7. Aortic dilatation noted. There is mild dilatation of the ascending  aorta, measuring 40 mm.   8. The inferior vena cava is normal in size with greater than 50%  respiratory variability, suggesting right atrial pressure of 3 mmHg.    ___________________     Echo 09/27/21 IMPRESSIONS  1. Left ventricular ejection fraction, by estimation, is 40 to 45%. The left ventricle has mildly decreased function. The left ventricle demonstrates regional wall motion abnormalities. Septal/apical hypokinesis. There is moderate left ventricular  hypertrophy. Left ventricular diastolic parameters are consistent with Grade II diastolic dysfunction (pseudonormalization). Elevated left atrial pressure.  2. Right ventricular systolic function is normal. The right ventricular size is mildly enlarged. There is normal pulmonary artery systolic pressure. The estimated right ventricular systolic pressure is 28.6 mmHg.  3. Left atrial size was mildly dilated.  4. A small pericardial  effusion is present.  5. The mitral valve is normal in structure. Mild mitral valve regurgitation. No evidence of mitral stenosis.  6. There is mild dilatation of the ascending aorta, measuring 39 mm.  7. The inferior vena cava is normal in size with greater than 50% respiratory variability, suggesting right atrial pressure of 3 mmHg.  8. There is a 29 mm Edwards Sapien 3 prosthetic (TAVR) valve present in the aortic position. Procedure Date: 08/29/2021. Echo findings are consistent with normal structure and function of the aortic valve prosthesis. Trivial aortic regurgitation. Vmax 2.4  m/s, MG 12 mmHg, EOA 2.3 cm^2, DI 0.46  ____________________  Echocardiogram 08/22/22:  1. Akinesis of the septum, distal inferior wall and apex; overall  moderate to severe LV dysfunction; s/p TAVR with mean gradient 4 mmHg, DI  0.53 and no AI; compared to 03/23/22 LV function is worse and pericardial  effusion slightly larger.   2. Left ventricular ejection fraction, by estimation, is 30 to 35%. The  left ventricle has moderate to severely decreased function. The left  ventricle demonstrates regional wall motion abnormalities (see scoring  diagram/findings for description). There  is mild concentric left ventricular hypertrophy. Left ventricular  diastolic parameters are consistent with Grade I diastolic dysfunction  (impaired relaxation). Elevated left atrial pressure.   3. Right ventricular systolic function is normal. The right ventricular  size is normal.  4. Left atrial size was mildly dilated.   5. A small pericardial effusion is present.   6. The mitral valve is normal in structure. Mild mitral valve  regurgitation. No evidence of mitral stenosis.   7. The aortic valve has been repaired/replaced. Aortic valve  regurgitation is not visualized. No aortic stenosis is present. There is a  29 mm Sapien prosthetic (TAVR) valve present in the aortic position.  Procedure Date: 08/29/2021. Echo findings  are  consistent with normal structure and function of the aortic valve  prosthesis.   8. Aortic dilatation noted. There is mild dilatation of the ascending  aorta, measuring 40 mm.   EKG:  EKG is not ordered today.    Recent Labs: 03/22/2022: ALT 29; TSH 3.342 08/22/2022: BUN 15; Creatinine, Ser 1.40; Hemoglobin 14.4; Platelets 176; Potassium 3.9; Sodium 141   Recent Lipid Panel    Component Value Date/Time   CHOL 141 03/23/2022 0253   TRIG 152 (H) 03/23/2022 0253   HDL 36 (L) 03/23/2022 0253   CHOLHDL 3.9 03/23/2022 0253   VLDL 30 03/23/2022 0253   LDLCALC 75 03/23/2022 0253    Physical Exam:    VS:  BP 130/78   Pulse 83   Ht 6' (1.829 m)   Wt 243 lb 12.8 oz (110.6 kg)   SpO2 98%   BMI 33.07 kg/m     Wt Readings from Last 3 Encounters:  09/07/22 243 lb 12.8 oz (110.6 kg)  08/22/22 246 lb (111.6 kg)  05/31/22 239 lb (108.4 kg)    General: Well developed, well nourished, NAD Lungs:Clear to ausculation bilaterally. No wheezes, rales, or rhonchi. Breathing is unlabored. Cardiovascular: RRR with S1 S2. + soft murmurs Extremities: No edema.  Neuro: Alert and oriented. No focal deficits. No facial asymmetry. MAE spontaneously. Psych: Responds to questions appropriately with normal affect.    ASSESSMENT/PLAN:    Severe AS s/p TAVR: recent echo showed stable valve function. Continue Plavix monotherapy given recent CVA. Continue SBE prophylaxis with amoxicillin.   Chronic HFrEF/NICM: EF 30-35%. Appears euvolemic. Continue Coreg 3.125mg  BID. Entresto recently added back. Creat 1.4. Check BMET today with adding back Entresto.   Recent CVA: Admitted 02/2022 for acute CVA. CT negative and no MRI performed given PCM. Neurology consulted and Plavix added to regimen. No new symptoms. Continue Plavix, statin   S/p PPM: Followed by Clementeen Hoof and Dr. Johney Frame    HTN: BP well controlled today. Will keep a log and bring in medications next week to clinic. He is not sure what he is  taking so it is hard to titrate meds. Will bring him back in and reconcile all his medications and then made adjustments based on labs and BP log.    Medication Adjustments/Labs and Tests Ordered: Current medicines are reviewed at length with the patient today.  Concerns regarding medicines are outlined above.  Orders Placed This Encounter  Procedures   Basic Metabolic Panel (BMET)   No orders of the defined types were placed in this encounter.   Patient Instructions  Medication Instructions:   Your physician recommends that you continue on your current medications as directed. Please refer to the Current Medication list given to you today.   *If you need a refill on your cardiac medications before your next appointment, please call your pharmacy*   Lab Work:  TODAY!!!!BMET  If you have labs (blood work) drawn today and your tests are completely normal, you will receive your results only by: MyChart Message (if you  have MyChart) OR A paper copy in the mail If you have any lab test that is abnormal or we need to change your treatment, we will call you to review the results.   Testing/Procedures:  None ordered.   Follow-Up: At [facility], you and your health needs are our priority.  As part of our continuing mission to provide you with exceptional heart care, we have created designated Provider Care Teams.  These Care Teams include your primary Cardiologist (physician) and Advanced Practice Providers (APPs -  Physician Assistants and Nurse Practitioners) who all work together to provide you with the care you need, when you need it.  We recommend signing up for the patient portal called "MyChart".  Sign up information is provided on this After Visit Summary.  MyChart is used to connect with patients for Virtual Visits (Telemedicine).  Patients are able to view lab/test results, encounter notes, upcoming appointments, etc.  Non-urgent messages can be sent to your provider  as well.   To learn more about what you can do with MyChart, go to [email or web address].    Your next appointment:   1 week(s)  Provider:   Carlean Jews, PA-C    Other Instructions  Bring medications next week to office visit.     Signed, Cline Crock, PA-C  09/07/2022 11:29 AM    [facility]

## 2022-09-07 ENCOUNTER — Ambulatory Visit: Payer: Medicare PPO | Attending: Physician Assistant | Admitting: Physician Assistant

## 2022-09-07 VITALS — BP 130/78 | HR 83 | Ht 72.0 in | Wt 243.8 lb

## 2022-09-07 DIAGNOSIS — Z8673 Personal history of transient ischemic attack (TIA), and cerebral infarction without residual deficits: Secondary | ICD-10-CM

## 2022-09-07 DIAGNOSIS — Z950 Presence of cardiac pacemaker: Secondary | ICD-10-CM | POA: Diagnosis not present

## 2022-09-07 DIAGNOSIS — I5042 Chronic combined systolic (congestive) and diastolic (congestive) heart failure: Secondary | ICD-10-CM | POA: Diagnosis not present

## 2022-09-07 DIAGNOSIS — E039 Hypothyroidism, unspecified: Secondary | ICD-10-CM | POA: Diagnosis not present

## 2022-09-07 DIAGNOSIS — I10 Essential (primary) hypertension: Secondary | ICD-10-CM | POA: Diagnosis not present

## 2022-09-07 DIAGNOSIS — E669 Obesity, unspecified: Secondary | ICD-10-CM | POA: Diagnosis not present

## 2022-09-07 DIAGNOSIS — Z952 Presence of prosthetic heart valve: Secondary | ICD-10-CM | POA: Diagnosis not present

## 2022-09-07 DIAGNOSIS — Z794 Long term (current) use of insulin: Secondary | ICD-10-CM | POA: Diagnosis not present

## 2022-09-07 DIAGNOSIS — E1165 Type 2 diabetes mellitus with hyperglycemia: Secondary | ICD-10-CM | POA: Diagnosis not present

## 2022-09-07 LAB — BASIC METABOLIC PANEL
BUN/Creatinine Ratio: 12 (ref 10–24)
BUN: 17 mg/dL (ref 8–27)
CO2: 26 mmol/L (ref 20–29)
Calcium: 8.9 mg/dL (ref 8.6–10.2)
Chloride: 100 mmol/L (ref 96–106)
Creatinine, Ser: 1.41 mg/dL — ABNORMAL HIGH (ref 0.76–1.27)
Glucose: 151 mg/dL — ABNORMAL HIGH (ref 70–99)
Potassium: 3.5 mmol/L (ref 3.5–5.2)
Sodium: 142 mmol/L (ref 134–144)
eGFR: 53 mL/min/{1.73_m2} — ABNORMAL LOW (ref >59)

## 2022-09-07 NOTE — Patient Instructions (Signed)
Medication Instructions:   Your physician recommends that you continue on your current medications as directed. Please refer to the Current Medication list given to you today.   *If you need a refill on your cardiac medications before your next appointment, please call your pharmacy*   Lab Work:  TODAY!!!!BMET  If you have labs (blood work) drawn today and your tests are completely normal, you will receive your results only by: MyChart Message (if you have MyChart) OR A paper copy in the mail If you have any lab test that is abnormal or we need to change your treatment, we will call you to review the results.   Testing/Procedures:  None ordered.   Follow-Up: At [facility], you and your health needs are our priority.  As part of our continuing mission to provide you with exceptional heart care, we have created designated Provider Care Teams.  These Care Teams include your primary Cardiologist (physician) and Advanced Practice Providers (APPs -  Physician Assistants and Nurse Practitioners) who all work together to provide you with the care you need, when you need it.  We recommend signing up for the patient portal called "MyChart".  Sign up information is provided on this After Visit Summary.  MyChart is used to connect with patients for Virtual Visits (Telemedicine).  Patients are able to view lab/test results, encounter notes, upcoming appointments, etc.  Non-urgent messages can be sent to your provider as well.   To learn more about what you can do with MyChart, go to [email or web address].    Your next appointment:   1 week(s)  Provider:   Carlean Jews, PA-C    Other Instructions  Bring medications next week to office visit.

## 2022-09-13 NOTE — Progress Notes (Deleted)
[facility]   MULTIDISCIPLINARY HEART VALVE CLINIC                                     Cardiology Office Note:    Date:  09/13/2022   ID:  Tony [name], DOB 01/06/1951, MRN [id number]  PCP:  Tony Aspen, MD  [facility] Cardiologist:  Tony Guadalajara, MD  / Dr. Excell Seltzer, MD and Dr. Laneta Simmers, MD (TAVR)  [facility] Electrophysiologist:  Hillis Range, MD (Inactive)  Referring MD: Tony [name], *   Follow up to reconcile meds and optimize CHF regimen  History of Present Illness:    Tony [name] is [name] 72 y.o. 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    Tony [name] had been followed over time for severe AS. Echo last year showed EF 40-45%, with severe asymmetric left ventricular hypertrophy of the basal-septal segment and severe AS with [name] mean gradient of 25 mm hg and DVI 0.24 as well as an ascending aorta is mildly dilated at 4.1 cm and mild to moderate mitral valve regurgitation. L/RHC on 08/07/21 showed patent coronary arteries with mild diffuse nonobstructive plaquing noted. He reported having exertional shortness of breath with walking longer distances or up hills and occasional episodes of dizziness when he sits up from laying down.    He was evaluated by the multidisciplinary valve team and underwent successful TAVR with [name] 29 mm Edwards Sapien 3 Ultra Resilia THV via the TF approach on 08/29/21. Post operative echo showed EF 40-45%, normally functioning TAVR with [name] mean gradient of 7 mmHg and np PVL as well as moderate MR. He was discharged on aspirin alone.1 month echo 09/27/21 showed EF 40-45%, moderate LVH, normally functioning TAVR with [name] mean gradient of 12 mmHg and no PVL as well as mild MR and small pericardial effusion (old). He had [name] clinical improvement after TAVR.     He was admitted 02/2022 with stroke like symptoms of upper extremity and facial numbness. He was brought to the ED an CT imaging did not show acute stroke. MRI was incompatible with pacemaker. He was seen by neurology and was started on Plavix in additon to ASA x 3 weeks then Plavix alone.   Recently seen back for 1 year follow up s/p TAVR on 08/22/22. Echo showed EF 30-35%, normally functioning TAVR with [name] mean gradient of 4 mm hg and no PVL as well as small pericardial effusion that was slightly larger. Thankfully, he was clinically stable. He reported having stopped his Entresto. This was added back at 24-26mg  BID and hydralazine 25mg  TID decreased to BID. I saw him back in clinic for follow up and labs. Creat 1.41 and K 3.5. At the appointment he did not know what meds he was taking and so follow up was planned for him to come in with his medications and reconcile them.   Today the patient presents to clinic for follow up.     Past Medical History:  Diagnosis Date   Cancer (HCC)    thyroid   CHF (congestive heart failure) (HCC)    Complete heart block (HCC)    [name]. s/p MDT dual chamber PPM followed by Dr Johney Frame  Complication of anesthesia    1985 after Thyriodectomy hard time waking up   Diabetes mellitus    GERD (gastroesophageal reflux disease)    Hypertension    Hypothyroidism    Had two surgeries for Cancer   Presence of permanent cardiac pacemaker    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   s/p TAVR with [name] 29 mm Edwards S3UR via the TF approach by Dr. Excell [name] and Dr. Laneta [name]   Severe aortic stenosis    Sleep apnea     Past Surgical History:  Procedure Laterality Date   CARDIAC CATHETERIZATION     INTRAOPERATIVE TRANSTHORACIC ECHOCARDIOGRAM N/[name] 08/29/2021   Procedure: INTRAOPERATIVE TRANSTHORACIC ECHOCARDIOGRAM;  Surgeon: Tonny Bollman, MD;  [facility] INVASIVE CV LAB;  Service: Open Heart Surgery;  Laterality: N/[name];   LUMBAR LAMINECTOMY/DECOMPRESSION  MICRODISCECTOMY  07/19/2011   Procedure: LUMBAR LAMINECTOMY/DECOMPRESSION MICRODISCECTOMY 3 LEVELS;  Surgeon: Venita Lick, MD;  Location: [facility] OR;  Service: Orthopedics;  Laterality: Left;  Lumbar three-Lumbar five LEFT DECOMPRESSION AND FORAMINOTOMY Lumbar three-four LEFT DISCECTOMY   PERMANENT PACEMAKER INSERTION N/[name] 08/11/2014   MDT Adapta L implanted by Dr Johney Frame for CHB   RIGHT HEART CATH AND CORONARY ANGIOGRAPHY N/[name] 08/07/2021   Procedure: RIGHT HEART CATH AND CORONARY ANGIOGRAPHY;  Surgeon: Tonny Bollman, MD;  Location: [facility] INVASIVE CV LAB;  Service: Cardiovascular;  Laterality: N/[name];   RIGHT/LEFT HEART CATH AND CORONARY ANGIOGRAPHY N/[name] 08/07/2021   Procedure: RIGHT/LEFT HEART CATH AND CORONARY ANGIOGRAPHY;  Surgeon: Tonny Bollman, MD;  Location: [facility] INVASIVE CV LAB;  Service: Cardiovascular;  Laterality: N/[name];   Thyroidectomy x2     TRANSCATHETER AORTIC VALVE REPLACEMENT, TRANSFEMORAL N/[name] 08/29/2021   Procedure: Transcatheter Aortic Valve Replacement, Transfemoral;  Surgeon: Tonny Bollman, MD;  Location: [facility] INVASIVE CV LAB;  Service: Open Heart Surgery;  Laterality: N/[name];    Current Medications: No outpatient medications have been marked as taking for the 09/14/22 encounter (Appointment) with [facility].     Allergies:   Corticosteroids, Codeine, Crestor [rosuvastatin calcium], Metformin hcl, and Simvastatin   Social History   Socioeconomic History   Marital status: Widowed    Spouse name: jody   Number of children: 2   Years of education: college   Highest education level: Master's degree (e.g., MA, MS, MEng, MEd, MSW, MBA)  Occupational History   Occupation: retired  Tobacco Use   Smoking status: Never   Smokeless tobacco: Never  Vaping Use   Vaping Use: Never used  Substance and Sexual Activity   Alcohol use: No    Comment: occasional glass of wine   Drug use: No   Sexual activity: Not on file  Other Topics Concern   Not on file  Social History  Narrative   Not on file   Social Determinants of Health   Financial Resource Strain: Not on file  Food Insecurity: No Food Insecurity (03/22/2022)   Hunger Vital Sign    Worried About Running Out of Food in the Last Year: Never true    Ran Out of Food in the Last Year: Never true  Transportation Needs: No Transportation Needs (03/22/2022)   PRAPARE - Administrator, Civil Service (Medical): No    Lack of Transportation (Non-Medical): No  Physical Activity: Not on file  Stress: Not on file  Social Connections: Not on file     Family History: The patient's family history includes Healthy in his daughter, sister, and son; Heart attack in his father; Heart disease in his father;  Lung cancer in his mother. There is no history of Anesthesia problems, Hypotension, or Pseudochol deficiency.  ROS:   Please see the history of present illness.    All other systems reviewed and are negative.  EKGs/Labs/Other Studies Reviewed:    The following studies were reviewed today:    TAVR OPERATIVE NOTE     Date of Procedure:                08/29/2021   Preoperative Diagnosis:      Severe Aortic Stenosis    Postoperative Diagnosis:    Same    Procedure:        Transcatheter Aortic Valve Replacement - Percutaneous Right Transfemoral Approach             Edwards Sapien 3 Ultra Resilia THV (size 29 mm, model # 9755RSL, serial # [id number])              Co-Surgeons:                        Alleen Borne, MD and Tonny Bollman, MD       Anesthesiologist:                  Shona Simpson, MD   Echocardiographer:              Charlton Haws, Md   Pre-operative Echo Findings: Severe aortic stenosis moderate left ventricular systolic dysfunction   Post-operative Echo Findings: No paravalvular leak Improved left ventricular systolic function   _____________     Echo 08/30/21:  IMPRESSIONS   1. Global hypokinesis abnormal septam motion septal and apical  hypokinesis . Left  ventricular ejection fraction, by estimation, is 40 to  45%. The left ventricle has mildly decreased function. The left ventricle  demonstrates global hypokinesis. The left  ventricular internal cavity size was moderately dilated. There is severe  left ventricular hypertrophy. Left ventricular diastolic parameters are  consistent with Grade I diastolic dysfunction (impaired relaxation).   2. Pacing wires RA/RV. Right ventricular systolic function is normal. The  right ventricular size is normal.   3. Left atrial size was severely dilated.   4. The pericardial effusion is posterior to the left ventricle and  lateral to the left ventricle.   5. The mitral valve is abnormal. Moderate mitral valve regurgitation. No  evidence of mitral stenosis.   6. Post TAVR with 29 mm Sapien 3 valve mean gradient 7 peak 13 mmHg no  significant PVL . The aortic valve is normal in structure. Aortic valve  regurgitation is not visualized. No aortic stenosis is present. There is [name]  29 mm Sapien prosthetic (TAVR)  valve present in the aortic position. Procedure Date: 08/29/2021.   7. Aortic dilatation noted. There is mild dilatation of the ascending  aorta, measuring 40 mm.   8. The inferior vena cava is normal in size with greater than 50%  respiratory variability, suggesting right atrial pressure of 3 mmHg.    ___________________     Echo 09/27/21 IMPRESSIONS  1. Left ventricular ejection fraction, by estimation, is 40 to 45%. The left ventricle has mildly decreased function. The left ventricle demonstrates regional wall motion abnormalities. Septal/apical hypokinesis. There is moderate left ventricular  hypertrophy. Left ventricular diastolic parameters are consistent with Grade II diastolic dysfunction (pseudonormalization). Elevated left atrial pressure.  2. Right ventricular systolic function is normal. The right ventricular size is mildly enlarged. There is normal pulmonary artery systolic pressure. The  estimated right ventricular systolic pressure is 28.6 mmHg.  3. Left atrial size was mildly dilated.  4. [name] small pericardial effusion is present.  5. The mitral valve is normal in structure. Mild mitral valve regurgitation. No evidence of mitral stenosis.  6. There is mild dilatation of the ascending aorta, measuring 39 mm.  7. The inferior vena cava is normal in size with greater than 50% respiratory variability, suggesting right atrial pressure of 3 mmHg.  8. There is [name] 29 mm Edwards Sapien 3 prosthetic (TAVR) valve present in the aortic position. Procedure Date: 08/29/2021. Echo findings are consistent with normal structure and function of the aortic valve prosthesis. Trivial aortic regurgitation. Vmax 2.4  m/s, MG 12 mmHg, EOA 2.3 cm^2, DI 0.46  ____________________  Echocardiogram 08/22/22:  1. Akinesis of the septum, distal inferior wall and apex; overall  moderate to severe LV dysfunction; s/p TAVR with mean gradient 4 mmHg, DI  0.53 and no AI; compared to 03/23/22 LV function is worse and pericardial  effusion slightly larger.   2. Left ventricular ejection fraction, by estimation, is 30 to 35%. The  left ventricle has moderate to severely decreased function. The left  ventricle demonstrates regional wall motion abnormalities (see scoring  diagram/findings for description). There  is mild concentric left ventricular hypertrophy. Left ventricular  diastolic parameters are consistent with Grade I diastolic dysfunction  (impaired relaxation). Elevated left atrial pressure.   3. Right ventricular systolic function is normal. The right ventricular  size is normal.   4. Left atrial size was mildly dilated.   5. [name] small pericardial effusion is present.   6. The mitral valve is normal in structure. Mild mitral valve  regurgitation. No evidence of mitral stenosis.   7. The aortic valve has been repaired/replaced. Aortic valve  regurgitation is not visualized. No aortic stenosis is present.  There is [name]  29 mm Sapien prosthetic (TAVR) valve present in the aortic position.  Procedure Date: 08/29/2021. Echo findings are  consistent with normal structure and function of the aortic valve  prosthesis.   8. Aortic dilatation noted. There is mild dilatation of the ascending  aorta, measuring 40 mm.   EKG:  EKG is not ordered today.    Recent Labs: 03/22/2022: ALT 29; TSH 3.342 08/22/2022: Hemoglobin 14.4; Platelets 176 09/07/2022: BUN 17; Creatinine, Ser 1.41; Potassium 3.5; Sodium 142   Recent Lipid Panel    Component Value Date/Time   CHOL 141 03/23/2022 0253   TRIG 152 (H) 03/23/2022 0253   HDL 36 (L) 03/23/2022 0253   CHOLHDL 3.9 03/23/2022 0253   VLDL 30 03/23/2022 0253   LDLCALC 75 03/23/2022 0253    Physical Exam:    VS:  There were no vitals taken for this visit.    Wt Readings from Last 3 Encounters:  09/07/22 243 lb 12.8 oz (110.6 kg)  08/22/22 246 lb (111.6 kg)  05/31/22 239 lb (108.4 kg)    General: Well developed, well nourished, NAD Lungs:Clear to ausculation bilaterally. No wheezes, rales, or rhonchi. Breathing is unlabored. Cardiovascular: RRR with S1 S2. + soft murmurs Extremities: No edema.  Neuro: Alert and oriented. No focal deficits. No facial asymmetry. MAE spontaneously. Psych: Responds to questions appropriately with normal affect.    ASSESSMENT/PLAN:    Severe AS s/p TAVR: recent echo showed stable valve function. Continue Plavix monotherapy given recent CVA. Continue SBE prophylaxis with amoxicillin.   Chronic HFrEF/NICM: EF 30-35%. Appears euvolemic. Continue Coreg 3.125mg  BID. Entresto recently added back. Creat 1.4.  Check BMET today with adding back Entresto.   CVA: Admitted 02/2022 for acute CVA. CT negative and no MRI performed given pacemaker. Neurology consulted and Plavix added to regimen. No new symptoms. Continue Plavix, statin   S/p PPM: Followed by Clementeen Hoof and Dr. Johney Frame    HTN: BP log showed ***  Medication  management:  CKD stage IIIb: creat stable at 1.41.   Medication Adjustments/Labs and Tests Ordered: Current medicines are reviewed at length with the patient today.  Concerns regarding medicines are outlined above.  No orders of the defined types were placed in this encounter.  No orders of the defined types were placed in this encounter.   There are no Patient Instructions on file for this visit.   Signed, Cline Crock, PA-C  09/13/2022 5:32 PM    [facility]

## 2022-09-14 ENCOUNTER — Ambulatory Visit: Payer: Medicare PPO | Attending: Internal Medicine

## 2022-09-14 DIAGNOSIS — Z79899 Other long term (current) drug therapy: Secondary | ICD-10-CM

## 2022-09-14 DIAGNOSIS — Z8673 Personal history of transient ischemic attack (TIA), and cerebral infarction without residual deficits: Secondary | ICD-10-CM

## 2022-09-14 DIAGNOSIS — I10 Essential (primary) hypertension: Secondary | ICD-10-CM

## 2022-09-14 DIAGNOSIS — N189 Chronic kidney disease, unspecified: Secondary | ICD-10-CM

## 2022-09-14 DIAGNOSIS — I5042 Chronic combined systolic (congestive) and diastolic (congestive) heart failure: Secondary | ICD-10-CM

## 2022-09-14 DIAGNOSIS — Z952 Presence of prosthetic heart valve: Secondary | ICD-10-CM

## 2022-09-14 DIAGNOSIS — Z950 Presence of cardiac pacemaker: Secondary | ICD-10-CM

## 2022-09-16 ENCOUNTER — Emergency Department (HOSPITAL_COMMUNITY): Payer: Medicare PPO

## 2022-09-16 ENCOUNTER — Emergency Department (HOSPITAL_COMMUNITY)
Admission: EM | Admit: 2022-09-16 | Discharge: 2022-09-16 | Disposition: A | Discharge status: Home / Self Care | Payer: Medicare PPO | Attending: Emergency Medicine | Admitting: Emergency Medicine

## 2022-09-16 DIAGNOSIS — I6782 Cerebral ischemia: Secondary | ICD-10-CM | POA: Diagnosis not present

## 2022-09-16 DIAGNOSIS — R0902 Hypoxemia: Secondary | ICD-10-CM | POA: Diagnosis not present

## 2022-09-16 DIAGNOSIS — I10 Essential (primary) hypertension: Secondary | ICD-10-CM | POA: Diagnosis not present

## 2022-09-16 DIAGNOSIS — Z950 Presence of cardiac pacemaker: Secondary | ICD-10-CM | POA: Diagnosis not present

## 2022-09-16 DIAGNOSIS — R55 Syncope and collapse: Secondary | ICD-10-CM | POA: Insufficient documentation

## 2022-09-16 DIAGNOSIS — R42 Dizziness and giddiness: Secondary | ICD-10-CM | POA: Insufficient documentation

## 2022-09-16 DIAGNOSIS — R531 Weakness: Secondary | ICD-10-CM | POA: Diagnosis not present

## 2022-09-16 LAB — COMPREHENSIVE METABOLIC PANEL
ALT: 18 U/L (ref 0–44)
AST: 25 U/L (ref 15–41)
Albumin: 3.9 g/dL (ref 3.5–5.0)
Alkaline Phosphatase: 54 U/L (ref 38–126)
Anion gap: 13 (ref 5–15)
BUN: 21 mg/dL (ref 8–23)
CO2: 24 mmol/L (ref 22–32)
Calcium: 9.2 mg/dL (ref 8.9–10.3)
Chloride: 98 mmol/L (ref 98–111)
Creatinine, Ser: 1.56 mg/dL — ABNORMAL HIGH (ref 0.61–1.24)
GFR, Estimated: 47 mL/min — ABNORMAL LOW (ref >60)
Glucose, Bld: 204 mg/dL — ABNORMAL HIGH (ref 70–99)
Potassium: 4.2 mmol/L (ref 3.5–5.1)
Sodium: 135 mmol/L (ref 135–145)
Total Bilirubin: 2.2 mg/dL — ABNORMAL HIGH (ref 0.3–1.2)
Total Protein: 7.1 g/dL (ref 6.5–8.1)

## 2022-09-16 LAB — I-STAT CHEM 8, ED
BUN: 24 mg/dL — ABNORMAL HIGH (ref 8–23)
Calcium, Ion: 1.1 mmol/L — ABNORMAL LOW (ref 1.15–1.40)
Chloride: 100 mmol/L (ref 98–111)
Creatinine, Ser: 1.5 mg/dL — ABNORMAL HIGH (ref 0.61–1.24)
Glucose, Bld: 208 mg/dL — ABNORMAL HIGH (ref 70–99)
HCT: 42 % (ref 39.0–52.0)
Hemoglobin: 14.3 g/dL (ref 13.0–17.0)
Potassium: 4.3 mmol/L (ref 3.5–5.1)
Sodium: 137 mmol/L (ref 135–145)
TCO2: 27 mmol/L (ref 22–32)

## 2022-09-16 LAB — CBC WITH DIFFERENTIAL/PLATELET
Abs Immature Granulocytes: 0.05 10*3/uL (ref 0.00–0.07)
Basophils Absolute: 0.1 10*3/uL (ref 0.0–0.1)
Basophils Relative: 1 %
Eosinophils Absolute: 0.1 10*3/uL (ref 0.0–0.5)
Eosinophils Relative: 1 %
HCT: 43 % (ref 39.0–52.0)
Hemoglobin: 14.3 g/dL (ref 13.0–17.0)
Immature Granulocytes: 1 %
Lymphocytes Relative: 17 %
Lymphs Abs: 1.4 10*3/uL (ref 0.7–4.0)
MCH: 30.4 pg (ref 26.0–34.0)
MCHC: 33.3 g/dL (ref 30.0–36.0)
MCV: 91.5 fL (ref 80.0–100.0)
Monocytes Absolute: 0.8 10*3/uL (ref 0.1–1.0)
Monocytes Relative: 10 %
Neutro Abs: 5.9 10*3/uL (ref 1.7–7.7)
Neutrophils Relative %: 70 %
Platelets: 182 10*3/uL (ref 150–400)
RBC: 4.7 MIL/uL (ref 4.22–5.81)
RDW: 15.5 % (ref 11.5–15.5)
WBC: 8.3 10*3/uL (ref 4.0–10.5)
nRBC: 0 % (ref 0.0–0.2)

## 2022-09-16 LAB — URINALYSIS, ROUTINE W REFLEX MICROSCOPIC
Bacteria, UA: NONE SEEN
Bilirubin Urine: NEGATIVE
Glucose, UA: >=500 mg/dL — AB
Hgb urine dipstick: NEGATIVE
Ketones, ur: NEGATIVE mg/dL
Leukocytes,Ua: NEGATIVE
Nitrite: NEGATIVE
Protein, ur: >=300 mg/dL — AB
Specific Gravity, Urine: 1.022 (ref 1.005–1.030)
pH: 7 (ref 5.0–8.0)

## 2022-09-16 LAB — TROPONIN I (HIGH SENSITIVITY)
Troponin I (High Sensitivity): 45 ng/L — ABNORMAL HIGH (ref <18)
Troponin I (High Sensitivity): 44 ng/L — ABNORMAL HIGH (ref <18)

## 2022-09-16 LAB — BRAIN NATRIURETIC PEPTIDE: B Natriuretic Peptide: 365.2 pg/mL — ABNORMAL HIGH (ref 0.0–100.0)

## 2022-09-16 MED ORDER — SODIUM CHLORIDE 0.9 % IV BOLUS
500.0000 mL | Freq: Once | INTRAVENOUS | Status: AC
  Administered 2022-09-16: 500 mL via INTRAVENOUS

## 2022-09-16 NOTE — ED Notes (Signed)
Orothostatic VS Pt denied dizziness, SOB, CP and light headedness    09/16/22 1833 09/16/22 1835 09/16/22 1837  Vitals  BP (!) 171/107 (!) 175/108 (!) 173/97  MAP (mmHg) 124 128 119  BP Location Left Arm Left Arm Left Arm  BP Method Automatic Automatic Automatic  Patient Position (if appropriate) Lying Sitting Standing  Pulse Rate 85 82 83  ECG Heart Rate 84 82 85

## 2022-09-16 NOTE — ED Triage Notes (Signed)
Pt BIB EMS from [employer] after syncope with minor accident. Pt was initially 85% on room air and is now 98% on RA. A&Ox4, NAD noted at this time. Pt recent diagnosis with early dementia.   EMS VS 180/118 BG 231 V-paced pacemaker without defib  20 L AC

## 2022-09-16 NOTE — Discharge Instructions (Signed)
Return for any problem.  ?

## 2022-09-16 NOTE — ED Notes (Signed)
NAD noted, respirations are equal bilaterally and unlabored at this time. Pt resting in gurney and denies any unmet needs. Pt connected to CCM, pulseox & BP. Call light within reach. 
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
## 2022-09-17 ENCOUNTER — Encounter: Payer: Self-pay | Admitting: Physician Assistant

## 2022-09-17 ENCOUNTER — Ambulatory Visit (INDEPENDENT_AMBULATORY_CARE_PROVIDER_SITE_OTHER): Payer: Medicare PPO

## 2022-09-17 DIAGNOSIS — I442 Atrioventricular block, complete: Secondary | ICD-10-CM

## 2022-09-17 LAB — CUP PACEART REMOTE DEVICE CHECK
Battery Impedance: 1219 Ohm
Battery Remaining Longevity: 50 mo
Battery Voltage: 2.76 V
Brady Statistic AP VP Percent: 9 %
Brady Statistic AP VS Percent: 0 %
Brady Statistic AS VP Percent: 90 %
Brady Statistic AS VS Percent: 1 %
Date Time Interrogation Session: 20240519151949
Implantable Lead Connection Status: 753985
Implantable Lead Connection Status: 753985
Implantable Lead Implant Date: 20160413
Implantable Lead Implant Date: 20160413
Implantable Lead Location: 753859
Implantable Lead Location: 753860
Implantable Lead Model: 5076
Implantable Lead Model: 5076
Implantable Pulse Generator Implant Date: 20160413
Lead Channel Impedance Value: 488 Ohm
Lead Channel Impedance Value: 489 Ohm
Lead Channel Pacing Threshold Amplitude: 0.5 V
Lead Channel Pacing Threshold Amplitude: 0.625 V
Lead Channel Pacing Threshold Pulse Width: 0.4 ms
Lead Channel Pacing Threshold Pulse Width: 0.4 ms
Lead Channel Setting Pacing Amplitude: 2 V
Lead Channel Setting Pacing Amplitude: 2.5 V
Lead Channel Setting Pacing Pulse Width: 0.4 ms
Lead Channel Setting Sensing Sensitivity: 4 mV
Zone Setting Status: 755011
Zone Setting Status: 755011

## 2022-09-20 DIAGNOSIS — E1165 Type 2 diabetes mellitus with hyperglycemia: Secondary | ICD-10-CM | POA: Diagnosis not present

## 2022-10-15 NOTE — Progress Notes (Signed)
Remote pacemaker transmission.   

## 2022-10-24 DIAGNOSIS — I10 Essential (primary) hypertension: Secondary | ICD-10-CM | POA: Diagnosis not present

## 2022-10-24 DIAGNOSIS — G459 Transient cerebral ischemic attack, unspecified: Secondary | ICD-10-CM | POA: Diagnosis not present

## 2022-10-24 DIAGNOSIS — I77810 Thoracic aortic ectasia: Secondary | ICD-10-CM | POA: Diagnosis not present

## 2022-10-24 DIAGNOSIS — Z8673 Personal history of transient ischemic attack (TIA), and cerebral infarction without residual deficits: Secondary | ICD-10-CM | POA: Diagnosis not present

## 2022-10-24 DIAGNOSIS — E039 Hypothyroidism, unspecified: Secondary | ICD-10-CM | POA: Diagnosis not present

## 2022-10-24 DIAGNOSIS — G4733 Obstructive sleep apnea (adult) (pediatric): Secondary | ICD-10-CM | POA: Diagnosis not present

## 2022-10-24 DIAGNOSIS — F331 Major depressive disorder, recurrent, moderate: Secondary | ICD-10-CM | POA: Diagnosis not present

## 2022-10-24 DIAGNOSIS — E1165 Type 2 diabetes mellitus with hyperglycemia: Secondary | ICD-10-CM | POA: Diagnosis not present

## 2022-10-24 DIAGNOSIS — E1121 Type 2 diabetes mellitus with diabetic nephropathy: Secondary | ICD-10-CM | POA: Diagnosis not present

## 2022-10-30 DIAGNOSIS — N529 Male erectile dysfunction, unspecified: Secondary | ICD-10-CM | POA: Diagnosis not present

## 2022-10-30 DIAGNOSIS — E669 Obesity, unspecified: Secondary | ICD-10-CM | POA: Diagnosis not present

## 2022-10-30 DIAGNOSIS — Z823 Family history of stroke: Secondary | ICD-10-CM | POA: Diagnosis not present

## 2022-10-30 DIAGNOSIS — Z809 Family history of malignant neoplasm, unspecified: Secondary | ICD-10-CM | POA: Diagnosis not present

## 2022-10-30 DIAGNOSIS — E785 Hyperlipidemia, unspecified: Secondary | ICD-10-CM | POA: Diagnosis not present

## 2022-10-30 DIAGNOSIS — E890 Postprocedural hypothyroidism: Secondary | ICD-10-CM | POA: Diagnosis not present

## 2022-10-30 DIAGNOSIS — H5461 Unqualified visual loss, right eye, normal vision left eye: Secondary | ICD-10-CM | POA: Diagnosis not present

## 2022-10-30 DIAGNOSIS — G4733 Obstructive sleep apnea (adult) (pediatric): Secondary | ICD-10-CM | POA: Diagnosis not present

## 2022-10-30 DIAGNOSIS — K219 Gastro-esophageal reflux disease without esophagitis: Secondary | ICD-10-CM | POA: Diagnosis not present

## 2022-10-30 DIAGNOSIS — Z7984 Long term (current) use of oral hypoglycemic drugs: Secondary | ICD-10-CM | POA: Diagnosis not present

## 2022-10-30 DIAGNOSIS — I69398 Other sequelae of cerebral infarction: Secondary | ICD-10-CM | POA: Diagnosis not present

## 2022-10-30 DIAGNOSIS — R011 Cardiac murmur, unspecified: Secondary | ICD-10-CM | POA: Diagnosis not present

## 2022-10-30 DIAGNOSIS — Z8249 Family history of ischemic heart disease and other diseases of the circulatory system: Secondary | ICD-10-CM | POA: Diagnosis not present

## 2022-10-30 DIAGNOSIS — I509 Heart failure, unspecified: Secondary | ICD-10-CM | POA: Diagnosis not present

## 2022-10-30 DIAGNOSIS — I700 Atherosclerosis of aorta: Secondary | ICD-10-CM | POA: Diagnosis not present

## 2022-10-30 DIAGNOSIS — I429 Cardiomyopathy, unspecified: Secondary | ICD-10-CM | POA: Diagnosis not present

## 2022-10-30 DIAGNOSIS — N401 Enlarged prostate with lower urinary tract symptoms: Secondary | ICD-10-CM | POA: Diagnosis not present

## 2022-10-30 DIAGNOSIS — R2681 Unsteadiness on feet: Secondary | ICD-10-CM | POA: Diagnosis not present

## 2022-11-28 NOTE — Progress Notes (Unsigned)
No chief complaint on file.     ASSESSMENT AND PLAN  Tony [name] is a 72 y.o. 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, likely right posterior limb of internal capsule acute small vessel event,  Vascular risk factor of aging, hypertension, hyperlipidemia, diabetes, history of aortic valve replacement,  Continue Plavix 75 mg daily and atorvastatin 40 mg daily managed by PCP  Continue to follow with PCP for aggressive stroke risk factor management  Slow worsening gait abnormality,  Brisk reflex on examinations, CT cervical also demonstrate multilevel degenerative changes, variable degree of foraminal narrowing,  Discussed with patient and his daughter, decided to proceed with CT myelogram, will refer to neurosurgeon to do so   Mild cognitive impairment  MoCA examination 20/30  Lab work for reversible causes unremarkable     DIAGNOSTIC DATA (LABS, IMAGING, TESTING) - I reviewed patient records, labs, notes, testing and imaging myself where available.  Laboratory evaluation 05/31/2022 B12 417, RPR nonreactive    MEDICAL HISTORY:  Update 11/29/2022 JM: Returns for follow-up visit.  Reports cognition ***  Gait abnormality *** CT myelogram by neurosurgery ***   Denies new stroke/TIA symptoms.  Remains on Plavix and atorvastatin.  Routinely follows with PCP for stroke risk factor management.       Consult visit 05/31/2022 Dr. Terrace Arabia: Tony [name] is a 71 year old male, seen in request by his primary care physician Dr. [name], Tony [name] for evaluation of TIA, gait abnormality, memory loss, I was also able to talk with his daughter Tony [name] who is an anesthesiologist over the phone,   I reviewed and summarized the referring note.PMHX. HLD DM HTN S/p pacemake History of severe AS s/p TAVR (08/29/21)  History of sudden right visual loss  Patient used to work as a Event organiser, but had significant deconditioning over the years, he had bradycardia, required  pacemaker, severe aortic stenosis, status post TAVR in May 2023  Over the past few years, he was noted to have unsteady gait, fall risk, also slow worsening word finding difficulties, MoCA examination is 20/30  He had history of right sudden visual loss many years ago, was thought due to right central retinal artery event, but was not on any antiplatelet agent  Hospital admission on March 23, 2022, for acute onset of left facial droop, left hand numbness, weakness,  Personally reviewed CT head without contrast, no acute abnormality, mild small vessel disease, mild generalized atrophy  CT angiogram of head and neck, showed no large vessel disease,  CT angiogram reviewed, also demonstrate severe multilevel cervical degenerative changes, with evidence of variable degree of canal stenosis  He is not MRI candidate due to pacemaker, he was discharged with double antiplatelet agent aspirin plus Plavix 75 mg, now on Plavix 75 mg alone   Labs in Nov 2023, LDL 75. TSH, A1C 8.6, Hg 13.5, UDS negative  Echocardiogram only decreased ejection fraction 40 to 45%, severe concentric hypertrophy, mild global hypokinesia  PHYSICAL EXAM:   There were no vitals filed for this visit.   There is no height or weight on file to calculate BMI.  PHYSICAL EXAMNIATION:  Gen: NAD, conversant, well nourised, well groomed                     Cardiovascular: Regular rate rhythm, no peripheral edema, warm, nontender. Eyes: Conjunctivae clear without exudates or hemorrhage Neck: Supple, no carotid bruits. Pulmonary: Clear to auscultation bilaterally   NEUROLOGICAL EXAM:  MENTAL STATUS: Speech/cognition: Awake,  alert, oriented to history taking and casual conversation    05/31/2022    3:00 PM  Montreal Cognitive Assessment   Visuospatial/ Executive (0/5) 3  Naming (0/3) 3  Attention: Read list of digits (0/2) 2  Attention: Read list of letters (0/1) 1  Attention: Serial 7 subtraction starting at 100  (0/3) 3  Language: Repeat phrase (0/2) 2  Language : Fluency (0/1) 0  Abstraction (0/2) 2  Delayed Recall (0/5) 0  Orientation (0/6) 4  Total 20    CRANIAL NERVES: CN II: Visual fields are full to confrontation. Pupils are round equal and briskly reactive to light. CN III, IV, VI: extraocular movement are normal. No ptosis. CN V: Facial sensation is intact to light touch CN VII: Mild left lower face weakness CN VIII: Hearing is normal to causal conversation. CN IX, X: Phonation is normal. CN XI: Head turning and shoulder shrug are intact  MOTOR: Fixation of left arm on rapid rotating movement, mild left arm pronation drift  REFLEXES: Reflexes are 2+ and symmetric at the biceps, triceps, knees, and trace ankles. Plantar responses are flexor.  SENSORY: Intact to light touch, pinprick and vibratory sensation are intact in fingers and toes.  COORDINATION: There is no trunk or limb dysmetria noted.  GAIT/STANCE: Need push-up to get up from seated position, wide-based, cautious, unsteady, especially while turning,  REVIEW OF SYSTEMS:  Full 14 system review of systems performed and notable only for as above All other review of systems were negative.   ALLERGIES: Allergies  Allergen Reactions   Corticosteroids     Per patient's daughter patient is not allergic as of 07/21/21.   Codeine Itching   Crestor [Rosuvastatin Calcium] Other (See Comments)    Leg pain   Metformin Hcl Diarrhea   Simvastatin Other (See Comments)    Leg pain    HOME MEDICATIONS: Current Outpatient Medications  Medication Sig Dispense Refill   acetaminophen (TYLENOL) 500 MG tablet Take 500 mg by mouth every 6 (six) hours as needed for moderate pain (pain). (Patient not taking: Reported on 09/16/2022)     amoxicillin (AMOXIL) 500 MG tablet Take 4 tablets (2,000 mg total) by mouth as directed. 1 hour prior to dental work including cleanings (Patient not taking: Reported on 09/16/2022) 12 tablet 12    atorvastatin (LIPITOR) 40 MG tablet Take 40 mg by mouth daily.     BD PEN NEEDLE NANO 2ND GEN 32G X 4 MM MISC      canagliflozin (INVOKANA) 300 MG TABS tablet Take 300 mg by mouth daily before breakfast.     carvedilol (COREG) 3.125 MG tablet Take 1 tablet (3.125 mg total) by mouth 2 (two) times daily with a meal. (Patient not taking: Reported on 09/16/2022) 180 tablet 2   clopidogrel (PLAVIX) 75 MG tablet Take 1 tablet (75 mg total) by mouth daily. 90 tablet 3   Continuous Blood Gluc Receiver (FREESTYLE LIBRE 2 READER) DEVI by Does not apply route.     cyclobenzaprine (FLEXERIL) 10 MG tablet Take 10 mg by mouth 3 (three) times daily as needed for muscle spasms.     famotidine (PEPCID) 20 MG tablet Take 20 mg by mouth daily.     gabapentin (NEURONTIN) 100 MG capsule Take 2 capsules (200 mg total) by mouth at bedtime. (Patient not taking: Reported on 09/07/2022) 60 capsule 2   Insulin Aspart FlexPen (NOVOLOG) 100 UNIT/ML Inject 5 Units into the skin daily. (Patient not taking: Reported on 09/16/2022)     insulin  glargine (LANTUS SOLOSTAR) 100 UNIT/ML Solostar Pen Inject 40 Units into the skin 2 (two) times daily.     Lancets (ONETOUCH DELICA PLUS LANCET33G) MISC Apply topically.     levothyroxine (SYNTHROID) 175 MCG tablet Take 175 mcg by mouth daily before breakfast.     omeprazole (PRILOSEC) 10 MG capsule Take 10 mg by mouth daily.     sacubitril-valsartan (ENTRESTO) 24-26 MG Take 1 tablet by mouth daily. 60 tablet 0   sertraline (ZOLOFT) 100 MG tablet Take 200 mg by mouth daily.      Tamsulosin HCl (FLOMAX) 0.4 MG CAPS Take 1 capsule (0.4 mg total) by mouth every morning. 30 capsule 0   No current facility-administered medications for this visit.    PAST MEDICAL HISTORY: Past Medical History:  Diagnosis Date   Cancer (HCC)    thyroid   CHF (congestive heart failure) (HCC)    Complete heart block (HCC)    a. 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 Excell [facility] and Dr. Laneta Simmers   Severe aortic stenosis    Sleep apnea     PAST SURGICAL HISTORY: Past Surgical History:  Procedure Laterality Date   CARDIAC CATHETERIZATION     INTRAOPERATIVE TRANSTHORACIC ECHOCARDIOGRAM N/A 08/29/2021   Procedure: INTRAOPERATIVE TRANSTHORACIC ECHOCARDIOGRAM;  Surgeon: Tonny Bollman, MD;  Location: [facility] INVASIVE CV LAB;  Service: Open Heart Surgery;  Laterality: N/A;   LUMBAR LAMINECTOMY/DECOMPRESSION MICRODISCECTOMY  07/19/2011   Procedure: LUMBAR LAMINECTOMY/DECOMPRESSION MICRODISCECTOMY 3 LEVELS;  Surgeon: Venita Lick, MD;  Location: [facility] OR;  Service: Orthopedics;  Laterality: Left;  Lumbar three-Lumbar five LEFT DECOMPRESSION AND FORAMINOTOMY Lumbar three-four LEFT DISCECTOMY   PERMANENT PACEMAKER INSERTION N/A 08/11/2014   MDT Adapta L implanted by Dr Johney Frame for CHB   RIGHT HEART CATH AND CORONARY ANGIOGRAPHY N/A 08/07/2021   Procedure: RIGHT HEART CATH AND CORONARY ANGIOGRAPHY;  Surgeon: Tonny Bollman, MD;  Location: [facility] INVASIVE CV LAB;  Service: Cardiovascular;  Laterality: N/A;   RIGHT/LEFT HEART CATH AND CORONARY ANGIOGRAPHY N/A 08/07/2021   Procedure: RIGHT/LEFT HEART CATH AND CORONARY ANGIOGRAPHY;  Surgeon: Tonny Bollman, MD;  Location: [facility] INVASIVE CV LAB;  Service: Cardiovascular;  Laterality: N/A;   Thyroidectomy x2     TRANSCATHETER AORTIC VALVE REPLACEMENT, TRANSFEMORAL N/A 08/29/2021   Procedure: Transcatheter Aortic Valve Replacement, Transfemoral;  Surgeon: Tonny Bollman, MD;  Location: [facility] INVASIVE CV LAB;  Service: Open Heart Surgery;  Laterality: N/A;    FAMILY HISTORY: Family History  Problem  Relation Age of Onset   Lung cancer Mother    Heart disease Father    Heart attack Father    Healthy Sister    Healthy Daughter    Healthy Son    Anesthesia problems Neg Hx    Hypotension Neg Hx    Pseudochol deficiency Neg Hx     SOCIAL HISTORY: Social History   Socioeconomic History   Marital status: Widowed    Spouse name: jody   Number of children: 2   Years of education: college   Highest education level: Master's degree (e.g., MA, MS, MEng,  MEd, MSW, MBA)  Occupational History   Occupation: retired  Tobacco Use   Smoking status: Never   Smokeless tobacco: Never  Vaping Use   Vaping status: Never Used  Substance and Sexual Activity   Alcohol use: No    Comment: occasional glass of wine   Drug use: No   Sexual activity: Not on file  Other Topics Concern   Not on file  Social History Narrative   Not on file   Social Determinants of Health   Financial Resource Strain: Not on file  Food Insecurity: No Food Insecurity (03/22/2022)   Hunger Vital Sign    Worried About Running Out of Food in the Last Year: Never true    Ran Out of Food in the Last Year: Never true  Transportation Needs: No Transportation Needs (03/22/2022)   PRAPARE - Administrator, Civil Service (Medical): No    Lack of Transportation (Non-Medical): No  Physical Activity: Not on file  Stress: Not on file  Social Connections: Not on file  Intimate Partner Violence: Not At Risk (03/23/2022)   Humiliation, Afraid, Rape, and Kick questionnaire    Fear of Current or Ex-Partner: No    Emotionally Abused: No    Physically Abused: No    Sexually Abused: No      I spent *** minutes of face-to-face and non-face-to-face time with patient.  This included previsit chart review, lab review, study review, order entry, electronic health record documentation, patient education and discussion regarding above diagnoses and treatment plan and answered all other questions to patient's  satisfaction  Ihor Austin, [facility]  [facility] [address] [place], [place] [address]  Phone [phone] Fax [phone] Note: This document was prepared with digital dictation and possible smart phrase technology. Any transcriptional errors that result from this process are unintentional.

## 2022-11-29 ENCOUNTER — Encounter: Payer: Self-pay | Admitting: Adult Health

## 2022-11-29 ENCOUNTER — Ambulatory Visit: Payer: Medicare PPO | Admitting: Adult Health

## 2022-11-29 VITALS — BP 144/76 | HR 77 | Ht 71.0 in | Wt 239.0 lb

## 2022-11-29 DIAGNOSIS — R269 Unspecified abnormalities of gait and mobility: Secondary | ICD-10-CM | POA: Diagnosis not present

## 2022-11-29 DIAGNOSIS — G3184 Mild cognitive impairment, so stated: Secondary | ICD-10-CM

## 2022-11-29 DIAGNOSIS — R55 Syncope and collapse: Secondary | ICD-10-CM

## 2022-11-29 DIAGNOSIS — I639 Cerebral infarction, unspecified: Secondary | ICD-10-CM | POA: Diagnosis not present

## 2022-11-29 MED ORDER — MEMANTINE HCL 10 MG PO TABS: 10.0000 mg | ORAL_TABLET | Freq: Two times a day (BID) | ORAL | 11 refills | Status: AC

## 2022-11-29 MED ORDER — MEMANTINE HCL 28 X 5 MG & 21 X 10 MG PO TABS
ORAL_TABLET | ORAL | 0 refills | Status: DC
Start: 2022-11-29 — End: 2023-01-24

## 2022-11-29 NOTE — Patient Instructions (Addendum)
Recommend starting Namenda with gradual titration to 10mg  twice daily - this medication can help slow memory decline over time   Continue to follow with your primary doctor to manage stroke risk factors, continue plavix and atorvastatin   If gait starts to worsen, please follow back up with neurosurgery for potential need of imaging at that time   Please call with any additional pre syncopal events     Followup in the future with me in 6 months or call earlier if needed       Thank you for coming to see [place] at [facility]. I hope we have been able to provide you high quality care today.  You may receive a patient satisfaction survey over the next few weeks. We would appreciate your feedback and comments so that we may continue to improve ourselves and the health of our patients.

## 2022-12-13 DIAGNOSIS — N1831 Chronic kidney disease, stage 3a: Secondary | ICD-10-CM | POA: Diagnosis not present

## 2022-12-13 DIAGNOSIS — D631 Anemia in chronic kidney disease: Secondary | ICD-10-CM | POA: Diagnosis not present

## 2022-12-13 DIAGNOSIS — E785 Hyperlipidemia, unspecified: Secondary | ICD-10-CM | POA: Diagnosis not present

## 2022-12-13 DIAGNOSIS — N2581 Secondary hyperparathyroidism of renal origin: Secondary | ICD-10-CM | POA: Diagnosis not present

## 2022-12-13 DIAGNOSIS — R809 Proteinuria, unspecified: Secondary | ICD-10-CM | POA: Diagnosis not present

## 2022-12-13 DIAGNOSIS — I130 Hypertensive heart and chronic kidney disease with heart failure and stage 1 through stage 4 chronic kidney disease, or unspecified chronic kidney disease: Secondary | ICD-10-CM | POA: Diagnosis not present

## 2022-12-13 DIAGNOSIS — I5022 Chronic systolic (congestive) heart failure: Secondary | ICD-10-CM | POA: Diagnosis not present

## 2022-12-15 DIAGNOSIS — L03115 Cellulitis of right lower limb: Secondary | ICD-10-CM | POA: Diagnosis not present

## 2022-12-15 DIAGNOSIS — L089 Local infection of the skin and subcutaneous tissue, unspecified: Secondary | ICD-10-CM | POA: Diagnosis not present

## 2022-12-19 DIAGNOSIS — E1165 Type 2 diabetes mellitus with hyperglycemia: Secondary | ICD-10-CM | POA: Diagnosis not present

## 2023-01-01 NOTE — Progress Notes (Deleted)
Cardiology Clinic Note   Patient Name: Tony [name] Date of Encounter: 01/01/2023  Primary Care Provider:  Emilio Aspen, MD Primary Cardiologist:  Nicki Guadalajara, MD  Patient Profile    Tony [name] 72 year old male presents to the clinic today for follow-up evaluation of his aortic stenosis  Past Medical History    Past Medical History:  Diagnosis Date   Cancer [facility])    thyroid   CHF (congestive heart failure) (HCC)    Complete heart block (HCC)    a. 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 Excell Seltzer and Dr. Laneta Simmers   Severe aortic stenosis    Sleep apnea    Past Surgical History:  Procedure Laterality Date   CARDIAC CATHETERIZATION     INTRAOPERATIVE TRANSTHORACIC ECHOCARDIOGRAM N/A 08/29/2021   Procedure: INTRAOPERATIVE TRANSTHORACIC ECHOCARDIOGRAM;  Surgeon: Tonny Bollman, MD;  Location: [facility] INVASIVE CV LAB;  Service: Open Heart Surgery;  Laterality: N/A;   LUMBAR LAMINECTOMY/DECOMPRESSION MICRODISCECTOMY  07/19/2011   Procedure: LUMBAR LAMINECTOMY/DECOMPRESSION MICRODISCECTOMY 3 LEVELS;  Surgeon: Venita Lick, MD;  Location: [facility] OR;  Service: Orthopedics;  Laterality: Left;  Lumbar three-Lumbar five LEFT DECOMPRESSION AND FORAMINOTOMY Lumbar three-four LEFT DISCECTOMY   PERMANENT PACEMAKER INSERTION N/A 08/11/2014   MDT Adapta L implanted by Dr Johney Frame for CHB   RIGHT HEART CATH AND CORONARY ANGIOGRAPHY N/A 08/07/2021   Procedure: RIGHT HEART CATH AND CORONARY ANGIOGRAPHY;  Surgeon: Tonny Bollman, MD;  Location: [facility] INVASIVE CV LAB;  Service: Cardiovascular;  Laterality: N/A;    RIGHT/LEFT HEART CATH AND CORONARY ANGIOGRAPHY N/A 08/07/2021   Procedure: RIGHT/LEFT HEART CATH AND CORONARY ANGIOGRAPHY;  Surgeon: Tonny Bollman, MD;  Location: [facility] INVASIVE CV LAB;  Service: Cardiovascular;  Laterality: N/A;   Thyroidectomy x2     TRANSCATHETER AORTIC VALVE REPLACEMENT, TRANSFEMORAL N/A 08/29/2021   Procedure: Transcatheter Aortic Valve Replacement, Transfemoral;  Surgeon: Tonny Bollman, MD;  Location: [facility] INVASIVE CV LAB;  Service: Open Heart Surgery;  Laterality: N/A;    Allergies  Allergies  Allergen Reactions   Corticosteroids     Per patient's daughter patient is not allergic as of 07/21/21.   Codeine Itching   Crestor [Rosuvastatin Calcium] Other (See Comments)    Leg pain   Metformin Hcl Diarrhea   Simvastatin Other (See Comments)    Leg pain    History of Present Illness    Tony [name] has a PMH of complete heart block status post PPM 11/04/2019, aortic stenosis, essential hypertension, chronic systolic CHF, OSA on CPAP, type 2 diabetes, hypothyroidism, and dyspnea on exertion.  Echocardiogram 04/14/2020 EF 40-45%, G1 DD, trivial aortic valve regurgitation with moderate aortic valve stenosis and a aortic valve mean gradient measuring 31.6 mmHg.  Aortic dilation was also noted with a ascending aorta measuring 40 mm.  He was seen and evaluated by Dr. Johney Frame 05/02/2020.  During that time he continued to do fairly well.  He was noted to have shortness of breath with moderate activity.  He denied palpitations and chest pain.  He denied lower extremity edema, dizziness, presyncope,  and syncope.  His PPM was functioning normally and no changes were made.  He is device dependent.  Upgrade to CRT-P was discussed.  However, medical therapy was recommended.  He reported compliance with his CPAP.  He presented the clinic 04/13/21 to discuss possible TAVR.  He stated he had just finished his second course of antibiotics for UTI.  He first contracted UTI about 6 weeks  prior.  During that time he reported that he felt that his balance was unstable.   He reported that he had been walking and doing resistance type leg exercises every other day.  His weight had remained stable.  We discussed TAVR procedure.  I ordered repeat echocardiogram, and planned follow-up for 3 months.  He underwent TAVR on 08/29/2021.  His echocardiogram for his 1 year follow-up showed an EF of 30-35%, normal functioning TAVR valve with a mean gradient of 4 mmHg and no PVL.  Small pericardial effusion which was slightly larger was noted.  He was clinically stable.  He reported that he had stopped his Entresto.  He was restarted on 24-26 twice daily and hydralazine 25 mg 3 times daily was decreased to twice daily.  He was seen in follow-up by Carlean Jews on 09/07/2022.  At that time he reported that he was doing well.  He was tolerating his Entresto well.  He did not know if he was taking carvedilol or hydralazine.  He denied chest pain and shortness of breath.  He was euvolemic.  He denied syncope.  He denied bleeding issues and palpitations.  He presents to the clinic today for follow-up evaluation and states***.  Today he denies chest pain, shortness of breath, lower extremity edema, fatigue, palpitations, melena, hematuria, hemoptysis, diaphoresis, weakness, presyncope, syncope, orthopnea, and PND.   Home Medications    Prior to Admission medications   Medication Sig Start Date End Date Taking? Authorizing Provider  acetaminophen (TYLENOL) 500 MG tablet Take 500 mg by mouth every 6 (six) hours as needed (pain).     [provider]  aspirin EC 81 MG tablet Take 81 mg by mouth daily.    [provider]  atorvastatin (LIPITOR) 10 MG tablet Take 10 mg by mouth daily.    [provider]  carvedilol (COREG) 6.25 MG tablet TAKE 1 TABLET(6.25 MG) BY MOUTH TWICE DAILY 07/08/20   Graciella Freer, PA-C  Chlorpheniramine-DM (CORICIDIN HBP COUGH/COLD PO) Take by  mouth.    [provider]  cyclobenzaprine (FLEXERIL) 10 MG tablet Take 10 mg by mouth 3 (three) times daily as needed. For muscle pain.    [provider]  gabapentin (NEURONTIN) 100 MG capsule Take 2 capsules (200 mg total) by mouth at bedtime. Patient taking differently: Take 100 mg by mouth at bedtime. 02/24/19   Pollyann Savoy, MD  glipiZIDE (GLUCOTROL) 10 MG tablet Take 10 mg by mouth daily.  07/04/18   [provider]  ibuprofen (ADVIL,MOTRIN) 800 MG tablet Take 800 mg by mouth every 8 (eight) hours as needed. For pain.    [provider]  INVOKAMET XR [phone] MG TB24 Take 2 tablets by mouth daily.  07/24/18   [provider]  levothyroxine (SYNTHROID, LEVOTHROID) 175 MCG tablet Take 175 mcg by mouth daily before breakfast.     [provider]  Multiple Vitamins-Minerals (SENIOR MULTIVITAMIN PLUS PO) Take by mouth daily.    [provider]  mupirocin ointment (BACTROBAN) 2 % Apply 1 application topically as needed.  08/10/14   [provider]  omeprazole (PRILOSEC) 20 MG capsule Take 20 mg by mouth daily as needed. For acid reflux    [provider]  potassium chloride 20 MEQ TBCR Take 20 mEq by mouth daily. 09/18/19   Graciella Freer, PA-C  sacubitril-valsartan (ENTRESTO) 49-51 MG Take 1 tablet by mouth 2 (two) times daily. 09/02/20   Allred, Fayrene Fearing, MD  sertraline (ZOLOFT) 100 MG tablet Take 200 mg by mouth daily.     [provider]  Tamsulosin HCl (FLOMAX) 0.4 MG CAPS Take 1 capsule (0.4 mg total) by mouth every morning. 07/21/11   Norval Gable, PA-C  TRULICITY 3 MG/0.5ML SOPN once a week. 09/24/19   [provider]    Family History    Family History  Problem Relation Age of Onset   Lung cancer Mother    Heart disease Father    Heart attack Father    Healthy Sister    Healthy Daughter    Healthy Son    Anesthesia problems Neg Hx    Hypotension Neg Hx    Pseudochol deficiency  Neg Hx    He indicated that his mother is deceased. He indicated that his father is deceased. He indicated that his sister is alive. He indicated that his brother is deceased. He indicated that his maternal grandmother is deceased. He indicated that his maternal grandfather is deceased. He indicated that his paternal grandmother is deceased. He indicated that his paternal grandfather is deceased. He indicated that his daughter is alive. He indicated that his son is alive. He indicated that the status of his neg hx is unknown.   Social History    Social History   Socioeconomic History   Marital status: Widowed    Spouse name: jody   Number of children: 2   Years of education: college   Highest education level: Master's degree (e.g., MA, MS, MEng, MEd, MSW, MBA)  Occupational History   Occupation: retired  Tobacco Use   Smoking status: Never   Smokeless tobacco: Never  Vaping Use   Vaping status: Never Used  Substance and Sexual Activity   Alcohol use: No    Comment: occasional glass of wine   Drug use: No   Sexual activity: Not on file  Other Topics Concern   Not on file  Social History Narrative   Not on file   Social Determinants of Health   Financial Resource Strain: Not on file  Food Insecurity: No Food Insecurity (03/22/2022)   Hunger Vital Sign    Worried About Running Out of Food in the Last Year: Never true    Ran Out of Food in the Last Year: Never true  Transportation Needs: No Transportation Needs (03/22/2022)   PRAPARE - Administrator, Civil Service (Medical): No    Lack of Transportation (Non-Medical): No  Physical Activity: Not on file  Stress: Not on file  Social Connections: Not on file  Intimate Partner Violence: Not At Risk (03/23/2022)   Humiliation, Afraid, Rape, and Kick questionnaire    Fear of Current or Ex-Partner: No    Emotionally Abused: No    Physically Abused: No    Sexually Abused: No     Review of Systems    General:   No chills, fever, night sweats or weight changes.  Cardiovascular:  No chest pain, dyspnea on exertion, edema, orthopnea, palpitations, paroxysmal nocturnal dyspnea. Dermatological: No rash, lesions/masses Respiratory: No cough, dyspnea Urologic: No hematuria, dysuria Abdominal:   No nausea, vomiting,  diarrhea, bright red blood per rectum, melena, or hematemesis Neurologic:  No visual changes, wkns, changes in mental status. All other systems reviewed and are otherwise negative except as noted above.  Physical Exam    VS:  There were no vitals taken for this visit. , BMI There is no height or weight on file to calculate BMI. GEN: Well nourished, well developed, in no acute distress. HEENT: normal. Neck: Supple, no JVD, carotid bruits, or masses. Cardiac: RRR, no murmur, rubs, or gallops. No clubbing, cyanosis, edema.  Radials/DP/PT 2+ and equal bilaterally.  Respiratory:  Respirations regular and unlabored, clear to auscultation bilaterally. GI: Soft, nontender, nondistended, BS + x 4. MS: no deformity or atrophy. Skin: warm and dry, no rash. Neuro:  Strength and sensation are intact. Psych: Normal affect.  Accessory Clinical Findings    Recent Labs: 03/22/2022: TSH 3.342 09/16/2022: ALT 18; B Natriuretic Peptide 365.2; BUN 24; Creatinine, Ser 1.50; Hemoglobin 14.3; Platelets 182; Potassium 4.3; Sodium 137   Recent Lipid Panel    Component Value Date/Time   CHOL 141 03/23/2022 0253   TRIG 152 (H) 03/23/2022 0253   HDL 36 (L) 03/23/2022 0253   CHOLHDL 3.9 03/23/2022 0253   VLDL 30 03/23/2022 0253   LDLCALC 75 03/23/2022 0253    ECG personally reviewed by me today-none today.***  Echocardiogram 04/14/2020  IMPRESSIONS     1. Left ventricular ejection fraction, by estimation, is 40 to 45%. The  left ventricle has mildly decreased function. The left ventricle  demonstrates regional wall motion abnormalities (see scoring  diagram/findings for description). There is severe   concentric left ventricular hypertrophy. Left ventricular diastolic  parameters are consistent with Grade I diastolic dysfunction (impaired  relaxation).   2. Right ventricular systolic function is normal. The right ventricular  size is mildly enlarged.   3. Left atrial size was moderately dilated.   4. The mitral valve is grossly normal. Mild mitral valve regurgitation.   5. The aortic valve is tricuspid. Aortic valve regurgitation is trivial.  Moderate aortic valve stenosis. Aortic valve mean gradient measures 31.6  mmHg. Aortic valve Vmax measures 3.47 m/s.   6. Aortic dilatation noted. There is mild dilatation of the ascending  aorta, measuring 40 mm.   Comparison(s): A prior study was performed on 09/22/19. No significant  change from prior study. Prior images reviewed side by side.  Echo 09/27/21 IMPRESSIONS  1. Left ventricular ejection fraction, by estimation, is 40 to 45%. The left ventricle has mildly decreased function. The left ventricle demonstrates regional wall motion abnormalities. Septal/apical hypokinesis. There is moderate left ventricular  hypertrophy. Left ventricular diastolic parameters are consistent with Grade II diastolic dysfunction (pseudonormalization). Elevated left atrial pressure.  2. Right ventricular systolic function is normal. The right ventricular size is mildly enlarged. There is normal pulmonary artery systolic pressure. The estimated right ventricular systolic pressure is 28.6 mmHg.  3. Left atrial size was mildly dilated.  4. A small pericardial effusion is present.  5. The mitral valve is normal in structure. Mild mitral valve regurgitation. No evidence of mitral stenosis.  6. There is mild dilatation of the ascending aorta, measuring 39 mm.  7. The inferior vena cava is normal in size with greater than 50% respiratory variability, suggesting right atrial pressure of 3 mmHg.  8. There is a 29 mm Edwards Sapien 3 prosthetic (TAVR) valve present in  the aortic position. Procedure Date: 08/29/2021. Echo findings are consistent with normal structure and function of the aortic valve prosthesis. Trivial  aortic regurgitation. Vmax 2.4  m/s, MG 12 mmHg, EOA 2.3 cm^2, DI 0.46   ____________________   Echocardiogram 08/22/22:  1. Akinesis of the septum, distal inferior wall and apex; overall  moderate to severe LV dysfunction; s/p TAVR with mean gradient 4 mmHg, DI  0.53 and no AI; compared to 03/23/22 LV function is worse and pericardial  effusion slightly larger.   2. Left ventricular ejection fraction, by estimation, is 30 to 35%. The  left ventricle has moderate to severely decreased function. The left  ventricle demonstrates regional wall motion abnormalities (see scoring  diagram/findings for description). There  is mild concentric left ventricular hypertrophy. Left ventricular  diastolic parameters are consistent with Grade I diastolic dysfunction  (impaired relaxation). Elevated left atrial pressure.   3. Right ventricular systolic function is normal. The right ventricular  size is normal.   4. Left atrial size was mildly dilated.   5. A small pericardial effusion is present.   6. The mitral valve is normal in structure. Mild mitral valve  regurgitation. No evidence of mitral stenosis.   7. The aortic valve has been repaired/replaced. Aortic valve  regurgitation is not visualized. No aortic stenosis is present. There is a  29 mm Sapien prosthetic (TAVR) valve present in the aortic position.  Procedure Date: 08/29/2021. Echo findings are  consistent with normal structure and function of the aortic valve  prosthesis.   8. Aortic dilatation noted. There is mild dilatation of the ascending  aorta, measuring 40 mm.   Assessment & Plan   1.  Essential hypertension-BP today 124***/78.   Maintain blood pressure log Continue carvedilol, Entresto Heart healthy low-sodium diet-reviewed Maintain physical activity  Aortic stenosis-status  post TAVR on 08/29/2021.  Follow-up echocardiogram showed well-functioning TAVR valve.  Details above.  Continue current diet and physical activity Follows with structural heart team  Hyperlipidemia-LDL***78 on 03/15/21 Continue Plavix, atorvastatin Heart healthy low-sodium high-fiber diet  OSA-reports compliance with CPAP.  Waking up well rested. Continue CPAP use  Complete heart block-status post PPM.  Heart rate today***.  Denies episodes of lightheadedness presyncope or syncope.  Device interrogated 09/16/2022.  Lead parameters stable Follows with EP.  Type 2 diabetes-glucose***243 on 12/17/2019. 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  Neurology was consulted and he was started on clopidogrel at that time. Continue Plavix, atorvastatin  Disposition: Follow-up with Dr. Tresa Endo or me in 6 months.  Thomasene Ripple. Shaquita Fort NP-C    01/01/2023, 7:28 AM [facility] [address] Office [phone] Fax [phone]  Notice: This dictation was prepared with Dragon dictation along with smaller phrase technology. Any transcriptional errors that result from this process are unintentional and may not be corrected upon review.  I spent 14*** minutes examining this patient, reviewing medications, and using patient centered shared decision making involving her cardiac care.  Prior to her visit I spent greater than 20 minutes reviewing her past medical history,  medications, and prior cardiac tests.

## 2023-01-03 ENCOUNTER — Ambulatory Visit: Payer: Medicare PPO | Attending: General Practice | Admitting: General Practice

## 2023-01-04 ENCOUNTER — Encounter: Payer: Self-pay | Admitting: General Practice

## 2023-01-19 ENCOUNTER — Other Ambulatory Visit: Payer: Self-pay

## 2023-01-19 ENCOUNTER — Emergency Department (HOSPITAL_COMMUNITY): Payer: Medicare PPO

## 2023-01-19 ENCOUNTER — Encounter (HOSPITAL_COMMUNITY): Payer: Self-pay

## 2023-01-19 ENCOUNTER — Emergency Department (HOSPITAL_COMMUNITY)
Admission: EM | Admit: 2023-01-19 | Discharge: 2023-01-19 | Disposition: A | Discharge status: Home / Self Care | Payer: Medicare PPO | Source: Home / Self Care | Attending: Emergency Medicine | Admitting: Emergency Medicine

## 2023-01-19 DIAGNOSIS — R296 Repeated falls: Secondary | ICD-10-CM | POA: Diagnosis present

## 2023-01-19 DIAGNOSIS — M549 Dorsalgia, unspecified: Secondary | ICD-10-CM | POA: Diagnosis not present

## 2023-01-19 DIAGNOSIS — E119 Type 2 diabetes mellitus without complications: Secondary | ICD-10-CM | POA: Diagnosis present

## 2023-01-19 DIAGNOSIS — R2681 Unsteadiness on feet: Secondary | ICD-10-CM | POA: Diagnosis not present

## 2023-01-19 DIAGNOSIS — N400 Enlarged prostate without lower urinary tract symptoms: Secondary | ICD-10-CM | POA: Diagnosis present

## 2023-01-19 DIAGNOSIS — Z7984 Long term (current) use of oral hypoglycemic drugs: Secondary | ICD-10-CM | POA: Diagnosis not present

## 2023-01-19 DIAGNOSIS — J90 Pleural effusion, not elsewhere classified: Secondary | ICD-10-CM | POA: Diagnosis not present

## 2023-01-19 DIAGNOSIS — R918 Other nonspecific abnormal finding of lung field: Secondary | ICD-10-CM | POA: Diagnosis not present

## 2023-01-19 DIAGNOSIS — K219 Gastro-esophageal reflux disease without esophagitis: Secondary | ICD-10-CM | POA: Diagnosis present

## 2023-01-19 DIAGNOSIS — G8911 Acute pain due to trauma: Secondary | ICD-10-CM | POA: Diagnosis not present

## 2023-01-19 DIAGNOSIS — E538 Deficiency of other specified B group vitamins: Secondary | ICD-10-CM | POA: Diagnosis present

## 2023-01-19 DIAGNOSIS — Z950 Presence of cardiac pacemaker: Secondary | ICD-10-CM | POA: Diagnosis not present

## 2023-01-19 DIAGNOSIS — I5042 Chronic combined systolic (congestive) and diastolic (congestive) heart failure: Secondary | ICD-10-CM | POA: Diagnosis present

## 2023-01-19 DIAGNOSIS — J9811 Atelectasis: Secondary | ICD-10-CM | POA: Diagnosis not present

## 2023-01-19 DIAGNOSIS — W2209XA Striking against other stationary object, initial encounter: Secondary | ICD-10-CM | POA: Diagnosis present

## 2023-01-19 DIAGNOSIS — W1830XA Fall on same level, unspecified, initial encounter: Secondary | ICD-10-CM | POA: Diagnosis present

## 2023-01-19 DIAGNOSIS — S2242XD Multiple fractures of ribs, left side, subsequent encounter for fracture with routine healing: Secondary | ICD-10-CM | POA: Diagnosis not present

## 2023-01-19 DIAGNOSIS — K5900 Constipation, unspecified: Secondary | ICD-10-CM | POA: Diagnosis not present

## 2023-01-19 DIAGNOSIS — E039 Hypothyroidism, unspecified: Secondary | ICD-10-CM | POA: Diagnosis not present

## 2023-01-19 DIAGNOSIS — I700 Atherosclerosis of aorta: Secondary | ICD-10-CM | POA: Diagnosis not present

## 2023-01-19 DIAGNOSIS — I10 Essential (primary) hypertension: Secondary | ICD-10-CM | POA: Diagnosis not present

## 2023-01-19 DIAGNOSIS — W108XXA Fall (on) (from) other stairs and steps, initial encounter: Secondary | ICD-10-CM | POA: Insufficient documentation

## 2023-01-19 DIAGNOSIS — R739 Hyperglycemia, unspecified: Secondary | ICD-10-CM | POA: Diagnosis not present

## 2023-01-19 DIAGNOSIS — M47814 Spondylosis without myelopathy or radiculopathy, thoracic region: Secondary | ICD-10-CM | POA: Diagnosis not present

## 2023-01-19 DIAGNOSIS — F0390 Unspecified dementia without behavioral disturbance: Secondary | ICD-10-CM | POA: Diagnosis present

## 2023-01-19 DIAGNOSIS — G4730 Sleep apnea, unspecified: Secondary | ICD-10-CM | POA: Diagnosis present

## 2023-01-19 DIAGNOSIS — E871 Hypo-osmolality and hyponatremia: Secondary | ICD-10-CM | POA: Diagnosis present

## 2023-01-19 DIAGNOSIS — S2242XA Multiple fractures of ribs, left side, initial encounter for closed fracture: Secondary | ICD-10-CM | POA: Insufficient documentation

## 2023-01-19 DIAGNOSIS — R911 Solitary pulmonary nodule: Secondary | ICD-10-CM | POA: Diagnosis not present

## 2023-01-19 DIAGNOSIS — I110 Hypertensive heart disease with heart failure: Secondary | ICD-10-CM | POA: Diagnosis present

## 2023-01-19 DIAGNOSIS — W19XXXA Unspecified fall, initial encounter: Secondary | ICD-10-CM | POA: Diagnosis not present

## 2023-01-19 DIAGNOSIS — Z8249 Family history of ischemic heart disease and other diseases of the circulatory system: Secondary | ICD-10-CM | POA: Diagnosis not present

## 2023-01-19 DIAGNOSIS — Z7401 Bed confinement status: Secondary | ICD-10-CM | POA: Diagnosis not present

## 2023-01-19 DIAGNOSIS — E669 Obesity, unspecified: Secondary | ICD-10-CM | POA: Diagnosis present

## 2023-01-19 DIAGNOSIS — R0781 Pleurodynia: Secondary | ICD-10-CM | POA: Diagnosis not present

## 2023-01-19 DIAGNOSIS — E1129 Type 2 diabetes mellitus with other diabetic kidney complication: Secondary | ICD-10-CM | POA: Diagnosis not present

## 2023-01-19 DIAGNOSIS — Z6833 Body mass index (BMI) 33.0-33.9, adult: Secondary | ICD-10-CM | POA: Diagnosis not present

## 2023-01-19 DIAGNOSIS — F05 Delirium due to known physiological condition: Secondary | ICD-10-CM | POA: Diagnosis present

## 2023-01-19 DIAGNOSIS — I6782 Cerebral ischemia: Secondary | ICD-10-CM | POA: Diagnosis not present

## 2023-01-19 DIAGNOSIS — Z952 Presence of prosthetic heart valve: Secondary | ICD-10-CM | POA: Diagnosis not present

## 2023-01-19 DIAGNOSIS — E785 Hyperlipidemia, unspecified: Secondary | ICD-10-CM | POA: Diagnosis not present

## 2023-01-19 DIAGNOSIS — E890 Postprocedural hypothyroidism: Secondary | ICD-10-CM | POA: Diagnosis present

## 2023-01-19 DIAGNOSIS — Z91148 Patient's other noncompliance with medication regimen for other reason: Secondary | ICD-10-CM | POA: Diagnosis not present

## 2023-01-19 DIAGNOSIS — R6510 Systemic inflammatory response syndrome (SIRS) of non-infectious origin without acute organ dysfunction: Secondary | ICD-10-CM | POA: Diagnosis present

## 2023-01-19 DIAGNOSIS — S22049A Unspecified fracture of fourth thoracic vertebra, initial encounter for closed fracture: Secondary | ICD-10-CM | POA: Diagnosis not present

## 2023-01-19 DIAGNOSIS — G9389 Other specified disorders of brain: Secondary | ICD-10-CM | POA: Diagnosis not present

## 2023-01-19 DIAGNOSIS — G3184 Mild cognitive impairment, so stated: Secondary | ICD-10-CM | POA: Diagnosis not present

## 2023-01-19 DIAGNOSIS — Z8673 Personal history of transient ischemic attack (TIA), and cerebral infarction without residual deficits: Secondary | ICD-10-CM | POA: Diagnosis not present

## 2023-01-19 DIAGNOSIS — S0990XA Unspecified injury of head, initial encounter: Secondary | ICD-10-CM | POA: Diagnosis not present

## 2023-01-19 DIAGNOSIS — R0689 Other abnormalities of breathing: Secondary | ICD-10-CM | POA: Diagnosis not present

## 2023-01-19 DIAGNOSIS — Z79890 Hormone replacement therapy (postmenopausal): Secondary | ICD-10-CM | POA: Diagnosis not present

## 2023-01-19 DIAGNOSIS — G9340 Encephalopathy, unspecified: Secondary | ICD-10-CM | POA: Diagnosis not present

## 2023-01-19 DIAGNOSIS — Z794 Long term (current) use of insulin: Secondary | ICD-10-CM | POA: Diagnosis not present

## 2023-01-19 MED ORDER — OXYCODONE-ACETAMINOPHEN 5-325 MG PO TABS: 1.0000 | ORAL_TABLET | Freq: Four times a day (QID) | ORAL | 0 refills | Status: DC | PRN

## 2023-01-19 MED ORDER — OXYCODONE HCL 5 MG PO TABS: 5.0000 mg | ORAL_TABLET | Freq: Four times a day (QID) | ORAL | 0 refills | Status: DC | PRN

## 2023-01-19 MED ORDER — OXYCODONE HCL 5 MG PO TABS
5.0000 mg | ORAL_TABLET | Freq: Four times a day (QID) | ORAL | 0 refills | Status: DC | PRN
Start: 2023-01-19 — End: 2023-01-19

## 2023-01-19 MED ORDER — LIDOCAINE 5 % EX PTCH: 1.0000 | MEDICATED_PATCH | CUTANEOUS | 0 refills | Status: DC

## 2023-01-19 MED ORDER — ACETAMINOPHEN 500 MG PO TABS
1000.0000 mg | ORAL_TABLET | Freq: Once | ORAL | Status: AC
  Administered 2023-01-19: 1000 mg via ORAL
  Filled 2023-01-19: qty 2

## 2023-01-19 MED ORDER — OXYCODONE HCL 5 MG PO TABS
5.0000 mg | ORAL_TABLET | Freq: Once | ORAL | Status: AC
  Administered 2023-01-19: 5 mg via ORAL
  Filled 2023-01-19: qty 1

## 2023-01-19 NOTE — ED Provider Notes (Signed)
[place] EMERGENCY DEPARTMENT AT [facility] Provider Note   CSN: [id number] Arrival date & time: 01/19/23  1842     History Chief Complaint  Patient presents with   Fall   Rib Injury    HPI MARUIN EMBREE is a 72 y.o. male presenting for fall on thinners.  72 year old male denies fevers chills nausea vomiting at this time.  Did not hit his head.  Has no headache.  Feels at his mental status baseline.  Fell on the last stair grabbing the railing swinging to his side hitting his left thoracic cavity on the wall.  Has substantial pain at that site.  Ambulatory prior to arrival with severe discomfort at the site   Patient's recorded medical, surgical, social, medication list and allergies were reviewed in the Snapshot window as part of the initial history.   Review of Systems   Review of Systems  Constitutional:  Negative for chills and fever.  HENT:  Negative for ear pain and sore throat.   Eyes:  Negative for pain and visual disturbance.  Respiratory:  Negative for cough and shortness of breath.   Cardiovascular:  Positive for chest pain. Negative for palpitations.  Gastrointestinal:  Negative for abdominal pain and vomiting.  Genitourinary:  Negative for dysuria and hematuria.  Musculoskeletal:  Negative for arthralgias and back pain.  Skin:  Negative for color change and rash.  Neurological:  Negative for seizures and syncope.  All other systems reviewed and are negative.   Physical Exam Updated Vital Signs BP (!) 156/106 (BP Location: Right Arm)   Pulse 82   Temp 97.9 F (36.6 C) (Oral)   Resp 18   Ht 5\' 11"  (1.803 m)   Wt 108.9 kg   SpO2 98%   BMI 33.47 kg/m  Physical Exam Vitals and nursing note reviewed.  Constitutional:      General: He is not in acute distress.    Appearance: He is well-developed.  HENT:     Head: Normocephalic and atraumatic.  Eyes:     Conjunctiva/sclera: Conjunctivae normal.  Cardiovascular:     Rate and Rhythm: Normal  rate and regular rhythm.     Heart sounds: No murmur heard. Pulmonary:     Effort: Pulmonary effort is normal. No respiratory distress.     Breath sounds: Normal breath sounds.  Chest:     Chest wall: Tenderness present.  Abdominal:     Palpations: Abdomen is soft.     Tenderness: There is no abdominal tenderness.  Musculoskeletal:        General: No swelling.     Cervical back: Neck supple.  Skin:    General: Skin is warm and dry.     Capillary Refill: Capillary refill takes less than 2 seconds.  Neurological:     Mental Status: He is alert.  Psychiatric:        Mood and Affect: Mood normal.      ED Course/ Medical Decision Making/ A&P Clinical Course as of 01/19/23 2200  Sat Jan 19, 2023  2042 Fall at home. Coming sown the sairs Pain in left scapula. [CC]    Clinical Course User Index [CC] Glyn Ade, MD    Procedures Procedures   Medications Ordered in ED Medications  oxyCODONE (Oxy IR/ROXICODONE) immediate release tablet 5 mg (5 mg Oral Given 01/19/23 2130)  acetaminophen (TYLENOL) tablet 1,000 mg (1,000 mg Oral Given 01/19/23 2130)   Medical Decision Making:    TAZ WAGGLE is a 72  y.o. male who presented to the ED today with a moderate mechanisma trauma, detailed above.     Given this mechanism of trauma, a full physical exam was performed. Notably, patient was hemodynamically stable and acute distress.   Reviewed and confirmed nursing documentation for past medical history, family history, social history.    Initial Assessment/Plan:   This is a patient presenting with a moderate mechanism trauma.  As such, I have considered intracranial injuries including intracranial hemorrhage, intrathoracic injuries including blunt myocardial or blunt lung injury, blunt abdominal injuries including aortic dissection, bladder injury, spleen injury, liver injury and I have considered orthopedic injuries including extremity or spinal injury.  With the patient's  presentation of moderate mechanism trauma but an otherwise reassuring exam, patient warrants targeted evaluation for potential traumatic injuries. Will proceed with targeted evaluation for potential injuries. Will proceed with CT chest, targeted x-rays. Objective evaluation resulted with multiple rib fractures   Final Reassessment and Plan:   CT demonstrated multiple rib fractures.  Treated with pain medication and incentive spirometry in the emergency room. He expressed understanding of symptoms prompted.  Sent a pain medication for supportive care educated on use of this medication and risk of falls and they expressed understanding.  Disposition:  I have considered need for hospitalization, however, considering all of the above, I believe this patient is stable for discharge at this time.  Patient/family educated about specific return precautions for given chief complaint and symptoms.  Patient/family educated about follow-up with PCP.     Patient/family expressed understanding of return precautions and need for follow-up. Patient spoken to regarding all imaging and laboratory results and appropriate follow up for these results. All education provided in verbal form with additional information in written form. Time was allowed for answering of patient questions. Patient discharged.    Emergency Department Medication Summary:   Medications  oxyCODONE (Oxy IR/ROXICODONE) immediate release tablet 5 mg (5 mg Oral Given 01/19/23 2130)  acetaminophen (TYLENOL) tablet 1,000 mg (1,000 mg Oral Given 01/19/23 2130)         Clinical Impression:  1. Fall, initial encounter      Data Unavailable   Final Clinical Impression(s) / ED Diagnoses Final diagnoses:  Fall, initial encounter    Rx / DC Orders ED Discharge Orders          Ordered    lidocaine (LIDODERM) 5 %  Every 24 hours        01/19/23 2200              Glyn Ade, MD 01/19/23 2340

## 2023-01-19 NOTE — ED Triage Notes (Signed)
Pt BIBA from home. C/o mech fall, with pain in L ribs, and L upper back. No head trauma, no LOC. Pt is on Plavix.  AOx4

## 2023-01-21 ENCOUNTER — Encounter (HOSPITAL_COMMUNITY): Payer: Self-pay

## 2023-01-21 ENCOUNTER — Emergency Department (HOSPITAL_COMMUNITY): Payer: Medicare PPO

## 2023-01-21 ENCOUNTER — Inpatient Hospital Stay (HOSPITAL_COMMUNITY)
Admission: EM | Admit: 2023-01-21 | Discharge: 2023-01-24 | DRG: 184 | Disposition: A | Discharge status: Skilled Nursing Facility | Payer: Medicare PPO | Attending: Internal Medicine | Admitting: Internal Medicine

## 2023-01-21 ENCOUNTER — Other Ambulatory Visit: Payer: Self-pay

## 2023-01-21 DIAGNOSIS — K219 Gastro-esophageal reflux disease without esophagitis: Secondary | ICD-10-CM | POA: Diagnosis present

## 2023-01-21 DIAGNOSIS — W1830XA Fall on same level, unspecified, initial encounter: Secondary | ICD-10-CM | POA: Diagnosis present

## 2023-01-21 DIAGNOSIS — R296 Repeated falls: Secondary | ICD-10-CM | POA: Diagnosis present

## 2023-01-21 DIAGNOSIS — K5900 Constipation, unspecified: Secondary | ICD-10-CM | POA: Diagnosis not present

## 2023-01-21 DIAGNOSIS — G3184 Mild cognitive impairment, so stated: Secondary | ICD-10-CM | POA: Diagnosis present

## 2023-01-21 DIAGNOSIS — N400 Enlarged prostate without lower urinary tract symptoms: Secondary | ICD-10-CM | POA: Diagnosis present

## 2023-01-21 DIAGNOSIS — Z7984 Long term (current) use of oral hypoglycemic drugs: Secondary | ICD-10-CM

## 2023-01-21 DIAGNOSIS — F0390 Unspecified dementia without behavioral disturbance: Secondary | ICD-10-CM | POA: Diagnosis present

## 2023-01-21 DIAGNOSIS — Z794 Long term (current) use of insulin: Secondary | ICD-10-CM

## 2023-01-21 DIAGNOSIS — E119 Type 2 diabetes mellitus without complications: Secondary | ICD-10-CM | POA: Diagnosis present

## 2023-01-21 DIAGNOSIS — Z888 Allergy status to other drugs, medicaments and biological substances status: Secondary | ICD-10-CM

## 2023-01-21 DIAGNOSIS — E039 Hypothyroidism, unspecified: Secondary | ICD-10-CM | POA: Diagnosis present

## 2023-01-21 DIAGNOSIS — Z950 Presence of cardiac pacemaker: Secondary | ICD-10-CM

## 2023-01-21 DIAGNOSIS — E871 Hypo-osmolality and hyponatremia: Secondary | ICD-10-CM | POA: Diagnosis present

## 2023-01-21 DIAGNOSIS — Z6833 Body mass index (BMI) 33.0-33.9, adult: Secondary | ICD-10-CM

## 2023-01-21 DIAGNOSIS — R6510 Systemic inflammatory response syndrome (SIRS) of non-infectious origin without acute organ dysfunction: Secondary | ICD-10-CM | POA: Diagnosis present

## 2023-01-21 DIAGNOSIS — Z7982 Long term (current) use of aspirin: Secondary | ICD-10-CM

## 2023-01-21 DIAGNOSIS — E890 Postprocedural hypothyroidism: Secondary | ICD-10-CM | POA: Diagnosis present

## 2023-01-21 DIAGNOSIS — Z91148 Patient's other noncompliance with medication regimen for other reason: Secondary | ICD-10-CM

## 2023-01-21 DIAGNOSIS — Y92009 Unspecified place in unspecified non-institutional (private) residence as the place of occurrence of the external cause: Principal | ICD-10-CM

## 2023-01-21 DIAGNOSIS — Z952 Presence of prosthetic heart valve: Secondary | ICD-10-CM

## 2023-01-21 DIAGNOSIS — E669 Obesity, unspecified: Secondary | ICD-10-CM | POA: Diagnosis present

## 2023-01-21 DIAGNOSIS — F05 Delirium due to known physiological condition: Secondary | ICD-10-CM | POA: Diagnosis present

## 2023-01-21 DIAGNOSIS — W2209XA Striking against other stationary object, initial encounter: Secondary | ICD-10-CM | POA: Diagnosis present

## 2023-01-21 DIAGNOSIS — E538 Deficiency of other specified B group vitamins: Secondary | ICD-10-CM | POA: Diagnosis present

## 2023-01-21 DIAGNOSIS — I110 Hypertensive heart disease with heart failure: Secondary | ICD-10-CM | POA: Diagnosis present

## 2023-01-21 DIAGNOSIS — Z85850 Personal history of malignant neoplasm of thyroid: Secondary | ICD-10-CM

## 2023-01-21 DIAGNOSIS — E1165 Type 2 diabetes mellitus with hyperglycemia: Principal | ICD-10-CM

## 2023-01-21 DIAGNOSIS — W19XXXA Unspecified fall, initial encounter: Principal | ICD-10-CM

## 2023-01-21 DIAGNOSIS — G4730 Sleep apnea, unspecified: Secondary | ICD-10-CM | POA: Diagnosis present

## 2023-01-21 DIAGNOSIS — Z8673 Personal history of transient ischemic attack (TIA), and cerebral infarction without residual deficits: Secondary | ICD-10-CM

## 2023-01-21 DIAGNOSIS — Z885 Allergy status to narcotic agent status: Secondary | ICD-10-CM

## 2023-01-21 DIAGNOSIS — Z79890 Hormone replacement therapy (postmenopausal): Secondary | ICD-10-CM

## 2023-01-21 DIAGNOSIS — I5042 Chronic combined systolic (congestive) and diastolic (congestive) heart failure: Secondary | ICD-10-CM | POA: Diagnosis present

## 2023-01-21 DIAGNOSIS — Z79899 Other long term (current) drug therapy: Secondary | ICD-10-CM

## 2023-01-21 DIAGNOSIS — S2242XA Multiple fractures of ribs, left side, initial encounter for closed fracture: Principal | ICD-10-CM | POA: Diagnosis present

## 2023-01-21 DIAGNOSIS — Z8249 Family history of ischemic heart disease and other diseases of the circulatory system: Secondary | ICD-10-CM

## 2023-01-21 DIAGNOSIS — Z7902 Long term (current) use of antithrombotics/antiplatelets: Secondary | ICD-10-CM

## 2023-01-21 DIAGNOSIS — Z7985 Long-term (current) use of injectable non-insulin antidiabetic drugs: Secondary | ICD-10-CM

## 2023-01-21 DIAGNOSIS — S2249XA Multiple fractures of ribs, unspecified side, initial encounter for closed fracture: Secondary | ICD-10-CM | POA: Diagnosis present

## 2023-01-21 LAB — CBC WITH DIFFERENTIAL/PLATELET
Abs Immature Granulocytes: 0.1 10*3/uL — ABNORMAL HIGH (ref 0.00–0.07)
Basophils Absolute: 0.1 10*3/uL (ref 0.0–0.1)
Basophils Relative: 1 %
Eosinophils Absolute: 0.1 10*3/uL (ref 0.0–0.5)
Eosinophils Relative: 1 %
HCT: 43.9 % (ref 39.0–52.0)
Hemoglobin: 14.7 g/dL (ref 13.0–17.0)
Immature Granulocytes: 1 %
Lymphocytes Relative: 11 %
Lymphs Abs: 1.4 10*3/uL (ref 0.7–4.0)
MCH: 29.3 pg (ref 26.0–34.0)
MCHC: 33.5 g/dL (ref 30.0–36.0)
MCV: 87.6 fL (ref 80.0–100.0)
Monocytes Absolute: 0.9 10*3/uL (ref 0.1–1.0)
Monocytes Relative: 7 %
Neutro Abs: 9.9 10*3/uL — ABNORMAL HIGH (ref 1.7–7.7)
Neutrophils Relative %: 79 %
Platelets: 173 10*3/uL (ref 150–400)
RBC: 5.01 MIL/uL (ref 4.22–5.81)
RDW: 14.1 % (ref 11.5–15.5)
WBC: 12.5 10*3/uL — ABNORMAL HIGH (ref 4.0–10.5)
nRBC: 0 % (ref 0.0–0.2)

## 2023-01-21 LAB — COMPREHENSIVE METABOLIC PANEL
ALT: 13 U/L (ref 0–44)
AST: 15 U/L (ref 15–41)
Albumin: 4.1 g/dL (ref 3.5–5.0)
Alkaline Phosphatase: 56 U/L (ref 38–126)
Anion gap: 11 (ref 5–15)
BUN: 19 mg/dL (ref 8–23)
CO2: 22 mmol/L (ref 22–32)
Calcium: 8.7 mg/dL — ABNORMAL LOW (ref 8.9–10.3)
Chloride: 101 mmol/L (ref 98–111)
Creatinine, Ser: 1.19 mg/dL (ref 0.61–1.24)
GFR, Estimated: >60 mL/min (ref >60)
Glucose, Bld: 260 mg/dL — ABNORMAL HIGH (ref 70–99)
Potassium: 3.8 mmol/L (ref 3.5–5.1)
Sodium: 134 mmol/L — ABNORMAL LOW (ref 135–145)
Total Bilirubin: 3 mg/dL — ABNORMAL HIGH (ref 0.3–1.2)
Total Protein: 7.7 g/dL (ref 6.5–8.1)

## 2023-01-21 NOTE — ED Triage Notes (Addendum)
BIB EMS from home for fall off of last 2 steps on Saturday. Pt was evaluated after this fall and has broken ribs on left side. Pt has been having pain and muscle spasms since d/c and is worsening. Pt had tylenol, ibuprofen, flexeril, and oxycodone today at 1930. Family requests pt to be admitted

## 2023-01-21 NOTE — ED Provider Triage Note (Signed)
Emergency Medicine Provider Triage Evaluation Note  [name] , a 72 y.o. male  was evaluated in triage.  Pt complains of rib pain. Patient was seen in the ER 01/19/23 after a fall on plavix. Found to have rib fx x 2, dc home with meds and incentive spirometer.  Here with daughter who has just flown into town, states patient with new diagnosis dementia, not taking meds reliably, unable to control pain due to lack of adherence to meds. Has been unable to care for self at home. No new falls. No abdominal pain, no bowel movement today. Daughter notes finding patient in extreme pain on her arrival at his apartment, provided with Flexeril and oxy, pain now improved, still winces in pain at times.   Review of Systems  Positive:  Negative:   Physical Exam  BP (!) 185/116 (BP Location: Left Arm)   Pulse (!) 40   Temp 98.3 F (36.8 C) (Oral)   Resp 16   Ht 5\' 11"  (1.803 m)   Wt 108 kg   SpO2 96%   BMI 33.21 kg/m  Gen:   Awake, no distress   Resp:  Normal effort  MSK:   Moves extremities without difficulty  Other:  Mild left side abdominal tenderness   Medical Decision Making  Medically screening exam initiated at 9:28 PM.  Appropriate orders placed.  AKITO LOPERA was informed that the remainder of the evaluation will be completed by another provider, this initial triage assessment does not replace that evaluation, and the importance of remaining in the ED until their evaluation is complete.     Jeannie Fend, PA-C 01/21/23 2130

## 2023-01-22 ENCOUNTER — Emergency Department (HOSPITAL_COMMUNITY): Payer: Medicare PPO

## 2023-01-22 ENCOUNTER — Ambulatory Visit: Payer: Medicare PPO | Attending: General Practice | Admitting: Cardiovascular Disease

## 2023-01-22 DIAGNOSIS — I5042 Chronic combined systolic (congestive) and diastolic (congestive) heart failure: Secondary | ICD-10-CM | POA: Diagnosis present

## 2023-01-22 DIAGNOSIS — F05 Delirium due to known physiological condition: Secondary | ICD-10-CM | POA: Diagnosis present

## 2023-01-22 DIAGNOSIS — S2249XA Multiple fractures of ribs, unspecified side, initial encounter for closed fracture: Secondary | ICD-10-CM | POA: Diagnosis present

## 2023-01-22 DIAGNOSIS — E890 Postprocedural hypothyroidism: Secondary | ICD-10-CM | POA: Diagnosis present

## 2023-01-22 DIAGNOSIS — G4730 Sleep apnea, unspecified: Secondary | ICD-10-CM | POA: Diagnosis present

## 2023-01-22 DIAGNOSIS — K219 Gastro-esophageal reflux disease without esophagitis: Secondary | ICD-10-CM

## 2023-01-22 DIAGNOSIS — Z7984 Long term (current) use of oral hypoglycemic drugs: Secondary | ICD-10-CM | POA: Diagnosis not present

## 2023-01-22 DIAGNOSIS — E538 Deficiency of other specified B group vitamins: Secondary | ICD-10-CM | POA: Diagnosis present

## 2023-01-22 DIAGNOSIS — F0390 Unspecified dementia without behavioral disturbance: Secondary | ICD-10-CM | POA: Diagnosis present

## 2023-01-22 DIAGNOSIS — N400 Enlarged prostate without lower urinary tract symptoms: Secondary | ICD-10-CM | POA: Diagnosis present

## 2023-01-22 DIAGNOSIS — W1830XA Fall on same level, unspecified, initial encounter: Secondary | ICD-10-CM | POA: Diagnosis present

## 2023-01-22 DIAGNOSIS — R296 Repeated falls: Secondary | ICD-10-CM

## 2023-01-22 DIAGNOSIS — G9340 Encephalopathy, unspecified: Secondary | ICD-10-CM

## 2023-01-22 DIAGNOSIS — Z79890 Hormone replacement therapy (postmenopausal): Secondary | ICD-10-CM | POA: Diagnosis not present

## 2023-01-22 DIAGNOSIS — E119 Type 2 diabetes mellitus without complications: Secondary | ICD-10-CM | POA: Diagnosis present

## 2023-01-22 DIAGNOSIS — Z8249 Family history of ischemic heart disease and other diseases of the circulatory system: Secondary | ICD-10-CM | POA: Diagnosis not present

## 2023-01-22 DIAGNOSIS — Z950 Presence of cardiac pacemaker: Secondary | ICD-10-CM | POA: Diagnosis not present

## 2023-01-22 DIAGNOSIS — S2242XA Multiple fractures of ribs, left side, initial encounter for closed fracture: Secondary | ICD-10-CM | POA: Diagnosis present

## 2023-01-22 DIAGNOSIS — W2209XA Striking against other stationary object, initial encounter: Secondary | ICD-10-CM | POA: Diagnosis present

## 2023-01-22 DIAGNOSIS — Z6833 Body mass index (BMI) 33.0-33.9, adult: Secondary | ICD-10-CM | POA: Diagnosis not present

## 2023-01-22 DIAGNOSIS — Z952 Presence of prosthetic heart valve: Secondary | ICD-10-CM | POA: Diagnosis not present

## 2023-01-22 DIAGNOSIS — R6510 Systemic inflammatory response syndrome (SIRS) of non-infectious origin without acute organ dysfunction: Secondary | ICD-10-CM | POA: Diagnosis present

## 2023-01-22 DIAGNOSIS — I110 Hypertensive heart disease with heart failure: Secondary | ICD-10-CM | POA: Diagnosis present

## 2023-01-22 DIAGNOSIS — Z794 Long term (current) use of insulin: Secondary | ICD-10-CM | POA: Diagnosis not present

## 2023-01-22 DIAGNOSIS — E871 Hypo-osmolality and hyponatremia: Secondary | ICD-10-CM | POA: Diagnosis present

## 2023-01-22 DIAGNOSIS — K5900 Constipation, unspecified: Secondary | ICD-10-CM | POA: Diagnosis not present

## 2023-01-22 DIAGNOSIS — E669 Obesity, unspecified: Secondary | ICD-10-CM | POA: Diagnosis present

## 2023-01-22 DIAGNOSIS — E039 Hypothyroidism, unspecified: Secondary | ICD-10-CM | POA: Diagnosis not present

## 2023-01-22 DIAGNOSIS — Z91148 Patient's other noncompliance with medication regimen for other reason: Secondary | ICD-10-CM | POA: Diagnosis not present

## 2023-01-22 LAB — CBG MONITORING, ED
Glucose-Capillary: 236 mg/dL — ABNORMAL HIGH (ref 70–99)
Glucose-Capillary: 250 mg/dL — ABNORMAL HIGH (ref 70–99)

## 2023-01-22 MED ORDER — LORAZEPAM 2 MG/ML IJ SOLN: 2.0000 mg | Freq: Once | INTRAMUSCULAR | Status: DC

## 2023-01-22 MED ORDER — ACETAMINOPHEN 10 MG/ML IV SOLN: 1000.0000 mg | Freq: Four times a day (QID) | INTRAVENOUS | Status: DC | PRN

## 2023-01-22 MED ORDER — INSULIN ASPART 100 UNIT/ML IJ SOLN
0.0000 [IU] | Freq: Every day | INTRAMUSCULAR | Status: DC
  Administered 2023-01-23: 2 [IU] via SUBCUTANEOUS
  Administered 2023-01-23: 3 [IU] via SUBCUTANEOUS
  Filled 2023-01-22: qty 0.05

## 2023-01-22 MED ORDER — OXYCODONE-ACETAMINOPHEN 5-325 MG PO TABS
1.0000 | ORAL_TABLET | Freq: Once | ORAL | Status: AC
  Administered 2023-01-22: 1 via ORAL
  Filled 2023-01-22: qty 1

## 2023-01-22 MED ORDER — HALOPERIDOL LACTATE 5 MG/ML IJ SOLN
2.0000 mg | Freq: Once | INTRAMUSCULAR | Status: AC
  Administered 2023-01-22: 2 mg via INTRAMUSCULAR
  Filled 2023-01-22: qty 1

## 2023-01-22 MED ORDER — MELATONIN 3 MG PO TABS: 3.0000 mg | ORAL_TABLET | Freq: Every evening | ORAL | Status: DC | PRN

## 2023-01-22 MED ORDER — ONDANSETRON HCL 4 MG/2ML IJ SOLN: 4.0000 mg | Freq: Four times a day (QID) | INTRAMUSCULAR | Status: DC | PRN

## 2023-01-22 MED ORDER — ASPIRIN 81 MG PO TBEC
81.0000 mg | DELAYED_RELEASE_TABLET | Freq: Every day | ORAL | Status: DC
  Administered 2023-01-22 – 2023-01-24 (×3): 81 mg via ORAL
  Filled 2023-01-22 (×3): qty 1

## 2023-01-22 MED ORDER — LIDOCAINE 5 % EX PTCH
1.0000 | MEDICATED_PATCH | CUTANEOUS | Status: DC
  Administered 2023-01-22 – 2023-01-23 (×2): 1 via TRANSDERMAL
  Filled 2023-01-22 (×2): qty 1

## 2023-01-22 MED ORDER — ACETAMINOPHEN 650 MG RE SUPP: 650.0000 mg | Freq: Four times a day (QID) | RECTAL | Status: DC | PRN

## 2023-01-22 MED ORDER — LEVOTHYROXINE SODIUM 50 MCG PO TABS: 175.0000 ug | ORAL_TABLET | Freq: Every day | ORAL | Status: DC

## 2023-01-22 MED ORDER — ACETAMINOPHEN 500 MG PO TABS: 1000.0000 mg | ORAL_TABLET | Freq: Four times a day (QID) | ORAL | Status: DC | PRN

## 2023-01-22 MED ORDER — CLOPIDOGREL BISULFATE 75 MG PO TABS
75.0000 mg | ORAL_TABLET | Freq: Every day | ORAL | Status: DC
  Administered 2023-01-23: 75 mg via ORAL
  Filled 2023-01-22 (×2): qty 1

## 2023-01-22 MED ORDER — SACUBITRIL-VALSARTAN 24-26 MG PO TABS
1.0000 | ORAL_TABLET | Freq: Every day | ORAL | Status: DC
  Administered 2023-01-23 – 2023-01-24 (×2): 1 via ORAL
  Filled 2023-01-22 (×3): qty 1

## 2023-01-22 MED ORDER — MEMANTINE HCL 10 MG PO TABS
10.0000 mg | ORAL_TABLET | Freq: Two times a day (BID) | ORAL | Status: DC
  Administered 2023-01-22 – 2023-01-24 (×4): 10 mg via ORAL
  Filled 2023-01-22: qty 1
  Filled 2023-01-22 (×2): qty 2
  Filled 2023-01-22: qty 1

## 2023-01-22 MED ORDER — LEVOTHYROXINE SODIUM 50 MCG PO TABS
175.0000 ug | ORAL_TABLET | Freq: Every day | ORAL | Status: DC
  Administered 2023-01-23: 175 ug via ORAL
  Filled 2023-01-22: qty 1

## 2023-01-22 MED ORDER — FINERENONE 10 MG PO TABS
1.0000 | ORAL_TABLET | Freq: Every day | ORAL | Status: DC
  Administered 2023-01-23 – 2023-01-24 (×2): 10 mg via ORAL
  Filled 2023-01-22 (×3): qty 1

## 2023-01-22 MED ORDER — OXYCODONE-ACETAMINOPHEN 5-325 MG PO TABS
1.0000 | ORAL_TABLET | Freq: Four times a day (QID) | ORAL | Status: DC | PRN
  Administered 2023-01-22: 1 via ORAL
  Filled 2023-01-22: qty 1

## 2023-01-22 MED ORDER — HALOPERIDOL LACTATE 5 MG/ML IJ SOLN
2.0000 mg | Freq: Once | INTRAMUSCULAR | Status: AC
  Administered 2023-01-23: 2 mg via INTRAMUSCULAR
  Filled 2023-01-22: qty 1

## 2023-01-22 MED ORDER — INSULIN ASPART 100 UNIT/ML IJ SOLN
0.0000 [IU] | Freq: Three times a day (TID) | INTRAMUSCULAR | Status: DC
  Administered 2023-01-22 – 2023-01-23 (×4): 5 [IU] via SUBCUTANEOUS
  Administered 2023-01-24 (×2): 3 [IU] via SUBCUTANEOUS
  Filled 2023-01-22: qty 0.15

## 2023-01-22 MED ORDER — ACETAMINOPHEN 325 MG PO TABS: 650.0000 mg | ORAL_TABLET | Freq: Four times a day (QID) | ORAL | Status: DC | PRN

## 2023-01-22 MED ORDER — INSULIN ASPART FLEXPEN 100 UNIT/ML ~~LOC~~ SOPN: 5.0000 [IU] | PEN_INJECTOR | Freq: Every day | SUBCUTANEOUS | Status: DC

## 2023-01-22 MED ORDER — TAMSULOSIN HCL 0.4 MG PO CAPS
0.4000 mg | ORAL_CAPSULE | Freq: Every morning | ORAL | Status: DC
  Administered 2023-01-22 – 2023-01-24 (×3): 0.4 mg via ORAL
  Filled 2023-01-22 (×3): qty 1

## 2023-01-22 MED ORDER — ATORVASTATIN CALCIUM 40 MG PO TABS
40.0000 mg | ORAL_TABLET | Freq: Every day | ORAL | Status: DC
  Administered 2023-01-22 – 2023-01-24 (×3): 40 mg via ORAL
  Filled 2023-01-22 (×3): qty 1

## 2023-01-22 MED ORDER — CANAGLIFLOZIN 100 MG PO TABS
300.0000 mg | ORAL_TABLET | Freq: Every day | ORAL | Status: DC
  Filled 2023-01-22: qty 3

## 2023-01-22 MED ORDER — SERTRALINE HCL 100 MG PO TABS
200.0000 mg | ORAL_TABLET | Freq: Every day | ORAL | Status: DC
  Administered 2023-01-22 – 2023-01-24 (×3): 200 mg via ORAL
  Filled 2023-01-22: qty 4
  Filled 2023-01-22: qty 2
  Filled 2023-01-22: qty 4

## 2023-01-22 MED ORDER — DIPHENHYDRAMINE HCL 50 MG/ML IJ SOLN: 25.0000 mg | Freq: Once | INTRAMUSCULAR | Status: DC

## 2023-01-22 MED ORDER — FAMOTIDINE 20 MG PO TABS
20.0000 mg | ORAL_TABLET | Freq: Every day | ORAL | Status: DC
  Administered 2023-01-22 – 2023-01-24 (×3): 20 mg via ORAL
  Filled 2023-01-22 (×3): qty 1

## 2023-01-22 NOTE — Progress Notes (Addendum)
At this time patient is being admitted. AUTH was started for [facility] at [facility] at this time AUTH is still pending. Please follow up with facility to make them aware of patient's change in medical status If patient is in need of SNF after medically clear AUTH as well bed offers may have to be re-started in the event [facility] does not have a bed. This CSW did reach out to the patient's daughter to inform her that her father was being admitted. TOC will follow up with patient and family on the floor.

## 2023-01-22 NOTE — Progress Notes (Addendum)
CSW spoke with pt at bedside to inform of process and answer any questions. Pt verbalized understanding of SNF process. Pt and sister at bedside.   Addend @ 11:52 AM Presented bed offer for [facility]. Pt accepted. Auth initiated.

## 2023-01-22 NOTE — Evaluation (Addendum)
Occupational Therapy Evaluation Patient Details Name: Tony [name] MRN: [id number] DOB: 27-Jun-1950 Today's Date: 01/22/2023   History of Present Illness Patient is a 72 year old male presenting to ED with pain. ED visit 9/21 after sustaining a fall. Imaging (+) acute rib fxs. Discharged home. Presented to ED again on 9/23 with uncontrolled pain. Pt sustained a fall while in ED. Imaging (+) acute rib fxs. Hx of DM, HB, pacemaker, HF, hypothyroidism, OSA, neuropathy   Clinical Impression   Patient is a 72 year old male who presented for above. Patient was living at home alone with 15 steps to enter house with no AD at baseline. Currently, patient is limited by pain with patient unable to participate in LB Adls without max A. Patient noted to have poor safety awareness, decreased knowledge of AD/AE impacting independence in Adls. Patients sister is unable to physically help patient. Patient will benefit from continued inpatient follow up therapy, <3 hours/day.        If plan is discharge home, recommend the following: A lot of help with bathing/dressing/bathroom;Direct supervision/assist for medications management;A lot of help with walking and/or transfers;Assistance with cooking/housework;Assist for transportation    Functional Status Assessment  Patient has had a recent decline in their functional status and demonstrates the ability to make significant improvements in function in a reasonable and predictable amount of time.  Equipment Recommendations  BSC/3in1;Other (comment) (RW)       Precautions / Restrictions Precautions Precautions: Fall Restrictions Weight Bearing Restrictions: No      Mobility Bed Mobility               General bed mobility comments: patient was sitting edge of stretcher with PT at end of PT session. patient was left sitting in recliner in ED at end of session.        Balance Overall balance assessment: Needs assistance, History of Falls    Sitting balance-Leahy Scale: Fair     Standing balance support: Bilateral upper extremity supported, During functional activity, Reliant on assistive device for balance Standing balance-Leahy Scale: Poor         ADL either performed or assessed with clinical judgement   ADL Overall ADL's : Needs assistance/impaired Eating/Feeding: Set up;Sitting   Grooming: Set up;Sitting Grooming Details (indicate cue type and reason): declined to participate with therapist reporting " i have one at home" Upper Body Bathing: Minimal assistance;Sitting   Lower Body Bathing: Sitting/lateral leans;Maximal assistance Lower Body Bathing Details (indicate cue type and reason): patient is unable to complete figure four positioning. patient was educated on avoiding bending and twisting to reduce back and rib pain. patient verbalized understanding. Upper Body Dressing : Minimal assistance;Sitting Upper Body Dressing Details (indicate cue type and reason): pain with reaching arms up with simulated task. Lower Body Dressing: Total assistance   Toilet Transfer: Minimal assistance;Rolling walker (2 wheels);Ambulation Toilet Transfer Details (indicate cue type and reason): from ED stretcher to recliner chair. patient noted to pick walker up on turns and needed cues to keep on ground and keep feet between back posts. Toileting- Clothing Manipulation and Hygiene: Sit to/from stand;Maximal assistance         General ADL Comments: patient reporetd he just needed to be able to walk more. patient was educated that walking more was one thing.      Pertinent Vitals/Pain Pain Assessment Pain Assessment: 0-10 Pain Score: 6  Pain Location: L side along ribs Pain Descriptors / Indicators: Grimacing, Sharp, Cramping Pain Intervention(s): Limited activity within  patient's tolerance, Monitored during session, Premedicated before session, Repositioned     Extremity/Trunk Assessment Upper Extremity Assessment Upper  Extremity Assessment: Overall WFL for tasks assessed   Lower Extremity Assessment Lower Extremity Assessment: Defer to PT evaluation   Cervical / Trunk Assessment Cervical / Trunk Assessment: Normal   Communication Communication Communication: No apparent difficulties Cueing Techniques: Verbal cues   Cognition Arousal: Alert Behavior During Therapy: WFL for tasks assessed/performed, Restless Overall Cognitive Status: Difficult to assess         General Comments: patients sister was present during session. difficult to ask cog assessment questions in croweded ED hallway. patient was noted to have some short term and safety cocnerns during session. further cog assessment warranted for next session if can be completed in a room.                Home Living Family/patient expects to be discharged to:: Unsure Living Arrangements: Alone Available Help at Discharge: Family;Available PRN/intermittently Type of Home: Apartment Home Access: Stairs to enter Entrance Stairs-Number of Steps: 15 Entrance Stairs-Rails: Right;Left;Can reach both Home Layout: One level     Bathroom Shower/Tub: Chief Strategy Officer: Standard     Home Equipment: Other (comment);Grab bars - tub/shower;Rolling Walker (2 wheels)   Additional Comments: walking stick (ski poles) and grabbars on steps      Prior Functioning/Environment Prior Level of Function : Independent/Modified Independent;History of Falls (last six months);Driving             Mobility Comments: no AD but has recently been unstable and has been using a walking stick. ADLs Comments: no assist for ADL, IADL        OT Problem List: Decreased strength;Decreased activity tolerance;Decreased safety awareness;Impaired balance (sitting and/or standing);Decreased knowledge of use of DME or AE;Decreased knowledge of precautions      OT Treatment/Interventions: Self-care/ADL training;Therapeutic exercise;Therapeutic  activities;Neuromuscular education;Energy conservation;DME and/or AE instruction;Patient/family education    OT Goals(Current goals can be found in the care plan section) Acute Rehab OT Goals Patient Stated Goal: to get to walk more. OT Goal Formulation: With patient/family Time For Goal Achievement: 02/05/23 Potential to Achieve Goals: Fair ADL Goals Pt Will Perform Lower Body Dressing: with supervision;sit to/from stand;with adaptive equipment Pt Will Transfer to Toilet: with supervision;ambulating;regular height toilet Pt Will Perform Toileting - Clothing Manipulation and hygiene: with supervision  OT Frequency: Min 1X/week       AM-PAC OT "6 Clicks" Daily Activity     Outcome Measure Help from another person eating meals?: None Help from another person taking care of personal grooming?: A Little Help from another person toileting, which includes using toliet, bedpan, or urinal?: A Lot Help from another person bathing (including washing, rinsing, drying)?: A Lot Help from another person to put on and taking off regular upper body clothing?: A Little Help from another person to put on and taking off regular lower body clothing?: A Lot 6 Click Score: 16   End of Session Equipment Utilized During Treatment: Gait belt;Rolling walker (2 wheels)  Activity Tolerance: Patient tolerated treatment well Patient left: in chair;with call bell/phone within reach;Other (comment) (with sister in ED hallway where patients bed is)  OT Visit Diagnosis: Unsteadiness on feet (R26.81);Other abnormalities of gait and mobility (R26.89);Muscle weakness (generalized) (M62.81)                Time: 1030-1045 OT Time Calculation (min): 15 min Charges:  OT General Charges $OT Visit: 1 Visit OT Evaluation $OT Eval  Low Complexity: 1 Low  Jermany Rimel OTR/L, MS Acute Rehabilitation Department Office# [phone]   Selinda Flavin 01/22/2023, 1:06 PM

## 2023-01-22 NOTE — ED Notes (Signed)
Pt meds from home delivered to pharmacy Kerendia 10 mg tablets x 30

## 2023-01-22 NOTE — Progress Notes (Signed)
PT Cancellation Note  Patient Details Name: Tony [name] MRN: [id number] DOB: 1951-01-10   Cancelled Treatment:    Reason Eval/Treat Not Completed: Pain limiting ability to participate Attempted PT eval-pt unable to participate 2* pain. He reports he has not received any pain meds this morning. Spoke with RN--she states she will give him oral meds. Will check back later today.    Faye Ramsay, PT Acute Rehabilitation  Office: [phone]

## 2023-01-22 NOTE — Progress Notes (Signed)
Pt instructed on flutter use.  Pt demonstrated well but unable to do more than 3 times due to rib pain.  Pt did not have any questions at this time.

## 2023-01-22 NOTE — H&P (Signed)
History and Physical      Tony [name]:[id number] DOB: [birth year] DOA: 01/21/2023; DOS: 01/22/2023  PCP: Emilio Aspen, MD *** Patient coming from: home ***  I have personally briefly reviewed patient's old medical records in [facility]  Chief Complaint: ***  HPI: Tony [name] is a 72 y.o. male with medical history significant for *** who is admitted to [facility] on 01/21/2023 with *** after presenting from home*** to [facility] ED complaining of ***.   ***        ***  ED Course:  Vital signs in the ED were notable for the following: ***  Labs were notable for the following: ***  Per my interpretation, EKG in ED demonstrated the following:  ***  Imaging in the ED, per corresponding formal radiology read, was notable for the following: ***  While in the ED, the following were administered: ***  Subsequently, the patient was admitted  ***  ***red   Review of Systems: As per HPI otherwise 10 point review of systems negative.   Past Medical History:  Diagnosis Date   Cancer [facility])    thyroid   CHF (congestive heart failure) (HCC)    Complete heart block (HCC)    a. 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 Excell Seltzer and Dr. Laneta Simmers   Severe aortic stenosis    Sleep apnea     Past Surgical History:  Procedure Laterality Date   CARDIAC CATHETERIZATION     INTRAOPERATIVE TRANSTHORACIC ECHOCARDIOGRAM N/A 08/29/2021   Procedure: INTRAOPERATIVE TRANSTHORACIC ECHOCARDIOGRAM;  Surgeon: Tonny Bollman, MD;  Location: [facility] INVASIVE CV LAB;  Service: Open Heart Surgery;   Laterality: N/A;   LUMBAR LAMINECTOMY/DECOMPRESSION MICRODISCECTOMY  07/19/2011   Procedure: LUMBAR LAMINECTOMY/DECOMPRESSION MICRODISCECTOMY 3 LEVELS;  Surgeon: Venita Lick, MD;  Location: [facility] OR;  Service: Orthopedics;  Laterality: Left;  Lumbar three-Lumbar five LEFT DECOMPRESSION AND FORAMINOTOMY Lumbar three-four LEFT DISCECTOMY   PERMANENT PACEMAKER INSERTION N/A 08/11/2014   MDT Adapta L implanted by Dr Johney Frame for CHB   RIGHT HEART CATH AND CORONARY ANGIOGRAPHY N/A 08/07/2021   Procedure: RIGHT HEART CATH AND CORONARY ANGIOGRAPHY;  Surgeon: Tonny Bollman, MD;  Location: [facility] INVASIVE CV LAB;  Service: Cardiovascular;  Laterality: N/A;   RIGHT/LEFT HEART CATH AND CORONARY ANGIOGRAPHY N/A 08/07/2021   Procedure: RIGHT/LEFT HEART CATH AND CORONARY ANGIOGRAPHY;  Surgeon: Tonny Bollman, MD;  Location: [facility] INVASIVE CV LAB;  Service: Cardiovascular;  Laterality: N/A;   Thyroidectomy x2     TRANSCATHETER AORTIC VALVE REPLACEMENT, TRANSFEMORAL N/A 08/29/2021   Procedure: Transcatheter Aortic Valve Replacement, Transfemoral;  Surgeon: Tonny Bollman, MD;  Location: [facility] INVASIVE CV LAB;  Service: Open Heart Surgery;  Laterality: N/A;    Social History:  reports that he has never smoked. He has never used smokeless tobacco. He reports that he does not drink alcohol and does not use drugs.   Allergies  Allergen Reactions   Codeine Itching   Crestor [Rosuvastatin Calcium] Other (See Comments)    Leg pain   Metformin Hcl Diarrhea  Simvastatin Other (See Comments)    Leg pain    Family History  Problem Relation Age of Onset   Lung cancer Mother    Heart disease Father    Heart attack Father    Healthy Sister    Healthy Daughter    Healthy Son    Anesthesia problems Neg Hx    Hypotension Neg Hx    Pseudochol deficiency Neg Hx     Family history reviewed and not pertinent ***   Prior to Admission medications   Medication Sig Start Date End Date Taking? Authorizing Provider   atorvastatin (LIPITOR) 40 MG tablet Take 40 mg by mouth daily. 07/06/21  Yes [provider]  canagliflozin (INVOKANA) 300 MG TABS tablet Take 300 mg by mouth daily before breakfast. 07/07/21  Yes [provider]  carvedilol (COREG) 3.125 MG tablet Take 1 tablet (3.125 mg total) by mouth 2 (two) times daily with a meal. 10/11/21  Yes Sheilah Pigeon, PA-C  clopidogrel (PLAVIX) 75 MG tablet Take 1 tablet (75 mg total) by mouth daily. 05/31/22  Yes Levert Feinstein, MD  cyclobenzaprine (FLEXERIL) 10 MG tablet Take 10 mg by mouth daily as needed for muscle spasms.   Yes [provider]  famotidine (PEPCID) 20 MG tablet Take 20 mg by mouth daily as needed for heartburn or indigestion.   Yes [provider]  gabapentin (NEURONTIN) 100 MG capsule Take 100 mg by mouth at bedtime as needed (for pain).   Yes [provider]  insulin glargine (LANTUS SOLOSTAR) 100 UNIT/ML Solostar Pen Inject 30 Units into the skin 2 (two) times daily. 01/26/21  Yes [provider]  KERENDIA 10 MG TABS Take 10 mg by mouth daily. 12/25/22  Yes [provider]  levothyroxine (SYNTHROID) 175 MCG tablet Take 175 mcg by mouth daily before breakfast. 08/25/21  Yes [provider]  magnesium oxide (MAG-OX) 400 (240 Mg) MG tablet Take 400 mg by mouth at bedtime as needed (for leg cramps).   Yes [provider]  memantine (NAMENDA) 10 MG tablet Take 1 tablet (10 mg total) by mouth 2 (two) times daily. Patient taking differently: Take 10 mg by mouth See admin instructions. Take 10 mg by mouth one to two times a day 12/31/22  Yes McCue, Shanda Bumps, NP  MUCINEX 600 MG 12 hr tablet Take 600 mg by mouth 2 (two) times daily as needed for to loosen phlegm or cough.   Yes [provider]  oxyCODONE (OXY IR/ROXICODONE) 5 MG immediate release tablet Take 5 mg by mouth every 6 (six) hours as needed for severe pain.   Yes [provider]  OZEMPIC, 0.25 OR 0.5 MG/DOSE,  2 MG/3ML SOPN Inject 0.25 mg into the skin every Monday.   Yes [provider]  PRESCRIPTION MEDICATION 1 Device See admin instructions. CPAP- At bedtime and during naps   Yes [provider]  sacubitril-valsartan (ENTRESTO) 24-26 MG Take 1 tablet by mouth daily. 08/22/22  Yes Georgie Chard D, NP  sertraline (ZOLOFT) 100 MG tablet Take 200 mg by mouth daily.    Yes [provider]  Tamsulosin HCl (FLOMAX) 0.4 MG CAPS Take 1 capsule (0.4 mg total) by mouth every morning. 07/21/11  Yes Kovach, Diana, PA-C  TYLENOL 500 MG tablet Take 500-1,000 mg by mouth every 6 (six) hours as needed for mild pain or headache.   Yes [provider]  Lancets (ONETOUCH DELICA PLUS LANCET33G) MISC Apply topically. 02/20/21   [provider]  lidocaine (LIDODERM) 5 % Place 1 patch onto the skin daily. Remove & Discard patch within 12 hours or as directed by MD 01/19/23   Glyn Ade, MD  memantine [facility] TITRATION PAK) tablet pack 5 mg/day for =1 week; 5 mg twice daily for =1 week; 15 mg/day given in 5 mg and 10 mg separated doses for =1 week; then 10 mg twice daily Patient not taking: Reported on 01/22/2023 11/29/22 01/22/23  Ihor Austin, NP  oxyCODONE-acetaminophen (PERCOCET/ROXICET) 5-325 MG tablet Take 1 tablet by mouth every 6 (six) hours as needed for severe pain. Patient not taking: Reported on 01/22/2023 01/19/23   Glyn Ade, MD     Objective    Physical Exam: Vitals:   01/22/23 0036 01/22/23 0249 01/22/23 0602 01/22/23 0917  BP:  (!) 168/103 (!) 192/105 (!) 202/118  Pulse: (!) 105 73 69 93  Resp: 18 20 20 20   Temp:  99 F (37.2 C) (!) 97.4 F (36.3 C) 99.5 F (37.5 C)  TempSrc:  Oral  Oral  SpO2: 100% 93% 95% 92%  Weight:      Height:        General: appears to be stated age; alert, oriented Skin: warm, dry, no rash Head:  AT/[place] Mouth:  Oral mucosa membranes appear moist, normal dentition Neck: supple; trachea midline Heart:  RRR; did  not appreciate any M/R/G Lungs: CTAB, did not appreciate any wheezes, rales, or rhonchi Abdomen: + BS; soft, ND, NT Vascular: 2+ pedal pulses b/l; 2+ radial pulses b/l Extremities: no peripheral edema, no muscle wasting Neuro: strength and sensation intact in upper and lower extremities b/l    *** Neuro: 5/5 strength of the proximal and distal flexors and extensors of the upper and lower extremities bilaterally; sensation intact in upper and lower extremities b/l; cranial nerves II through XII grossly intact; no pronator drift; no evidence suggestive of slurred speech, dysarthria, or facial droop; Normal muscle tone. No tremors. *** Neuro: In the setting of the patient's current mental status and associated inability to follow instructions, unable to perform full neurologic exam at this time.  As such, assessment of strength, sensation, and cranial nerves is limited at this time. Patient noted to spontaneously move all 4 extremities. No tremors.  ***    Labs on Admission: I have personally reviewed following labs and imaging studies  CBC: Recent Labs  Lab 01/21/23 2146  WBC 12.5*  NEUTROABS 9.9*  HGB 14.7  HCT 43.9  MCV 87.6  PLT 173   Basic Metabolic Panel: Recent Labs  Lab 01/21/23 2146  NA 134*  K 3.8  CL 101  CO2 22  GLUCOSE 260*  BUN 19  CREATININE 1.19  CALCIUM 8.7*   GFR: Estimated Creatinine Clearance: 69.1 mL/min (by C-G formula based on SCr of 1.19 mg/dL). Liver Function Tests: Recent Labs  Lab 01/21/23 2146  AST 15  ALT 13  ALKPHOS 56  BILITOT 3.0*  PROT 7.7  ALBUMIN 4.1   No results for input(s): "LIPASE", "AMYLASE" in the last 168 hours. No results for input(s): "AMMONIA" in the last 168 hours. Coagulation Profile: No results for input(s): "INR", "PROTIME" in the last 168 hours. Cardiac Enzymes: No results for input(s): "CKTOTAL", "CKMB", "CKMBINDEX", "TROPONINI" in the last 168 hours. BNP (last 3 results) No results for input(s): "PROBNP"  in the last 8760 hours. HbA1C: No results for input(s): "HGBA1C" in the last 72 hours. CBG: Recent Labs  Lab 01/22/23 1213 01/22/23 1834  GLUCAP 236* 250*   Lipid Profile:  No results for input(s): "CHOL", "HDL", "LDLCALC", "TRIG", "CHOLHDL", "LDLDIRECT" in the last 72 hours. Thyroid Function Tests: No results for input(s): "TSH", "T4TOTAL", "FREET4", "T3FREE", "THYROIDAB" in the last 72 hours. Anemia Panel: No results for input(s): "VITAMINB12", "FOLATE", "FERRITIN", "TIBC", "IRON", "RETICCTPCT" in the last 72 hours. Urine analysis:    Component Value Date/Time   COLORURINE YELLOW 09/16/2022 1948   APPEARANCEUR CLEAR 09/16/2022 1948   LABSPEC 1.022 09/16/2022 1948   PHURINE 7.0 09/16/2022 1948   GLUCOSEU >=500 (A) 09/16/2022 1948   HGBUR NEGATIVE 09/16/2022 1948   BILIRUBINUR NEGATIVE 09/16/2022 1948   KETONESUR NEGATIVE 09/16/2022 1948   PROTEINUR >=300 (A) 09/16/2022 1948   NITRITE NEGATIVE 09/16/2022 1948   LEUKOCYTESUR NEGATIVE 09/16/2022 1948    Radiological Exams on Admission: CT Chest Wo Contrast  Result Date: 01/22/2023 CLINICAL DATA:  Chest trauma, blunt. EXAM: CT CHEST WITHOUT CONTRAST TECHNIQUE: Multidetector CT imaging of the chest was performed following the standard protocol without IV contrast. RADIATION DOSE REDUCTION: This exam was performed according to the departmental dose-optimization program which includes automated exposure control, adjustment of the mA and/or kV according to patient size and/or use of iterative reconstruction technique. COMPARISON:  01/19/2023. FINDINGS: Cardiovascular: Heart is enlarged and there is a trace pericardial effusion. Pacemaker leads are present in the heart. A TAVR stent is noted. Multi-vessel coronary artery calcifications are noted. 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 The pulmonary trunk is normal in caliber. Mediastinum/Nodes: No mediastinal or  axillary lymphadenopathy. Evaluation of the hila is limited due to lack of IV contrast. The trachea and esophagus are within normal limits. There is a small hiatal hernia. Lungs/Pleura: There are trace bilateral pleural effusions. Atelectasis is noted bilaterally. There is a stable 4 mm nodule in the left upper lobe, axial image 56. No pneumothorax. Upper Abdomen: No acute abnormality. Musculoskeletal: A pacemaker device is noted in the anterior chest wall on the left. Degenerative changes are present in the thoracic spine. There are fractures of the T4, T5, T6, T7, and T8 ribs on the left. IMPRESSION: 1. Fractures of the T4 through T8 ribs on the left. 2. Trace bilateral pleural effusions with atelectasis. 3. Stable 4 mm left upper lobe pulmonary nodule. No follow-up needed if patient is low-risk.This recommendation follows the consensus statement: Guidelines for Management of Incidental Pulmonary Nodules Detected on CT Images: From the Fleischner Society 2017; Radiology 2017; 284:228-243. 4. Aortic atherosclerosis and coronary artery calcifications. Electronically Signed   By: Thornell Sartorius M.D.   On: 01/22/2023 04:40   CT Head Wo Contrast  Result Date: 01/22/2023 CLINICAL DATA:  Minor head trauma EXAM: CT HEAD WITHOUT CONTRAST TECHNIQUE: Contiguous axial images were obtained from the base of the skull through the vertex without intravenous contrast. RADIATION DOSE REDUCTION: This exam was performed according to the departmental dose-optimization program which includes automated exposure control, adjustment of the mA and/or kV according to patient size and/or use of iterative reconstruction technique. COMPARISON:  09/16/2022 FINDINGS: Brain: No evidence of acute infarction, hemorrhage, hydrocephalus, extra-axial collection or mass lesion/mass effect. Generalized atrophy with ventriculomegaly. Mild chronic small vessel ischemia in the cerebral white matter. Vascular: No hyperdense vessel or unexpected  calcification. Skull: Normal. Negative for fracture or focal lesion. Sinuses/Orbits: No acute finding. IMPRESSION: No acute or reversible finding. Electronically Signed   By: Tiburcio Pea M.D.   On: 01/22/2023 04:00   DG Ribs Unilateral W/Chest Left  Result Date: 01/21/2023 CLINICAL DATA:  Fall, known fracture, worsening pain. Shortness of breath, difficulty moving. EXAM: LEFT RIBS AND CHEST - 3+ VIEW COMPARISON:  01/19/2023. FINDINGS: There is partial visualization of a T5 rib fracture on the left. The T4 rib fracture is not seen due to overlying pacemaker. There are new rib fractures at T7 and T8 on the left. There is no evidence of pneumothorax or pleural effusion. Atelectasis is noted at the lung bases bilaterally. Heart size and mediastinal contours are stable. A TAVR stent is noted. A dual lead pacemaker is present over the left chest. IMPRESSION: 1. New rib fractures at T7 and T8 on the left. 2. Known left T5 rib fracture is seen. The previously described T4 rib fracture is not seen due to overlying pacemaker. 3. Atelectasis at the lung bases. Electronically Signed   By: Thornell Sartorius M.D.   On: 01/21/2023 23:27      Assessment/Plan    Principal Problem:   Multiple rib fractures  ***              ***                ***               ***               ***               ***              ***               ***               ***               ***               ***               ***               ***              ***     DVT prophylaxis: SCD's ***  Code Status: Full code*** Family Communication: none*** Disposition Plan: Per Rounding Team Consults called: none***;  Admission status: ***    I SPENT GREATER THAN 75 *** MINUTES IN CLINICAL CARE TIME/MEDICAL DECISION-MAKING IN COMPLETING THIS  ADMISSION.     Chaney Born Amorina Doerr DO [facility] From 7PM - 7AM   01/22/2023, 10:45 PM   ***

## 2023-01-22 NOTE — ED Provider Notes (Signed)
[place] EMERGENCY DEPARTMENT AT [facility] Provider Note   CSN: [id number] Arrival date & time: 01/21/23  2055     History  Chief complaint: Tony [name] is a 72 y.o. male.  The history is provided by the patient.  He has history of hypertension, diabetes, complete heart block with pacemaker in place, combined systolic and diastolic heart failure, GERD and comes in because of a fall at home.  He had been in the ED 2 days ago with a fall and noted to have rib fractures.  He was sent home with prescription for oxycodone-acetaminophen.  Tonight, he did note that he got at least temporary relief of pain with oxycodone-acetaminophen.  He states that he was coming in because pain got worse to where it was no longer being adequately controlled.  He does not recall falling at home.  A rib x-ray was ordered here from triage.   Home Medications Prior to Admission medications   Medication Sig Start Date End Date Taking? Authorizing Provider  aspirin EC 81 MG tablet Take 81 mg by mouth daily. 10/24/22   [provider]  atorvastatin (LIPITOR) 40 MG tablet Take 40 mg by mouth daily. 07/06/21   [provider]  BD PEN NEEDLE NANO 2ND GEN 32G X 4 MM MISC  01/26/21   [provider]  canagliflozin (INVOKANA) 300 MG TABS tablet Take 300 mg by mouth daily before breakfast. 07/07/21   [provider]  carvedilol (COREG) 3.125 MG tablet Take 1 tablet (3.125 mg total) by mouth 2 (two) times daily with a meal. Patient not taking: Reported on 09/16/2022 10/11/21   Sheilah Pigeon, PA-C  cephALEXin (KEFLEX) 500 MG capsule Take 500 mg by mouth 2 (two) times daily. 12/15/22   [provider]  clopidogrel (PLAVIX) 75 MG tablet Take 1 tablet (75 mg total) by mouth daily. 05/31/22   Levert Feinstein, MD  Continuous Blood Gluc Receiver (FREESTYLE LIBRE 2 READER) DEVI by Does not apply route.    [provider]  cyclobenzaprine (FLEXERIL) 10 MG tablet  Take 10 mg by mouth 3 (three) times daily as needed for muscle spasms.    [provider]  famotidine (PEPCID) 20 MG tablet Take 20 mg by mouth daily.    [provider]  gabapentin (NEURONTIN) 100 MG capsule Take 2 capsules (200 mg total) by mouth at bedtime. Patient not taking: Reported on 09/07/2022 02/24/19   Pollyann Savoy, MD  Insulin Aspart FlexPen (NOVOLOG) 100 UNIT/ML Inject 5 Units into the skin daily. 08/24/21   [provider]  insulin glargine (LANTUS SOLOSTAR) 100 UNIT/ML Solostar Pen Inject 40 Units into the skin 2 (two) times daily. Patient not taking: Reported on 11/29/2022 01/26/21   [provider]  KERENDIA 10 MG TABS Take 1 tablet by mouth daily. 12/25/22   [provider]  Lancets [facility] DELICA PLUS [place]) MISC Apply topically. 02/20/21   [provider]  levothyroxine (SYNTHROID) 175 MCG tablet Take 175 mcg by mouth daily before breakfast. 08/25/21   [provider]  lidocaine (LIDODERM) 5 % Place 1 patch onto the skin daily. Remove & Discard patch within 12 hours or as directed by MD 01/19/23   Glyn Ade, MD  memantine [facility] TITRATION PAK) tablet pack 5 mg/day for =1 week; 5 mg twice daily for =1 week; 15 mg/day given in 5 mg and 10 mg separated doses for =1 week; then 10 mg twice daily 11/29/22 12/30/22  Ihor Austin,  NP  memantine (NAMENDA) 10 MG tablet Take 1 tablet (10 mg total) by mouth 2 (two) times daily. 12/31/22   Ihor Austin, NP  omeprazole (PRILOSEC) 10 MG capsule Take 10 mg by mouth daily.    [provider]  oxyCODONE-acetaminophen (PERCOCET/ROXICET) 5-325 MG tablet Take 1 tablet by mouth every 6 (six) hours as needed for severe pain. 01/19/23   Glyn Ade, MD  OZEMPIC, 0.25 OR 0.5 MG/DOSE, 2 MG/3ML SOPN Inject 0.25 mg into the skin every 7 (seven) days.    [provider]  sacubitril-valsartan (ENTRESTO) 24-26 MG Take 1 tablet by mouth daily. 08/22/22   Georgie Chard D, NP  sertraline (ZOLOFT) 100 MG tablet Take 200 mg by mouth daily.     [provider]  Tamsulosin HCl (FLOMAX) 0.4 MG CAPS Take 1 capsule (0.4 mg total) by mouth every morning. 07/21/11   Norval Gable, PA-C      Allergies    Corticosteroids, Codeine, Crestor [rosuvastatin calcium], Metformin hcl, and Simvastatin    Review of Systems   Review of Systems  All other systems reviewed and are negative.   Physical Exam Updated Vital Signs BP (!) 155/104 (BP Location: Left Arm)   Pulse (!) 105   Temp 98.9 F (37.2 C) (Oral)   Resp 18   Ht 5\' 11"  (1.803 m)   Wt 108 kg   SpO2 100%   BMI 33.21 kg/m  Physical Exam Vitals and nursing note reviewed.   72 year old male, resting comfortably and in no acute distress. Vital signs are significant for elevated blood pressure and borderline elevated heart rate. Oxygen saturation is 100%, which is normal. Head is normocephalic and atraumatic. PERRLA, EOMI. Oropharynx is clear. Neck is nontender and supple without adenopathy or JVD. Back is nontender and there is no CVA tenderness. Lungs are clear without rales, wheezes, or rhonchi.  Breath sounds are equal. Chest is moderately tender in the left posterior lateral rib cage.  There is no crepitus. Heart has regular rate and rhythm without murmur. Abdomen is soft, flat, nontender. Extremities have no cyanosis or edema, full range of motion is present. Skin is warm and dry without rash. Neurologic: Mental status is normal, cranial nerves are intact, moves all extremities equally.  ED Results / Procedures / Treatments   Labs (all labs ordered are listed, but only abnormal results are displayed) Labs Reviewed  CBC WITH DIFFERENTIAL/PLATELET - Abnormal; Notable for the following components:      Result Value   WBC 12.5 (*)    Neutro Abs 9.9 (*)    Abs Immature Granulocytes 0.10 (*)    All other components within normal limits  COMPREHENSIVE METABOLIC PANEL - Abnormal; Notable  for the following components:   Sodium 134 (*)    Glucose, Bld 260 (*)    Calcium 8.7 (*)    Total Bilirubin 3.0 (*)    All other components within normal limits    EKG None  Radiology DG Ribs Unilateral W/Chest Left  Result Date: 01/21/2023 CLINICAL DATA:  Fall, known fracture, worsening pain. Shortness of breath, difficulty moving. EXAM: LEFT RIBS AND CHEST - 3+ VIEW COMPARISON:  01/19/2023. FINDINGS: There is partial visualization of a T5 rib fracture on the left. The T4 rib fracture is not seen due to overlying pacemaker. There are new rib fractures at T7 and T8 on the left. There is no evidence of pneumothorax or pleural effusion. Atelectasis is noted at the lung bases bilaterally. Heart size and mediastinal  contours are stable. A TAVR stent is noted. A dual lead pacemaker is present over the left chest. IMPRESSION: 1. New rib fractures at T7 and T8 on the left. 2. Known left T5 rib fracture is seen. The previously described T4 rib fracture is not seen due to overlying pacemaker. 3. Atelectasis at the lung bases. Electronically Signed   By: Thornell Sartorius M.D.   On: 01/21/2023 23:27    Procedures Procedures    Medications Ordered in ED Medications - No data to display  ED Course/ Medical Decision Making/ A&P                                 Medical Decision Making Amount and/or Complexity of Data Reviewed Radiology: ordered.   Fall at home while on narcotics.  Unfortunately, he has no memory of the fall.  He does take antiplatelet agents (clopidogrel).  X-rays obtained at triage showed known fracture of T5 and new fractures of T7 and 8.  While in the waiting room waiting to be seen, patient slipped and fell and landed on his left side.  This was after the x-ray had been obtained.  Given his episode of amnesia, I will have ordered a CT of his head and I have ordered a new CT of his chest to evaluate his rib fractures.  I have reviewed his past records and do note ED visit on  01/19/2023 with diagnosis of 2 rib fractures on the left.  I have reviewed his laboratory tests, and my interpretation is mild hyponatremia which is not felt to be clinically significant, mild leukocytosis which is nonspecific.  CT scan of head shows no acute injury, CT scan of chest shows fractures of ribs 4 through 8 on the left.  I have independently viewed the images, and agree with radiologist's interpretation.  I have requested transitions of care consult to set up appropriate physical therapy, Occupational Therapy and home health services.  Case is signed out to Dr. Andria Meuse, Oncoming physician.  Final Clinical Impression(s) / ED Diagnoses Final diagnoses:  Fall at home, initial encounter  Fall during current hospitalization, initial encounter  Closed fracture of five ribs, left, initial encounter  Hyponatremia    Rx / DC Orders ED Discharge Orders     None         Dione Booze, MD 01/22/23 [phone]

## 2023-01-22 NOTE — Evaluation (Signed)
Physical Therapy Evaluation Patient Details Name: Tony [name] MRN: [id number] DOB: 03-Jun-1950 Today's Date: 01/22/2023  History of Present Illness  72 yo male presenting to ED with pain. ED visit 9/21 after sustaining a fall. Imaging (+) acute rib fxs. Discharged home. Presented to ED again on 9/23 with uncontrolled pain. Pt sustained a fall while in ED. Imaging (+) acute rib fxs. Hx of DM, HB, pacemaker, HF, hypothyroidism, OSA, neuropathy  Clinical Impression  On eval in ED, pt required Mod A for bed mobility, Min A for transfers/ambulation. Moderate pain with mobility after pre-medication. Pt presents with general weakness, decreased activity tolerance, and impaired gait and balance. He remains at risk for further falls when mobilizing. He has not demonstrated ability to safely manage his own care at home where he lives alone. Patient will benefit from continued inpatient follow up therapy, <3 hours/day.         If plan is discharge home, recommend the following: Assistance with cooking/housework;Assist for transportation;Help with stairs or ramp for entrance;A lot of help with bathing/dressing/bathroom;A little help with walking and/or transfers   Can travel by private vehicle   No    Equipment Recommendations  (TBD at next venue)  Recommendations for Other Services  OT consult    Functional Status Assessment Patient has had a recent decline in their functional status and demonstrates the ability to make significant improvements in function in a reasonable and predictable amount of time.     Precautions / Restrictions Precautions Precautions: Fall Restrictions Weight Bearing Restrictions: No      Mobility  Bed Mobility Overal bed mobility: Needs Assistance Bed Mobility: Supine to Sit     Supine to sit: Mod assist     General bed mobility comments: Assist for trunk and bil LEs. Increased time. Pt insistent on performing task in his own manner initially-ended up really  close to edge of stretcher. Cues provided for safety, technique.    Transfers Overall transfer level: Needs assistance Equipment used: Rolling walker (2 wheels) Transfers: Sit to/from Stand Sit to Stand: Min assist           General transfer comment: Assist to rise, stabilize, control descent. Cues for safety, technique, hand placement.    Ambulation/Gait Ambulation/Gait assistance: Min assist Gait Distance (Feet): 40 Feet Assistive device: Rolling walker (2 wheels) Gait Pattern/deviations: Ataxic       General Gait Details: Gait mildly ataxic, especially L LE. Cues for safety. Assist to stabilize pt throughout distance. Fall risk.  Stairs            Wheelchair Mobility     Tilt Bed    Modified Rankin (Stroke Patients Only)       Balance Overall balance assessment: Needs assistance, History of Falls         Standing balance support: Bilateral upper extremity supported, During functional activity, Reliant on assistive device for balance Standing balance-Leahy Scale: Poor                               Pertinent Vitals/Pain Pain Assessment Pain Assessment: 0-10 Pain Score: 6  Pain Location: L side along ribs Pain Descriptors / Indicators: Grimacing, Sharp, Cramping Pain Intervention(s): Limited activity within patient's tolerance, Monitored during session, Repositioned, Premedicated before session    Home Living Family/patient expects to be discharged to:: Unsure Living Arrangements: Alone Available Help at Discharge: Family;Available PRN/intermittently Type of Home: Apartment Home Access: Stairs to enter Entrance Stairs-Rails:  Right;Left;Can reach both Entrance Stairs-Number of Steps: 15   Home Layout: One level Home Equipment: Other (comment);Grab bars - tub/shower;Rolling Walker (2 wheels) Additional Comments: walking stick (ski poles) and grabbars on steps    Prior Function Prior Level of Function : Independent/Modified  Independent;History of Falls (last six months);Driving             Mobility Comments: no AD but has recently been unstable and has been using a walking stick. ADLs Comments: no assist for ADL, IADL     Extremity/Trunk Assessment   Upper Extremity Assessment Upper Extremity Assessment: Defer to OT evaluation    Lower Extremity Assessment Lower Extremity Assessment: Generalized weakness (hx of neuropathy)    Cervical / Trunk Assessment Cervical / Trunk Assessment: Normal  Communication   Communication Communication: No apparent difficulties Cueing Techniques: Verbal cues  Cognition Arousal: Alert Behavior During Therapy: WFL for tasks assessed/performed, Restless;Anxious Overall Cognitive Status: Within Functional Limits for tasks assessed Requires cues for safety                                          General Comments      Exercises     Assessment/Plan    PT Assessment Patient needs continued PT services  PT Problem List Decreased strength;Decreased range of motion;Decreased balance;Decreased mobility;Decreased activity tolerance;Decreased knowledge of use of DME;Pain;Obesity       PT Treatment Interventions DME instruction;Stair training;Gait training;Functional mobility training;Therapeutic activities;Therapeutic exercise;Balance training;Patient/family education    PT Goals (Current goals can be found in the Care Plan section)  Acute Rehab PT Goals Patient Stated Goal: less pain. to be able to move PT Goal Formulation: With patient Time For Goal Achievement: 02/05/23 Potential to Achieve Goals: Good    Frequency Min 1X/week     Co-evaluation               AM-PAC PT "6 Clicks" Mobility  Outcome Measure Help needed turning from your back to your side while in a flat bed without using bedrails?: A Lot Help needed moving from lying on your back to sitting on the side of a flat bed without using bedrails?: A Lot Help needed  moving to and from a bed to a chair (including a wheelchair)?: A Little Help needed standing up from a chair using your arms (e.g., wheelchair or bedside chair)?: A Little Help needed to walk in hospital room?: A Little Help needed climbing 3-5 steps with a railing? : A Lot 6 Click Score: 15    End of Session Equipment Utilized During Treatment: Gait belt Activity Tolerance: Patient tolerated treatment well;Patient limited by pain Patient left: with call bell/phone within reach;with family/visitor present (with OT sitting on side of stretcher)   PT Visit Diagnosis: Pain;History of falling (Z91.81);Repeated falls (R29.6);Difficulty in walking, not elsewhere classified (R26.2) Pain - part of body:  (ribs)    Time: 1610-9604 PT Time Calculation (min) (ACUTE ONLY): 17 min   Charges:   PT Evaluation $PT Eval Low Complexity: 1 Low   PT General Charges $$ ACUTE PT VISIT: 1 Visit           Faye Ramsay, PT Acute Rehabilitation  Office: [phone]

## 2023-01-22 NOTE — ED Provider Notes (Signed)
  Physical Exam  BP (!) 202/118 (BP Location: Left Arm)   Pulse 93   Temp 99.5 F (37.5 C) (Oral)   Resp 20   Ht 5\' 11"  (1.803 m)   Wt 108 kg   SpO2 92%   BMI 33.21 kg/m   Physical Exam  Procedures  Procedures  ED Course / MDM    Medical Decision Making Amount and/or Complexity of Data Reviewed Radiology: ordered.  Risk OTC drugs. Prescription drug management.   Tony [name], assumed care for this patient.  In brief this is a 72 year old male has had multiple falls recently.  Has 5 or fractures on the left side.  Patient was signed out pending PT and case management evaluation.  PT recommending SNF placement.  Have ordered patient's home medications, placed him into boarder status.       Tony Simmonds T, DO 01/22/23 1258

## 2023-01-22 NOTE — ED Notes (Signed)
Pt wet himself while attempting to use the urinal, family requested that he not be changed until they got back so he doesn't have to be moved so much.

## 2023-01-22 NOTE — Progress Notes (Addendum)
Transition of Care [facility]) - Emergency Department Mini Assessment   Patient Details  Name: Tony [name] MRN: [id number] Date of Birth: Jul 28, 1950  Transition of Care [facility]) CM/SW Contact:    Princella Ion, LCSW Phone Number: 01/22/2023, 11:10 AM   Clinical Narrative: Pt presented to ED for pain from fall. Pt was found to have fractures on 9/21 visit from fall at home. Per chart review, pt did not recall falling in his home. Per PT's evaluation, STR at SNF is recommended. CSW contacted pts daughter, [name], who reports they would like to pursue rehab in hopes of returning pt to his baseline. [name] reported they plan to transition pt into Independent Living once he is back to himself. CSW inquired about preferred facilities and [name] stated their preferred is [facility]. TOC to fax out. Bed offers pending.    ED Mini Assessment: What brought you to the Emergency Department? : pain from fall  Barriers to Discharge: ED SNF auth  Barrier interventions: SNF workup  Means of departure: Not know       Patient Contact and Communications Key Contact 1: Earnst, [place] (Daughter)  [phone]   Spoke with: [name] (daughter) Contact Date: 01/22/23,   Contact time: 1100 Contact Phone Number: [phone] Call outcome: Family is interested in SNF  Patient states their goals for this hospitalization and ongoing recovery are:: STR at [facility] CMS Medicare.gov Compare Post Acute Care list provided to:: Patient Represenative (must comment) Choice offered to / list presented to : Adult Children  Admission diagnosis:  Fall; Rib Pain Patient Active Problem List   Diagnosis Date Noted   OSA (obstructive sleep apnea) 05/31/2022   Cerebrovascular accident (CVA) (HCC) 05/31/2022   Mild cognitive impairment 05/31/2022   Gait abnormality 05/31/2022   Acute stroke due to ischemia (HCC) 03/22/2022   Aortic valve disorder 09/07/2021   Diabetic renal disease (HCC) 09/07/2021   Fatty liver 09/07/2021    Gastroesophageal reflux disease 09/07/2021   Heart block 09/07/2021   Hypercholesterolemia 09/07/2021   Mixed hyperlipidemia 09/07/2021   Hyperglycemia due to type 2 diabetes mellitus (HCC) 09/07/2021   Long term (current) use of insulin (HCC) 09/07/2021   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 associated with type 2 diabetes mellitus (HCC) 09/07/2021   Mitral regurgitation 09/07/2021   Moderate recurrent major depression (HCC) 09/07/2021   Obesity 09/07/2021   Severe recurrent major depression without psychotic features (HCC) 09/07/2021   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   Chronic systolic CHF (congestive heart failure) (HCC) 11/04/2019   Pacemaker 11/04/2019   Ganglion cyst 12/19/2015   Severe aortic stenosis 09/01/2014   Essential hypertension 09/01/2014   Type 2 diabetes mellitus with peripheral neuropathy (HCC) 09/01/2014   Hypothyroidism 09/01/2014   OSA on CPAP 09/01/2014   Lumbar stenosis 07/16/2011   HNP (herniated nucleus pulposus), lumbar 07/16/2011   PCP:  Emilio Aspen, MD Pharmacy:   [id number] [place], [place] - [address] AT [facility] OF [facility] & [address] [place] [address] Phone: [phone] Fax: [phone]  [facility] [address] [place] [address] Phone: [phone] Fax: [phone]  [id number] [place], [place] - [address] AT [facility] [address] [place] [address] Phone: [phone] Fax: [phone]

## 2023-01-22 NOTE — Progress Notes (Addendum)
Following for PT eval.   Addend @ 10:14 AM PT canceled eval due to pain. RNCM to assist with potential home health set up.

## 2023-01-23 ENCOUNTER — Encounter (HOSPITAL_COMMUNITY): Payer: Self-pay | Admitting: Internal Medicine

## 2023-01-23 ENCOUNTER — Inpatient Hospital Stay (HOSPITAL_COMMUNITY): Payer: Medicare PPO

## 2023-01-23 DIAGNOSIS — E538 Deficiency of other specified B group vitamins: Secondary | ICD-10-CM | POA: Insufficient documentation

## 2023-01-23 DIAGNOSIS — G3184 Mild cognitive impairment, so stated: Secondary | ICD-10-CM

## 2023-01-23 DIAGNOSIS — S2242XA Multiple fractures of ribs, left side, initial encounter for closed fracture: Secondary | ICD-10-CM | POA: Diagnosis not present

## 2023-01-23 DIAGNOSIS — F05 Delirium due to known physiological condition: Secondary | ICD-10-CM | POA: Diagnosis not present

## 2023-01-23 DIAGNOSIS — N400 Enlarged prostate without lower urinary tract symptoms: Secondary | ICD-10-CM | POA: Diagnosis present

## 2023-01-23 DIAGNOSIS — Z8673 Personal history of transient ischemic attack (TIA), and cerebral infarction without residual deficits: Secondary | ICD-10-CM

## 2023-01-23 DIAGNOSIS — R296 Repeated falls: Secondary | ICD-10-CM | POA: Diagnosis not present

## 2023-01-23 DIAGNOSIS — K5900 Constipation, unspecified: Secondary | ICD-10-CM

## 2023-01-23 DIAGNOSIS — R6510 Systemic inflammatory response syndrome (SIRS) of non-infectious origin without acute organ dysfunction: Secondary | ICD-10-CM | POA: Diagnosis not present

## 2023-01-23 LAB — URINALYSIS, COMPLETE (UACMP) WITH MICROSCOPIC
Bacteria, UA: NONE SEEN
Bilirubin Urine: NEGATIVE
Glucose, UA: >=500 mg/dL — AB
Ketones, ur: 5 mg/dL — AB
Leukocytes,Ua: NEGATIVE
Nitrite: NEGATIVE
Protein, ur: >=300 mg/dL — AB
Specific Gravity, Urine: 1.022 (ref 1.005–1.030)
pH: 6 (ref 5.0–8.0)

## 2023-01-23 LAB — COMPREHENSIVE METABOLIC PANEL
ALT: 12 U/L (ref 0–44)
AST: 20 U/L (ref 15–41)
Albumin: 3.7 g/dL (ref 3.5–5.0)
Alkaline Phosphatase: 53 U/L (ref 38–126)
Anion gap: 8 (ref 5–15)
BUN: 22 mg/dL (ref 8–23)
CO2: 25 mmol/L (ref 22–32)
Calcium: 8.6 mg/dL — ABNORMAL LOW (ref 8.9–10.3)
Chloride: 102 mmol/L (ref 98–111)
Creatinine, Ser: 1.35 mg/dL — ABNORMAL HIGH (ref 0.61–1.24)
GFR, Estimated: 55 mL/min — ABNORMAL LOW (ref >60)
Glucose, Bld: 267 mg/dL — ABNORMAL HIGH (ref 70–99)
Potassium: 3.5 mmol/L (ref 3.5–5.1)
Sodium: 135 mmol/L (ref 135–145)
Total Bilirubin: 3.1 mg/dL — ABNORMAL HIGH (ref 0.3–1.2)
Total Protein: 6.8 g/dL (ref 6.5–8.1)

## 2023-01-23 LAB — CBC WITH DIFFERENTIAL/PLATELET
Abs Immature Granulocytes: 0.08 10*3/uL — ABNORMAL HIGH (ref 0.00–0.07)
Basophils Absolute: 0 10*3/uL (ref 0.0–0.1)
Basophils Relative: 0 %
Eosinophils Absolute: 0 10*3/uL (ref 0.0–0.5)
Eosinophils Relative: 0 %
HCT: 40.8 % (ref 39.0–52.0)
Hemoglobin: 13.2 g/dL (ref 13.0–17.0)
Immature Granulocytes: 1 %
Lymphocytes Relative: 9 %
Lymphs Abs: 1.1 10*3/uL (ref 0.7–4.0)
MCH: 29.9 pg (ref 26.0–34.0)
MCHC: 32.4 g/dL (ref 30.0–36.0)
MCV: 92.3 fL (ref 80.0–100.0)
Monocytes Absolute: 1.2 10*3/uL — ABNORMAL HIGH (ref 0.1–1.0)
Monocytes Relative: 10 %
Neutro Abs: 9.6 10*3/uL — ABNORMAL HIGH (ref 1.7–7.7)
Neutrophils Relative %: 80 %
Platelets: 149 10*3/uL — ABNORMAL LOW (ref 150–400)
RBC: 4.42 MIL/uL (ref 4.22–5.81)
RDW: 13.8 % (ref 11.5–15.5)
WBC: 12 10*3/uL — ABNORMAL HIGH (ref 4.0–10.5)
nRBC: 0 % (ref 0.0–0.2)

## 2023-01-23 LAB — HEMOGLOBIN A1C
Hgb A1c MFr Bld: 8.4 % — ABNORMAL HIGH (ref 4.8–5.6)
Mean Plasma Glucose: 194.38 mg/dL

## 2023-01-23 LAB — GLUCOSE, CAPILLARY
Glucose-Capillary: 225 mg/dL — ABNORMAL HIGH (ref 70–99)
Glucose-Capillary: 233 mg/dL — ABNORMAL HIGH (ref 70–99)

## 2023-01-23 LAB — BLOOD GAS, VENOUS
Acid-Base Excess: 0.8 mmol/L (ref 0.0–2.0)
Bicarbonate: 25.3 mmol/L (ref 20.0–28.0)
O2 Saturation: 93.8 %
Patient temperature: 37
pCO2, Ven: 39 mmHg — ABNORMAL LOW (ref 44–60)
pH, Ven: 7.42 (ref 7.25–7.43)
pO2, Ven: 62 mmHg — ABNORMAL HIGH (ref 32–45)

## 2023-01-23 LAB — CBG MONITORING, ED
Glucose-Capillary: 254 mg/dL — ABNORMAL HIGH (ref 70–99)
Glucose-Capillary: 221 mg/dL — ABNORMAL HIGH (ref 70–99)

## 2023-01-23 LAB — PROCALCITONIN: Procalcitonin: <0.1 ng/mL

## 2023-01-23 LAB — TSH: TSH: 45.135 u[IU]/mL — ABNORMAL HIGH (ref 0.350–4.500)

## 2023-01-23 LAB — MAGNESIUM: Magnesium: 1.7 mg/dL (ref 1.7–2.4)

## 2023-01-23 LAB — VITAMIN B12: Vitamin B-12: 161 pg/mL — ABNORMAL LOW (ref 180–914)

## 2023-01-23 LAB — PHOSPHORUS: Phosphorus: 2.7 mg/dL (ref 2.5–4.6)

## 2023-01-23 LAB — BRAIN NATRIURETIC PEPTIDE: B Natriuretic Peptide: 644.3 pg/mL — ABNORMAL HIGH (ref 0.0–100.0)

## 2023-01-23 LAB — T4, FREE: Free T4: 0.5 ng/dL — ABNORMAL LOW (ref 0.61–1.12)

## 2023-01-23 MED ORDER — ACETAMINOPHEN 500 MG PO TABS
1000.0000 mg | ORAL_TABLET | Freq: Four times a day (QID) | ORAL | Status: DC
  Administered 2023-01-23 – 2023-01-24 (×5): 1000 mg via ORAL
  Filled 2023-01-23 (×5): qty 2

## 2023-01-23 MED ORDER — GABAPENTIN 100 MG PO CAPS
100.0000 mg | ORAL_CAPSULE | Freq: Every day | ORAL | Status: DC
  Administered 2023-01-23: 100 mg via ORAL
  Filled 2023-01-23: qty 1

## 2023-01-23 MED ORDER — STERILE WATER FOR INJECTION IJ SOLN
INTRAMUSCULAR | Status: AC
  Filled 2023-01-23: qty 10

## 2023-01-23 MED ORDER — CARVEDILOL 3.125 MG PO TABS
3.1250 mg | ORAL_TABLET | Freq: Two times a day (BID) | ORAL | Status: DC
  Administered 2023-01-23 – 2023-01-24 (×2): 3.125 mg via ORAL
  Filled 2023-01-23 (×2): qty 1

## 2023-01-23 MED ORDER — INSULIN GLARGINE-YFGN 100 UNIT/ML ~~LOC~~ SOLN
12.0000 [IU] | Freq: Two times a day (BID) | SUBCUTANEOUS | Status: DC
  Administered 2023-01-23 – 2023-01-24 (×3): 12 [IU] via SUBCUTANEOUS
  Filled 2023-01-23 (×5): qty 0.12

## 2023-01-23 MED ORDER — CYANOCOBALAMIN 1000 MCG/ML IJ SOLN
1000.0000 ug | Freq: Every day | INTRAMUSCULAR | Status: DC
  Administered 2023-01-23 – 2023-01-24 (×2): 1000 ug via SUBCUTANEOUS
  Filled 2023-01-23 (×2): qty 1

## 2023-01-23 MED ORDER — KETOROLAC TROMETHAMINE 15 MG/ML IJ SOLN
15.0000 mg | Freq: Four times a day (QID) | INTRAMUSCULAR | Status: AC
  Administered 2023-01-23 – 2023-01-24 (×4): 15 mg via INTRAVENOUS
  Filled 2023-01-23 (×4): qty 1

## 2023-01-23 MED ORDER — HALOPERIDOL LACTATE 5 MG/ML IJ SOLN: 5.0000 mg | Freq: Four times a day (QID) | INTRAMUSCULAR | Status: DC | PRN

## 2023-01-23 MED ORDER — METHOCARBAMOL 1000 MG/10ML IJ SOLN
500.0000 mg | Freq: Four times a day (QID) | INTRAVENOUS | Status: DC | PRN
  Filled 2023-01-23: qty 5

## 2023-01-23 MED ORDER — NALOXONE HCL 0.4 MG/ML IJ SOLN: 0.4000 mg | INTRAMUSCULAR | Status: DC | PRN

## 2023-01-23 MED ORDER — ZIPRASIDONE MESYLATE 20 MG IM SOLR
20.0000 mg | Freq: Once | INTRAMUSCULAR | Status: DC
  Filled 2023-01-23: qty 20

## 2023-01-23 MED ORDER — BISACODYL 5 MG PO TBEC
10.0000 mg | DELAYED_RELEASE_TABLET | Freq: Once | ORAL | Status: AC
  Administered 2023-01-23: 10 mg via ORAL
  Filled 2023-01-23: qty 2

## 2023-01-23 MED ORDER — METHOCARBAMOL 500 MG PO TABS: 500.0000 mg | ORAL_TABLET | Freq: Four times a day (QID) | ORAL | Status: DC | PRN

## 2023-01-23 MED ORDER — FENTANYL CITRATE PF 50 MCG/ML IJ SOSY
50.0000 ug | PREFILLED_SYRINGE | INTRAMUSCULAR | Status: DC | PRN
  Administered 2023-01-23 (×3): 50 ug via INTRAVENOUS
  Filled 2023-01-23 (×4): qty 1

## 2023-01-23 MED ORDER — LEVOTHYROXINE SODIUM 50 MCG PO TABS: 175.0000 ug | ORAL_TABLET | Freq: Every day | ORAL | Status: DC

## 2023-01-23 MED ORDER — HALOPERIDOL LACTATE 5 MG/ML IJ SOLN: 2.0000 mg | INTRAMUSCULAR | Status: DC | PRN

## 2023-01-23 MED ORDER — POLYETHYLENE GLYCOL 3350 17 G PO PACK
34.0000 g | PACK | Freq: Every day | ORAL | Status: DC
  Administered 2023-01-24: 34 g via ORAL
  Filled 2023-01-23 (×2): qty 2

## 2023-01-23 MED ORDER — MELATONIN 5 MG PO TABS
10.0000 mg | ORAL_TABLET | Freq: Every day | ORAL | Status: DC
  Administered 2023-01-23: 10 mg via ORAL
  Filled 2023-01-23: qty 2

## 2023-01-23 MED ORDER — LABETALOL HCL 5 MG/ML IV SOLN: 10.0000 mg | INTRAVENOUS | Status: DC | PRN

## 2023-01-23 MED ORDER — FENTANYL CITRATE PF 50 MCG/ML IJ SOSY
25.0000 ug | PREFILLED_SYRINGE | Freq: Once | INTRAMUSCULAR | Status: AC
  Administered 2023-01-23: 25 ug via INTRAMUSCULAR

## 2023-01-23 NOTE — Assessment & Plan Note (Signed)
Likely due to multiple reasons including cognitive impairment/dementia, acute pain. Dtr would like to avoid opiates if at all possible. Pt only required 2 doses of Prn IV haldol during hospitalization  At discharge, no haldol will be prescribed. Now resolved.

## 2023-01-23 NOTE — ED Notes (Signed)
ED TO INPATIENT HANDOFF REPORT  Name/Age/Gender Tony [name] 72 y.o. male  Code Status    Code Status Orders  (From admission, onward)           Start     Ordered   01/22/23 2243  Full code  Continuous       Question:  By:  Answer:  Consent: discussion documented in EHR   01/22/23 2243           Code Status History     Date Active Date Inactive Code Status Order ID Comments User Context   03/22/2022 2124 03/23/2022 2048 Full Code [id number]  Inez Catalina, MD ED   08/29/2021 1351 08/30/2021 1823 Full Code [id number]  Allena Katz Inpatient   08/07/2021 0931 08/07/2021 1736 Full Code [id number]  Tonny Bollman, MD Inpatient   08/11/2014 1150 08/12/2014 1646 Full Code [id number]  Hillis Range, MD Inpatient       Home/SNF/Other Skilled nursing facility  Chief Complaint Multiple rib fractures [S22.49XA]  Level of Care/Admitting Diagnosis ED Disposition     ED Disposition  Admit   Condition  --   Comment  Hospital Area: [facility] [[id number]]  Level of Care: Progressive [102]  Admit to Progressive based on following criteria: MULTISYSTEM THREATS such as stable sepsis, metabolic/electrolyte imbalance with or without encephalopathy that is responding to early treatment.  May admit patient to [facility] or [facility] if equivalent level of care is available:: No  Covid Evaluation: Asymptomatic - no recent exposure (last 10 days) testing not required  Diagnosis: Multiple rib fractures [[id number]]  Admitting Physician: Angie Fava [[id number]]  Attending Physician: Angie Fava [[id number]]  Certification:: I certify this patient will need inpatient services for at least 2 midnights          Medical History Past Medical History:  Diagnosis Date   Cancer [facility])    thyroid   CHF (congestive heart failure) (HCC)    Complete heart block (HCC)    a. 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 Excell Seltzer and Dr. Laneta Simmers   Severe aortic stenosis    Sleep apnea     Allergies Allergies  Allergen Reactions   Codeine Itching   Crestor [Rosuvastatin Calcium] Other (See Comments)    Leg pain   Metformin Hcl Diarrhea   Simvastatin Other (See Comments)    Leg pain    IV Location/Drains/Wounds Patient Lines/Drains/Airways Status     Active Line/Drains/Airways     Name Placement date Placement time Site Days   Peripheral IV 01/23/23 22 G Left;Posterior Hand 01/23/23  0352  Hand  less than 1            Labs/Imaging Results for orders placed or performed during the hospital encounter of 01/21/23 (from the past 48 hour(s))  CBC with Differential     Status: Abnormal   Collection Time: 01/21/23  9:46 PM  Result Value Ref Range   WBC 12.5 (H) 4.0 - 10.5 K/uL   RBC 5.01 4.22 -  5.81 MIL/uL   Hemoglobin 14.7 13.0 - 17.0 g/dL   HCT 16.1 09.6 - 04.5 %   MCV 87.6 80.0 - 100.0 fL   MCH 29.3 26.0 - 34.0 pg   MCHC 33.5 30.0 - 36.0 g/dL   RDW 40.9 81.1 - 91.4 %   Platelets 173 150 - 400 K/uL   nRBC 0.0 0.0 - 0.2 %   Neutrophils Relative % 79 %   Neutro Abs 9.9 (H) 1.7 - 7.7 K/uL   Lymphocytes Relative 11 %   Lymphs Abs 1.4 0.7 - 4.0 K/uL   Monocytes Relative 7 %   Monocytes Absolute 0.9 0.1 - 1.0 K/uL   Eosinophils Relative 1 %   Eosinophils Absolute 0.1 0.0 - 0.5 K/uL   Basophils Relative 1 %   Basophils Absolute 0.1 0.0 - 0.1 K/uL   Immature Granulocytes 1 %   Abs Immature Granulocytes 0.10 (H) 0.00 - 0.07 K/uL    Comment: Performed at [facility], [address] [place], [place] [address]  Comprehensive  metabolic panel     Status: Abnormal   Collection Time: 01/21/23  9:46 PM  Result Value Ref Range   Sodium 134 (L) 135 - 145 mmol/L   Potassium 3.8 3.5 - 5.1 mmol/L   Chloride 101 98 - 111 mmol/L   CO2 22 22 - 32 mmol/L   Glucose, Bld 260 (H) 70 - 99 mg/dL    Comment: Glucose reference range applies only to samples taken after fasting for at least 8 hours.   BUN 19 8 - 23 mg/dL   Creatinine, Ser 6.21 0.61 - 1.24 mg/dL   Calcium 8.7 (L) 8.9 - 10.3 mg/dL   Total Protein 7.7 6.5 - 8.1 g/dL   Albumin 4.1 3.5 - 5.0 g/dL   AST 15 15 - 41 U/L   ALT 13 0 - 44 U/L   Alkaline Phosphatase 56 38 - 126 U/L   Total Bilirubin 3.0 (H) 0.3 - 1.2 mg/dL   GFR, Estimated >30 >86 mL/min    Comment: (NOTE) Calculated using the CKD-EPI Creatinine Equation (2021)    Anion gap 11 5 - 15    Comment: Performed at [facility], [address], [place], [place] [address]  CBG monitoring, ED     Status: Abnormal   Collection Time: 01/22/23 12:13 PM  Result Value Ref Range   Glucose-Capillary 236 (H) 70 - 99 mg/dL    Comment: Glucose reference range applies only to samples taken after fasting for at least 8 hours.  CBG monitoring, ED     Status: Abnormal   Collection Time: 01/22/23  6:34 PM  Result Value Ref Range   Glucose-Capillary 250 (H) 70 - 99 mg/dL    Comment: Glucose reference range applies only to samples taken after fasting for at least 8 hours.  Procalcitonin     Status: None   Collection Time: 01/23/23  4:14 AM  Result Value Ref Range   Procalcitonin <0.10 ng/mL    Comment:        Interpretation: PCT (Procalcitonin) <= 0.5 ng/mL: Systemic infection (sepsis) is not likely. Local bacterial infection is possible. (NOTE)       Sepsis PCT Algorithm           Lower Respiratory Tract  Infection PCT Algorithm    ----------------------------     ----------------------------         PCT < 0.25 ng/mL                PCT < 0.10 ng/mL           Strongly encourage             Strongly discourage   discontinuation of antibiotics    initiation of antibiotics    ----------------------------     -----------------------------       PCT 0.25 - 0.50 ng/mL            PCT 0.10 - 0.25 ng/mL               OR       >80% decrease in PCT            Discourage initiation of                                            antibiotics      Encourage discontinuation           of antibiotics    ----------------------------     -----------------------------         PCT >= 0.50 ng/mL              PCT 0.26 - 0.50 ng/mL               AND        <80% decrease in PCT             Encourage initiation of                                             antibiotics       Encourage continuation           of antibiotics    ----------------------------     -----------------------------        PCT >= 0.50 ng/mL                  PCT > 0.50 ng/mL               AND         increase in PCT                  Strongly encourage                                      initiation of antibiotics    Strongly encourage escalation           of antibiotics                                     -----------------------------                                           PCT <= 0.25 ng/mL  OR                                        > 80% decrease in PCT                                      Discontinue / Do not initiate                                             antibiotics  Performed at [facility], [address], [place], [place] [address]   CBC with Differential/Platelet     Status: Abnormal   Collection Time: 01/23/23  4:14 AM  Result Value Ref Range   WBC 12.0 (H) 4.0 - 10.5 K/uL   RBC 4.42 4.22 - 5.81 MIL/uL   Hemoglobin 13.2 13.0 - 17.0 g/dL   HCT 10.2 72.5 - 36.6 %   MCV 92.3 80.0 - 100.0 fL   MCH 29.9 26.0 - 34.0 pg   MCHC 32.4 30.0 - 36.0 g/dL   RDW 44.0 34.7 - 42.5 %   Platelets 149 (L) 150 - 400 K/uL    nRBC 0.0 0.0 - 0.2 %   Neutrophils Relative % 80 %   Neutro Abs 9.6 (H) 1.7 - 7.7 K/uL   Lymphocytes Relative 9 %   Lymphs Abs 1.1 0.7 - 4.0 K/uL   Monocytes Relative 10 %   Monocytes Absolute 1.2 (H) 0.1 - 1.0 K/uL   Eosinophils Relative 0 %   Eosinophils Absolute 0.0 0.0 - 0.5 K/uL   Basophils Relative 0 %   Basophils Absolute 0.0 0.0 - 0.1 K/uL   Immature Granulocytes 1 %   Abs Immature Granulocytes 0.08 (H) 0.00 - 0.07 K/uL    Comment: Performed at [facility], [address], [place], [place] [address]  Comprehensive metabolic panel     Status: Abnormal   Collection Time: 01/23/23  4:14 AM  Result Value Ref Range   Sodium 135 135 - 145 mmol/L   Potassium 3.5 3.5 - 5.1 mmol/L   Chloride 102 98 - 111 mmol/L   CO2 25 22 - 32 mmol/L   Glucose, Bld 267 (H) 70 - 99 mg/dL    Comment: Glucose reference range applies only to samples taken after fasting for at least 8 hours.   BUN 22 8 - 23 mg/dL   Creatinine, Ser 7.56 (H) 0.61 - 1.24 mg/dL   Calcium 8.6 (L) 8.9 - 10.3 mg/dL   Total Protein 6.8 6.5 - 8.1 g/dL   Albumin 3.7 3.5 - 5.0 g/dL   AST 20 15 - 41 U/L   ALT 12 0 - 44 U/L   Alkaline Phosphatase 53 38 - 126 U/L   Total Bilirubin 3.1 (H) 0.3 - 1.2 mg/dL   GFR, Estimated 55 (L) >60 mL/min    Comment: (NOTE) Calculated using the CKD-EPI Creatinine Equation (2021)    Anion gap 8 5 - 15    Comment: Performed at [facility], [address] [place], [place] [address]  Magnesium     Status: None   Collection Time: 01/23/23  4:14 AM  Result Value Ref Range   Magnesium 1.7 1.7 -  2.4 mg/dL    Comment: Performed at [facility], [address] [place], [place] [address]  Phosphorus     Status: None   Collection Time: 01/23/23  4:14 AM  Result Value Ref Range   Phosphorus 2.7 2.5 - 4.6 mg/dL    Comment: Performed at [facility], [address], [place], [place] [address]  Vitamin B12     Status:  Abnormal   Collection Time: 01/23/23  4:14 AM  Result Value Ref Range   Vitamin B-12 161 (L) 180 - 914 pg/mL    Comment: (NOTE) This assay is not validated for testing neonatal or myeloproliferative syndrome specimens for Vitamin B12 levels. Performed at [facility], [address], [place], [place] [address]   TSH     Status: Abnormal   Collection Time: 01/23/23  4:14 AM  Result Value Ref Range   TSH 45.135 (H) 0.350 - 4.500 uIU/mL    Comment: Performed by a 3rd Generation assay with a functional sensitivity of <=0.01 uIU/mL. Performed at [facility], [address], [place], [place] [address]   Blood gas, venous     Status: Abnormal   Collection Time: 01/23/23  4:14 AM  Result Value Ref Range   pH, Ven 7.42 7.25 - 7.43   pCO2, Ven 39 (L) 44 - 60 mmHg   pO2, Ven 62 (H) 32 - 45 mmHg   Bicarbonate 25.3 20.0 - 28.0 mmol/L   Acid-Base Excess 0.8 0.0 - 2.0 mmol/L   O2 Saturation 93.8 %   Patient temperature 37.0     Comment: Performed at [facility], [address] [place], [place] [address]  Hemoglobin A1c     Status: Abnormal   Collection Time: 01/23/23  4:14 AM  Result Value Ref Range   Hgb A1c MFr Bld 8.4 (H) 4.8 - 5.6 %    Comment: (NOTE) Pre diabetes:          5.7%-6.4%  Diabetes:              >6.4%  Glycemic control for   <7.0% adults with diabetes    Mean Plasma Glucose 194.38 mg/dL    Comment: Performed at [facility], [address], [place], [place] [address]  Brain natriuretic peptide     Status: Abnormal   Collection Time: 01/23/23  4:14 AM  Result Value Ref Range   B Natriuretic Peptide 644.3 (H) 0.0 - 100.0 pg/mL    Comment: Performed at [facility], [address], [place], [place] [address]  CBG monitoring, ED     Status: Abnormal   Collection Time: 01/23/23  4:24 AM  Result Value Ref Range   Glucose-Capillary 254 (H) 70 - 99 mg/dL    Comment: Glucose reference range  applies only to samples taken after fasting for at least 8 hours.  CBG monitoring, ED     Status: Abnormal   Collection Time: 01/23/23 11:10 AM  Result Value Ref Range   Glucose-Capillary 221 (H) 70 - 99 mg/dL    Comment: Glucose reference range applies only to samples taken after fasting for at least 8 hours.  Urinalysis, Complete w Microscopic -Urine, Clean Catch     Status: Abnormal   Collection Time: 01/23/23  3:23 PM  Result Value Ref Range   Color, Urine YELLOW YELLOW   APPearance CLEAR CLEAR   Specific Gravity, Urine 1.022 1.005 - 1.030   pH 6.0 5.0 - 8.0  Glucose, UA >=500 (A) NEGATIVE mg/dL   Hgb urine dipstick MODERATE (A) NEGATIVE   Bilirubin Urine NEGATIVE NEGATIVE   Ketones, ur 5 (A) NEGATIVE mg/dL   Protein, ur >=161 (A) NEGATIVE mg/dL   Nitrite NEGATIVE NEGATIVE   Leukocytes,Ua NEGATIVE NEGATIVE   RBC / HPF 0-5 0 - 5 RBC/hpf   WBC, UA 0-5 0 - 5 WBC/hpf   Bacteria, UA NONE SEEN NONE SEEN   Squamous Epithelial / HPF 0-5 0 - 5 /HPF   Mucus PRESENT     Comment: Performed at [facility], [address] [place], [place] [address]   DG Chest Portable 1 View  Result Date: 01/23/2023 CLINICAL DATA:  Painful spasms of chest EXAM: PORTABLE CHEST 1 VIEW COMPARISON:  Chest radiograph 2 days prior and chest CT 1 day prior FINDINGS: The left chest wall cardiac device and associated leads and aortic valve prosthesis are stable. The cardiomediastinal silhouette is stable. Aeration of the lungs is unchanged, with no new or worsening focal airspace disease. There is no pulmonary edema. There is no pleural effusion or pneumothorax. Known left rib fractures are again seen with worsened displacement since the study from 2 days prior. IMPRESSION: 1. Stable aeration of the lungs with no new or worsening airspace disease. 2. Known left rib fractures again seen with worsened displacement compared to the study from 2 days prior. No pneumothorax. Electronically Signed   By:  Lesia Hausen M.D.   On: 01/23/2023 08:51   CT Chest Wo Contrast  Result Date: 01/22/2023 CLINICAL DATA:  Chest trauma, blunt. EXAM: CT CHEST WITHOUT CONTRAST TECHNIQUE: Multidetector CT imaging of the chest was performed following the standard protocol without IV contrast. RADIATION DOSE REDUCTION: This exam was performed according to the departmental dose-optimization program which includes automated exposure control, adjustment of the mA and/or kV according to patient size and/or use of iterative reconstruction technique. COMPARISON:  01/19/2023. FINDINGS: Cardiovascular: Heart is enlarged and there is a trace pericardial effusion. Pacemaker leads are present in the heart. A TAVR stent is noted. Multi-vessel coronary artery calcifications are noted. 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 The pulmonary trunk is normal in caliber. Mediastinum/Nodes: No mediastinal or axillary lymphadenopathy. Evaluation of the hila is limited due to lack of IV contrast. The trachea and esophagus are within normal limits. There is a small hiatal hernia. Lungs/Pleura: There are trace bilateral pleural effusions. Atelectasis is noted bilaterally. There is a stable 4 mm nodule in the left upper lobe, axial image 56. No pneumothorax. Upper Abdomen: No acute abnormality. Musculoskeletal: A pacemaker device is noted in the anterior chest wall on the left. Degenerative changes are present in the thoracic spine. There are fractures of the T4, T5, T6, T7, and T8 ribs on the left. IMPRESSION: 1. Fractures of the T4 through T8 ribs on the left. 2. Trace bilateral pleural effusions with atelectasis. 3. Stable 4 mm left upper lobe pulmonary nodule. No follow-up needed if patient is low-risk.This recommendation follows the consensus statement: Guidelines for Management of Incidental Pulmonary Nodules Detected on CT Images: From the Fleischner Society 2017; Radiology  2017; 284:228-243. 4. Aortic atherosclerosis and coronary artery calcifications. Electronically Signed   By: Thornell Sartorius M.D.   On: 01/22/2023 04:40   CT Head Wo Contrast  Result Date: 01/22/2023 CLINICAL DATA:  Minor head trauma EXAM: CT HEAD WITHOUT CONTRAST TECHNIQUE: Contiguous axial images were obtained from the base of the skull through  the vertex without intravenous contrast. RADIATION DOSE REDUCTION: This exam was performed according to the departmental dose-optimization program which includes automated exposure control, adjustment of the mA and/or kV according to patient size and/or use of iterative reconstruction technique. COMPARISON:  09/16/2022 FINDINGS: Brain: No evidence of acute infarction, hemorrhage, hydrocephalus, extra-axial collection or mass lesion/mass effect. Generalized atrophy with ventriculomegaly. Mild chronic small vessel ischemia in the cerebral white matter. Vascular: No hyperdense vessel or unexpected calcification. Skull: Normal. Negative for fracture or focal lesion. Sinuses/Orbits: No acute finding. IMPRESSION: No acute or reversible finding. Electronically Signed   By: Tiburcio Pea M.D.   On: 01/22/2023 04:00   DG Ribs Unilateral W/Chest Left  Result Date: 01/21/2023 CLINICAL DATA:  Fall, known fracture, worsening pain. Shortness of breath, difficulty moving. EXAM: LEFT RIBS AND CHEST - 3+ VIEW COMPARISON:  01/19/2023. FINDINGS: There is partial visualization of a T5 rib fracture on the left. The T4 rib fracture is not seen due to overlying pacemaker. There are new rib fractures at T7 and T8 on the left. There is no evidence of pneumothorax or pleural effusion. Atelectasis is noted at the lung bases bilaterally. Heart size and mediastinal contours are stable. A TAVR stent is noted. A dual lead pacemaker is present over the left chest. IMPRESSION: 1. New rib fractures at T7 and T8 on the left. 2. Known left T5 rib fracture is seen. The previously described T4 rib  fracture is not seen due to overlying pacemaker. 3. Atelectasis at the lung bases. Electronically Signed   By: Thornell Sartorius M.D.   On: 01/21/2023 23:27    Pending Labs Unresulted Labs (From admission, onward)     Start     Ordered   01/24/23 0500  Comprehensive metabolic panel  Tomorrow morning,   R        01/23/23 1201   01/23/23 1241  T4, free  Add-on,   AD        01/23/23 1240            Vitals/Pain Today's Vitals   01/23/23 1054 01/23/23 1132 01/23/23 1225 01/23/23 1521  BP: (!) 210/175 (!) 183/105  (!) 176/105  Pulse: (!) 102 89  88  Resp: 18   20  Temp: 99 F (37.2 C)   98.7 F (37.1 C)  TempSrc: Oral   Oral  SpO2: 96%   96%  Weight:      Height:      PainSc:   Asleep     Isolation Precautions No active isolations  Medications Medications  aspirin EC tablet 81 mg (81 mg Oral Given 01/23/23 1106)  atorvastatin (LIPITOR) tablet 40 mg (40 mg Oral Given 01/23/23 1103)  famotidine (PEPCID) tablet 20 mg (20 mg Oral Given 01/23/23 1107)  Finerenone TABS 10 mg (10 mg Oral Given 01/23/23 1107)  memantine (NAMENDA) tablet 10 mg (10 mg Oral Given 01/23/23 1106)  sacubitril-valsartan (ENTRESTO) 24-26 mg per tablet (1 tablet Oral Given 01/23/23 1140)  sertraline (ZOLOFT) tablet 200 mg (200 mg Oral Given 01/23/23 1106)  tamsulosin (FLOMAX) capsule 0.4 mg (0.4 mg Oral Given 01/23/23 1108)  insulin aspart (novoLOG) injection 0-15 Units ( Subcutaneous Not Given 01/23/23 1208)  insulin aspart (novoLOG) injection 0-5 Units (3 Units Subcutaneous Given 01/23/23 0502)  lidocaine (LIDODERM) 5 % 1 patch (1 patch Transdermal Patch Removed 01/23/23 1107)  ondansetron (ZOFRAN) injection 4 mg (has no administration in time range)  naloxone (NARCAN) injection 0.4 mg (has no administration in time range)  fentaNYL (SUBLIMAZE)  injection 50 mcg (50 mcg Intravenous Given 01/23/23 1104)  sterile water (preservative free) injection (  Not Given 01/23/23 0436)  insulin glargine-yfgn (SEMGLEE) injection 12  Units (12 Units Subcutaneous Given 01/23/23 1139)  polyethylene glycol (MIRALAX / GLYCOLAX) packet 34 g (34 g Oral Patient Refused/Not Given 01/23/23 1300)  gabapentin (NEURONTIN) capsule 100 mg (has no administration in time range)  ketorolac (TORADOL) 15 MG/ML injection 15 mg (15 mg Intravenous Given 01/23/23 1217)  methocarbamol (ROBAXIN) tablet 500 mg (has no administration in time range)    Or  methocarbamol (ROBAXIN) 500 mg in dextrose 5 % 50 mL IVPB (has no administration in time range)  acetaminophen (TYLENOL) tablet 1,000 mg (1,000 mg Oral Given 01/23/23 1217)  melatonin tablet 10 mg (has no administration in time range)  haloperidol lactate (HALDOL) injection 2 mg (has no administration in time range)  carvedilol (COREG) tablet 3.125 mg (3.125 mg Oral Given 01/23/23 1625)  labetalol (NORMODYNE) injection 10 mg (has no administration in time range)  levothyroxine (SYNTHROID) tablet 175 mcg (has no administration in time range)  cyanocobalamin (VITAMIN B12) injection 1,000 mcg (1,000 mcg Subcutaneous Given 01/23/23 1625)  oxyCODONE-acetaminophen (PERCOCET/ROXICET) 5-325 MG per tablet 1 tablet (1 tablet Oral Given 01/22/23 0849)  haloperidol lactate (HALDOL) injection 2 mg (2 mg Intramuscular Given 01/22/23 2127)  haloperidol lactate (HALDOL) injection 2 mg (2 mg Intramuscular Given 01/23/23 0048)  fentaNYL (SUBLIMAZE) injection 25 mcg (25 mcg Intramuscular Given 01/23/23 0124)  bisacodyl (DULCOLAX) EC tablet 10 mg (10 mg Oral Given 01/23/23 1217)    Mobility walks with device

## 2023-01-23 NOTE — Assessment & Plan Note (Signed)
On plavix at home. Will hold plavix while taking toradol

## 2023-01-23 NOTE — ED Notes (Signed)
Pt currently resting comfortably after pain medication administration.  Will defer morning meds for when pt is more alert.

## 2023-01-23 NOTE — Assessment & Plan Note (Signed)
Has had multiple falls in last week resulting in multiple rib fractures. Will need Snf at discharge.

## 2023-01-23 NOTE — Assessment & Plan Note (Addendum)
B12 level low at 161. Will start SQ 1000 mcg daily.

## 2023-01-23 NOTE — ED Notes (Signed)
Pt back from CT, resting comfortably.

## 2023-01-23 NOTE — Subjective & Objective (Signed)
Pt seen and examined. Pt is calm and without any restraints. Spoke with pt bedside ED RN. She has not noted pt to be in any restraints since 7 am since she started her shift. She does not know if pt had any restraints on prior to 7 AM.  Spoke with pt's dtr(Dr. Valentino Hue). She was present last night in ER. She states pt was floridly delirious last night. She is requesting minimal use of opiates for him. She is agreeable to IV/IM haldol if needed. She is aware of cardiovascular risks. Pt does have PPM. She is requesting scheduled IV Toradol and scheduled po tylenol for pain control. She is aware of pt's Scr of 1.35 today.  Dtr states that pt has had probable dementia for sometime now. It was not discovered until his stroke around Thanksgiving in 2023.  Dtr states pt does have sundowning.  She is agreeable to DC patient to SNF.

## 2023-01-23 NOTE — Hospital Course (Signed)
HPI: Tony [name] is a 72 y.o. male with medical history significant for type 2 diabetes mellitus, chronic combined systolic/diastolic heart failure, complete heart block status post pacemaker, severe aortic stenosis status post TAVR in May 2023, acquired hypothyroidism, who is admitted to [facility] on 01/21/2023 with multiple consecutive left-sided rib fractures after presenting from home to [facility] ED complaining of falls.    In the setting of the patient's altered mental status, the following history is provided via my discussions with the EDP and via chart review.   At baseline, the patient lives at home, and is able to perform his own ADLs.  2 days ago, he presented to the emergency department with a ground-level mechanical fall at which time he was found on chest x-ray to have T7 and T8 left-sided rib fractures without displacement.  He was subsequent discharged back to home from the emergency department.  However, he presented back to the ED yesterday after interval 3 additional ground-level mechanical falls with ensuing imaging showing interval additional rib fractures, namely left-sided rib fractures involving T4-T8.  At that time, the decision was made to board the patient in the ED while seeking placement for the patient.  In the interval, he has become progressively more confused relative to his baseline mental status.  Significant Events: Admitted 01/21/2023   Significant Labs:   Significant Imaging Studies: CT chest 1. Fractures of the T4 through T8 ribs on the left. 2. Trace bilateral pleural effusions with atelectasis. 3. Stable 4 mm left upper lobe pulmonary nodule. No follow-up needed if patient is low-risk  Antibiotic Therapy: Anti-infectives (From admission, onward)    None       Procedures:   Consultants:

## 2023-01-23 NOTE — Assessment & Plan Note (Addendum)
On synthroid 175 mcg daily. TSH elevated at 45.135. will check FT4.  Changed synthroid to at bedtime dosing  Dtr states pt has had total thyroidectomy. Will need repeat TSH, FT4 in PCP office in 6 weeks prior to any dosage changes. Dtr thinks pt has not been taking synthroid on a regular basis.

## 2023-01-23 NOTE — Assessment & Plan Note (Signed)
On flomax ?

## 2023-01-23 NOTE — ED Notes (Signed)
VS deferred until patient wakes.

## 2023-01-23 NOTE — Progress Notes (Signed)
PROGRESS NOTE    Tony [name]  ZDG:[id number] DOB: [birth year] DOA: 01/21/2023 PCP: Emilio Aspen, MD  Subjective: Pt seen and examined. Pt is calm and without any restraints. Spoke with pt bedside ED RN. She has not noted pt to be in any restraints since 7 am since she started her shift. She does not know if pt had any restraints on prior to 7 AM.  Spoke with pt's dtr(Dr. Valentino Hue). She was present last night in ER. She states pt was floridly delirious last night. She is requesting minimal use of opiates for him. She is agreeable to IV/IM haldol if needed. She is aware of cardiovascular risks. Pt does have PPM. She is requesting scheduled IV Toradol and scheduled po tylenol for pain control. She is aware of pt's Scr of 1.35 today.  Dtr states that pt has had probable dementia for sometime now. It was not discovered until his stroke around Thanksgiving in 2023.  Dtr states pt does have sundowning.  She is agreeable to DC patient to SNF.    Hospital Course: HPI: Tony [name] is a 72 y.o. male with medical history significant for type 2 diabetes mellitus, chronic combined systolic/diastolic heart failure, complete heart block status post pacemaker, severe aortic stenosis status post TAVR in May 2023, acquired hypothyroidism, who is admitted to [facility] on 01/21/2023 with multiple consecutive left-sided rib fractures after presenting from home to [facility] ED complaining of falls.    In the setting of the patient's altered mental status, the following history is provided via my discussions with the EDP and via chart review.   At baseline, the patient lives at home, and is able to perform his own ADLs.  2 days ago, he presented to the emergency department with a ground-level mechanical fall at which time he was found on chest x-ray to have T7 and T8 left-sided rib fractures without displacement.  He was subsequent discharged back to home from the emergency department.  However,  he presented back to the ED yesterday after interval 3 additional ground-level mechanical falls with ensuing imaging showing interval additional rib fractures, namely left-sided rib fractures involving T4-T8.  At that time, the decision was made to board the patient in the ED while seeking placement for the patient.  In the interval, he has become progressively more confused relative to his baseline mental status.  Significant Events: Admitted 01/21/2023   Significant Labs:   Significant Imaging Studies: CT chest 1. Fractures of the T4 through T8 ribs on the left. 2. Trace bilateral pleural effusions with atelectasis. 3. Stable 4 mm left upper lobe pulmonary nodule. No follow-up needed if patient is low-risk  Antibiotic Therapy: Anti-infectives (From admission, onward)    None       Procedures:   Consultants:     Assessment and Plan: * Multiple rib fractures Continue with scheduled tylenol 1000 mg q6h scheduled. Scheduled toradol 15 mg q6h for next 24 hours(per dtr request). Will monitor CMP. Hold plavix.  SIRS (systemic inflammatory response syndrome) (HCC) No evidence of infection. Procal is negative. Pt does not have sepsis. SIRS due to fall and pain associated with rib fractures. Will not start abx. Dtr is agreeable.  Frequent falls Has had multiple falls in last week resulting in multiple rib fractures. Will need Snf at discharge.  Acute hyperactive delirium due to multiple etiologies Likely due to multiple reasons including cognitive impairment/dementia, acute pain. Dtr would like to avoid opiates if at all possible. Prn IV  haldol. Dtr aware of cardiovascular risks and agrees to use IV haldol.  Constipation Add miralax and dulcolax.  History of stroke On plavix at home. Will hold plavix while taking toradol  BPH (benign prostatic hyperplasia) On flomax  Mild cognitive impairment Diagnosed by neurology last year. Likely worse than previous thought.  GERD  (gastroesophageal reflux disease) Stable. On pepcid  Pacemaker Chronic. Stable.  Chronic combined systolic and diastolic heart failure (HCC) Stable. On entresto. Euvolemic.  Acquired hypothyroidism On synthroid 175 mcg daily. TSH elevated at 45.135. will check FT4.  Change synthroid to at bedtime dosing  DM2 (diabetes mellitus, type 2) (HCC) Stable. On SSI. Continue lantus.  B12 deficiency B12 level low at 161. Will start SQ 1000 mcg daily.   DVT prophylaxis: SCDs Start: 01/22/23 2243     Code Status: Full Code Family Communication: discussed with pt's dtr Dr. Valentino Hue via phone Disposition Plan: SNF placement Reason for continuing need for hospitalization: will need monitoring of pain control.  Objective: Vitals:   01/22/23 0917 01/23/23 0441 01/23/23 1054 01/23/23 1132  BP: (!) 202/118 (!) 149/87 (!) 210/175 (!) 183/105  Pulse: 93 95 (!) 102 89  Resp: 20 18 18    Temp: 99.5 F (37.5 C) 98.6 F (37 C) 99 F (37.2 C)   TempSrc: Oral Axillary Oral   SpO2: 92% 95% 96%   Weight:      Height:       No intake or output data in the 24 hours ending 01/23/23 1242 Filed Weights   01/21/23 2104  Weight: 108 kg    Examination:  Physical Exam Vitals and nursing note reviewed.  Constitutional:      General: He is not in acute distress.    Appearance: He is obese. He is not toxic-appearing or diaphoretic.  HENT:     Head: Normocephalic and atraumatic.     Nose: Nose normal.  Eyes:     General: No scleral icterus. Cardiovascular:     Rate and Rhythm: Normal rate and regular rhythm.  Pulmonary:     Effort: Pulmonary effort is normal.     Breath sounds: Normal breath sounds.  Abdominal:     General: Bowel sounds are normal. There is no distension.     Palpations: Abdomen is soft.     Tenderness: There is no abdominal tenderness.  Musculoskeletal:     Right lower leg: No edema.     Left lower leg: No edema.  Skin:    General: Skin is warm and dry.      Capillary Refill: Capillary refill takes less than 2 seconds.  Neurological:     General: No focal deficit present.     Mental Status: He is alert. He is disoriented.     Comments: Did not know time. Knew he was in the hospital. Able to tell me his dtr's name and profession.     Data Reviewed: I have personally reviewed following labs and imaging studies  CBC: Recent Labs  Lab 01/21/23 2146 01/23/23 0414  WBC 12.5* 12.0*  NEUTROABS 9.9* 9.6*  HGB 14.7 13.2  HCT 43.9 40.8  MCV 87.6 92.3  PLT 173 149*   Basic Metabolic Panel: Recent Labs  Lab 01/21/23 2146 01/23/23 0414  NA 134* 135  K 3.8 3.5  CL 101 102  CO2 22 25  GLUCOSE 260* 267*  BUN 19 22  CREATININE 1.19 1.35*  CALCIUM 8.7* 8.6*  MG  --  1.7  PHOS  --  2.7  GFR: Estimated Creatinine Clearance: 60.9 mL/min (A) (by C-G formula based on SCr of 1.35 mg/dL (H)). Liver Function Tests: Recent Labs  Lab 01/21/23 2146 01/23/23 0414  AST 15 20  ALT 13 12  ALKPHOS 56 53  BILITOT 3.0* 3.1*  PROT 7.7 6.8  ALBUMIN 4.1 3.7   HbA1C: Recent Labs    01/23/23 0414  HGBA1C 8.4*   CBG: Recent Labs  Lab 01/22/23 1213 01/22/23 1834 01/23/23 0424 01/23/23 1110  GLUCAP 236* 250* 254* 221*   Thyroid Function Tests: Recent Labs    01/23/23 0414  TSH 45.135*   Anemia Panel: Recent Labs    01/23/23 0414  VITAMINB12 161*   Sepsis Labs: Recent Labs  Lab 01/23/23 0414  PROCALCITON <0.10     Radiology Studies: DG Chest Portable 1 View  Result Date: 01/23/2023 CLINICAL DATA:  Painful spasms of chest EXAM: PORTABLE CHEST 1 VIEW COMPARISON:  Chest radiograph 2 days prior and chest CT 1 day prior FINDINGS: The left chest wall cardiac device and associated leads and aortic valve prosthesis are stable. The cardiomediastinal silhouette is stable. Aeration of the lungs is unchanged, with no new or worsening focal airspace disease. There is no pulmonary edema. There is no pleural effusion or pneumothorax. Known  left rib fractures are again seen with worsened displacement since the study from 2 days prior. IMPRESSION: 1. Stable aeration of the lungs with no new or worsening airspace disease. 2. Known left rib fractures again seen with worsened displacement compared to the study from 2 days prior. No pneumothorax. Electronically Signed   By: Lesia Hausen M.D.   On: 01/23/2023 08:51   CT Chest Wo Contrast  Result Date: 01/22/2023 CLINICAL DATA:  Chest trauma, blunt. EXAM: CT CHEST WITHOUT CONTRAST TECHNIQUE: Multidetector CT imaging of the chest was performed following the standard protocol without IV contrast. RADIATION DOSE REDUCTION: This exam was performed according to the departmental dose-optimization program which includes automated exposure control, adjustment of the mA and/or kV according to patient size and/or use of iterative reconstruction technique. COMPARISON:  01/19/2023. FINDINGS: Cardiovascular: Heart is enlarged and there is a trace pericardial effusion. Pacemaker leads are present in the heart. A TAVR stent is noted. Multi-vessel coronary artery calcifications are noted. 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 The pulmonary trunk is normal in caliber. Mediastinum/Nodes: No mediastinal or axillary lymphadenopathy. Evaluation of the hila is limited due to lack of IV contrast. The trachea and esophagus are within normal limits. There is a small hiatal hernia. Lungs/Pleura: There are trace bilateral pleural effusions. Atelectasis is noted bilaterally. There is a stable 4 mm nodule in the left upper lobe, axial image 56. No pneumothorax. Upper Abdomen: No acute abnormality. Musculoskeletal: A pacemaker device is noted in the anterior chest wall on the left. Degenerative changes are present in the thoracic spine. There are fractures of the T4, T5, T6, T7, and T8 ribs on the left. IMPRESSION: 1. Fractures of the T4 through T8 ribs on  the left. 2. Trace bilateral pleural effusions with atelectasis. 3. Stable 4 mm left upper lobe pulmonary nodule. No follow-up needed if patient is low-risk.This recommendation follows the consensus statement: Guidelines for Management of Incidental Pulmonary Nodules Detected on CT Images: From the Fleischner Society 2017; Radiology 2017; 284:228-243. 4. Aortic atherosclerosis and coronary artery calcifications. Electronically Signed   By: Thornell Sartorius M.D.   On: 01/22/2023 04:40   CT Head Wo Contrast  Result  Date: 01/22/2023 CLINICAL DATA:  Minor head trauma EXAM: CT HEAD WITHOUT CONTRAST TECHNIQUE: Contiguous axial images were obtained from the base of the skull through the vertex without intravenous contrast. RADIATION DOSE REDUCTION: This exam was performed according to the departmental dose-optimization program which includes automated exposure control, adjustment of the mA and/or kV according to patient size and/or use of iterative reconstruction technique. COMPARISON:  09/16/2022 FINDINGS: Brain: No evidence of acute infarction, hemorrhage, hydrocephalus, extra-axial collection or mass lesion/mass effect. Generalized atrophy with ventriculomegaly. Mild chronic small vessel ischemia in the cerebral white matter. Vascular: No hyperdense vessel or unexpected calcification. Skull: Normal. Negative for fracture or focal lesion. Sinuses/Orbits: No acute finding. IMPRESSION: No acute or reversible finding. Electronically Signed   By: Tiburcio Pea M.D.   On: 01/22/2023 04:00   DG Ribs Unilateral W/Chest Left  Result Date: 01/21/2023 CLINICAL DATA:  Fall, known fracture, worsening pain. Shortness of breath, difficulty moving. EXAM: LEFT RIBS AND CHEST - 3+ VIEW COMPARISON:  01/19/2023. FINDINGS: There is partial visualization of a T5 rib fracture on the left. The T4 rib fracture is not seen due to overlying pacemaker. There are new rib fractures at T7 and T8 on the left. There is no evidence of  pneumothorax or pleural effusion. Atelectasis is noted at the lung bases bilaterally. Heart size and mediastinal contours are stable. A TAVR stent is noted. A dual lead pacemaker is present over the left chest. IMPRESSION: 1. New rib fractures at T7 and T8 on the left. 2. Known left T5 rib fracture is seen. The previously described T4 rib fracture is not seen due to overlying pacemaker. 3. Atelectasis at the lung bases. Electronically Signed   By: Thornell Sartorius M.D.   On: 01/21/2023 23:27    Scheduled Meds:  acetaminophen  1,000 mg Oral Q6H   aspirin EC  81 mg Oral Daily   atorvastatin  40 mg Oral Daily   carvedilol  3.125 mg Oral BID WC   cyanocobalamin  1,000 mcg Subcutaneous Daily   famotidine  20 mg Oral Daily   Finerenone  1 tablet Oral Daily   gabapentin  100 mg Oral QHS   insulin aspart  0-15 Units Subcutaneous TID WC   insulin aspart  0-5 Units Subcutaneous QHS   insulin glargine-yfgn  12 Units Subcutaneous BID   ketorolac  15 mg Intravenous Q6H   [START ON 01/24/2023] levothyroxine  175 mcg Oral QHS   lidocaine  1 patch Transdermal Q24H   melatonin  10 mg Oral QHS   memantine  10 mg Oral BID   polyethylene glycol  34 g Oral Daily   sacubitril-valsartan  1 tablet Oral Daily   sertraline  200 mg Oral Daily   sterile water (preservative free)       tamsulosin  0.4 mg Oral q morning   Continuous Infusions:  methocarbamol (ROBAXIN) IV       LOS: 1 day   Time spent: 45 minutes  Carollee Herter, DO  [facility]  01/23/2023, 12:42 PM
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
## 2023-01-23 NOTE — ED Notes (Signed)
Patient was not in restraints on RN arrival. Restraints were ordered. Unknown if restraints were ever put on.

## 2023-01-23 NOTE — Assessment & Plan Note (Signed)
No evidence of infection. Procal is negative. Pt does not have sepsis. SIRS due to fall and pain associated with rib fractures. Will not start abx. Dtr is agreeable.

## 2023-01-23 NOTE — Assessment & Plan Note (Signed)
Stable. On entresto. Euvolemic.

## 2023-01-23 NOTE — Assessment & Plan Note (Addendum)
Pt given miralax and dulcolax. Will Rx prn miralax.

## 2023-01-23 NOTE — Assessment & Plan Note (Signed)
During hospitalization, pain controlled with scheduled tylenol 1000 mg q6h and scheduled IV toradol 15 mg q6h. Pt's family would like to avoid opiates as this causes confusion with the patient.  At discharge to SNF, will continue with scheduled tylenol 1000 mg q6h x 7 days( at 6 am, 12 pm, 6 pm, 12 am). Scheduled ibuprofen 400 mg q6h x 7 days( at 9 am, 3 pm, 9 pm, 3 am).  Hold plavix while on ibuprofen.  Continue with incentive spirometry q4h while awake.  Ambulate patient often.

## 2023-01-23 NOTE — Progress Notes (Signed)
Pt's auth approved... 9/25-9/26 Next review date is 9/27.  Auth [id number].  Notified admissions rep, Tracey, at [facility] of approval and plan for admission.

## 2023-01-23 NOTE — Assessment & Plan Note (Signed)
Diagnosed by neurology last year. Likely worse than previous thought.

## 2023-01-23 NOTE — Assessment & Plan Note (Signed)
Stable. On SSI. Continue lantus.

## 2023-01-23 NOTE — Progress Notes (Addendum)
CSW notified by admissions rep that pt will need to have no behaviors for 24 hours prior to admission. Pt can admit tomorrow given there are no behaviors. Admission noted RN note and order. Notified Attending Physician via secure chat.

## 2023-01-23 NOTE — Assessment & Plan Note (Signed)
Stable. On pepcid. ?

## 2023-01-23 NOTE — Assessment & Plan Note (Signed)
Chronic Stable 

## 2023-01-23 NOTE — Progress Notes (Signed)
   01/23/23 2238  BiPAP/CPAP/SIPAP  $ Non-Invasive Ventilator  Non-Invasive Vent Set Up  $ Non-Invasive Home Ventilator  Initial  $ Face Mask Medium Yes  BiPAP/CPAP/SIPAP Pt Type Adult  BiPAP/CPAP/SIPAP Resmed  Mask Type Full face mask  Mask Size Medium  FiO2 (%) 21 %  Patient Home Equipment No  Auto Titrate Yes (5-20)  Nasal massage performed No (comment)

## 2023-01-24 ENCOUNTER — Other Ambulatory Visit (HOSPITAL_COMMUNITY): Payer: Self-pay

## 2023-01-24 DIAGNOSIS — E1129 Type 2 diabetes mellitus with other diabetic kidney complication: Secondary | ICD-10-CM | POA: Diagnosis not present

## 2023-01-24 DIAGNOSIS — R296 Repeated falls: Secondary | ICD-10-CM | POA: Diagnosis not present

## 2023-01-24 DIAGNOSIS — Z794 Long term (current) use of insulin: Secondary | ICD-10-CM | POA: Diagnosis not present

## 2023-01-24 DIAGNOSIS — Z7401 Bed confinement status: Secondary | ICD-10-CM | POA: Diagnosis not present

## 2023-01-24 DIAGNOSIS — I10 Essential (primary) hypertension: Secondary | ICD-10-CM | POA: Diagnosis not present

## 2023-01-24 DIAGNOSIS — G3184 Mild cognitive impairment, so stated: Secondary | ICD-10-CM | POA: Diagnosis not present

## 2023-01-24 DIAGNOSIS — I5042 Chronic combined systolic (congestive) and diastolic (congestive) heart failure: Secondary | ICD-10-CM | POA: Diagnosis not present

## 2023-01-24 DIAGNOSIS — E119 Type 2 diabetes mellitus without complications: Secondary | ICD-10-CM | POA: Diagnosis not present

## 2023-01-24 DIAGNOSIS — E785 Hyperlipidemia, unspecified: Secondary | ICD-10-CM | POA: Diagnosis not present

## 2023-01-24 DIAGNOSIS — R6510 Systemic inflammatory response syndrome (SIRS) of non-infectious origin without acute organ dysfunction: Secondary | ICD-10-CM | POA: Diagnosis not present

## 2023-01-24 DIAGNOSIS — R2681 Unsteadiness on feet: Secondary | ICD-10-CM | POA: Diagnosis not present

## 2023-01-24 DIAGNOSIS — Z8673 Personal history of transient ischemic attack (TIA), and cerebral infarction without residual deficits: Secondary | ICD-10-CM | POA: Diagnosis not present

## 2023-01-24 DIAGNOSIS — G8911 Acute pain due to trauma: Secondary | ICD-10-CM | POA: Diagnosis not present

## 2023-01-24 DIAGNOSIS — F05 Delirium due to known physiological condition: Secondary | ICD-10-CM | POA: Diagnosis not present

## 2023-01-24 DIAGNOSIS — S2242XD Multiple fractures of ribs, left side, subsequent encounter for fracture with routine healing: Secondary | ICD-10-CM | POA: Diagnosis not present

## 2023-01-24 DIAGNOSIS — E039 Hypothyroidism, unspecified: Secondary | ICD-10-CM | POA: Diagnosis not present

## 2023-01-24 DIAGNOSIS — E1140 Type 2 diabetes mellitus with diabetic neuropathy, unspecified: Secondary | ICD-10-CM | POA: Diagnosis not present

## 2023-01-24 DIAGNOSIS — F0390 Unspecified dementia without behavioral disturbance: Secondary | ICD-10-CM | POA: Diagnosis not present

## 2023-01-24 DIAGNOSIS — S2242XA Multiple fractures of ribs, left side, initial encounter for closed fracture: Secondary | ICD-10-CM | POA: Diagnosis not present

## 2023-01-24 DIAGNOSIS — E538 Deficiency of other specified B group vitamins: Secondary | ICD-10-CM | POA: Diagnosis not present

## 2023-01-24 DIAGNOSIS — G4733 Obstructive sleep apnea (adult) (pediatric): Secondary | ICD-10-CM | POA: Diagnosis not present

## 2023-01-24 LAB — COMPREHENSIVE METABOLIC PANEL
ALT: 14 U/L (ref 0–44)
AST: 22 U/L (ref 15–41)
Albumin: 3.4 g/dL — ABNORMAL LOW (ref 3.5–5.0)
Alkaline Phosphatase: 58 U/L (ref 38–126)
Anion gap: 10 (ref 5–15)
BUN: 29 mg/dL — ABNORMAL HIGH (ref 8–23)
CO2: 24 mmol/L (ref 22–32)
Calcium: 8.8 mg/dL — ABNORMAL LOW (ref 8.9–10.3)
Chloride: 100 mmol/L (ref 98–111)
Creatinine, Ser: 1.26 mg/dL — ABNORMAL HIGH (ref 0.61–1.24)
GFR, Estimated: >60 mL/min (ref >60)
Glucose, Bld: 183 mg/dL — ABNORMAL HIGH (ref 70–99)
Potassium: 3.1 mmol/L — ABNORMAL LOW (ref 3.5–5.1)
Sodium: 134 mmol/L — ABNORMAL LOW (ref 135–145)
Total Bilirubin: 3.2 mg/dL — ABNORMAL HIGH (ref 0.3–1.2)
Total Protein: 7 g/dL (ref 6.5–8.1)

## 2023-01-24 LAB — GLUCOSE, CAPILLARY
Glucose-Capillary: 169 mg/dL — ABNORMAL HIGH (ref 70–99)
Glucose-Capillary: 187 mg/dL — ABNORMAL HIGH (ref 70–99)

## 2023-01-24 MED ORDER — ASPIRIN 81 MG PO TBEC: 81.0000 mg | DELAYED_RELEASE_TABLET | Freq: Every day | ORAL | Status: AC

## 2023-01-24 MED ORDER — GABAPENTIN 100 MG PO CAPS: 100.0000 mg | ORAL_CAPSULE | Freq: Every day | ORAL | 0 refills | Status: DC

## 2023-01-24 MED ORDER — CLOPIDOGREL BISULFATE 75 MG PO TABS: 75.0000 mg | ORAL_TABLET | Freq: Every day | ORAL | Status: DC

## 2023-01-24 MED ORDER — KERENDIA 10 MG PO TABS: 1.0000 | ORAL_TABLET | Freq: Every day | ORAL | Status: AC

## 2023-01-24 MED ORDER — MELATONIN 10 MG PO TABS: 10.0000 mg | ORAL_TABLET | Freq: Every day | ORAL | Status: AC

## 2023-01-24 MED ORDER — POTASSIUM CHLORIDE CRYS ER 20 MEQ PO TBCR
40.0000 meq | EXTENDED_RELEASE_TABLET | ORAL | Status: DC
  Administered 2023-01-24: 40 meq via ORAL
  Filled 2023-01-24: qty 2

## 2023-01-24 MED ORDER — FAMOTIDINE 20 MG PO TABS: 20.0000 mg | ORAL_TABLET | Freq: Every day | ORAL | Status: AC

## 2023-01-24 MED ORDER — IBUPROFEN 800 MG PO TABS: 800.0000 mg | ORAL_TABLET | Freq: Four times a day (QID) | ORAL | 0 refills | Status: AC

## 2023-01-24 MED ORDER — LEVOTHYROXINE SODIUM 175 MCG PO TABS: 175.0000 ug | ORAL_TABLET | Freq: Every day | ORAL | Status: AC

## 2023-01-24 MED ORDER — SERTRALINE HCL 100 MG PO TABS: 200.0000 mg | ORAL_TABLET | Freq: Every day | ORAL | Status: AC

## 2023-01-24 MED ORDER — POLYETHYLENE GLYCOL 3350 17 G PO PACK: 17.0000 g | PACK | Freq: Every day | ORAL | Status: DC | PRN

## 2023-01-24 MED ORDER — ACETAMINOPHEN 500 MG PO TABS: 1000.0000 mg | ORAL_TABLET | Freq: Four times a day (QID) | ORAL | Status: AC

## 2023-01-24 MED ORDER — LIDOCAINE 5 % EX PTCH: 1.0000 | MEDICATED_PATCH | CUTANEOUS | 0 refills | Status: DC

## 2023-01-24 MED ORDER — VITAMIN B-12 100 MCG PO TABS
100.0000 ug | ORAL_TABLET | Freq: Every day | ORAL | Status: AC
Start: 2023-01-24 — End: 2024-01-24

## 2023-01-24 NOTE — TOC Transition Note (Addendum)
Transition of Care [facility]) - CM/SW Discharge Note   Patient Details  Name: Tony [name] MRN: [id number] Date of Birth: 28-Mar-1951  [facility] CM/SW Contact:  Larrie Kass, LCSW Phone Number: 01/24/2023, 10:53 AM   Clinical Narrative:    Pt to d/c to [facility]. Pt's room 207, RN to call report [phone]. CSW spoke with pt's daughter [name] , she agreed with d/c plan. PTAR called no further TOC needs, TOC sgin off.    Final next level of care: Skilled Nursing Facility Barriers to Discharge: No Barriers Identified   Patient Goals and CMS Choice CMS Medicare.gov Compare Post Acute Care list provided to:: Patient Choice offered to / list presented to : Patient  Discharge Placement                Patient chooses bed at: [facility] Patient to be transferred to facility by: EMS Name of family member notified: Fix-Hicks,M.D.,Megan Patient and family notified of of transfer: 01/24/23  Discharge Plan and Services Additional resources added to the After Visit Summary for                                       Social Determinants of Health (SDOH) Interventions SDOH Screenings   Food Insecurity: No Food Insecurity (01/23/2023)  Housing: Low Risk  (01/23/2023)  Transportation Needs: No Transportation Needs (01/23/2023)  Utilities: Not At Risk (01/23/2023)  Depression (PHQ2-9): Low Risk  (03/09/2022)  Recent Concern: Depression (PHQ2-9) - Medium Risk (12/21/2021)  Tobacco Use: Low Risk  (01/23/2023)     Readmission Risk Interventions    08/30/2021   12:53 PM  Readmission Risk Prevention Plan  Post Dischage Appt Complete  Medication Screening Complete  Transportation Screening Complete

## 2023-01-24 NOTE — Plan of Care (Signed)
?  Problem: Activity: ?Goal: Risk for activity intolerance will decrease ?Outcome: Progressing ?  ?Problem: Pain Managment: ?Goal: General experience of comfort will improve ?Outcome: Progressing ?  ?Problem: Safety: ?Goal: Ability to remain free from injury will improve ?Outcome: Progressing ?  ?

## 2023-01-24 NOTE — Progress Notes (Signed)
Called report to nurse Mia at [facility]. PTAR called by SW. Home meds from pharmacy returned to pt. Pt resting in recliner with no needs or concerns at this time. Will continue to monitor

## 2023-01-24 NOTE — Discharge Summary (Signed)
[facility] Physician Discharge Summary   Patient name: Tony [name]  Admit date:     01/21/2023  Discharge date: 01/24/2023  Attending Physician: Angie Fava [[id number]]  Discharge Physician: Carollee Herter   PCP: Emilio Aspen, MD  Admitted From: Home  Disposition:   [facility]  Recommendations for Outpatient Follow-up:  Follow up with PCP in 4 weeks Repeat TSH and B12 levels in 6 weeks  Home Health:No Equipment/Devices: None    Discharge Condition:Stable CODE STATUS:FULL Diet recommendation: Diabetic Fluid Restriction: None  Hospital Summary: HPI: Tony [name] is a 72 y.o. male with medical history significant for type 2 diabetes mellitus, chronic combined systolic/diastolic heart failure, complete heart block status post pacemaker, severe aortic stenosis status post TAVR in May 2023, acquired hypothyroidism, who is admitted to [facility] on 01/21/2023 with multiple consecutive left-sided rib fractures after presenting from home to [facility] ED complaining of falls.    In the setting of the patient's altered mental status, the following history is provided via my discussions with the EDP and via chart review.   At baseline, the patient lives at home, and is able to perform his own ADLs.  2 days ago, he presented to the emergency department with a ground-level mechanical fall at which time he was found on chest x-ray to have T7 and T8 left-sided rib fractures without displacement.  He was subsequent discharged back to home from the emergency department.  However, he presented back to the ED yesterday after interval 3 additional ground-level mechanical falls with ensuing imaging showing interval additional rib fractures, namely left-sided rib fractures involving T4-T8.  At that time, the decision was made to board the patient in the ED while seeking placement for the patient.  In the interval, he has become progressively more confused relative to his baseline  mental status.  Significant Events: Admitted 01/21/2023 due to pain and confusion   Significant Labs:   Significant Imaging Studies: CT chest 1. Fractures of the T4 through T8 ribs on the left. 2. Trace bilateral pleural effusions with atelectasis. 3. Stable 4 mm left upper lobe pulmonary nodule. No follow-up needed if patient is low-risk  Antibiotic Therapy: Anti-infectives (From admission, onward)    None       Procedures:   Consultants:    Hospital Course by Problem: * Multiple rib fractures During hospitalization, pain controlled with scheduled tylenol 1000 mg q6h and scheduled IV toradol 15 mg q6h. Pt's family would like to avoid opiates as this causes confusion with the patient.  At discharge to SNF, will continue with scheduled tylenol 1000 mg q6h x 7 days( at 6 am, 12 pm, 6 pm, 12 am). Scheduled ibuprofen 400 mg q6h x 7 days( at 9 am, 3 pm, 9 pm, 3 am).  Hold plavix while on ibuprofen.  Continue with incentive spirometry q4h while awake.  Ambulate patient often.  SIRS (systemic inflammatory response syndrome) (HCC) No evidence of infection. Procal is negative. Pt does not have sepsis. SIRS due to fall and pain associated with rib fractures. Will not start abx. Dtr is agreeable.  Frequent falls Has had multiple falls in last week resulting in multiple rib fractures. Will need Snf at discharge.  Acute hyperactive delirium due to multiple etiologies Likely due to multiple reasons including cognitive impairment/dementia, acute pain. Dtr would like to avoid opiates if at all possible. Pt only required 2 doses of Prn IV haldol during hospitalization  At discharge, no haldol will be prescribed. Now  resolved.    Constipation Pt given miralax and dulcolax. Will Rx prn miralax.  History of stroke On plavix at home. Will hold plavix while taking ibuprofen. Will place on temporary ASA 81 mg daily while on ibuprofen.  After ibuprofen stops, will stop ASA as well and  restart plavix 75 mg daily.  BPH (benign prostatic hyperplasia) On flomax  Mild cognitive impairment Diagnosed by neurology last year. Likely worse than previous thought.  GERD (gastroesophageal reflux disease) Stable. On pepcid  Pacemaker Chronic. Stable.  Chronic combined systolic and diastolic heart failure (HCC) Stable. On entresto. Euvolemic.  Acquired hypothyroidism On synthroid 175 mcg daily. TSH elevated at 45.135. will check FT4.  Changed synthroid to at bedtime dosing  Dtr states pt has had total thyroidectomy. Will need repeat TSH, FT4 in PCP office in 6 weeks prior to any dosage changes. Dtr thinks pt has not been taking synthroid on a regular basis.  DM2 (diabetes mellitus, type 2) (HCC) Stable. On SSI. Continue lantus.  B12 deficiency B12 level low at 161. Pt given 2 doses of SQ 1000 mcg daily. Start po Vit B12 1000 mcg daily. Will need repeat B12 level in 6 weeks in PCP office.  Hopefully his B12 deficiency may play a role in his cognitive impairment and treating his B12 deficiency will improve his dementia-symptoms.    Discharge Diagnoses:  Principal Problem:   Multiple rib fractures Active Problems:   Acute hyperactive delirium due to multiple etiologies   Frequent falls   SIRS (systemic inflammatory response syndrome) (HCC)   DM2 (diabetes mellitus, type 2) (HCC)   Acquired hypothyroidism   Chronic combined systolic and diastolic heart failure (HCC)   Pacemaker   GERD (gastroesophageal reflux disease)   Mild cognitive impairment   BPH (benign prostatic hyperplasia)   History of stroke   Constipation   B12 deficiency   Discharge Instructions  Discharge Instructions     Call MD for:  difficulty breathing, headache or visual disturbances   Complete by: As directed    Call MD for:  extreme fatigue   Complete by: As directed    Call MD for:  persistant dizziness or light-headedness   Complete by: As directed    Call MD for:  persistant nausea  and vomiting   Complete by: As directed    Call MD for:  severe uncontrolled pain   Complete by: As directed    Call MD for:  temperature >100.4   Complete by: As directed    Diet - low sodium heart healthy   Complete by: As directed    Diet Carb Modified   Complete by: As directed    Discharge instructions   Complete by: As directed    1. Followup with your primary care physician within 2-4 weeks following hospital discharge 2. Repeat B12 level in 6 weeks following hospital discharge   Increase activity slowly   Complete by: As directed       Allergies as of 01/24/2023       Reactions   Codeine Itching   Crestor [rosuvastatin Calcium] Other (See Comments)   Leg pain   Metformin Hcl Diarrhea   Simvastatin Other (See Comments)   Leg pain        Medication List     STOP taking these medications    cyclobenzaprine 10 MG tablet Commonly known as: FLEXERIL   magnesium oxide 400 (240 Mg) MG tablet Commonly known as: MAG-OX   Mucinex 600 MG 12 hr tablet Generic drug:  guaiFENesin   oxyCODONE 5 MG immediate release tablet Commonly known as: Oxy IR/ROXICODONE   oxyCODONE-acetaminophen 5-325 MG tablet Commonly known as: PERCOCET/ROXICET   Ozempic (0.25 or 0.5 MG/DOSE) 2 MG/3ML Sopn Generic drug: Semaglutide(0.25 or 0.5MG /DOS)       TAKE these medications    acetaminophen 500 MG tablet Commonly known as: TYLENOL Take 2 tablets (1,000 mg total) by mouth every 6 (six) hours for 7 days. At 6 am, 12 pm, 6 pm, 12 am for 7 days What changed:  how much to take when to take this reasons to take this additional instructions   aspirin EC 81 MG tablet Take 1 tablet (81 mg total) by mouth daily for 7 days. Swallow whole. Start taking on: January 25, 2023 What changed: additional instructions   atorvastatin 40 MG tablet Commonly known as: LIPITOR Take 40 mg by mouth daily.   canagliflozin 300 MG Tabs tablet Commonly known as: INVOKANA Take 300 mg by mouth daily  before breakfast.   carvedilol 3.125 MG tablet Commonly known as: COREG Take 1 tablet (3.125 mg total) by mouth 2 (two) times daily with a meal.   clopidogrel 75 MG tablet Commonly known as: PLAVIX Take 1 tablet (75 mg total) by mouth daily. What changed: Another medication with the same name was added. Make sure you understand how and when to take each.   clopidogrel 75 MG tablet Commonly known as: Plavix Take 1 tablet (75 mg total) by mouth daily. What changed: You were already taking a medication with the same name, and this prescription was added. Make sure you understand how and when to take each.   famotidine 20 MG tablet Commonly known as: PEPCID Take 1 tablet (20 mg total) by mouth daily. Start taking on: January 25, 2023 What changed:  when to take this reasons to take this   gabapentin 100 MG capsule Commonly known as: NEURONTIN Take 1 capsule (100 mg total) by mouth at bedtime. What changed:  when to take this reasons to take this   ibuprofen 800 MG tablet Commonly known as: ADVIL Take 1 tablet (800 mg total) by mouth every 6 (six) hours for 7 days. At 3 pm, 9 pm, 3 am, 9 am for 7 days   Kerendia 10 MG Tabs Generic drug: Finerenone Take 1 tablet (10 mg total) by mouth daily.   Lantus SoloStar 100 UNIT/ML Solostar Pen Generic drug: insulin glargine Inject 30 Units into the skin 2 (two) times daily.   levothyroxine 175 MCG tablet Commonly known as: SYNTHROID Take 1 tablet (175 mcg total) by mouth at bedtime. What changed: when to take this   lidocaine 5 % Commonly known as: LIDODERM Place 1 patch onto the skin daily. Remove & Discard patch within 12 hours or as directed by MD   Melatonin 10 MG Tabs Take 10 mg by mouth at bedtime.   memantine 10 MG tablet Commonly known as: Namenda Take 1 tablet (10 mg total) by mouth 2 (two) times daily. What changed:  when to take this additional instructions Another medication with the same name was removed.  Continue taking this medication, and follow the directions you see here.   OneTouch Delica Plus Lancet33G Misc Apply topically.   polyethylene glycol 17 g packet Commonly known as: MIRALAX / GLYCOLAX Take 17 g by mouth daily as needed.   PRESCRIPTION MEDICATION 1 Device See admin instructions. CPAP- At bedtime and during naps   sacubitril-valsartan 24-26 MG Commonly known as: ENTRESTO Take 1 tablet by  mouth daily.   sertraline 100 MG tablet Commonly known as: ZOLOFT Take 2 tablets (200 mg total) by mouth at bedtime. What changed: when to take this   tamsulosin 0.4 MG Caps capsule Commonly known as: FLOMAX Take 1 capsule (0.4 mg total) by mouth every morning.   vitamin B-12 100 MCG tablet Commonly known as: CYANOCOBALAMIN Take 1 tablet (100 mcg total) by mouth daily.        Contact information for after-discharge care     Destination     [facility] [place] Preferred SNF .   Service: Skilled Nursing Contact information: [address] [place] [address] [phone]                    Allergies  Allergen Reactions   Codeine Itching   Crestor [Rosuvastatin Calcium] Other (See Comments)    Leg pain   Metformin Hcl Diarrhea   Simvastatin Other (See Comments)    Leg pain    Discharge Exam: Vitals:   01/24/23 0331 01/24/23 0656  BP: (!) 150/99 (!) 152/92  Pulse: 79 81  Resp: 19 17  Temp: 98.3 F (36.8 C) 98.2 F (36.8 C)  SpO2: 96% 98%    Physical Exam Vitals and nursing note reviewed.  Constitutional:      General: He is not in acute distress.    Appearance: He is obese. He is not toxic-appearing.  HENT:     Head: Normocephalic and atraumatic.  Eyes:     General: No scleral icterus. Cardiovascular:     Rate and Rhythm: Normal rate and regular rhythm.  Pulmonary:     Effort: Pulmonary effort is normal. No respiratory distress.  Abdominal:     General: There is no distension.     Tenderness: There  is no abdominal tenderness.  Skin:    General: Skin is warm and dry.  Neurological:     Mental Status: He is alert.     The results of significant diagnostics from this hospitalization (including imaging, microbiology, ancillary and laboratory) are listed below for reference.    Microbiology: No results found for this or any previous visit (from the past 240 hour(s)).   Labs: BNP (last 3 results) Recent Labs    09/16/22 1509 01/23/23 0414  BNP 365.2* 644.3*   Basic Metabolic Panel: Recent Labs  Lab 01/21/23 2146 01/23/23 0414 01/24/23 0338  NA 134* 135 134*  K 3.8 3.5 3.1*  CL 101 102 100  CO2 22 25 24   GLUCOSE 260* 267* 183*  BUN 19 22 29*  CREATININE 1.19 1.35* 1.26*  CALCIUM 8.7* 8.6* 8.8*  MG  --  1.7  --   PHOS  --  2.7  --    Liver Function Tests: Recent Labs  Lab 01/21/23 2146 01/23/23 0414 01/24/23 0338  AST 15 20 22   ALT 13 12 14   ALKPHOS 56 53 58  BILITOT 3.0* 3.1* 3.2*  PROT 7.7 6.8 7.0  ALBUMIN 4.1 3.7 3.4*    CBC: Recent Labs  Lab 01/21/23 2146 01/23/23 0414  WBC 12.5* 12.0*  NEUTROABS 9.9* 9.6*  HGB 14.7 13.2  HCT 43.9 40.8  MCV 87.6 92.3  PLT 173 149*   CBG: Recent Labs  Lab 01/23/23 0424 01/23/23 1110 01/23/23 1855 01/23/23 2052 01/24/23 0751  GLUCAP 254* 221* 225* 233* 169*   Hgb A1c Recent Labs    01/23/23 0414  HGBA1C 8.4*   Thyroid function studies Recent Labs    01/23/23 0414  TSH 45.135*   Anemia work up Recent Labs    01/23/23 0414  VITAMINB12 161*   Urinalysis    Component Value Date/Time   COLORURINE YELLOW 01/23/2023 1523   APPEARANCEUR CLEAR 01/23/2023 1523   LABSPEC 1.022 01/23/2023 1523   PHURINE 6.0 01/23/2023 1523   GLUCOSEU >=500 (A) 01/23/2023 1523   HGBUR MODERATE (A) 01/23/2023 1523   BILIRUBINUR NEGATIVE 01/23/2023 1523   KETONESUR 5 (A) 01/23/2023 1523   PROTEINUR >=300 (A) 01/23/2023 1523   NITRITE NEGATIVE 01/23/2023 1523   LEUKOCYTESUR NEGATIVE 01/23/2023 1523   Sepsis  Labs Recent Labs  Lab 01/21/23 2146 01/23/23 0414  WBC 12.5* 12.0*    Procedures/Studies: DG Chest Portable 1 View  Result Date: 01/23/2023 CLINICAL DATA:  Painful spasms of chest EXAM: PORTABLE CHEST 1 VIEW COMPARISON:  Chest radiograph 2 days prior and chest CT 1 day prior FINDINGS: The left chest wall cardiac device and associated leads and aortic valve prosthesis are stable. The cardiomediastinal silhouette is stable. Aeration of the lungs is unchanged, with no new or worsening focal airspace disease. There is no pulmonary edema. There is no pleural effusion or pneumothorax. Known left rib fractures are again seen with worsened displacement since the study from 2 days prior. IMPRESSION: 1. Stable aeration of the lungs with no new or worsening airspace disease. 2. Known left rib fractures again seen with worsened displacement compared to the study from 2 days prior. No pneumothorax. Electronically Signed   By: Lesia Hausen M.D.   On: 01/23/2023 08:51   CT Chest Wo Contrast  Result Date: 01/22/2023 CLINICAL DATA:  Chest trauma, blunt. EXAM: CT CHEST WITHOUT CONTRAST TECHNIQUE: Multidetector CT imaging of the chest was performed following the standard protocol without IV contrast. RADIATION DOSE REDUCTION: This exam was performed according to the departmental dose-optimization program which includes automated exposure control, adjustment of the mA and/or kV according to patient size and/or use of iterative reconstruction technique. COMPARISON:  01/19/2023. FINDINGS: Cardiovascular: Heart is enlarged and there is a trace pericardial effusion. Pacemaker leads are present in the heart. A TAVR stent is noted. Multi-vessel coronary artery calcifications are noted. 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 The pulmonary trunk is normal in caliber. Mediastinum/Nodes: No mediastinal or axillary lymphadenopathy. Evaluation of the hila  is limited due to lack of IV contrast. The trachea and esophagus are within normal limits. There is a small hiatal hernia. Lungs/Pleura: There are trace bilateral pleural effusions. Atelectasis is noted bilaterally. There is a stable 4 mm nodule in the left upper lobe, axial image 56. No pneumothorax. Upper Abdomen: No acute abnormality. Musculoskeletal: A pacemaker device is noted in the anterior chest wall on the left. Degenerative changes are present in the thoracic spine. There are fractures of the T4, T5, T6, T7, and T8 ribs on the left. IMPRESSION: 1. Fractures of the T4 through T8 ribs on the left. 2. Trace bilateral pleural effusions with atelectasis. 3. Stable 4 mm left upper lobe pulmonary nodule. No follow-up needed if patient is low-risk.This recommendation follows the consensus statement: Guidelines for Management of Incidental Pulmonary Nodules Detected on CT Images: From the Fleischner Society 2017; Radiology 2017; 284:228-243. 4. Aortic atherosclerosis and coronary artery calcifications. Electronically Signed   By: Thornell Sartorius M.D.   On: 01/22/2023 04:40   CT Head Wo Contrast  Result Date: 01/22/2023 CLINICAL DATA:  Minor head trauma EXAM: CT HEAD WITHOUT CONTRAST TECHNIQUE: Contiguous axial images were  obtained from the base of the skull through the vertex without intravenous contrast. RADIATION DOSE REDUCTION: This exam was performed according to the departmental dose-optimization program which includes automated exposure control, adjustment of the mA and/or kV according to patient size and/or use of iterative reconstruction technique. COMPARISON:  09/16/2022 FINDINGS: Brain: No evidence of acute infarction, hemorrhage, hydrocephalus, extra-axial collection or mass lesion/mass effect. Generalized atrophy with ventriculomegaly. Mild chronic small vessel ischemia in the cerebral white matter. Vascular: No hyperdense vessel or unexpected calcification. Skull: Normal. Negative for fracture or  focal lesion. Sinuses/Orbits: No acute finding. IMPRESSION: No acute or reversible finding. Electronically Signed   By: Tiburcio Pea M.D.   On: 01/22/2023 04:00   DG Ribs Unilateral W/Chest Left  Result Date: 01/21/2023 CLINICAL DATA:  Fall, known fracture, worsening pain. Shortness of breath, difficulty moving. EXAM: LEFT RIBS AND CHEST - 3+ VIEW COMPARISON:  01/19/2023. FINDINGS: There is partial visualization of a T5 rib fracture on the left. The T4 rib fracture is not seen due to overlying pacemaker. There are new rib fractures at T7 and T8 on the left. There is no evidence of pneumothorax or pleural effusion. Atelectasis is noted at the lung bases bilaterally. Heart size and mediastinal contours are stable. A TAVR stent is noted. A dual lead pacemaker is present over the left chest. IMPRESSION: 1. New rib fractures at T7 and T8 on the left. 2. Known left T5 rib fracture is seen. The previously described T4 rib fracture is not seen due to overlying pacemaker. 3. Atelectasis at the lung bases. Electronically Signed   By: Thornell Sartorius M.D.   On: 01/21/2023 23:27   CT CHEST WO CONTRAST  Result Date: 01/19/2023 CLINICAL DATA:  Mechanical fall with left rib and upper back pain EXAM: CT CHEST WITHOUT CONTRAST TECHNIQUE: Multidetector CT imaging of the chest was performed following the standard protocol without IV contrast. RADIATION DOSE REDUCTION: This exam was performed according to the departmental dose-optimization program which includes automated exposure control, adjustment of the mA and/or kV according to patient size and/or use of iterative reconstruction technique. COMPARISON:  Radiograph of the ribs dated 01/19/2023, CTA chest dated 08/14/2021 FINDINGS: Cardiovascular: Chest wall pacemaker leads terminate in the right atrium and ventricle. Status post aortic valve replacement. Normal heart size. No significant pericardial fluid/thickening. Great vessels are normal in course and caliber. Coronary  artery calcifications and aortic atherosclerosis. Mediastinum/Nodes: Imaged thyroid gland without nodules meeting criteria for imaging follow-up by size. Normal esophagus. No pathologically enlarged axillary, supraclavicular, mediastinal, or hilar lymph nodes. Lungs/Pleura: The central airways are patent. Unchanged 4 mm subpleural left upper lobe ground-glass nodule (4:40) no focal consolidation. No pneumothorax. No pleural effusion. Upper abdomen: Nonobstructing 4 mm left renal lower pole stone. Small duodenal diverticulum arising from the third portion. Musculoskeletal: Nondisplaced left lateral fourth and fifth rib fractures. Old anterior left rib fractures. Multilevel degenerative changes of the thoracic spine. IMPRESSION: 1. Nondisplaced left lateral fourth and fifth rib fractures. No pneumothorax. 2. Unchanged 4 mm subpleural left upper lobe ground-glass nodule. No specific follow-up imaging recommended. 3. Nonobstructing 4 mm left renal lower pole stone. 4. Aortic Atherosclerosis (ICD10-I70.0). Coronary artery calcifications. Assessment for potential risk factor modification, dietary therapy or pharmacologic therapy may be warranted, if clinically indicated. Electronically Signed   By: Agustin Cree M.D.   On: 01/19/2023 21:43   DG Thoracic Spine 2 View  Result Date: 01/19/2023 CLINICAL DATA:  Back pain after fall from EXAM: THORACIC SPINE 2 VIEWS COMPARISON:  08/14/2021  FINDINGS: There is no evidence of thoracic spine fracture. Alignment is normal. Mild disc space narrowing hand anterior endplate spurring within the mid to lower thoracic spine. IMPRESSION: Mild degenerative changes of the mid to lower thoracic spine. No acute findings. Electronically Signed   By: Duanne Guess D.O.   On: 01/19/2023 20:15   DG Ribs Unilateral W/Chest Left  Result Date: 01/19/2023 CLINICAL DATA:  Pain post fall EXAM: LEFT RIBS AND CHEST - 3+ VIEW COMPARISON:  09/16/2022 FINDINGS: Stable left subclavian dual lead  transvenous pacemaker. Negative for displaced fracture. No pneumothorax. No pleural effusion. Lungs are clear. Post AVR. IMPRESSION: Negative Electronically Signed   By: Corlis Leak M.D.   On: 01/19/2023 20:15    Time coordinating discharge: 40 mins  SIGNED:  Carollee Herter, DO [facility] 01/24/23, 10:44 AM

## 2023-01-24 NOTE — Progress Notes (Signed)
Physical Therapy Treatment Patient Details Name: Tony [name] MRN: [id number] DOB: 1950/06/22 Today's Date: 01/24/2023   History of Present Illness Patient is a 72 year old male presenting to ED with pain. ED visit 9/21 after sustaining a fall. Imaging (+) acute rib fxs. Discharged home. Presented to ED again on 9/23 with uncontrolled pain. Pt sustained a fall while in ED. Imaging (+) acute rib fxs. Hx of DM, HB, pacemaker, HF, hypothyroidism, OSA, neuropathy    PT Comments  Pt assisted with ambulating in hallway and initially requiring mod assist for stability and ataxic gait pattern.  Pt improved to min assist with short seated rest break.  Daughter present today and anticipates d/c to SNF today.     If plan is discharge home, recommend the following: Assistance with cooking/housework;Assist for transportation;Help with stairs or ramp for entrance;A lot of help with bathing/dressing/bathroom;A lot of help with walking and/or transfers   Can travel by private vehicle     No  Equipment Recommendations  Rolling walker (2 wheels)    Recommendations for Other Services       Precautions / Restrictions Precautions Precautions: Fall     Mobility  Bed Mobility Overal bed mobility: Needs Assistance Bed Mobility: Supine to Sit     Supine to sit: Mod assist     General bed mobility comments: assist for trunk upright due to rib fractures/pain (no pain at rest)    Transfers Overall transfer level: Needs assistance Equipment used: Rolling walker (2 wheels) Transfers: Sit to/from Stand Sit to Stand: Min assist           General transfer comment: Assist to rise, stabilize, control descent. Cues for safety, technique, hand placement.    Ambulation/Gait Ambulation/Gait assistance: Mod assist Gait Distance (Feet): 95 Feet Assistive device: Rolling walker (2 wheels) Gait Pattern/deviations: Ataxic, Narrow base of support, Scissoring       General Gait Details: very ataxic  gait initially approx 35 ft with mod assist required for stability; pt took seated rest break then able to ambulate another 95 ft with min assist (improved stability)   Stairs             Wheelchair Mobility     Tilt Bed    Modified Rankin (Stroke Patients Only)       Balance Overall balance assessment: Needs assistance, History of Falls         Standing balance support: Bilateral upper extremity supported, During functional activity, Reliant on assistive device for balance Standing balance-Leahy Scale: Zero                              Cognition Arousal: Alert Behavior During Therapy: WFL for tasks assessed/performed                                   General Comments: appropriate during session, following simple commands        Exercises      General Comments        Pertinent Vitals/Pain Pain Assessment Pain Assessment: No/denies pain Pain Intervention(s): Repositioned, Monitored during session    Home Living                          Prior Function            PT Goals (current goals can now be  found in the care plan section) Progress towards PT goals: Progressing toward goals    Frequency    Min 1X/week      PT Plan      Co-evaluation              AM-PAC PT "6 Clicks" Mobility   Outcome Measure  Help needed turning from your back to your side while in a flat bed without using bedrails?: A Lot Help needed moving from lying on your back to sitting on the side of a flat bed without using bedrails?: A Lot Help needed moving to and from a bed to a chair (including a wheelchair)?: A Lot Help needed standing up from a chair using your arms (e.g., wheelchair or bedside chair)?: A Lot Help needed to walk in hospital room?: A Lot Help needed climbing 3-5 steps with a railing? : A Lot 6 Click Score: 12    End of Session Equipment Utilized During Treatment: Gait belt Activity Tolerance: Patient  tolerated treatment well Patient left: in chair;with call bell/phone within reach;with family/visitor present Nurse Communication: Mobility status PT Visit Diagnosis: History of falling (Z91.81);Repeated falls (R29.6);Difficulty in walking, not elsewhere classified (R26.2)     Time: 1610-9604 PT Time Calculation (min) (ACUTE ONLY): 20 min  Charges:    $Gait Training: 8-22 mins PT General Charges $$ ACUTE PT VISIT: 1 Visit                     Paulino Door, DPT Physical Therapist Acute Rehabilitation Services Office: [phone]    Tony [name] 01/24/2023, 1:38 PM

## 2023-01-26 DIAGNOSIS — E039 Hypothyroidism, unspecified: Secondary | ICD-10-CM | POA: Diagnosis not present

## 2023-01-26 DIAGNOSIS — E1140 Type 2 diabetes mellitus with diabetic neuropathy, unspecified: Secondary | ICD-10-CM | POA: Diagnosis not present

## 2023-01-26 DIAGNOSIS — G4733 Obstructive sleep apnea (adult) (pediatric): Secondary | ICD-10-CM | POA: Diagnosis not present

## 2023-01-26 DIAGNOSIS — I10 Essential (primary) hypertension: Secondary | ICD-10-CM | POA: Diagnosis not present

## 2023-01-26 DIAGNOSIS — S2242XA Multiple fractures of ribs, left side, initial encounter for closed fracture: Secondary | ICD-10-CM | POA: Diagnosis not present

## 2023-01-26 DIAGNOSIS — R2681 Unsteadiness on feet: Secondary | ICD-10-CM | POA: Diagnosis not present

## 2023-02-19 DIAGNOSIS — R2681 Unsteadiness on feet: Secondary | ICD-10-CM | POA: Diagnosis not present

## 2023-02-19 DIAGNOSIS — R296 Repeated falls: Secondary | ICD-10-CM | POA: Diagnosis not present

## 2023-02-20 DIAGNOSIS — R2689 Other abnormalities of gait and mobility: Secondary | ICD-10-CM | POA: Diagnosis not present

## 2023-02-20 DIAGNOSIS — R2681 Unsteadiness on feet: Secondary | ICD-10-CM | POA: Diagnosis not present

## 2023-02-20 DIAGNOSIS — R296 Repeated falls: Secondary | ICD-10-CM | POA: Diagnosis not present

## 2023-02-22 DIAGNOSIS — R2681 Unsteadiness on feet: Secondary | ICD-10-CM | POA: Diagnosis not present

## 2023-02-22 DIAGNOSIS — R2689 Other abnormalities of gait and mobility: Secondary | ICD-10-CM | POA: Diagnosis not present

## 2023-02-22 DIAGNOSIS — R296 Repeated falls: Secondary | ICD-10-CM | POA: Diagnosis not present

## 2023-02-25 DIAGNOSIS — R296 Repeated falls: Secondary | ICD-10-CM | POA: Diagnosis not present

## 2023-02-25 DIAGNOSIS — R2681 Unsteadiness on feet: Secondary | ICD-10-CM | POA: Diagnosis not present

## 2023-02-26 DIAGNOSIS — R2689 Other abnormalities of gait and mobility: Secondary | ICD-10-CM | POA: Diagnosis not present

## 2023-02-26 DIAGNOSIS — R296 Repeated falls: Secondary | ICD-10-CM | POA: Diagnosis not present

## 2023-02-26 DIAGNOSIS — R2681 Unsteadiness on feet: Secondary | ICD-10-CM | POA: Diagnosis not present

## 2023-02-28 DIAGNOSIS — R2681 Unsteadiness on feet: Secondary | ICD-10-CM | POA: Diagnosis not present

## 2023-02-28 DIAGNOSIS — R2689 Other abnormalities of gait and mobility: Secondary | ICD-10-CM | POA: Diagnosis not present

## 2023-02-28 DIAGNOSIS — R296 Repeated falls: Secondary | ICD-10-CM | POA: Diagnosis not present

## 2023-03-01 DIAGNOSIS — R2689 Other abnormalities of gait and mobility: Secondary | ICD-10-CM | POA: Diagnosis not present

## 2023-03-01 DIAGNOSIS — R2681 Unsteadiness on feet: Secondary | ICD-10-CM | POA: Diagnosis not present

## 2023-03-01 DIAGNOSIS — R296 Repeated falls: Secondary | ICD-10-CM | POA: Diagnosis not present

## 2023-03-04 DIAGNOSIS — R2681 Unsteadiness on feet: Secondary | ICD-10-CM | POA: Diagnosis not present

## 2023-03-04 DIAGNOSIS — R296 Repeated falls: Secondary | ICD-10-CM | POA: Diagnosis not present

## 2023-03-04 DIAGNOSIS — R2689 Other abnormalities of gait and mobility: Secondary | ICD-10-CM | POA: Diagnosis not present

## 2023-03-05 DIAGNOSIS — R296 Repeated falls: Secondary | ICD-10-CM | POA: Diagnosis not present

## 2023-03-05 DIAGNOSIS — R2689 Other abnormalities of gait and mobility: Secondary | ICD-10-CM | POA: Diagnosis not present

## 2023-03-05 DIAGNOSIS — R2681 Unsteadiness on feet: Secondary | ICD-10-CM | POA: Diagnosis not present

## 2023-03-06 DIAGNOSIS — R2681 Unsteadiness on feet: Secondary | ICD-10-CM | POA: Diagnosis not present

## 2023-03-06 DIAGNOSIS — R296 Repeated falls: Secondary | ICD-10-CM | POA: Diagnosis not present

## 2023-03-07 DIAGNOSIS — R2681 Unsteadiness on feet: Secondary | ICD-10-CM | POA: Diagnosis not present

## 2023-03-07 DIAGNOSIS — R296 Repeated falls: Secondary | ICD-10-CM | POA: Diagnosis not present

## 2023-03-07 DIAGNOSIS — R2689 Other abnormalities of gait and mobility: Secondary | ICD-10-CM | POA: Diagnosis not present

## 2023-03-08 DIAGNOSIS — R296 Repeated falls: Secondary | ICD-10-CM | POA: Diagnosis not present

## 2023-03-08 DIAGNOSIS — R2681 Unsteadiness on feet: Secondary | ICD-10-CM | POA: Diagnosis not present

## 2023-03-11 DIAGNOSIS — R2681 Unsteadiness on feet: Secondary | ICD-10-CM | POA: Diagnosis not present

## 2023-03-11 DIAGNOSIS — R296 Repeated falls: Secondary | ICD-10-CM | POA: Diagnosis not present

## 2023-03-12 DIAGNOSIS — R2681 Unsteadiness on feet: Secondary | ICD-10-CM | POA: Diagnosis not present

## 2023-03-12 DIAGNOSIS — R2689 Other abnormalities of gait and mobility: Secondary | ICD-10-CM | POA: Diagnosis not present

## 2023-03-12 DIAGNOSIS — R296 Repeated falls: Secondary | ICD-10-CM | POA: Diagnosis not present

## 2023-03-13 DIAGNOSIS — R2681 Unsteadiness on feet: Secondary | ICD-10-CM | POA: Diagnosis not present

## 2023-03-13 DIAGNOSIS — R296 Repeated falls: Secondary | ICD-10-CM | POA: Diagnosis not present

## 2023-03-14 DIAGNOSIS — R2681 Unsteadiness on feet: Secondary | ICD-10-CM | POA: Diagnosis not present

## 2023-03-14 DIAGNOSIS — R2689 Other abnormalities of gait and mobility: Secondary | ICD-10-CM | POA: Diagnosis not present

## 2023-03-14 DIAGNOSIS — R296 Repeated falls: Secondary | ICD-10-CM | POA: Diagnosis not present

## 2023-03-15 DIAGNOSIS — R2689 Other abnormalities of gait and mobility: Secondary | ICD-10-CM | POA: Diagnosis not present

## 2023-03-15 DIAGNOSIS — R2681 Unsteadiness on feet: Secondary | ICD-10-CM | POA: Diagnosis not present

## 2023-03-15 DIAGNOSIS — R296 Repeated falls: Secondary | ICD-10-CM | POA: Diagnosis not present

## 2023-03-18 DIAGNOSIS — Z125 Encounter for screening for malignant neoplasm of prostate: Secondary | ICD-10-CM | POA: Diagnosis not present

## 2023-03-18 DIAGNOSIS — N1831 Chronic kidney disease, stage 3a: Secondary | ICD-10-CM | POA: Diagnosis not present

## 2023-03-18 DIAGNOSIS — E1121 Type 2 diabetes mellitus with diabetic nephropathy: Secondary | ICD-10-CM | POA: Diagnosis not present

## 2023-03-18 DIAGNOSIS — I130 Hypertensive heart and chronic kidney disease with heart failure and stage 1 through stage 4 chronic kidney disease, or unspecified chronic kidney disease: Secondary | ICD-10-CM | POA: Diagnosis not present

## 2023-03-18 DIAGNOSIS — E113291 Type 2 diabetes mellitus with mild nonproliferative diabetic retinopathy without macular edema, right eye: Secondary | ICD-10-CM | POA: Diagnosis not present

## 2023-03-18 DIAGNOSIS — E1122 Type 2 diabetes mellitus with diabetic chronic kidney disease: Secondary | ICD-10-CM | POA: Diagnosis not present

## 2023-03-18 DIAGNOSIS — E7800 Pure hypercholesterolemia, unspecified: Secondary | ICD-10-CM | POA: Diagnosis not present

## 2023-03-18 DIAGNOSIS — R2681 Unsteadiness on feet: Secondary | ICD-10-CM | POA: Diagnosis not present

## 2023-03-18 DIAGNOSIS — I5022 Chronic systolic (congestive) heart failure: Secondary | ICD-10-CM | POA: Diagnosis not present

## 2023-03-18 DIAGNOSIS — E538 Deficiency of other specified B group vitamins: Secondary | ICD-10-CM | POA: Diagnosis not present

## 2023-03-18 DIAGNOSIS — Z0000 Encounter for general adult medical examination without abnormal findings: Secondary | ICD-10-CM | POA: Diagnosis not present

## 2023-03-18 DIAGNOSIS — I10 Essential (primary) hypertension: Secondary | ICD-10-CM | POA: Diagnosis not present

## 2023-03-18 DIAGNOSIS — I77810 Thoracic aortic ectasia: Secondary | ICD-10-CM | POA: Diagnosis not present

## 2023-03-18 DIAGNOSIS — F331 Major depressive disorder, recurrent, moderate: Secondary | ICD-10-CM | POA: Diagnosis not present

## 2023-03-18 DIAGNOSIS — Z794 Long term (current) use of insulin: Secondary | ICD-10-CM | POA: Diagnosis not present

## 2023-03-18 DIAGNOSIS — R296 Repeated falls: Secondary | ICD-10-CM | POA: Diagnosis not present

## 2023-03-19 DIAGNOSIS — E1165 Type 2 diabetes mellitus with hyperglycemia: Secondary | ICD-10-CM | POA: Diagnosis not present

## 2023-03-22 DIAGNOSIS — R2681 Unsteadiness on feet: Secondary | ICD-10-CM | POA: Diagnosis not present

## 2023-03-22 DIAGNOSIS — R2689 Other abnormalities of gait and mobility: Secondary | ICD-10-CM | POA: Diagnosis not present

## 2023-03-22 DIAGNOSIS — R296 Repeated falls: Secondary | ICD-10-CM | POA: Diagnosis not present

## 2023-03-25 DIAGNOSIS — R2689 Other abnormalities of gait and mobility: Secondary | ICD-10-CM | POA: Diagnosis not present

## 2023-03-25 DIAGNOSIS — R296 Repeated falls: Secondary | ICD-10-CM | POA: Diagnosis not present

## 2023-03-25 DIAGNOSIS — R2681 Unsteadiness on feet: Secondary | ICD-10-CM | POA: Diagnosis not present

## 2023-03-26 DIAGNOSIS — R2681 Unsteadiness on feet: Secondary | ICD-10-CM | POA: Diagnosis not present

## 2023-03-26 DIAGNOSIS — R2689 Other abnormalities of gait and mobility: Secondary | ICD-10-CM | POA: Diagnosis not present

## 2023-03-26 DIAGNOSIS — R296 Repeated falls: Secondary | ICD-10-CM | POA: Diagnosis not present

## 2023-03-27 DIAGNOSIS — R296 Repeated falls: Secondary | ICD-10-CM | POA: Diagnosis not present

## 2023-03-27 DIAGNOSIS — R2681 Unsteadiness on feet: Secondary | ICD-10-CM | POA: Diagnosis not present

## 2023-03-27 DIAGNOSIS — R2689 Other abnormalities of gait and mobility: Secondary | ICD-10-CM | POA: Diagnosis not present

## 2023-03-29 DIAGNOSIS — R2681 Unsteadiness on feet: Secondary | ICD-10-CM | POA: Diagnosis not present

## 2023-03-29 DIAGNOSIS — R296 Repeated falls: Secondary | ICD-10-CM | POA: Diagnosis not present

## 2023-04-01 DIAGNOSIS — R2681 Unsteadiness on feet: Secondary | ICD-10-CM | POA: Diagnosis not present

## 2023-04-01 DIAGNOSIS — R2689 Other abnormalities of gait and mobility: Secondary | ICD-10-CM | POA: Diagnosis not present

## 2023-04-01 DIAGNOSIS — R296 Repeated falls: Secondary | ICD-10-CM | POA: Diagnosis not present

## 2023-04-02 DIAGNOSIS — R296 Repeated falls: Secondary | ICD-10-CM | POA: Diagnosis not present

## 2023-04-02 DIAGNOSIS — R2681 Unsteadiness on feet: Secondary | ICD-10-CM | POA: Diagnosis not present

## 2023-04-03 DIAGNOSIS — R2689 Other abnormalities of gait and mobility: Secondary | ICD-10-CM | POA: Diagnosis not present

## 2023-04-03 DIAGNOSIS — R2681 Unsteadiness on feet: Secondary | ICD-10-CM | POA: Diagnosis not present

## 2023-04-03 DIAGNOSIS — R296 Repeated falls: Secondary | ICD-10-CM | POA: Diagnosis not present

## 2023-04-05 DIAGNOSIS — R296 Repeated falls: Secondary | ICD-10-CM | POA: Diagnosis not present

## 2023-04-05 DIAGNOSIS — R2689 Other abnormalities of gait and mobility: Secondary | ICD-10-CM | POA: Diagnosis not present

## 2023-04-05 DIAGNOSIS — R2681 Unsteadiness on feet: Secondary | ICD-10-CM | POA: Diagnosis not present

## 2023-04-08 DIAGNOSIS — R296 Repeated falls: Secondary | ICD-10-CM | POA: Diagnosis not present

## 2023-04-08 DIAGNOSIS — R2689 Other abnormalities of gait and mobility: Secondary | ICD-10-CM | POA: Diagnosis not present

## 2023-04-08 DIAGNOSIS — R2681 Unsteadiness on feet: Secondary | ICD-10-CM | POA: Diagnosis not present

## 2023-04-09 DIAGNOSIS — R296 Repeated falls: Secondary | ICD-10-CM | POA: Diagnosis not present

## 2023-04-09 DIAGNOSIS — R2689 Other abnormalities of gait and mobility: Secondary | ICD-10-CM | POA: Diagnosis not present

## 2023-04-09 DIAGNOSIS — R2681 Unsteadiness on feet: Secondary | ICD-10-CM | POA: Diagnosis not present

## 2023-04-10 DIAGNOSIS — R296 Repeated falls: Secondary | ICD-10-CM | POA: Diagnosis not present

## 2023-04-10 DIAGNOSIS — R2681 Unsteadiness on feet: Secondary | ICD-10-CM | POA: Diagnosis not present

## 2023-04-10 DIAGNOSIS — R2689 Other abnormalities of gait and mobility: Secondary | ICD-10-CM | POA: Diagnosis not present

## 2023-04-15 DIAGNOSIS — R296 Repeated falls: Secondary | ICD-10-CM | POA: Diagnosis not present

## 2023-04-15 DIAGNOSIS — R2681 Unsteadiness on feet: Secondary | ICD-10-CM | POA: Diagnosis not present

## 2023-04-15 DIAGNOSIS — R2689 Other abnormalities of gait and mobility: Secondary | ICD-10-CM | POA: Diagnosis not present

## 2023-04-16 DIAGNOSIS — R2689 Other abnormalities of gait and mobility: Secondary | ICD-10-CM | POA: Diagnosis not present

## 2023-04-16 DIAGNOSIS — R2681 Unsteadiness on feet: Secondary | ICD-10-CM | POA: Diagnosis not present

## 2023-04-16 DIAGNOSIS — R296 Repeated falls: Secondary | ICD-10-CM | POA: Diagnosis not present

## 2023-04-17 DIAGNOSIS — R2681 Unsteadiness on feet: Secondary | ICD-10-CM | POA: Diagnosis not present

## 2023-04-17 DIAGNOSIS — R296 Repeated falls: Secondary | ICD-10-CM | POA: Diagnosis not present

## 2023-04-17 DIAGNOSIS — R2689 Other abnormalities of gait and mobility: Secondary | ICD-10-CM | POA: Diagnosis not present

## 2023-04-18 DIAGNOSIS — R296 Repeated falls: Secondary | ICD-10-CM | POA: Diagnosis not present

## 2023-04-18 DIAGNOSIS — R2681 Unsteadiness on feet: Secondary | ICD-10-CM | POA: Diagnosis not present

## 2023-04-19 DIAGNOSIS — R296 Repeated falls: Secondary | ICD-10-CM | POA: Diagnosis not present

## 2023-04-19 DIAGNOSIS — R2681 Unsteadiness on feet: Secondary | ICD-10-CM | POA: Diagnosis not present

## 2023-04-23 ENCOUNTER — Other Ambulatory Visit: Payer: Self-pay

## 2023-04-23 ENCOUNTER — Encounter (HOSPITAL_COMMUNITY): Payer: Self-pay

## 2023-04-23 ENCOUNTER — Emergency Department (HOSPITAL_COMMUNITY): Payer: Medicare PPO

## 2023-04-23 ENCOUNTER — Inpatient Hospital Stay (HOSPITAL_COMMUNITY)
Admission: EM | Admit: 2023-04-23 | Discharge: 2023-04-25 | DRG: 193 | Disposition: A | Discharge status: Home Health | Payer: Medicare PPO | Attending: Internal Medicine | Admitting: Internal Medicine

## 2023-04-23 DIAGNOSIS — Z8616 Personal history of COVID-19: Secondary | ICD-10-CM | POA: Diagnosis not present

## 2023-04-23 DIAGNOSIS — E538 Deficiency of other specified B group vitamins: Secondary | ICD-10-CM | POA: Diagnosis present

## 2023-04-23 DIAGNOSIS — E1122 Type 2 diabetes mellitus with diabetic chronic kidney disease: Secondary | ICD-10-CM | POA: Diagnosis present

## 2023-04-23 DIAGNOSIS — Z79890 Hormone replacement therapy (postmenopausal): Secondary | ICD-10-CM | POA: Diagnosis not present

## 2023-04-23 DIAGNOSIS — E861 Hypovolemia: Secondary | ICD-10-CM | POA: Diagnosis present

## 2023-04-23 DIAGNOSIS — Z8673 Personal history of transient ischemic attack (TIA), and cerebral infarction without residual deficits: Secondary | ICD-10-CM

## 2023-04-23 DIAGNOSIS — Z6833 Body mass index (BMI) 33.0-33.9, adult: Secondary | ICD-10-CM

## 2023-04-23 DIAGNOSIS — I5042 Chronic combined systolic (congestive) and diastolic (congestive) heart failure: Secondary | ICD-10-CM | POA: Diagnosis present

## 2023-04-23 DIAGNOSIS — E039 Hypothyroidism, unspecified: Secondary | ICD-10-CM | POA: Diagnosis not present

## 2023-04-23 DIAGNOSIS — U071 COVID-19: Secondary | ICD-10-CM | POA: Diagnosis present

## 2023-04-23 DIAGNOSIS — E785 Hyperlipidemia, unspecified: Secondary | ICD-10-CM | POA: Diagnosis present

## 2023-04-23 DIAGNOSIS — Z950 Presence of cardiac pacemaker: Secondary | ICD-10-CM

## 2023-04-23 DIAGNOSIS — Z952 Presence of prosthetic heart valve: Secondary | ICD-10-CM | POA: Diagnosis not present

## 2023-04-23 DIAGNOSIS — R4020 Unspecified coma: Secondary | ICD-10-CM | POA: Diagnosis not present

## 2023-04-23 DIAGNOSIS — Z888 Allergy status to other drugs, medicaments and biological substances status: Secondary | ICD-10-CM

## 2023-04-23 DIAGNOSIS — R0989 Other specified symptoms and signs involving the circulatory and respiratory systems: Secondary | ICD-10-CM | POA: Diagnosis not present

## 2023-04-23 DIAGNOSIS — E119 Type 2 diabetes mellitus without complications: Secondary | ICD-10-CM

## 2023-04-23 DIAGNOSIS — E1165 Type 2 diabetes mellitus with hyperglycemia: Secondary | ICD-10-CM | POA: Diagnosis present

## 2023-04-23 DIAGNOSIS — Z8249 Family history of ischemic heart disease and other diseases of the circulatory system: Secondary | ICD-10-CM

## 2023-04-23 DIAGNOSIS — E66811 Obesity, class 1: Secondary | ICD-10-CM | POA: Diagnosis present

## 2023-04-23 DIAGNOSIS — G9341 Metabolic encephalopathy: Secondary | ICD-10-CM | POA: Diagnosis present

## 2023-04-23 DIAGNOSIS — Z885 Allergy status to narcotic agent status: Secondary | ICD-10-CM

## 2023-04-23 DIAGNOSIS — R918 Other nonspecific abnormal finding of lung field: Secondary | ICD-10-CM | POA: Diagnosis not present

## 2023-04-23 DIAGNOSIS — Z801 Family history of malignant neoplasm of trachea, bronchus and lung: Secondary | ICD-10-CM

## 2023-04-23 DIAGNOSIS — R509 Fever, unspecified: Secondary | ICD-10-CM | POA: Diagnosis not present

## 2023-04-23 DIAGNOSIS — Z79899 Other long term (current) drug therapy: Secondary | ICD-10-CM

## 2023-04-23 DIAGNOSIS — G4733 Obstructive sleep apnea (adult) (pediatric): Secondary | ICD-10-CM | POA: Diagnosis present

## 2023-04-23 DIAGNOSIS — E1169 Type 2 diabetes mellitus with other specified complication: Secondary | ICD-10-CM | POA: Diagnosis present

## 2023-04-23 DIAGNOSIS — E1159 Type 2 diabetes mellitus with other circulatory complications: Secondary | ICD-10-CM | POA: Insufficient documentation

## 2023-04-23 DIAGNOSIS — R0902 Hypoxemia: Secondary | ICD-10-CM | POA: Diagnosis not present

## 2023-04-23 DIAGNOSIS — Z794 Long term (current) use of insulin: Secondary | ICD-10-CM

## 2023-04-23 DIAGNOSIS — Z85850 Personal history of malignant neoplasm of thyroid: Secondary | ICD-10-CM

## 2023-04-23 DIAGNOSIS — E890 Postprocedural hypothyroidism: Secondary | ICD-10-CM | POA: Diagnosis present

## 2023-04-23 DIAGNOSIS — I442 Atrioventricular block, complete: Secondary | ICD-10-CM | POA: Diagnosis present

## 2023-04-23 DIAGNOSIS — R569 Unspecified convulsions: Secondary | ICD-10-CM | POA: Diagnosis not present

## 2023-04-23 DIAGNOSIS — K219 Gastro-esophageal reflux disease without esophagitis: Secondary | ICD-10-CM | POA: Diagnosis present

## 2023-04-23 DIAGNOSIS — J101 Influenza due to other identified influenza virus with other respiratory manifestations: Secondary | ICD-10-CM | POA: Diagnosis present

## 2023-04-23 DIAGNOSIS — N400 Enlarged prostate without lower urinary tract symptoms: Secondary | ICD-10-CM | POA: Diagnosis present

## 2023-04-23 DIAGNOSIS — F0390 Unspecified dementia without behavioral disturbance: Secondary | ICD-10-CM | POA: Diagnosis present

## 2023-04-23 DIAGNOSIS — I152 Hypertension secondary to endocrine disorders: Secondary | ICD-10-CM | POA: Diagnosis present

## 2023-04-23 DIAGNOSIS — Z7984 Long term (current) use of oral hypoglycemic drugs: Secondary | ICD-10-CM

## 2023-04-23 DIAGNOSIS — R4182 Altered mental status, unspecified: Secondary | ICD-10-CM | POA: Diagnosis present

## 2023-04-23 DIAGNOSIS — N1831 Chronic kidney disease, stage 3a: Secondary | ICD-10-CM | POA: Diagnosis present

## 2023-04-23 DIAGNOSIS — I350 Nonrheumatic aortic (valve) stenosis: Secondary | ICD-10-CM

## 2023-04-23 DIAGNOSIS — Z7902 Long term (current) use of antithrombotics/antiplatelets: Secondary | ICD-10-CM

## 2023-04-23 DIAGNOSIS — R410 Disorientation, unspecified: Principal | ICD-10-CM

## 2023-04-23 LAB — URINALYSIS, ROUTINE W REFLEX MICROSCOPIC
Bacteria, UA: NONE SEEN
Bilirubin Urine: NEGATIVE
Glucose, UA: >=500 mg/dL — AB
Ketones, ur: 5 mg/dL — AB
Leukocytes,Ua: NEGATIVE
Nitrite: NEGATIVE
Protein, ur: 100 mg/dL — AB
Specific Gravity, Urine: 1.029 (ref 1.005–1.030)
pH: 5 (ref 5.0–8.0)

## 2023-04-23 LAB — RESP PANEL BY RT-PCR (RSV, FLU A&B, COVID)  RVPGX2
Influenza A by PCR: POSITIVE — AB
Influenza B by PCR: NEGATIVE
Resp Syncytial Virus by PCR: NEGATIVE
SARS Coronavirus 2 by RT PCR: POSITIVE — AB

## 2023-04-23 LAB — CBC WITH DIFFERENTIAL/PLATELET
Abs Immature Granulocytes: 0.06 10*3/uL (ref 0.00–0.07)
Basophils Absolute: 0 10*3/uL (ref 0.0–0.1)
Basophils Relative: 0 %
Eosinophils Absolute: 0 10*3/uL (ref 0.0–0.5)
Eosinophils Relative: 0 %
HCT: 47.5 % (ref 39.0–52.0)
Hemoglobin: 15.4 g/dL (ref 13.0–17.0)
Immature Granulocytes: 1 %
Lymphocytes Relative: 7 %
Lymphs Abs: 0.6 10*3/uL — ABNORMAL LOW (ref 0.7–4.0)
MCH: 29.9 pg (ref 26.0–34.0)
MCHC: 32.4 g/dL (ref 30.0–36.0)
MCV: 92.2 fL (ref 80.0–100.0)
Monocytes Absolute: 0.7 10*3/uL (ref 0.1–1.0)
Monocytes Relative: 8 %
Neutro Abs: 7.6 10*3/uL (ref 1.7–7.7)
Neutrophils Relative %: 84 %
Platelets: 150 10*3/uL (ref 150–400)
RBC: 5.15 MIL/uL (ref 4.22–5.81)
RDW: 13.3 % (ref 11.5–15.5)
WBC: 9 10*3/uL (ref 4.0–10.5)
nRBC: 0 % (ref 0.0–0.2)

## 2023-04-23 LAB — COMPREHENSIVE METABOLIC PANEL
ALT: 22 U/L (ref 0–44)
AST: 33 U/L (ref 15–41)
Albumin: 4.1 g/dL (ref 3.5–5.0)
Alkaline Phosphatase: 64 U/L (ref 38–126)
Anion gap: 14 (ref 5–15)
BUN: 20 mg/dL (ref 8–23)
CO2: 24 mmol/L (ref 22–32)
Calcium: 9.2 mg/dL (ref 8.9–10.3)
Chloride: 98 mmol/L (ref 98–111)
Creatinine, Ser: 1.54 mg/dL — ABNORMAL HIGH (ref 0.61–1.24)
GFR, Estimated: 47 mL/min — ABNORMAL LOW (ref >60)
Glucose, Bld: 194 mg/dL — ABNORMAL HIGH (ref 70–99)
Potassium: 4.3 mmol/L (ref 3.5–5.1)
Sodium: 136 mmol/L (ref 135–145)
Total Bilirubin: 1.8 mg/dL — ABNORMAL HIGH (ref <1.2)
Total Protein: 7.7 g/dL (ref 6.5–8.1)

## 2023-04-23 LAB — CBG MONITORING, ED
Glucose-Capillary: 188 mg/dL — ABNORMAL HIGH (ref 70–99)
Glucose-Capillary: 192 mg/dL — ABNORMAL HIGH (ref 70–99)

## 2023-04-23 LAB — AMMONIA: Ammonia: 19 umol/L (ref 9–35)

## 2023-04-23 LAB — FOLATE: Folate: 13.4 ng/mL (ref >5.9)

## 2023-04-23 LAB — SEDIMENTATION RATE: Sed Rate: 15 mm/h (ref 0–16)

## 2023-04-23 LAB — VITAMIN B12: Vitamin B-12: 363 pg/mL (ref 180–914)

## 2023-04-23 LAB — GLUCOSE, CAPILLARY: Glucose-Capillary: 260 mg/dL — ABNORMAL HIGH (ref 70–99)

## 2023-04-23 LAB — C-REACTIVE PROTEIN: CRP: 9.1 mg/dL — ABNORMAL HIGH (ref <1.0)

## 2023-04-23 LAB — TSH: TSH: 5.93 u[IU]/mL — ABNORMAL HIGH (ref 0.350–4.500)

## 2023-04-23 MED ORDER — SENNOSIDES-DOCUSATE SODIUM 8.6-50 MG PO TABS: 1.0000 | ORAL_TABLET | Freq: Every evening | ORAL | Status: DC | PRN

## 2023-04-23 MED ORDER — ACETAMINOPHEN 650 MG RE SUPP: 650.0000 mg | Freq: Four times a day (QID) | RECTAL | Status: DC | PRN

## 2023-04-23 MED ORDER — CARVEDILOL 6.25 MG PO TABS
6.2500 mg | ORAL_TABLET | Freq: Two times a day (BID) | ORAL | Status: DC
  Administered 2023-04-24 – 2023-04-25 (×3): 6.25 mg via ORAL
  Filled 2023-04-23 (×3): qty 1

## 2023-04-23 MED ORDER — ONDANSETRON HCL 4 MG/2ML IJ SOLN: 4.0000 mg | Freq: Four times a day (QID) | INTRAMUSCULAR | Status: DC | PRN

## 2023-04-23 MED ORDER — ENOXAPARIN SODIUM 40 MG/0.4ML IJ SOSY
40.0000 mg | PREFILLED_SYRINGE | INTRAMUSCULAR | Status: DC
  Administered 2023-04-23 – 2023-04-24 (×2): 40 mg via SUBCUTANEOUS
  Filled 2023-04-23 (×2): qty 0.4

## 2023-04-23 MED ORDER — ACETAMINOPHEN 325 MG PO TABS
650.0000 mg | ORAL_TABLET | Freq: Four times a day (QID) | ORAL | Status: DC | PRN
  Administered 2023-04-23 – 2023-04-24 (×2): 650 mg via ORAL
  Filled 2023-04-23 (×2): qty 2

## 2023-04-23 MED ORDER — ATORVASTATIN CALCIUM 40 MG PO TABS
40.0000 mg | ORAL_TABLET | Freq: Every evening | ORAL | Status: DC
  Administered 2023-04-24: 40 mg via ORAL
  Filled 2023-04-23: qty 1

## 2023-04-23 MED ORDER — FINERENONE 10 MG PO TABS: 1.0000 | ORAL_TABLET | Freq: Every day | ORAL | Status: DC

## 2023-04-23 MED ORDER — CLOPIDOGREL BISULFATE 75 MG PO TABS
75.0000 mg | ORAL_TABLET | Freq: Every day | ORAL | Status: DC
  Administered 2023-04-23 – 2023-04-25 (×3): 75 mg via ORAL
  Filled 2023-04-23 (×3): qty 1

## 2023-04-23 MED ORDER — SODIUM CHLORIDE 0.9 % IV SOLN
INTRAVENOUS | Status: DC
  Administered 2023-04-23: 75 mL/h via INTRAVENOUS

## 2023-04-23 MED ORDER — MEMANTINE HCL 10 MG PO TABS
10.0000 mg | ORAL_TABLET | Freq: Two times a day (BID) | ORAL | Status: DC
  Administered 2023-04-23 – 2023-04-25 (×4): 10 mg via ORAL
  Filled 2023-04-23 (×4): qty 1

## 2023-04-23 MED ORDER — FAMOTIDINE 20 MG PO TABS
20.0000 mg | ORAL_TABLET | Freq: Two times a day (BID) | ORAL | Status: DC
  Administered 2023-04-23 – 2023-04-25 (×4): 20 mg via ORAL
  Filled 2023-04-23 (×4): qty 1

## 2023-04-23 MED ORDER — LEVOTHYROXINE SODIUM 75 MCG PO TABS
175.0000 ug | ORAL_TABLET | Freq: Every evening | ORAL | Status: DC
  Administered 2023-04-23 – 2023-04-24 (×2): 175 ug via ORAL
  Filled 2023-04-23 (×2): qty 1

## 2023-04-23 MED ORDER — SODIUM CHLORIDE 0.9% FLUSH
3.0000 mL | Freq: Two times a day (BID) | INTRAVENOUS | Status: DC
  Administered 2023-04-23 – 2023-04-25 (×4): 3 mL via INTRAVENOUS

## 2023-04-23 MED ORDER — SERTRALINE HCL 100 MG PO TABS
200.0000 mg | ORAL_TABLET | Freq: Every evening | ORAL | Status: DC
  Administered 2023-04-23 – 2023-04-24 (×2): 200 mg via ORAL
  Filled 2023-04-23 (×2): qty 2

## 2023-04-23 MED ORDER — INSULIN ASPART 100 UNIT/ML IJ SOLN
0.0000 [IU] | Freq: Every day | INTRAMUSCULAR | Status: DC
  Administered 2023-04-24: 5 [IU] via SUBCUTANEOUS
  Administered 2023-04-24: 3 [IU] via SUBCUTANEOUS

## 2023-04-23 MED ORDER — GABAPENTIN 100 MG PO CAPS
100.0000 mg | ORAL_CAPSULE | Freq: Every evening | ORAL | Status: DC
  Administered 2023-04-23 – 2023-04-24 (×2): 100 mg via ORAL
  Filled 2023-04-23 (×2): qty 1

## 2023-04-23 MED ORDER — VITAMIN B-12 100 MCG PO TABS
100.0000 ug | ORAL_TABLET | Freq: Every day | ORAL | Status: DC
  Filled 2023-04-23: qty 1

## 2023-04-23 MED ORDER — INSULIN ASPART 100 UNIT/ML IJ SOLN
0.0000 [IU] | Freq: Three times a day (TID) | INTRAMUSCULAR | Status: DC
  Administered 2023-04-24 (×3): 2 [IU] via SUBCUTANEOUS
  Administered 2023-04-25: 1 [IU] via SUBCUTANEOUS

## 2023-04-23 MED ORDER — TAMSULOSIN HCL 0.4 MG PO CAPS
0.4000 mg | ORAL_CAPSULE | Freq: Every evening | ORAL | Status: DC
  Administered 2023-04-23 – 2023-04-24 (×2): 0.4 mg via ORAL
  Filled 2023-04-23 (×2): qty 1

## 2023-04-23 MED ORDER — MELATONIN 5 MG PO TABS
5.0000 mg | ORAL_TABLET | Freq: Every day | ORAL | Status: DC
  Administered 2023-04-23 – 2023-04-24 (×2): 5 mg via ORAL
  Filled 2023-04-23 (×2): qty 1

## 2023-04-23 MED ORDER — ONDANSETRON HCL 4 MG PO TABS
4.0000 mg | ORAL_TABLET | Freq: Four times a day (QID) | ORAL | Status: DC | PRN
  Administered 2023-04-24: 4 mg via ORAL
  Filled 2023-04-23: qty 1

## 2023-04-23 MED ORDER — OSELTAMIVIR PHOSPHATE 30 MG PO CAPS
30.0000 mg | ORAL_CAPSULE | Freq: Two times a day (BID) | ORAL | Status: DC
  Administered 2023-04-24 – 2023-04-25 (×3): 30 mg via ORAL
  Filled 2023-04-23 (×4): qty 1

## 2023-04-23 MED ORDER — OSELTAMIVIR PHOSPHATE 75 MG PO CAPS
75.0000 mg | ORAL_CAPSULE | Freq: Once | ORAL | Status: AC
  Administered 2023-04-23: 75 mg via ORAL
  Filled 2023-04-23: qty 1

## 2023-04-23 NOTE — Hospital Course (Signed)
Tony [name] is a 71 y.o. 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

## 2023-04-23 NOTE — ED Notes (Signed)
Condom cath placed on PT to attempt to get a urine sample.

## 2023-04-23 NOTE — ED Notes (Signed)
ED TO INPATIENT HANDOFF REPORT  ED Nurse Name and Phone #:   Molli Hazard [address] Name/Age/Gender Tony [name] 72 y.o. male Room/Bed: 020C/020C  Code Status   Code Status: Full Code  Home/SNF/Other Home Patient oriented to: self, place, and situation Is this baseline? Yes   Triage Complete: Triage complete  Chief Complaint Acute metabolic encephalopathy [G93.41]  Triage Note Pt to ED via [facility] from home. Pt's family came to visit today and found patient confused "and didn't look like himself". Pt alert to self.   EMS VS  142/80 84, paced 98% RA Cbg 246  20g L hand   Allergies Allergies  Allergen Reactions   Codeine Itching   Crestor [Rosuvastatin Calcium] Other (See Comments)    Leg pain   Metformin Hcl Diarrhea   Simvastatin Other (See Comments)    Leg pain    Level of Care/Admitting Diagnosis ED Disposition     ED Disposition  Admit   Condition  --   Comment  Hospital Area: [facility] [[id number]]  Level of Care: Telemetry Medical [104]  May place patient in observation at [facility] or [facility] if equivalent level of care is available:: No  Covid Evaluation: Confirmed COVID Positive  Diagnosis: Acute metabolic encephalopathy [9604540]  Admitting Physician: Charlsie Quest [[id number]]  Attending Physician: Charlsie Quest [[id number]]          B Medical/Surgery History Past Medical History:  Diagnosis Date   Cancer (HCC)    thyroid   CHF (congestive heart failure) (HCC)    Complete heart block (HCC)    a. 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  Excell Seltzer and Dr. Laneta Simmers   Severe aortic stenosis    Sleep apnea    Past Surgical History:  Procedure Laterality Date   CARDIAC CATHETERIZATION     INTRAOPERATIVE TRANSTHORACIC ECHOCARDIOGRAM N/A 08/29/2021   Procedure: INTRAOPERATIVE TRANSTHORACIC ECHOCARDIOGRAM;  Surgeon: Tonny Bollman, MD;  Location: [facility] INVASIVE CV LAB;  Service: Open Heart Surgery;  Laterality: N/A;   LUMBAR LAMINECTOMY/DECOMPRESSION MICRODISCECTOMY  07/19/2011   Procedure: LUMBAR LAMINECTOMY/DECOMPRESSION MICRODISCECTOMY 3 LEVELS;  Surgeon: Venita Lick, MD;  Location: [facility] OR;  Service: Orthopedics;  Laterality: Left;  Lumbar three-Lumbar five LEFT DECOMPRESSION AND FORAMINOTOMY Lumbar three-four LEFT DISCECTOMY   PERMANENT PACEMAKER INSERTION N/A 08/11/2014   MDT Adapta L implanted by Dr Johney Frame for CHB   RIGHT HEART CATH AND CORONARY ANGIOGRAPHY N/A 08/07/2021   Procedure: RIGHT HEART CATH AND CORONARY ANGIOGRAPHY;  Surgeon: Tonny Bollman, MD;  Location: [facility] INVASIVE CV LAB;  Service: Cardiovascular;  Laterality: N/A;   RIGHT/LEFT HEART CATH AND CORONARY ANGIOGRAPHY N/A 08/07/2021   Procedure: RIGHT/LEFT HEART CATH AND CORONARY ANGIOGRAPHY;  Surgeon: Tonny Bollman, MD;  Location: [facility] INVASIVE CV LAB;  Service: Cardiovascular;  Laterality: N/A;   Thyroidectomy x2     TRANSCATHETER AORTIC VALVE REPLACEMENT, TRANSFEMORAL N/A 08/29/2021   Procedure: Transcatheter Aortic Valve Replacement, Transfemoral;  Surgeon: Tonny Bollman, MD;  Location: [facility] INVASIVE CV LAB;  Service: Open Heart Surgery;  Laterality: N/A;  A IV Location/Drains/Wounds Patient Lines/Drains/Airways Status     Active Line/Drains/Airways     Name Placement date Placement time Site Days   Peripheral IV 04/23/23 Left;Posterior Hand 04/23/23  1124  Hand  less than 1   Peripheral IV 04/23/23 20 G Anterior;Distal;Right;Upper Arm 04/23/23  1140  Arm  less than 1            Intake/Output Last 24 hours No intake or output data in the 24 hours  ending 04/23/23 2059  Labs/Imaging Results for orders placed or performed during the hospital encounter of 04/23/23 (from the past 48 hours)  Comprehensive metabolic panel     Status: Abnormal   Collection Time: 04/23/23 11:38 AM  Result Value Ref Range   Sodium 136 135 - 145 mmol/L   Potassium 4.3 3.5 - 5.1 mmol/L   Chloride 98 98 - 111 mmol/L   CO2 24 22 - 32 mmol/L   Glucose, Bld 194 (H) 70 - 99 mg/dL    Comment: Glucose reference range applies only to samples taken after fasting for at least 8 hours.   BUN 20 8 - 23 mg/dL   Creatinine, Ser 6.96 (H) 0.61 - 1.24 mg/dL   Calcium 9.2 8.9 - 29.5 mg/dL   Total Protein 7.7 6.5 - 8.1 g/dL   Albumin 4.1 3.5 - 5.0 g/dL   AST 33 15 - 41 U/L   ALT 22 0 - 44 U/L   Alkaline Phosphatase 64 38 - 126 U/L   Total Bilirubin 1.8 (H) <1.2 mg/dL   GFR, Estimated 47 (L) >60 mL/min    Comment: (NOTE) Calculated using the CKD-EPI Creatinine Equation (2021)    Anion gap 14 5 - 15    Comment: Performed at [facility], [address] [place], [place] [address]  CBC with Differential/Platelet     Status: Abnormal   Collection Time: 04/23/23 11:38 AM  Result Value Ref Range   WBC 9.0 4.0 - 10.5 K/uL   RBC 5.15 4.22 - 5.81 MIL/uL   Hemoglobin 15.4 13.0 - 17.0 g/dL   HCT 24.4 01.0 - 27.2 %   MCV 92.2 80.0 - 100.0 fL   MCH 29.9 26.0 - 34.0 pg   MCHC 32.4 30.0 - 36.0 g/dL   RDW 53.6 64.4 - 03.4 %   Platelets 150 150 - 400 K/uL   nRBC 0.0 0.0 - 0.2 %   Neutrophils Relative % 84 %   Neutro Abs 7.6 1.7 - 7.7 K/uL   Lymphocytes Relative 7 %   Lymphs Abs 0.6 (L) 0.7 - 4.0 K/uL   Monocytes Relative 8 %   Monocytes Absolute 0.7 0.1 - 1.0 K/uL   Eosinophils Relative 0 %   Eosinophils Absolute 0.0 0.0 - 0.5 K/uL   Basophils Relative 0 %   Basophils Absolute 0.0 0.0 - 0.1 K/uL   Immature Granulocytes 1 %   Abs Immature Granulocytes 0.06 0.00 - 0.07 K/uL    Comment: Performed at [facility], [address] [place], [place] [address]   Urinalysis, Routine w reflex microscopic -Urine, Clean Catch     Status: Abnormal   Collection Time: 04/23/23 11:38 AM  Result Value Ref Range   Color, Urine YELLOW YELLOW   APPearance CLEAR CLEAR   Specific Gravity, Urine 1.029 1.005 - 1.030   pH 5.0 5.0 - 8.0   Glucose, UA >=500 (A) NEGATIVE mg/dL   Hgb urine dipstick SMALL (A) NEGATIVE   Bilirubin Urine NEGATIVE NEGATIVE  Ketones, ur 5 (A) NEGATIVE mg/dL   Protein, ur 098 (A) NEGATIVE mg/dL   Nitrite NEGATIVE NEGATIVE   Leukocytes,Ua NEGATIVE NEGATIVE   RBC / HPF 0-5 0 - 5 RBC/hpf   WBC, UA 0-5 0 - 5 WBC/hpf   Bacteria, UA NONE SEEN NONE SEEN   Squamous Epithelial / HPF 0-5 0 - 5 /HPF    Comment: Performed at [facility], [address] [place], [place] [address]  CBG monitoring, ED     Status: Abnormal   Collection Time: 04/23/23 11:43 AM  Result Value Ref Range   Glucose-Capillary 188 (H) 70 - 99 mg/dL    Comment: Glucose reference range applies only to samples taken after fasting for at least 8 hours.  Resp panel by RT-PCR (RSV, Flu A&B, Covid) Anterior Nasal Swab     Status: Abnormal   Collection Time: 04/23/23  3:33 PM   Specimen: Anterior Nasal Swab  Result Value Ref Range   SARS Coronavirus 2 by RT PCR POSITIVE (A) NEGATIVE   Influenza A by PCR POSITIVE (A) NEGATIVE   Influenza B by PCR NEGATIVE NEGATIVE    Comment: (NOTE) The Xpert Xpress SARS-CoV-2/FLU/RSV plus assay is intended as an aid in the diagnosis of influenza from Nasopharyngeal swab specimens and should not be used as a sole basis for treatment. Nasal washings and aspirates are unacceptable for Xpert Xpress SARS-CoV-2/FLU/RSV testing.  Fact Sheet for Patients: BloggerCourse.com  Fact Sheet for Healthcare Providers: SeriousBroker.it  This test is not yet approved or cleared by the [place] FDA and has been authorized for detection and/or diagnosis of SARS-CoV-2 by FDA under an Emergency  Use Authorization (EUA). This EUA will remain in effect (meaning this test can be used) for the duration of the COVID-19 declaration under Section 564(b)(1) of the Act, 21 U.S.C. section 360bbb-3(b)(1), unless the authorization is terminated or revoked.     Resp Syncytial Virus by PCR NEGATIVE NEGATIVE    Comment: (NOTE) Fact Sheet for Patients: BloggerCourse.com  Fact Sheet for Healthcare Providers: SeriousBroker.it  This test is not yet approved or cleared by the [place] FDA and has been authorized for detection and/or diagnosis of SARS-CoV-2 by FDA under an Emergency Use Authorization (EUA). This EUA will remain in effect (meaning this test can be used) for the duration of the COVID-19 declaration under Section 564(b)(1) of the Act, 21 U.S.C. section 360bbb-3(b)(1), unless the authorization is terminated or revoked.  Performed at [facility], [address], [place], [place] [address]   CBG monitoring, ED     Status: Abnormal   Collection Time: 04/23/23  4:03 PM  Result Value Ref Range   Glucose-Capillary 192 (H) 70 - 99 mg/dL    Comment: Glucose reference range applies only to samples taken after fasting for at least 8 hours.  TSH     Status: Abnormal   Collection Time: 04/23/23  4:31 PM  Result Value Ref Range   TSH 5.930 (H) 0.350 - 4.500 uIU/mL    Comment: Performed by a 3rd Generation assay with a functional sensitivity of <=0.01 uIU/mL. Performed at [facility], [address], [place], [place] [address]   Folate     Status: None   Collection Time: 04/23/23  4:31 PM  Result Value Ref Range   Folate 13.4 >5.9 ng/mL    Comment: Performed at [facility], [address] [place], [place] [address]  Vitamin B12     Status: None  Collection Time: 04/23/23  4:31 PM  Result Value Ref Range   Vitamin B-12 363 180 - 914 pg/mL    Comment: (NOTE) This assay is not validated for testing neonatal  or myeloproliferative syndrome specimens for Vitamin B12 levels. Performed at [facility], [address], [place], [place] [address]   C-reactive protein     Status: Abnormal   Collection Time: 04/23/23  4:31 PM  Result Value Ref Range   CRP 9.1 (H) <1.0 mg/dL    Comment: Performed at [facility], [address], [place], [place] [address]  Sedimentation rate     Status: None   Collection Time: 04/23/23  4:31 PM  Result Value Ref Range   Sed Rate 15 0 - 16 mm/hr    Comment: Performed at [facility], [address], [place], [place] [address]   CT HEAD WO CONTRAST Result Date: 04/23/2023 CLINICAL DATA:  Mental status change of unknown cause EXAM: CT HEAD WITHOUT CONTRAST TECHNIQUE: Contiguous axial images were obtained from the base of the skull through the vertex without intravenous contrast. RADIATION DOSE REDUCTION: This exam was performed according to the departmental dose-optimization program which includes automated exposure control, adjustment of the mA and/or kV according to patient size and/or use of iterative reconstruction technique. COMPARISON:  01/22/2023 FINDINGS: Brain: Age related volume loss. No focal abnormality affects the brainstem or cerebellum. Cerebral hemispheres show chronic small-vessel change of the white matter but no sign of acute or subacute infarction, mass lesion, hemorrhage, hydrocephalus or extra-axial collection. Ventricles are prominent, unchanged, inconsistent with central atrophy. Vascular: There is atherosclerotic calcification of the major vessels at the base of the brain. Skull: Negative Sinuses/Orbits: Clear/normal Other: None IMPRESSION: No acute CT finding. Age related volume loss. Chronic small-vessel change of the white matter. Ventricles are prominent, unchanged, inconsistent with central atrophy. Electronically Signed   By: Paulina Fusi M.D.   On: 04/23/2023 12:46   DG Chest Port 1 View Result Date: 04/23/2023 CLINICAL DATA:   Loss of consciousness. EXAM: PORTABLE CHEST 1 VIEW COMPARISON:  01/23/2023. FINDINGS: Low lung volume. There are probable atelectatic changes at the lung bases. There is apparent blunting of left lateral costophrenic angle which may be due to trace pleural effusion versus superimposed soft tissue. Bilateral lung fields are otherwise clear. Right lateral costophrenic angle is clear. Stable cardio-mediastinal silhouette. There is a left sided 2-lead pacemaker. Prosthetic aortic valve seen. No acute osseous abnormalities. The soft tissues are within normal limits. IMPRESSION: *Apparent blunting of left lateral costophrenic angle may be due to trace pleural effusion versus superimposed soft tissue. *Bilateral lung fields are otherwise clear. No dense consolidation or lung collapse. Electronically Signed   By: Jules Schick M.D.   On: 04/23/2023 12:27    Pending Labs Unresulted Labs (From admission, onward)     Start     Ordered   04/24/23 0500  CBC  Tomorrow morning,   R        04/23/23 1919   04/24/23 0500  Basic metabolic panel  Tomorrow morning,   R        04/23/23 1919   04/23/23 1642  Vitamin B6  Once,   URGENT        04/23/23 1641   04/23/23 1621  RPR  Once,   URGENT        04/23/23 1621   04/23/23 1621  Vitamin B1  Once,   URGENT        04/23/23  1621   04/23/23 1621  Ammonia  Once,   STAT        04/23/23 1621            Vitals/Pain Today's Vitals   04/23/23 1900 04/23/23 1915 04/23/23 1936 04/23/23 2052  BP: 133/81 (!) 149/95    Pulse: 81 87    Resp: (!) 28 20    Temp:   (!) 102 F (38.9 C)   TempSrc:      SpO2: 94% 96%    Weight:      Height:      PainSc:    3     Isolation Precautions No active isolations  Medications Medications  enoxaparin (LOVENOX) injection 40 mg (40 mg Subcutaneous Given 04/23/23 1946)  sodium chloride flush (NS) 0.9 % injection 3 mL (has no administration in time range)  acetaminophen (TYLENOL) tablet 650 mg (650 mg Oral Given 04/23/23  2005)    Or  acetaminophen (TYLENOL) suppository 650 mg ( Rectal See Alternative 04/23/23 2005)  0.9 %  sodium chloride infusion (75 mL/hr Intravenous New Bag/Given 04/23/23 2001)  ondansetron (ZOFRAN) tablet 4 mg (has no administration in time range)    Or  ondansetron (ZOFRAN) injection 4 mg (has no administration in time range)  senna-docusate (Senokot-S) tablet 1 tablet (has no administration in time range)  oseltamivir (TAMIFLU) capsule 75 mg (75 mg Oral Given 04/23/23 2021)    Followed by  oseltamivir (TAMIFLU) capsule 30 mg (has no administration in time range)  atorvastatin (LIPITOR) tablet 40 mg (has no administration in time range)  carvedilol (COREG) tablet 6.25 mg (has no administration in time range)  clopidogrel (PLAVIX) tablet 75 mg (75 mg Oral Given 04/23/23 1946)  famotidine (PEPCID) tablet 20 mg (has no administration in time range)  levothyroxine (SYNTHROID) tablet 175 mcg (175 mcg Oral Given 04/23/23 2033)  gabapentin (NEURONTIN) capsule 100 mg (100 mg Oral Given 04/23/23 1946)  melatonin tablet 5 mg (has no administration in time range)  memantine (NAMENDA) tablet 10 mg (has no administration in time range)  sertraline (ZOLOFT) tablet 200 mg (200 mg Oral Given 04/23/23 2033)  tamsulosin (FLOMAX) capsule 0.4 mg (0.4 mg Oral Given 04/23/23 2033)  vitamin B-12 (CYANOCOBALAMIN) tablet 100 mcg (has no administration in time range)  Finerenone TABS 10 mg (has no administration in time range)  insulin aspart (novoLOG) injection 0-9 Units (has no administration in time range)  insulin aspart (novoLOG) injection 0-5 Units (has no administration in time range)    Mobility walks     Focused Assessments Cardiac Assessment Handoff:  Cardiac Rhythm: Ventricular paced No results found for: "CKTOTAL", "CKMB", "CKMBINDEX", "TROPONINI" No results found for: "DDIMER" Does the Patient currently have chest pain?   , Neuro Assessment Handoff:  Swallow screen pass? Yes   Cardiac Rhythm: Ventricular paced       Neuro Assessment: Within Defined Limits Neuro Checks:      Has TPA been given?  If patient is a Neuro Trauma and patient is going to OR before floor call report to 4N Charge nurse: [phone] or [phone]  , Renal Assessment Handoff:  Hemodialysis Schedule:  Last Hemodialysis date and time:    Restricted appendage:   , Pulmonary Assessment Handoff:  Lung sounds:   O2 Device: Room Air      R Recommendations: See Admitting Provider Note  Report given to:   Additional Notes:   Pt has been very pleasant and kind. Pt has been cooperative with periods of confusion. Hx of  Alz/dementia and is normally ambulatory at an independent living. Noted an increase in Temp from 99-102. Meds/fluids have been given. Pt has been cleaned up, placed in brief with clean linens.

## 2023-04-23 NOTE — ED Provider Notes (Signed)
[place] EMERGENCY DEPARTMENT AT [facility] Provider Note   CSN: [id number] Arrival date & time: 04/23/23  1117     History  Chief Complaint  Patient presents with   Altered Mental Status    Tony [name] is a 72 y.o. male.  HPI Patient presents via EMS after family noticed today he did not look like himself.  The patient himself states that he feels more tired than usual cannot specify any other abnormalities.  Level 5 caveat secondary to confusion.  Per EMS family reported that the patient did not look normal, and we sent here for evaluation.  EMS reports the patient was mildly hyperglycemic, with paced rhythm in transport otherwise unremarkable.    Home Medications Prior to Admission medications   Medication Sig Start Date End Date Taking? Authorizing Provider  atorvastatin (LIPITOR) 40 MG tablet Take 40 mg by mouth daily. 07/06/21   [provider]  canagliflozin (INVOKANA) 300 MG TABS tablet Take 300 mg by mouth daily before breakfast. 07/07/21   [provider]  carvedilol (COREG) 3.125 MG tablet Take 1 tablet (3.125 mg total) by mouth 2 (two) times daily with a meal. 10/11/21   Sheilah Pigeon, PA-C  clopidogrel (PLAVIX) 75 MG tablet Take 1 tablet (75 mg total) by mouth daily. 05/31/22   Levert Feinstein, MD  clopidogrel (PLAVIX) 75 MG tablet Take 1 tablet (75 mg total) by mouth daily. 01/24/23 01/24/24  Carollee Herter, DO  famotidine (PEPCID) 20 MG tablet Take 1 tablet (20 mg total) by mouth daily. 01/25/23   Carollee Herter, DO  Finerenone (KERENDIA) 10 MG TABS Take 1 tablet (10 mg total) by mouth daily. 01/24/23   Carollee Herter, DO  gabapentin (NEURONTIN) 100 MG capsule Take 1 capsule (100 mg total) by mouth at bedtime. 01/24/23 04/24/23  Carollee Herter, DO  insulin glargine (LANTUS SOLOSTAR) 100 UNIT/ML Solostar Pen Inject 30 Units into the skin 2 (two) times daily. 01/26/21   [provider]  Lancets (ONETOUCH DELICA PLUS [place]) MISC Apply topically.  02/20/21   [provider]  levothyroxine (SYNTHROID) 175 MCG tablet Take 1 tablet (175 mcg total) by mouth at bedtime. 01/24/23   Carollee Herter, DO  lidocaine (LIDODERM) 5 % Place 1 patch onto the skin daily. Remove & Discard patch within 12 hours or as directed by MD 01/24/23   Carollee Herter, DO  melatonin 10 MG TABS Take 10 mg by mouth at bedtime. 01/24/23   Carollee Herter, DO  memantine (NAMENDA) 10 MG tablet Take 1 tablet (10 mg total) by mouth 2 (two) times daily. Patient taking differently: Take 10 mg by mouth See admin instructions. Take 10 mg by mouth one to two times a day 12/31/22   Ihor Austin, NP  polyethylene glycol (MIRALAX / GLYCOLAX) 17 g packet Take 17 g by mouth daily as needed. 01/24/23   Carollee Herter, DO  PRESCRIPTION MEDICATION 1 Device See admin instructions. CPAP- At bedtime and during naps    [provider]  sacubitril-valsartan (ENTRESTO) 24-26 MG Take 1 tablet by mouth daily. 08/22/22   Georgie Chard D, NP  sertraline (ZOLOFT) 100 MG tablet Take 2 tablets (200 mg total) by mouth at bedtime. 01/24/23   Carollee Herter, DO  Tamsulosin HCl (FLOMAX) 0.4 MG CAPS Take 1 capsule (0.4 mg total) by mouth every morning. 07/21/11   Norval Gable, PA-C  vitamin B-12 (CYANOCOBALAMIN) 100 MCG tablet Take 1 tablet (100 mcg total) by mouth daily. 01/24/23 01/24/24  Carollee Herter, DO  Allergies    Codeine, Crestor [rosuvastatin calcium], Metformin hcl, and Simvastatin    Review of Systems   Review of Systems  Physical Exam Updated Vital Signs BP 133/67 (BP Location: Right Arm)   Pulse 79   Temp 99.7 F (37.6 C) (Oral)   Resp 20   Ht 6' (1.829 m)   Wt 112 kg   SpO2 95%   BMI 33.49 kg/m  Physical Exam Vitals and nursing note reviewed.  Constitutional:      General: He is not in acute distress.    Appearance: He is well-developed.  HENT:     Head: Normocephalic and atraumatic.  Eyes:     Conjunctiva/sclera: Conjunctivae normal.  Cardiovascular:     Rate and Rhythm: Normal  rate and regular rhythm.  Pulmonary:     Effort: Pulmonary effort is normal. No respiratory distress.     Breath sounds: No stridor.  Abdominal:     General: There is no distension.  Skin:    General: Skin is warm and dry.  Neurological:     Mental Status: He is alert.     Motor: No tremor or abnormal muscle tone.     Comments: Patient slow to respond to questions, offers very brief, stammering responses.  Psychiatric:        Cognition and Memory: Cognition is impaired. Memory is impaired.     ED Results / Procedures / Treatments   Labs (all labs ordered are listed, but only abnormal results are displayed) Labs Reviewed  COMPREHENSIVE METABOLIC PANEL - Abnormal; Notable for the following components:      Result Value   Glucose, Bld 194 (*)    Creatinine, Ser 1.54 (*)    Total Bilirubin 1.8 (*)    GFR, Estimated 47 (*)    All other components within normal limits  CBC WITH DIFFERENTIAL/PLATELET - Abnormal; Notable for the following components:   Lymphs Abs 0.6 (*)    All other components within normal limits  URINALYSIS, ROUTINE W REFLEX MICROSCOPIC - Abnormal; Notable for the following components:   Glucose, UA >=500 (*)    Hgb urine dipstick SMALL (*)    Ketones, ur 5 (*)    Protein, ur 100 (*)    All other components within normal limits  CBG MONITORING, ED - Abnormal; Notable for the following components:   Glucose-Capillary 188 (*)    All other components within normal limits  RESP PANEL BY RT-PCR (RSV, FLU A&B, COVID)  [id number]    EKG EKG Interpretation Date/Time:  Tuesday April 23 2023 11:28:26 EST Ventricular Rate:  80 PR Interval:  194 QRS Duration:  203 QT Interval:  472 QTC Calculation: 545 R Axis:   -77  Text Interpretation: VENTRICULAR PACED RHYTHM Confirmed by Gerhard Munch [phone]) on 04/23/2023 11:44:58 AM  Radiology CT HEAD WO CONTRAST Result Date: 04/23/2023 CLINICAL DATA:  Mental status change of unknown cause EXAM: CT HEAD WITHOUT  CONTRAST TECHNIQUE: Contiguous axial images were obtained from the base of the skull through the vertex without intravenous contrast. RADIATION DOSE REDUCTION: This exam was performed according to the departmental dose-optimization program which includes automated exposure control, adjustment of the mA and/or kV according to patient size and/or use of iterative reconstruction technique. COMPARISON:  01/22/2023 FINDINGS: Brain: Age related volume loss. No focal abnormality affects the brainstem or cerebellum. Cerebral hemispheres show chronic small-vessel change of the white matter but no sign of acute or subacute infarction, mass lesion, hemorrhage, hydrocephalus or extra-axial collection. Ventricles are prominent,  unchanged, inconsistent with central atrophy. Vascular: There is atherosclerotic calcification of the major vessels at the base of the brain. Skull: Negative Sinuses/Orbits: Clear/normal Other: None IMPRESSION: No acute CT finding. Age related volume loss. Chronic small-vessel change of the white matter. Ventricles are prominent, unchanged, inconsistent with central atrophy. Electronically Signed   By: Paulina Fusi M.D.   On: 04/23/2023 12:46   DG Chest Port 1 View Result Date: 04/23/2023 CLINICAL DATA:  Loss of consciousness. EXAM: PORTABLE CHEST 1 VIEW COMPARISON:  01/23/2023. FINDINGS: Low lung volume. There are probable atelectatic changes at the lung bases. There is apparent blunting of left lateral costophrenic angle which may be due to trace pleural effusion versus superimposed soft tissue. Bilateral lung fields are otherwise clear. Right lateral costophrenic angle is clear. Stable cardio-mediastinal silhouette. There is a left sided 2-lead pacemaker. Prosthetic aortic valve seen. No acute osseous abnormalities. The soft tissues are within normal limits. IMPRESSION: *Apparent blunting of left lateral costophrenic angle may be due to trace pleural effusion versus superimposed soft tissue.  *Bilateral lung fields are otherwise clear. No dense consolidation or lung collapse. Electronically Signed   By: Jules Schick M.D.   On: 04/23/2023 12:27    Procedures Procedures    Medications Ordered in ED Medications - No data to display  ED Course/ Medical Decision Making/ A&P                                 Medical Decision Making Elderly male presents with confusion, weakness, from home.  Unclear last seen normal, no focal deficits reassuring somewhat for concern of possible stroke.  Concern for encephalopathy, infection, dehydration, labs sent CT x-ray ordered. Cardiac 80 sinus normal Pulse ox 100% room air normal   Amount and/or Complexity of Data Reviewed Independent Historian: EMS External Data Reviewed: notes. Labs: ordered. Decision-making details documented in ED Course. Radiology: ordered and independent interpretation performed. Decision-making details documented in ED Course. ECG/medicine tests: ordered and independent interpretation performed. Decision-making details documented in ED Course.   2:48 PM Patient accompanied by his daughter who is a critical care physician.  She describes substantial change in behavior consistent with delirium.  Patient has known early dementia, but today's episodes of behavior have been notably abnormal.  Patient's initial findings discussed with the daughter, including head CT without notable changes.  Patient is ineligible for MRI secondary to his pacemaker.  Labs pending, including COVID.  Daughter notes that the patient has had some episodes of incontinence over the past few months, another today.  Patient himself is now slightly more awake, and additional consideration is seizure, with concern for seizure versus stroke, with altered mental status in patient with known dementia patient will require admission.  Neurology has been paged to assist as a consulting service.        Final Clinical Impression(s) / ED Diagnoses Final  diagnoses:  Delirium     Gerhard Munch, MD 04/23/23 1528

## 2023-04-23 NOTE — ED Triage Notes (Signed)
Pt to ED via [facility] from home. Pt's family came to visit today and found patient confused "and didn't look like himself". Pt alert to self.   EMS VS  142/80 84, paced 98% RA Cbg 246  20g L hand

## 2023-04-23 NOTE — ED Notes (Signed)
PTS pacemaker not safe for MRI per CT. Reported to Jeraldine Loots MD

## 2023-04-23 NOTE — Consult Note (Addendum)
NEUROLOGY CONSULT NOTE   Date of service: April 23, 2023 Patient Name: Tony [name] MRN:  [id number] DOB:  04/13/1951 Chief Complaint: "Altered mental status" Requesting Provider: Linwood Dibbles, MD  History of Present Illness  Tony [name] is a 72 y.o. 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  Patient also has a history of stroke but no seizure history.  Patient presents today after being found with altered mental status by his daughter.  At baseline, patient is able to perform all ADLs but does have some memory impairment, occasionally missing doses of medications and such.  MoCA was 19/30 at last neurology appointment in August.  Patient's daughter states that she had a normal conversation with him yesterday and that she was called by his independent living facility this morning because he had not come to breakfast.  Patient's daughter came to see him to find him in bed without clothes on after having been incontinent of urine.  Patient is afebrile, and urinalysis was negative for UTI.  COVID infections have been going around his facility, and respiratory panel reveals him to be Covid positive.    ROS   Unable to ascertain due to altered mental status  Past History   Past Medical History:  Diagnosis Date   Cancer (HCC)    thyroid   CHF (congestive heart failure) (HCC)    Complete heart block (HCC)    a. s/p MDT dual chamber PPM followed by Dr Johney Frame    Complication of anesthesia    1985 after Thyriodectomy hard time waking up   Diabetes mellitus    GERD (gastroesophageal reflux disease)    Hypertension    Hypothyroidism    Had two surgeries for  Cancer   Presence of permanent cardiac pacemaker    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   s/p TAVR with a 29 mm Edwards S3UR via the TF approach by Dr. Excell Seltzer and Dr. Laneta Simmers   Severe aortic stenosis    Sleep apnea     Past Surgical History:  Procedure Laterality Date   CARDIAC CATHETERIZATION     INTRAOPERATIVE TRANSTHORACIC ECHOCARDIOGRAM N/A 08/29/2021   Procedure: INTRAOPERATIVE TRANSTHORACIC ECHOCARDIOGRAM;  Surgeon: Tonny Bollman, MD;  Location: [facility] INVASIVE CV LAB;  Service: Open Heart Surgery;  Laterality: N/A;   LUMBAR LAMINECTOMY/DECOMPRESSION MICRODISCECTOMY  07/19/2011   Procedure: LUMBAR LAMINECTOMY/DECOMPRESSION MICRODISCECTOMY 3 LEVELS;  Surgeon: Venita Lick, MD;  Location: [facility] OR;  Service: Orthopedics;  Laterality: Left;  Lumbar three-Lumbar five LEFT DECOMPRESSION AND FORAMINOTOMY Lumbar three-four LEFT DISCECTOMY   PERMANENT PACEMAKER INSERTION N/A 08/11/2014   MDT Adapta L implanted by Dr Johney Frame for CHB   RIGHT HEART CATH AND CORONARY ANGIOGRAPHY N/A 08/07/2021   Procedure: RIGHT HEART CATH AND CORONARY ANGIOGRAPHY;  Surgeon: Tonny Bollman, MD;  Location: [facility] INVASIVE CV LAB;  Service: Cardiovascular;  Laterality: N/A;   RIGHT/LEFT HEART CATH AND CORONARY ANGIOGRAPHY N/A 08/07/2021   Procedure: RIGHT/LEFT HEART CATH AND CORONARY ANGIOGRAPHY;  Surgeon: Tonny Bollman, MD;  Location: [facility] INVASIVE CV LAB;  Service: Cardiovascular;  Laterality: N/A;   Thyroidectomy x2     TRANSCATHETER AORTIC VALVE REPLACEMENT, TRANSFEMORAL N/A 08/29/2021   Procedure: Transcatheter Aortic Valve Replacement, Transfemoral;  Surgeon: Tonny Bollman, MD;  Location: [facility] INVASIVE CV LAB;  Service: Open Heart Surgery;  Laterality: N/A;    Family History: Family History  Problem Relation Age of Onset   Lung cancer Mother    Heart disease Father    Heart attack Father    Healthy Sister    Healthy Daughter    Healthy Son    Anesthesia problems Neg Hx    Hypotension  Neg Hx    Pseudochol deficiency Neg Hx     Social History  reports that he has never smoked. He has never used smokeless tobacco. He reports that he does not drink alcohol and does not use drugs.  Allergies  Allergen Reactions   Codeine Itching   Crestor [Rosuvastatin Calcium] Other (See Comments)    Leg pain   Metformin Hcl Diarrhea   Simvastatin Other (See Comments)    Leg pain    Medications  No current facility-administered medications for this encounter.  Current Outpatient Medications:    atorvastatin (LIPITOR) 40 MG tablet, Take 40 mg by mouth every evening., Disp: , Rfl:    canagliflozin (INVOKANA) 300 MG TABS tablet, Take 300 mg by mouth every evening., Disp: , Rfl:    carvedilol (COREG) 6.25 MG tablet, Take 6.25 mg by mouth 2 (two) times daily with a meal., Disp: , Rfl:    clopidogrel (PLAVIX) 75 MG tablet, Take 1 tablet (75 mg total) by mouth daily., Disp: 90 tablet, Rfl: 3   clopidogrel (PLAVIX) 75 MG tablet, Take 1 tablet (75 mg total) by mouth daily. (Patient taking differently: Take 75 mg by mouth every evening.), Disp: , Rfl:    famotidine (PEPCID) 20 MG tablet, Take 1 tablet (20 mg total) by mouth daily. (Patient taking differently: Take 20 mg by mouth 2 (two) times daily.), Disp: , Rfl:    Finerenone (KERENDIA) 10 MG TABS, Take 1 tablet (10 mg total) by mouth daily., Disp: , Rfl:    gabapentin (NEURONTIN) 100 MG capsule, Take 1 capsule (100 mg total) by mouth at bedtime. (Patient taking differently: Take 100 mg by mouth every evening.), Disp: 90 capsule, Rfl: 0   levothyroxine (SYNTHROID) 175 MCG tablet, Take 1 tablet (175 mcg total) by mouth at bedtime. (Patient taking differently: Take 175 mcg by mouth every evening.), Disp: , Rfl:    melatonin 10 MG TABS, Take 10 mg by mouth at bedtime. (Patient taking differently: Take 5 mg by mouth at bedtime.), Disp: , Rfl:    memantine (NAMENDA) 10 MG tablet, Take 1 tablet (10 mg total) by mouth 2 (two) times daily., Disp: 60  tablet, Rfl: 11   sacubitril-valsartan (ENTRESTO) 24-26 MG, Take 1 tablet by mouth daily. (Patient taking differently: Take 1 tablet by mouth every evening.), Disp: 60 tablet, Rfl: 0   sertraline (ZOLOFT) 100 MG tablet, Take 2 tablets (200 mg total) by mouth at bedtime. (Patient taking differently: Take 200 mg by mouth every evening.), Disp: , Rfl:    Tamsulosin HCl (FLOMAX) 0.4 MG CAPS, Take 1 capsule (0.4 mg total) by mouth every morning. (Patient taking differently: Take 0.4 mg by mouth every evening.), Disp: 30 capsule, Rfl: 0   vitamin B-12 (CYANOCOBALAMIN) 100 MCG tablet, Take 1 tablet (100 mcg total) by mouth daily., Disp: , Rfl:    carvedilol (COREG) 3.125 MG tablet, Take 1 tablet (3.125 mg total) by mouth 2 (two) times daily with a meal. (Patient not taking: Reported on 04/23/2023), Disp: 180 tablet, Rfl: 2   lidocaine (LIDODERM) 5 %, Place 1 patch onto the skin daily. Remove & Discard patch  within 12 hours or as directed by MD (Patient not taking: Reported on 05-22-2023), Disp: 30 patch, Rfl: 0  Vitals   Vitals:   05/22/23 1121 2023-05-22 1123 05/22/23 1445 2023-05-22 1537  BP:  133/67 (!) 154/134 124/72  Pulse:  79 82 84  Resp:  20 (!) 23 (!) 22  Temp:  99.7 F (37.6 C)  99 F (37.2 C)  TempSrc:  Oral  Oral  SpO2:  95% 96% 95%  Weight: 112 kg     Height: 6' (1.829 m)       Body mass index is 33.49 kg/m.  Physical Exam   Constitutional: Well-developed, well-nourished elderly patient in no acute distress Psych: Affect appropriate to situation.  Eyes: No scleral injection.  HENT: No OP obstruction.  Head: Normocephalic.  Cardiovascular: Normal rate and regular, paced rhythm.  Respiratory: Effort normal, non-labored breathing.  Skin: WDI.   Neurologic Examination    NEURO:  Mental Status: Alert and oriented to person and place but disoriented to time and situation.  Able to say that he was brought to the hospital earlier today but could not remember events of earlier this  morning.  3 out of 3 registration with repeated prompting, 0 out of 3 recall, unable to name any animals with 4 feet when asked.  Able to follow single step commands consistently but unable to follow two-step commands Speech/Language: speech is without dysarthria or aphasia.  Naming is intact, although speech is sparse and he often loses his train of thought requiring frequent redirection. Cranial Nerves:  II: Right pupil 4 mm, left pupil 3 mm, bilaterally reactive. Visual fields intact with no extinction to DSS. Decreased visual acuity on the right.  III, IV, VI: EOMI. Mild right ptosis.   V: Sensation is intact to light touch and symmetrical to face.  VII: Subtle left facial droop VIII: hearing intact to voice. IX, X: Phonation is normal.  UJ:[id number] shrug 5/5. XII: tongue is midline without fasciculations. Motor: Able to move all 4 extremities to command with good antigravity strength Tone: is normal and bulk is normal Sensation- Intact to light touch bilaterally.  Coordination: Unable to perform FNF due to difficulty following 2-step commands.  Gait- deferred   Labs/Imaging/Neurodiagnostic studies   CBC:  Recent Labs  Lab 05-22-2023 1138  WBC 9.0  NEUTROABS 7.6  HGB 15.4  HCT 47.5  MCV 92.2  PLT 150   Basic Metabolic Panel:  Lab Results  Component Value Date   NA 136 2023-05-22   K 4.3 May 22, 2023   CO2 24 22-May-2023   GLUCOSE 194 (H) 05/22/23   BUN 20 22-May-2023   CREATININE 1.54 (H) 2023-05-22   CALCIUM 9.2 05/22/2023   GFRNONAA 47 (L) May 22, 2023   GFRAA 80 12/17/2019   Lipid Panel:  Lab Results  Component Value Date   LDLCALC 75 03/23/2022   HgbA1c:  Lab Results  Component Value Date   HGBA1C 8.4 (H) 01/23/2023   Urine Drug Screen:     Component Value Date/Time   LABOPIA NONE DETECTED 03/22/2022 2116   COCAINSCRNUR NONE DETECTED 03/22/2022 2116   LABBENZ NONE DETECTED 03/22/2022 2116   AMPHETMU NONE DETECTED 03/22/2022 2116   THCU NONE DETECTED  03/22/2022 2116   LABBARB NONE DETECTED 03/22/2022 2116    Alcohol Level     Component Value Date/Time   ETH <10 03/22/2022 1839   INR  Lab Results  Component Value Date   INR 1.0 03/22/2022   APTT  Lab Results  Component Value  Date   APTT 26 03/22/2022   TSH pending RPR pending Thiamine pending Folate pending B6 pending B12 pending ESR pending CRP pending Ammonia pending  CT Head without contrast(Personally reviewed): No acute findings. Atrophy in an anteromedial frontal and bilateral anterior temporal distribution, in addition to bilateral hippocampal atrophy is noted. Atrophy subtly worse on right side compared to the left.  MRI Brain: Unable to perform due to pacemaker  Neurodiagnostics rEEG:  Pending  ASSESSMENT   Tony [name] is a 72 y.o. 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  Patient presents with altered mental status.  According to patient's daughter, she had a normal conversation with him yesterday.  This morning, he missed breakfast at his facility and daughter arrived to find him in bed without close on after having been incontinent of urine.  Patient was unable to follow simple commands to put on clothing and appeared to be very disoriented.  Urinalysis was negative for UTI.  - Exam reveals cognitive impairment with poor attention, poor memory, poor insight, but with intact naming and comprehension of simple commands. Often requires redirection and loses his train of though frequently.  - Covid test has come back positive. Also positive for influenza A by PCR.  - DDx: - Delirium related to infection could explain patient's  presentation, with Covid infection a possible precipitant. However, he is afebrile without leukocytosis.   - Given history of stroke and cerebral atrophy due to dementia, with current and prior episodes of abrupt worsening of cognition, subclinical seizures are also a concern.   - Cognitive fluctuations in the setting of possible dementia is a consideration.  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 Alzheimer's dementia also possible. Doubt Lewy body dementia as he has no symptoms of hallucinations or movements during sleep that would suggest REM sleep behavior disorder. Also with no Parkinsonian symptoms at home per daughter, including no rest tremor, rigidity or shuffling gait.  - No meningismus, fever or leukocytosis to suggest a meningitis.  - Will obtain EEG to rule out subclinical seizure activity causing altered mental status.   - Patient had another presentation recently for altered mental status, which was different than this one per daughter, after he had gotten an incorrect dose of his thyroid replacement medication.   - Will check TSH, RPR, thiamine, folate, B6, B12, ESR and CRP to see if nutritional deficiency, inflammatory state or thyroid abnormality could be contributing to his altered mental status  RECOMMENDATIONS  -Routine EEG to rule out subclinical seizure activity -Altered mental status labs: TSH, RPR, thiamine, folate, B6, B12, ESR, CRP and ammonia -Neurology will continue to follow ______________________________________________________________________  Patient seen by NP and then by MD Signed, Cortney E Ernestina Columbia, NP [facility]   I have seen and examined the patient. I have formulated the assessment and recommendations. 72 year old male with a gradual history of cognitive decline spanning 10 years per daughter, now more recently presenting with acute changes in his cognition followed by spontaneous recovery. Exam  reveals cognitive impairment with poor attention, poor memory, poor insight, but with intact naming and comprehension of simple commands. Often requires redirection and loses his train of though frequently. Recommendations as above.  Electronically signed: Dr.  Caryl Pina

## 2023-04-23 NOTE — ED Provider Notes (Signed)
Patient initially seen by Dr. Jeraldine Loots for acute confusion, delirium.  Please see his note.  Patient's laboratory tests are notable for positive COVID and influenza.  Suspect this may be the main contributing factor to his change in mental status.    Will consult with medical service for admission  Case discussed with Dr Phylliss Blakes, MD 04/23/23 [id number]

## 2023-04-23 NOTE — H&P (Signed)
History and Physical    Tony [name] Tony [name] DOB: 1950/07/23 DOA: 04/23/2023  PCP: Georgann Housekeeper, MD  Patient coming from: Independent living facility  I have personally briefly reviewed patient's old medical records in [facility]  Chief Complaint: Altered mental status  HPI: Tony [name] is a 72 y.o. male with medical history significant for chronic combined systolic and diastolic CHF (EF 41-66%), CHB s/p PPM, severe AS s/p TAVR, history of CVA on Plavix, CKD stage IIIa, T2DM, HTN, HLD, hypothyroidism, BPH, OSA, mild cognitive impairment who presented to the ED for evaluation of altered mental status.  History is limited from patient due to encephalopathy and is otherwise supplemented by EDP, chart review, and daughter by phone.  Patient resides at an independent living facility.  At baseline he is normally independent, able to manage his own medications and feed self.  He does have some short-term memory deficits per daughter.  Today when his daughter went to check in on him he was significantly confused, in bed without clothes on and having urinated all over himself.  He did not recognize family.  His daughter states that there has been a COVID outbreak in his facility.  On admission, patient is resting in bed.  He is awake and answering most questions appropriately and follow commands.  He knows he is in [facility] but does not know why.  He does not have any complaints.  ED Course  Labs/Imaging on admission: I have personally reviewed following labs and imaging studies.  Initial vitals showed BP 133/67, pulse 79, RR 20, temp 99.7 F, SpO2 95% on room air.  Labs show WBC 9.0, hemoglobin 15.4, platelets 150,000, sodium 136, potassium 4.3, bicarb 24, BUN 20, creatinine 1.54, serum glucose 194, AST 33, ALT 22, alk phos 64, total bilirubin 1.8, TSH 5.930.  SARS-CoV-2 and influenza A PCR's are positive.  RSV negative.  UA negative for UTI.  Portable chest x-ray  shows apparent blunting of the left lateral costophrenic angle which may be due to trace pleural effusion versus superimposed soft tissue.  No focal consolidation or pneumothorax.  CT head without contrast negative for acute CT finding.  Age-related volume loss.  Chronic small vessel changes of the white matter.  Ventricles are prominent, unchanged, inconsistent with central atrophy.  EDP contacted neurology who are consulting.  The hospitalist service was consulted to admit for further evaluation and management.  Review of Systems:  All systems reviewed and are negative except as documented in history of present illness above.   Past Medical History:  Diagnosis Date   Cancer [facility])    thyroid   CHF (congestive heart failure) (HCC)    Complete heart block (HCC)    a. 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 Excell Seltzer and Dr. Laneta Simmers   Severe aortic stenosis    Sleep apnea     Past Surgical History:  Procedure Laterality Date   CARDIAC CATHETERIZATION     INTRAOPERATIVE TRANSTHORACIC ECHOCARDIOGRAM N/A 08/29/2021   Procedure: INTRAOPERATIVE TRANSTHORACIC ECHOCARDIOGRAM;  Surgeon: Excell Seltzer,  Casimiro Needle, MD;  Location: [facility] INVASIVE CV LAB;  Service: Open Heart Surgery;  Laterality: N/A;   LUMBAR LAMINECTOMY/DECOMPRESSION MICRODISCECTOMY  07/19/2011   Procedure: LUMBAR LAMINECTOMY/DECOMPRESSION MICRODISCECTOMY 3 LEVELS;  Surgeon: Venita Lick, MD;  Location: [facility] OR;  Service: Orthopedics;  Laterality: Left;  Lumbar three-Lumbar five LEFT DECOMPRESSION AND FORAMINOTOMY Lumbar three-four LEFT DISCECTOMY   PERMANENT  PACEMAKER INSERTION N/A 08/11/2014   MDT Adapta L implanted by Dr Johney Frame for CHB   RIGHT HEART CATH AND CORONARY ANGIOGRAPHY N/A 08/07/2021   Procedure: RIGHT HEART CATH AND CORONARY ANGIOGRAPHY;  Surgeon: Tonny Bollman, MD;  Location: [facility] INVASIVE CV LAB;  Service: Cardiovascular;  Laterality: N/A;   RIGHT/LEFT HEART CATH AND CORONARY ANGIOGRAPHY N/A 08/07/2021   Procedure: RIGHT/LEFT HEART CATH AND CORONARY ANGIOGRAPHY;  Surgeon: Tonny Bollman, MD;  Location: [facility] INVASIVE CV LAB;  Service: Cardiovascular;  Laterality: N/A;   Thyroidectomy x2     TRANSCATHETER AORTIC VALVE REPLACEMENT, TRANSFEMORAL N/A 08/29/2021   Procedure: Transcatheter Aortic Valve Replacement, Transfemoral;  Surgeon: Tonny Bollman, MD;  Location: [facility] INVASIVE CV LAB;  Service: Open Heart Surgery;  Laterality: N/A;    Social History:  reports that he has never smoked. He has never used smokeless tobacco. He reports that he does not drink alcohol and does not use drugs.  Allergies  Allergen Reactions   Codeine Itching   Crestor [Rosuvastatin Calcium] Other (See Comments)    Leg pain   Metformin Hcl Diarrhea   Simvastatin Other (See Comments)    Leg pain    Family History  Problem Relation Age of Onset   Lung cancer Mother    Heart disease Father    Heart attack Father    Healthy Sister    Healthy Daughter    Healthy Son    Anesthesia problems Neg Hx    Hypotension Neg Hx    Pseudochol deficiency Neg Hx      Prior to Admission medications   Medication Sig Start Date End Date Taking? Authorizing Provider  atorvastatin (LIPITOR) 40 MG tablet Take 40 mg by mouth every evening. 07/06/21  Yes [provider]  canagliflozin (INVOKANA) 300 MG TABS tablet Take 300 mg by mouth every evening. 07/07/21  Yes [provider]  carvedilol (COREG) 6.25 MG tablet Take 6.25 mg by mouth 2 (two) times daily with a meal.   Yes [provider]  clopidogrel (PLAVIX) 75 MG tablet Take 1 tablet (75 mg  total) by mouth daily. 05/31/22  Yes Levert Feinstein, MD  clopidogrel (PLAVIX) 75 MG tablet Take 1 tablet (75 mg total) by mouth daily. Patient taking differently: Take 75 mg by mouth every evening. 01/24/23 01/24/24 Yes Carollee Herter, DO  famotidine (PEPCID) 20 MG tablet Take 1 tablet (20 mg total) by mouth daily. Patient taking differently: Take 20 mg by mouth 2 (two) times daily. 01/25/23  Yes Chen, Eric, DO  Finerenone (KERENDIA) 10 MG TABS Take 1 tablet (10 mg total) by mouth daily. 01/24/23  Yes Carollee Herter, DO  gabapentin (NEURONTIN) 100 MG capsule Take 1 capsule (100 mg total) by mouth at bedtime. Patient taking differently: Take 100 mg by mouth every evening. 01/24/23 04/24/23 Yes Carollee Herter, DO  levothyroxine (SYNTHROID) 175 MCG tablet Take 1 tablet (175 mcg total) by mouth at bedtime. Patient taking differently: Take 175 mcg by mouth every evening. 01/24/23  Yes Imogene Burn, Eric, DO  melatonin 10 MG TABS Take 10 mg by mouth at bedtime. Patient taking differently: Take 5 mg by mouth at bedtime. 01/24/23  Yes Carollee Herter, DO  memantine (NAMENDA) 10 MG tablet Take 1 tablet (10 mg total) by mouth 2 (two) times daily. 12/31/22  Yes McCue, Shanda Bumps, NP  sacubitril-valsartan (ENTRESTO) 24-26 MG Take 1 tablet by mouth daily. Patient taking differently: Take 1 tablet by mouth every evening. 08/22/22  Yes Georgie Chard D, NP  sertraline (ZOLOFT) 100 MG tablet Take 2 tablets (200 mg total) by mouth at bedtime. Patient taking differently: Take 200 mg by mouth every evening. 01/24/23  Yes Carollee Herter, DO  Tamsulosin HCl (FLOMAX) 0.4 MG CAPS Take 1 capsule (0.4 mg total) by mouth every morning. Patient taking differently: Take 0.4 mg by mouth every evening. 07/21/11  Yes Kovach, Lafonda Mosses, PA-C  vitamin B-12 (CYANOCOBALAMIN) 100 MCG tablet Take 1 tablet (100 mcg total) by mouth daily. 01/24/23 01/24/24 Yes Carollee Herter, DO  carvedilol (COREG) 3.125 MG tablet Take 1 tablet (3.125 mg total) by mouth 2 (two) times daily with a  meal. Patient not taking: Reported on 04/23/2023 10/11/21   Sheilah Pigeon, PA-C  lidocaine (LIDODERM) 5 % Place 1 patch onto the skin daily. Remove & Discard patch within 12 hours or as directed by MD Patient not taking: Reported on 04/23/2023 01/24/23   Carollee Herter, DO    Physical Exam: Vitals:   04/23/23 1537 04/23/23 1845 04/23/23 1900 04/23/23 1915  BP: 124/72 133/86 133/81 (!) 149/95  Pulse: 84 82 81 87  Resp: (!) 22 (!) 25 (!) 28 20  Temp: 99 F (37.2 C)     TempSrc: Oral     SpO2: 95% 93% 94% 96%  Weight:      Height:       Constitutional: Resting in bed, NAD, calm, comfortable Eyes: EOMI, lids and conjunctivae normal ENMT: Mucous membranes are moist. Posterior pharynx clear of any exudate or lesions.Normal dentition.  Neck: normal, supple, no masses. Respiratory: clear to auscultation bilaterally, no wheezing, no crackles. Normal respiratory effort. No accessory muscle use.  Cardiovascular: Regular rate and rhythm, no murmurs / rubs / gallops. No extremity edema. 2+ pedal pulses. Abdomen: no tenderness, no masses palpated.  Musculoskeletal: no clubbing / cyanosis. No joint deformity upper and lower extremities. Good ROM, no contractures. Normal muscle tone.  Skin: no rashes, lesions, ulcers. No induration Neurologic: CN 2-12 grossly intact. Sensation intact. Strength 5/5 in all 4.  Psychiatric: Alert and oriented to self, place but not year or situation.  Pleasant mood.  EKG: Personally reviewed. V paced rhythm, rate 80.  Similar to previous.  Assessment/Plan Principal Problem:   Acute metabolic encephalopathy Active Problems:   DM2 (diabetes mellitus, type 2) (HCC)   Acquired hypothyroidism   BPH (benign prostatic hyperplasia)   History of stroke   Influenza A   COVID-19 virus infection   Chronic combined systolic and diastolic CHF (congestive heart failure) (HCC)   Complete heart block (HCC)   Severe aortic stenosis   Hypertension associated with diabetes  (HCC)   OSA on CPAP   S/P TAVR (transcatheter aortic valve replacement)   VASILI JANAK is a 72 y.o. 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  Assessment and Plan: Acute metabolic encephalopathy in setting of COVID-19 and influenza A viral infections on background of MCI: Acute  encephalopathy secondary to COVID-19 influenza A viral infections.  Significant change from baseline per family.  He has been hemodynamically stable, saturating well on room air.  CXR without evidence of pneumonia.  Mildly hypovolemic. -Start Tamiflu, renal dose -Gentle IV fluid hydration overnight -PT/OT eval in a.m. -Delirium and fall precautions  Chronic combined systolic and diastolic CHF: Last EF 30-35%.  Appears slightly hypervolemic in setting of viral infections. -Holding Entresto and Invokana tonight -Continue Coreg -Monitor I/O's  CKD stage IIIa: Creatinine 1.54 slightly increased from labs in September but otherwise stable compared to labs going back to last year.  Does appear mildly hypovolemic on admission. -Gentle IV fluid hydration overnight -Can resume finerenone tomorrow  History of CVA: Continue Plavix and atorvastatin.  Type 2 diabetes: Placed on SSI.  Holding Invokana.  Hypertension: Continue Coreg.  Hypothyroidism: Continue Synthroid.  CHB s/p PPM: Paced rhythm on admission.  Severe aortic stenosis s/p TAVR: Continue Plavix.  Hyperlipidemia: Continue atorvastatin.  BPH: Continue Flomax.  Vitamin B12 deficiency: Continue B12 supplement.  Repeat B12 level 363 compared to 161 at September.  OSA: Continue CPAP nightly.   DVT prophylaxis: enoxaparin (LOVENOX)  injection 40 mg Start: 04/23/23 1930 Code Status: Full code, discussed with patient's daughter on admission. Family Communication: Daughter, Dr. Raylene Miyamoto, by phone on admission Disposition Plan: From ILF and likely discharge to same facility pending clinical progress Consults called: None Severity of Illness: The appropriate patient status for this patient is OBSERVATION. Observation status is judged to be reasonable and necessary in order to provide the required intensity of service to ensure the patient's safety. The patient's presenting symptoms, physical exam findings, and initial radiographic and laboratory data in the context of their medical condition is felt to place them at decreased risk for further clinical deterioration. Furthermore, it is anticipated that the patient will be medically stable for discharge from the hospital within 2 midnights of admission.   Darreld Mclean MD [facility]  If 7PM-7AM, please contact night-coverage [email or web address]  04/23/2023, 7:31 PM

## 2023-04-24 ENCOUNTER — Observation Stay (HOSPITAL_COMMUNITY): Payer: Medicare PPO

## 2023-04-24 DIAGNOSIS — I5042 Chronic combined systolic (congestive) and diastolic (congestive) heart failure: Secondary | ICD-10-CM | POA: Diagnosis present

## 2023-04-24 DIAGNOSIS — U071 COVID-19: Secondary | ICD-10-CM | POA: Diagnosis present

## 2023-04-24 DIAGNOSIS — E785 Hyperlipidemia, unspecified: Secondary | ICD-10-CM | POA: Diagnosis present

## 2023-04-24 DIAGNOSIS — E1169 Type 2 diabetes mellitus with other specified complication: Secondary | ICD-10-CM | POA: Diagnosis present

## 2023-04-24 DIAGNOSIS — E1122 Type 2 diabetes mellitus with diabetic chronic kidney disease: Secondary | ICD-10-CM | POA: Diagnosis present

## 2023-04-24 DIAGNOSIS — G9341 Metabolic encephalopathy: Secondary | ICD-10-CM | POA: Diagnosis present

## 2023-04-24 DIAGNOSIS — R410 Disorientation, unspecified: Secondary | ICD-10-CM | POA: Diagnosis not present

## 2023-04-24 DIAGNOSIS — E861 Hypovolemia: Secondary | ICD-10-CM | POA: Diagnosis present

## 2023-04-24 DIAGNOSIS — E1165 Type 2 diabetes mellitus with hyperglycemia: Secondary | ICD-10-CM | POA: Diagnosis present

## 2023-04-24 DIAGNOSIS — Z85850 Personal history of malignant neoplasm of thyroid: Secondary | ICD-10-CM | POA: Diagnosis not present

## 2023-04-24 DIAGNOSIS — E039 Hypothyroidism, unspecified: Secondary | ICD-10-CM

## 2023-04-24 DIAGNOSIS — R569 Unspecified convulsions: Secondary | ICD-10-CM

## 2023-04-24 DIAGNOSIS — I152 Hypertension secondary to endocrine disorders: Secondary | ICD-10-CM | POA: Diagnosis present

## 2023-04-24 DIAGNOSIS — E890 Postprocedural hypothyroidism: Secondary | ICD-10-CM | POA: Diagnosis present

## 2023-04-24 DIAGNOSIS — Z79890 Hormone replacement therapy (postmenopausal): Secondary | ICD-10-CM | POA: Diagnosis not present

## 2023-04-24 DIAGNOSIS — N1831 Chronic kidney disease, stage 3a: Secondary | ICD-10-CM | POA: Diagnosis present

## 2023-04-24 DIAGNOSIS — I442 Atrioventricular block, complete: Secondary | ICD-10-CM | POA: Diagnosis present

## 2023-04-24 DIAGNOSIS — K219 Gastro-esophageal reflux disease without esophagitis: Secondary | ICD-10-CM | POA: Diagnosis present

## 2023-04-24 DIAGNOSIS — F0390 Unspecified dementia without behavioral disturbance: Secondary | ICD-10-CM | POA: Diagnosis present

## 2023-04-24 DIAGNOSIS — R4182 Altered mental status, unspecified: Secondary | ICD-10-CM | POA: Diagnosis present

## 2023-04-24 DIAGNOSIS — N400 Enlarged prostate without lower urinary tract symptoms: Secondary | ICD-10-CM | POA: Diagnosis present

## 2023-04-24 DIAGNOSIS — E66811 Obesity, class 1: Secondary | ICD-10-CM | POA: Diagnosis present

## 2023-04-24 DIAGNOSIS — Z8616 Personal history of COVID-19: Secondary | ICD-10-CM | POA: Diagnosis not present

## 2023-04-24 DIAGNOSIS — J101 Influenza due to other identified influenza virus with other respiratory manifestations: Secondary | ICD-10-CM | POA: Diagnosis present

## 2023-04-24 DIAGNOSIS — Z950 Presence of cardiac pacemaker: Secondary | ICD-10-CM | POA: Diagnosis not present

## 2023-04-24 DIAGNOSIS — E538 Deficiency of other specified B group vitamins: Secondary | ICD-10-CM | POA: Diagnosis present

## 2023-04-24 DIAGNOSIS — Z952 Presence of prosthetic heart valve: Secondary | ICD-10-CM | POA: Diagnosis not present

## 2023-04-24 DIAGNOSIS — Z794 Long term (current) use of insulin: Secondary | ICD-10-CM | POA: Diagnosis not present

## 2023-04-24 LAB — GLUCOSE, CAPILLARY
Glucose-Capillary: 202 mg/dL — ABNORMAL HIGH (ref 70–99)
Glucose-Capillary: 199 mg/dL — ABNORMAL HIGH (ref 70–99)
Glucose-Capillary: 197 mg/dL — ABNORMAL HIGH (ref 70–99)
Glucose-Capillary: 179 mg/dL — ABNORMAL HIGH (ref 70–99)
Glucose-Capillary: 394 mg/dL — ABNORMAL HIGH (ref 70–99)

## 2023-04-24 LAB — CBC
HCT: 43.6 % (ref 39.0–52.0)
Hemoglobin: 14.2 g/dL (ref 13.0–17.0)
MCH: 30.1 pg (ref 26.0–34.0)
MCHC: 32.6 g/dL (ref 30.0–36.0)
MCV: 92.4 fL (ref 80.0–100.0)
Platelets: 130 10*3/uL — ABNORMAL LOW (ref 150–400)
RBC: 4.72 MIL/uL (ref 4.22–5.81)
RDW: 13.6 % (ref 11.5–15.5)
WBC: 7.3 10*3/uL (ref 4.0–10.5)
nRBC: 0 % (ref 0.0–0.2)

## 2023-04-24 LAB — BASIC METABOLIC PANEL
Anion gap: 15 (ref 5–15)
BUN: 36 mg/dL — ABNORMAL HIGH (ref 8–23)
CO2: 19 mmol/L — ABNORMAL LOW (ref 22–32)
Calcium: 8.5 mg/dL — ABNORMAL LOW (ref 8.9–10.3)
Chloride: 102 mmol/L (ref 98–111)
Creatinine, Ser: 1.62 mg/dL — ABNORMAL HIGH (ref 0.61–1.24)
GFR, Estimated: 45 mL/min — ABNORMAL LOW (ref >60)
Glucose, Bld: 192 mg/dL — ABNORMAL HIGH (ref 70–99)
Potassium: 3.8 mmol/L (ref 3.5–5.1)
Sodium: 136 mmol/L (ref 135–145)

## 2023-04-24 LAB — T4, FREE: Free T4: 0.76 ng/dL (ref 0.61–1.12)

## 2023-04-24 LAB — RPR: RPR Ser Ql: NONREACTIVE

## 2023-04-24 MED ORDER — VITAMIN B-12 1000 MCG PO TABS
500.0000 ug | ORAL_TABLET | Freq: Every day | ORAL | Status: DC
  Administered 2023-04-24: 500 ug via ORAL
  Filled 2023-04-24: qty 1

## 2023-04-24 MED ORDER — VITAMIN B-12 1000 MCG PO TABS
2000.0000 ug | ORAL_TABLET | Freq: Every day | ORAL | Status: DC
  Administered 2023-04-25: 2000 ug via ORAL
  Filled 2023-04-24: qty 2

## 2023-04-24 NOTE — Procedures (Signed)
Patient Name: GRACEN SLOTT  MRN: [id number]  Epilepsy Attending: Charlsie Quest  Referring Physician/Provider: Marjorie Smolder, NP  Date: 02/22/2023 Duration: 22.26 mins  Patient history: 72yo M with ams getting eeg to evaluate for seizure  Level of alertness: Awake  AEDs during EEG study: GBP  Technical aspects: This EEG study was done with scalp electrodes positioned according to the 10-20 International system of electrode placement. Electrical activity was reviewed with band pass filter of 1-70Hz , sensitivity of 7 uV/mm, display speed of 6mm/sec with a 60Hz  notched filter applied as appropriate. EEG data were recorded continuously and digitally stored.  Video monitoring was available and reviewed as appropriate.  Description: The posterior dominant rhythm consists of 7 Hz activity of moderate voltage (25-35 uV) seen predominantly in posterior head regions, symmetric and reactive to eye opening and eye closing. EEG showed continuous generalized predominantly 5 to 7 Hz theta slowing. Hyperventilation and photic stimulation were not performed.     ABNORMALITY - Continuous slow, generalized  IMPRESSION: This study is suggestive of mild to moderate diffuse encephalopathy. No seizures or epileptiform discharges were seen throughout the recording.  Yoshua Geisinger Annabelle Harman

## 2023-04-24 NOTE — Progress Notes (Signed)
NEUROLOGY CONSULT FOLLOW UP NOTE   Date of service: April 24, 2023 Patient Name: Tony [name] MRN:  [id number] DOB:  1951-04-22  Brief Review of HPI  Tony [name] is a 72 y.o. 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 who presented with altered mental status.  At baseline, patient has some memory impairment but is able to do all ADLs and lives in an independent living facility.  MoCA was 19/30 at last neurology appointment in August.  Patient reportedly was conversing normally the day before yesterday.  His daughter found him in his facility confused and without clothing and unable to follow relatively simple directions, such as how to put on clothing.  There was a COVID outbreak in his facility, and patient has tested positive for both influenza and COVID.  EEG was negative for seizure activity.   Interval Hx/subjective  Patient has been hemodynamically stable but febrile with Tmax of 102.  His mental status has improved per family, and his memory and cognition seem to be improved from yesterday's exam.  Vitals   Vitals:   04/23/23 1936 04/23/23 2130 04/24/23 0354 04/24/23 0524  BP:  (!) 145/79    Pulse:  79    Resp:  18    Temp: (!) 102 F (38.9 C) (!) 100.5 F (38.1 C) 99.1 F (37.3 C)   TempSrc:  Oral Oral   SpO2:  93%    Weight:  106.5 kg  106.7 kg  Height:  6' (1.829 m)       Body mass index is 31.9 kg/m.  Physical Exam   Constitutional: Appears well-developed and well-nourished.  Psych: Affect appropriate to situation.  Eyes: No scleral injection.  HENT: No OP obstrucion.  Head: Normocephalic.  Cardiovascular: Normal rate and  regular paced rhythm.  Respiratory: Effort normal, non-labored breathing.  Skin: WDI.   Neurologic Examination    Mental Status: Alert and oriented to person place time and situation but unable to remember events of yesterday.  3 out of 3 registration, 1 out of 3 recall, able to name 4 animals with 4 feet when asked.  He is able to follow simple and two-step commands Speech/Language: speech is without dysarthria or aphasia.   Cranial Nerves:  II: PERRL.  III, IV, VI: EOMI. Eyelids elevate symmetrically.  V: Sensation is intact to light touch and symmetrical to face.  VII: Smile is symmetrical.  VIII: hearing intact to voice. IX, X: Phonation is normal.  YQ:[id number] shrug 5/5. XII: tongue is midline without fasciculations. Motor: Able to move all 4 extremities with good antigravity strength Tone and bulk are normal Sensation: Intact to light touch bilaterally.  Coordination: FTN intact bilaterally Gait: Deferred   Medications  Current Facility-Administered Medications:    acetaminophen (TYLENOL) tablet 650 mg, 650 mg, Oral, Q6H PRN, 650 mg at 04/23/23 2005 **OR** acetaminophen (TYLENOL) suppository 650 mg, 650 mg, Rectal, Q6H PRN, Allena Katz, Vishal R, MD   atorvastatin (LIPITOR) tablet 40 mg, 40 mg, Oral, QPM, Patel, Vishal R, MD   carvedilol (COREG) tablet 6.25 mg, 6.25 mg, Oral, BID WC, Patel, Vishal R, MD   clopidogrel (PLAVIX) tablet 75 mg, 75 mg, Oral, Daily, Darreld Mclean R, MD, 75 mg at 04/23/23 1946   enoxaparin (LOVENOX) injection 40 mg,  40 mg, Subcutaneous, Q24H, Charlsie Quest, MD, 40 mg at 04/23/23 1946   famotidine (PEPCID) tablet 20 mg, 20 mg, Oral, BID, Darreld Mclean R, MD, 20 mg at 04/23/23 2226   Finerenone TABS 10 mg, 1 tablet, Oral, Daily, Charlsie Quest, MD   gabapentin (NEURONTIN) capsule 100 mg, 100 mg, Oral, QPM, Darreld Mclean R, MD, 100 mg at 04/23/23 1946   insulin aspart (novoLOG) injection 0-5 Units, 0-5 Units, Subcutaneous, QHS, Charlsie Quest, MD, 3 Units  at 04/24/23 0030   insulin aspart (novoLOG) injection 0-9 Units, 0-9 Units, Subcutaneous, TID WC, Patel, Floreen Comber, MD   levothyroxine (SYNTHROID) tablet 175 mcg, 175 mcg, Oral, QPM, Charlsie Quest, MD, 175 mcg at 04/23/23 2033   melatonin tablet 5 mg, 5 mg, Oral, QHS, Darreld Mclean R, MD, 5 mg at 04/23/23 2227   memantine (NAMENDA) tablet 10 mg, 10 mg, Oral, BID, Darreld Mclean R, MD, 10 mg at 04/23/23 2227   ondansetron (ZOFRAN) tablet 4 mg, 4 mg, Oral, Q6H PRN **OR** ondansetron (ZOFRAN) injection 4 mg, 4 mg, Intravenous, Q6H PRN, Charlsie Quest, MD   [COMPLETED] oseltamivir (TAMIFLU) capsule 75 mg, 75 mg, Oral, Once, 75 mg at 04/23/23 2021 **FOLLOWED BY** oseltamivir (TAMIFLU) capsule 30 mg, 30 mg, Oral, BID, Patel, Vishal R, MD   senna-docusate (Senokot-S) tablet 1 tablet, 1 tablet, Oral, QHS PRN, Darreld Mclean R, MD   sertraline (ZOLOFT) tablet 200 mg, 200 mg, Oral, QPM, Patel, Vishal R, MD, 200 mg at 04/23/23 2033   sodium chloride flush (NS) 0.9 % injection 3 mL, 3 mL, Intravenous, Q12H, Patel, Vishal R, MD, 3 mL at 04/23/23 2227   tamsulosin (FLOMAX) capsule 0.4 mg, 0.4 mg, Oral, QPM, Darreld Mclean R, MD, 0.4 mg at 04/23/23 2033   vitamin B-12 (CYANOCOBALAMIN) tablet 100 mcg, 100 mcg, Oral, Daily, Charlsie Quest, MD  Labs and Diagnostic Imaging   CBC:  Recent Labs  Lab 04/23/23 1138  WBC 9.0  NEUTROABS 7.6  HGB 15.4  HCT 47.5  MCV 92.2  PLT 150    Basic Metabolic Panel:  Lab Results  Component Value Date   NA 136 04/23/2023   K 4.3 04/23/2023   CO2 24 04/23/2023   GLUCOSE 194 (H) 04/23/2023   BUN 20 04/23/2023   CREATININE 1.54 (H) 04/23/2023   CALCIUM 9.2 04/23/2023   GFRNONAA 47 (L) 04/23/2023   GFRAA 80 12/17/2019   Lipid Panel:  Lab Results  Component Value Date   LDLCALC 75 03/23/2022   HgbA1c:  Lab Results  Component Value Date   HGBA1C 8.4 (H) 01/23/2023   Urine Drug Screen:     Component Value Date/Time   LABOPIA NONE DETECTED 03/22/2022 2116    COCAINSCRNUR NONE DETECTED 03/22/2022 2116   LABBENZ NONE DETECTED 03/22/2022 2116   AMPHETMU NONE DETECTED 03/22/2022 2116   THCU NONE DETECTED 03/22/2022 2116   LABBARB NONE DETECTED 03/22/2022 2116    Alcohol Level     Component Value Date/Time   ETH <10 03/22/2022 1839   INR  Lab Results  Component Value Date   INR 1.0 03/22/2022   APTT  Lab Results  Component Value Date   APTT 26 03/22/2022   Ammonia 19 ESR 15 CRP 9.1 B12 363 Folate 13.4 B6 pending Thiamine pending RPR pending TSH 5.930 Respiratory panel positive for both influenza A and COVID  CT Head without contrast(Personally reviewed): No acute findings. Atrophy in an anteromedial frontal and bilateral anterior temporal distribution, in addition to  bilateral hippocampal atrophy is noted. Atrophy subtly worse on right side compared to the left.   MRI Brain(Personally reviewed): Unable to be performed due to pacemaker  rEEG:  Continuous slow, generalized, suggestive of mild to moderate diffuse encephalopathy with no seizures or epileptiform discharges  Assessment  Tony [name] is a 72 y.o. male with a history of thyroid cancer, CHF, heart block with pacer, diabetes, GERD, hypertension, hypothyroidism, TAVR, aortic stenosis, sleep apnea, stroke and mild dementia who presents from his independent living facility with altered mental status.  Patient was in his normal state of health on 12/23, but on 12/24 was found to be disoriented and unable to follow simple directions, such as how to put on clothing.  There has been a COVID outbreak in his facility, and respiratory panel here has come back positive for both influenza A and COVID. He has been placed on Tamiflu by primary team. - The most likely explanation for his altered mental status is delirium in the setting of acute viral illness.  He has improved on exam today, and family states that he is much closer to his baseline than he was yesterday.  CRP is elevated,  but this is likely due to acute infection.   - B12 is slightly low by neurological standards at 363, would increase supplementation to 2000 mcg daily.  - TSH is high, primary team to follow-up on T4 given history of altered mental status with hypothyroidism.   - EEG was negative for seizure activity, but did show continuous generalized slowing.  Recommendations  -Continue treatment of influenza A and COVID -Delirium precautions --B12 supplementation at 2000 mcg po every day - Will need outpatient dementia clinic follow up for Alzheimer's versus FTLD.  -Neurology team will sign off, please reconsult if mental status does not return to baseline with resolution of infection ______________________________________________________________________   Signed, Cortney Harland Dingwall, NP [facility]   Electronically signed: Dr. Caryl Pina

## 2023-04-24 NOTE — Plan of Care (Signed)
  Problem: Nutritional: Goal: Maintenance of adequate nutrition will improve Outcome: Progressing   Problem: Respiratory: Goal: Complications related to the disease process, condition or treatment will be avoided or minimized Outcome: Progressing   Problem: Health Behavior/Discharge Planning: Goal: Ability to manage health-related needs will improve Outcome: Progressing   Problem: Clinical Measurements: Goal: Will remain free from infection Outcome: Progressing Goal: Respiratory complications will improve Outcome: Progressing   Problem: Activity: Goal: Risk for activity intolerance will decrease Outcome: Progressing

## 2023-04-24 NOTE — Progress Notes (Signed)
EEG complete - results pending 

## 2023-04-24 NOTE — Progress Notes (Signed)
[facility] PROGRESS NOTE   Tony [name] WJX:[id number] DOB: [birth year] DOA: 04/23/2023  PCP: Georgann Housekeeper, MD  Brief History: 72 y.o. 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  Patient lives at an independent living facility.  Apparently there has been a COVID-19 outbreak in that facility.  Patient was noted to be febrile.  He was positive for both COVID-19 as well as influenza A.  Hospitalized for further management.    Consultants: Neurology  Procedures: EEG    Subjective/Interval History: Patient mentions that he feels better this morning.  Not as confused as he was yesterday.  He was able to answer my orientation questions.  Has had a cough.  But denies any shortness of breath.  No chest pain.  No nausea or vomiting.    Assessment/Plan:  Acute metabolic encephalopathy This is in the setting of acute influenza as well as COVID-19.  Does not have any focal neurological deficits.  CT head did not show any acute findings.  EEG is unremarkable. No concern for meningitis. Mentation appears to have improved this morning.  Does not have any focal deficits. Continue to monitor.  Reorient daily. PT and OT evaluation.  Influenza A Continue with Tamiflu.  COVID-19 infection No opacities noted on chest x-ray.  Patient mentions that he had COVID-19 earlier this year though he is not certain.  No clear indication to initiate COVID-19 specific treatments at this time.  No clear indication to give steroids either.  Would hesitate to give steroids to begin with considering his encephalopathy. Noted to be on oxygen.  Not sure if he was truly hypoxic.  Room air saturations documented in the mid 90s.  Try to wean him off of  oxygen.  Incentive spirometry.  Chronic combined systolic and diastolic CHF LVEF noted to be 30 to 35%.  Volume status seems to be stable this morning.  Sherryll Burger and Invokana were held.  Slight increase in creatinine noted.  Will continue to hold these medications for now. Continue carvedilol.  Chronic kidney disease stage IIIa Baseline creatinine seems to be between 1 and 1.5.  Slightly higher than baseline creatinine noted.  BUN is also noted to be elevated.  Could be mildly hypovolemic from his acute infection.  Not noted to be on any diuretics.  Will allow him to oral hydrate for now.  Recheck labs tomorrow.  History of stroke Continue Plavix and statin.  Diabetes mellitus type 2 Holding Invokana.  SSI.  Essential hypertension  Monitor blood pressures closely.  Hypothyroidism Continue levothyroxine.  TSH 5.9.  Free T40.76.  Complete heart block status post pacemaker Paced rhythm noted.  History of severe aortic stenosis status post TAVR Stable.  Hyperlipidemia Continue statin.  History of BPH Continue Flomax.  History of vitamin B12 deficiency Continue cyanocobalamin.  OSA: Continue CPAP nightly.  Obesity Estimated body mass index is 31.9 kg/m as calculated from the following:   Height as of this encounter: 6' (1.829 m).   Weight as of this encounter: 106.7 kg.   DVT Prophylaxis: Lovenox Code Status: Full code Family Communication: No family at bedside.  Will update daughter later today Disposition Plan: To be determined.  From ILF  Status is: Observation The patient will require care spanning > 2 midnights  and should be moved to inpatient because: Acute encephalopathy      Medications: Scheduled:  atorvastatin  40 mg Oral QPM   carvedilol  6.25 mg Oral BID WC   clopidogrel  75 mg Oral Daily   vitamin B-12  500 mcg Oral Daily   enoxaparin (LOVENOX) injection  40 mg Subcutaneous Q24H   famotidine  20 mg Oral BID   Finerenone  1 tablet Oral Daily    gabapentin  100 mg Oral QPM   insulin aspart  0-5 Units Subcutaneous QHS   insulin aspart  0-9 Units Subcutaneous TID WC   levothyroxine  175 mcg Oral QPM   melatonin  5 mg Oral QHS   memantine  10 mg Oral BID   oseltamivir  30 mg Oral BID   sertraline  200 mg Oral QPM   sodium chloride flush  3 mL Intravenous Q12H   tamsulosin  0.4 mg Oral QPM   Continuous: ZOX:[id number] **OR** acetaminophen, ondansetron **OR** ondansetron (ZOFRAN) IV, senna-docusate  Antibiotics: Anti-infectives (From admission, onward)    Start     Dose/Rate Route Frequency Ordered Stop   04/24/23 1000  oseltamivir (TAMIFLU) capsule 30 mg       Placed in "Followed by" Linked Group   30 mg Oral 2 times daily 04/23/23 1919     04/23/23 1930  oseltamivir (TAMIFLU) capsule 75 mg       Placed in "Followed by" Linked Group   75 mg Oral  Once 04/23/23 1919 04/23/23 2021       Objective:  Vital Signs  Vitals:   04/23/23 1936 04/23/23 2130 04/24/23 0354 04/24/23 0524  BP:  (!) 145/79    Pulse:  79    Resp:  18    Temp: (!) 102 F (38.9 C) (!) 100.5 F (38.1 C) 99.1 F (37.3 C)   TempSrc:  Oral Oral   SpO2:  93%    Weight:  106.5 kg  106.7 kg  Height:  6' (1.829 m)      Intake/Output Summary (Last 24 hours) at 04/24/2023 0957 Last data filed at 04/24/2023 0525 Gross per 24 hour  Intake 120 ml  Output 500 ml  Net -380 ml   Filed Weights   04/23/23 1121 04/23/23 2130 04/24/23 0524  Weight: 112 kg 106.5 kg 106.7 kg    General appearance: Awake alert.  In no distress Resp: Normal effort at rest.  Coarse breath sound bilaterally.  No definite wheezing or rhonchi.  No definite crackles. Cardio: S1-S2 is normal regular.  No S3-S4.  No rubs murmurs or bruit GI: Abdomen is soft.  Nontender nondistended.  Bowel sounds are present normal.  No masses organomegaly Extremities: No edema.  Full range of motion of lower extremities. Neurologic: Alert.  Oriented to year month date place.  No focal  neurological deficits.    Lab Results:  Data Reviewed: I have personally reviewed following labs and reports of the imaging studies  CBC: Recent Labs  Lab 04/23/23 1138 04/24/23 0429  WBC 9.0 7.3  NEUTROABS 7.6  --   HGB 15.4 14.2  HCT 47.5 43.6  MCV 92.2 92.4  PLT 150 130*    Basic Metabolic Panel: Recent Labs  Lab 04/23/23 1138 04/24/23 0429  NA 136 136  K 4.3 3.8  CL 98 102  CO2 24 19*  GLUCOSE 194* 192*  BUN 20 36*  CREATININE 1.54* 1.62*  CALCIUM 9.2 8.5*    GFR: Estimated Creatinine Clearance: 51.2 mL/min (A) (by C-G  formula based on SCr of 1.62 mg/dL (H)).  Liver Function Tests: Recent Labs  Lab 04/23/23 1138  AST 33  ALT 22  ALKPHOS 64  BILITOT 1.8*  PROT 7.7  ALBUMIN 4.1    Recent Labs  Lab 04/23/23 2224  AMMONIA 19    CBG: Recent Labs  Lab 04/23/23 1143 04/23/23 1603 04/23/23 2340 04/24/23 0351 04/24/23 0805  GLUCAP 188* 192* 260* 202* 199*     Thyroid Function Tests: Recent Labs    04/23/23 1631 04/24/23 0429  TSH 5.930*  --   FREET4  --  0.76    Anemia Panel: Recent Labs    04/23/23 1631  VITAMINB12 363  FOLATE 13.4    Recent Results (from the past 240 hours)  Resp panel by RT-PCR (RSV, Flu A&B, Covid) Anterior Nasal Swab     Status: Abnormal   Collection Time: 04/23/23  3:33 PM   Specimen: Anterior Nasal Swab  Result Value Ref Range Status   SARS Coronavirus 2 by RT PCR POSITIVE (A) NEGATIVE Final   Influenza A by PCR POSITIVE (A) NEGATIVE Final   Influenza B by PCR NEGATIVE NEGATIVE Final    Comment: (NOTE) The Xpert Xpress SARS-CoV-2/FLU/RSV plus assay is intended as an aid in the diagnosis of influenza from Nasopharyngeal swab specimens and should not be used as a sole basis for treatment. Nasal washings and aspirates are unacceptable for Xpert Xpress SARS-CoV-2/FLU/RSV testing.  Fact Sheet for Patients: BloggerCourse.com  Fact Sheet for Healthcare  Providers: SeriousBroker.it  This test is not yet approved or cleared by the [place] FDA and has been authorized for detection and/or diagnosis of SARS-CoV-2 by FDA under an Emergency Use Authorization (EUA). This EUA will remain in effect (meaning this test can be used) for the duration of the COVID-19 declaration under Section 564(b)(1) of the Act, 21 U.S.C. section 360bbb-3(b)(1), unless the authorization is terminated or revoked.     Resp Syncytial Virus by PCR NEGATIVE NEGATIVE Final    Comment: (NOTE) Fact Sheet for Patients: BloggerCourse.com  Fact Sheet for Healthcare Providers: SeriousBroker.it  This test is not yet approved or cleared by the [place] FDA and has been authorized for detection and/or diagnosis of SARS-CoV-2 by FDA under an Emergency Use Authorization (EUA). This EUA will remain in effect (meaning this test can be used) for the duration of the COVID-19 declaration under Section 564(b)(1) of the Act, 21 U.S.C. section 360bbb-3(b)(1), unless the authorization is terminated or revoked.  Performed at [facility], [address] [place], [place] [address]       Radiology Studies: EEG adult Result Date: 04/24/2023 Charlsie Quest, MD     04/24/2023  6:27 AM Patient Name: Tony [name] MRN: [id number] Epilepsy Attending: Charlsie Quest Referring Physician/Provider: Marjorie Smolder, NP Date: 02/22/2023 Duration: 22.26 mins Patient history: 71yo M with ams getting eeg to evaluate for seizure Level of alertness: Awake AEDs during EEG study: GBP Technical aspects: This EEG study was done with scalp electrodes positioned according to the 10-20 International system of electrode placement. Electrical activity was reviewed with band pass filter of 1-70Hz , sensitivity of 7 uV/mm, display speed of 27mm/sec with a 60Hz  notched filter applied as appropriate. EEG data were  recorded continuously and digitally stored.  Video monitoring was available and reviewed as appropriate. Description: The posterior dominant rhythm consists of 7 Hz activity of moderate voltage (25-35 uV) seen predominantly in posterior head regions, symmetric and reactive to eye opening  and eye closing. EEG showed continuous generalized predominantly 5 to 7 Hz theta slowing. Hyperventilation and photic stimulation were not performed.   ABNORMALITY - Continuous slow, generalized IMPRESSION: This study is suggestive of mild to moderate diffuse encephalopathy. No seizures or epileptiform discharges were seen throughout the recording. Charlsie Quest   CT HEAD WO CONTRAST Result Date: 04/23/2023 CLINICAL DATA:  Mental status change of unknown cause EXAM: CT HEAD WITHOUT CONTRAST TECHNIQUE: Contiguous axial images were obtained from the base of the skull through the vertex without intravenous contrast. RADIATION DOSE REDUCTION: This exam was performed according to the departmental dose-optimization program which includes automated exposure control, adjustment of the mA and/or kV according to patient size and/or use of iterative reconstruction technique. COMPARISON:  01/22/2023 FINDINGS: Brain: Age related volume loss. No focal abnormality affects the brainstem or cerebellum. Cerebral hemispheres show chronic small-vessel change of the white matter but no sign of acute or subacute infarction, mass lesion, hemorrhage, hydrocephalus or extra-axial collection. Ventricles are prominent, unchanged, inconsistent with central atrophy. Vascular: There is atherosclerotic calcification of the major vessels at the base of the brain. Skull: Negative Sinuses/Orbits: Clear/normal Other: None IMPRESSION: No acute CT finding. Age related volume loss. Chronic small-vessel change of the white matter. Ventricles are prominent, unchanged, inconsistent with central atrophy. Electronically Signed   By: Paulina Fusi M.D.   On: 04/23/2023  12:46   DG Chest Port 1 View Result Date: 04/23/2023 CLINICAL DATA:  Loss of consciousness. EXAM: PORTABLE CHEST 1 VIEW COMPARISON:  01/23/2023. FINDINGS: Low lung volume. There are probable atelectatic changes at the lung bases. There is apparent blunting of left lateral costophrenic angle which may be due to trace pleural effusion versus superimposed soft tissue. Bilateral lung fields are otherwise clear. Right lateral costophrenic angle is clear. Stable cardio-mediastinal silhouette. There is a left sided 2-lead pacemaker. Prosthetic aortic valve seen. No acute osseous abnormalities. The soft tissues are within normal limits. IMPRESSION: *Apparent blunting of left lateral costophrenic angle may be due to trace pleural effusion versus superimposed soft tissue. *Bilateral lung fields are otherwise clear. No dense consolidation or lung collapse. Electronically Signed   By: Jules Schick M.D.   On: 04/23/2023 12:27       LOS: 0 days   Charlye Spare Rito Ehrlich  [facility] Pager on [email or web address]  04/24/2023, 9:57 AM

## 2023-04-24 NOTE — Plan of Care (Signed)
Pt has rested quietly throughout the night with no distress noted. Alert and oriented to self and year. On O2 4 LNC. Applied after admission from ER when sats dropped to mid 80's. V-paced on the monitor. [place] on to suction. EEG done. No complaints voiced.     Problem: Respiratory: Goal: Will maintain a patent airway Outcome: Progressing   Problem: Clinical Measurements: Goal: Respiratory complications will improve Outcome: Progressing   Problem: Activity: Goal: Risk for activity intolerance will decrease Outcome: Progressing   Problem: Coping: Goal: Level of anxiety will decrease Outcome: Progressing   Problem: Pain Management: Goal: General experience of comfort will improve Outcome: Progressing

## 2023-04-25 DIAGNOSIS — G9341 Metabolic encephalopathy: Secondary | ICD-10-CM | POA: Diagnosis not present

## 2023-04-25 LAB — BASIC METABOLIC PANEL
Anion gap: 11 (ref 5–15)
BUN: 43 mg/dL — ABNORMAL HIGH (ref 8–23)
CO2: 24 mmol/L (ref 22–32)
Calcium: 8.3 mg/dL — ABNORMAL LOW (ref 8.9–10.3)
Chloride: 104 mmol/L (ref 98–111)
Creatinine, Ser: 1.66 mg/dL — ABNORMAL HIGH (ref 0.61–1.24)
GFR, Estimated: 43 mL/min — ABNORMAL LOW (ref >60)
Glucose, Bld: 148 mg/dL — ABNORMAL HIGH (ref 70–99)
Potassium: 3.9 mmol/L (ref 3.5–5.1)
Sodium: 139 mmol/L (ref 135–145)

## 2023-04-25 LAB — CBC
HCT: 39.8 % (ref 39.0–52.0)
Hemoglobin: 12.9 g/dL — ABNORMAL LOW (ref 13.0–17.0)
MCH: 30.3 pg (ref 26.0–34.0)
MCHC: 32.4 g/dL (ref 30.0–36.0)
MCV: 93.4 fL (ref 80.0–100.0)
Platelets: 122 10*3/uL — ABNORMAL LOW (ref 150–400)
RBC: 4.26 MIL/uL (ref 4.22–5.81)
RDW: 13.9 % (ref 11.5–15.5)
WBC: 5.4 10*3/uL (ref 4.0–10.5)
nRBC: 0 % (ref 0.0–0.2)

## 2023-04-25 LAB — GLUCOSE, CAPILLARY
Glucose-Capillary: 145 mg/dL — ABNORMAL HIGH (ref 70–99)
Glucose-Capillary: 237 mg/dL — ABNORMAL HIGH (ref 70–99)

## 2023-04-25 MED ORDER — OSELTAMIVIR PHOSPHATE 30 MG PO CAPS: 30.0000 mg | ORAL_CAPSULE | Freq: Two times a day (BID) | ORAL | 0 refills | Status: AC

## 2023-04-25 NOTE — TOC Progression Note (Signed)
Transition of Care [facility]) - Progression Note    Patient Details  Name: Tony [name] MRN: [id number] Date of Birth: October 01, 1950  Transition of [facility]) CM/SW Contact  Gordy Clement, RN Phone Number: 04/25/2023, 10:07 AM  Clinical Narrative:     Patient will DC to ILF today per Daughter   [facility] will provide PT/OT per recommendations Son to transport home   No additional TOC needs          Expected Discharge Plan and Services         Expected Discharge Date: 04/25/23                                     Social Determinants of Health (SDOH) Interventions SDOH Screenings   Food Insecurity: No Food Insecurity (04/23/2023)  Housing: Low Risk  (04/23/2023)  Transportation Needs: No Transportation Needs (04/23/2023)  Utilities: Not At Risk (04/23/2023)  Depression (PHQ2-9): Low Risk  (03/09/2022)  Recent Concern: Depression (PHQ2-9) - Medium Risk (12/21/2021)  Tobacco Use: Low Risk  (04/23/2023)    Readmission Risk Interventions    08/30/2021   12:53 PM  Readmission Risk Prevention Plan  Post Dischage Appt Complete  Medication Screening Complete  Transportation Screening Complete

## 2023-04-25 NOTE — Plan of Care (Signed)
  Problem: Education: Goal: Ability to describe self-care measures that may prevent or decrease complications (Diabetes Survival Skills Education) will improve Outcome: Progressing Goal: Individualized Educational Video(s) Outcome: Progressing   Problem: Coping: Goal: Ability to adjust to condition or change in health will improve Outcome: Progressing   Problem: Fluid Volume: Goal: Ability to maintain a balanced intake and output will improve Outcome: Progressing   Problem: Health Behavior/Discharge Planning: Goal: Ability to identify and utilize available resources and services will improve Outcome: Progressing Goal: Ability to manage health-related needs will improve Outcome: Progressing   Problem: Skin Integrity: Goal: Risk for impaired skin integrity will decrease Outcome: Progressing   Problem: Tissue Perfusion: Goal: Adequacy of tissue perfusion will improve Outcome: Progressing   Problem: Nutritional: Goal: Maintenance of adequate nutrition will improve Outcome: Progressing Goal: Progress toward achieving an optimal weight will improve Outcome: Progressing

## 2023-04-25 NOTE — Evaluation (Signed)
Physical Therapy Evaluation Patient Details Name: Tony [name] MRN: [id number] DOB: [birth year] Today's Date: 04/25/2023  History of Present Illness  72 y.o. male who presented to the ED for evaluation of altered mental status.  Patient lives at an independent living facility.  Apparently there has been a COVID-19 outbreak in that facility.  Patient was noted to be febrile.  He was positive for both COVID-19 as well as influenza A. Past medical history significant for chronic combined systolic and diastolic CHF (EF 66-44%), CHB s/p PPM, severe AS s/p TAVR, history of CVA on Plavix, CKD stage IIIa, T2DM, HTN, HLD, hypothyroidism, BPH, OSA, mild cognitive impairment.  Clinical Impression  Pt presents with admitting diagnosis above. Co-treat with OT. Pt today required Min A for bed mobility, Min/Mod A for sit to stands, and Min A to ambulate in the room. Pt noted with several losses of balance seated EOB and very "wobbly" gait in room. Pt required constant cues for safety and proximity to RW. PTA pt reports he was Mod I RW and receiving PT at ILF. Pt was able to transition to CGA for mobility as session progress however still not quite at baseline. If pt were to DC home today then pt would require 24 hour assistance or will benefit from continued inpatient follow up therapy, <3 hours/day. However, if pt were stay another day or 2 in the hospital then anticipate that pt could return home with HHPT. PT will continue to follow if still admitted.       If plan is discharge home, recommend the following: A little help with walking and/or transfers;A little help with bathing/dressing/bathroom;Assistance with cooking/housework;Direct supervision/assist for medications management;Assist for transportation;Help with stairs or ramp for entrance;Supervision due to cognitive status   Can travel by private vehicle   No    Equipment Recommendations Other (comment) (Per accepting facility)  Recommendations for  Other Services       Functional Status Assessment Patient has had a recent decline in their functional status and demonstrates the ability to make significant improvements in function in a reasonable and predictable amount of time.     Precautions / Restrictions Precautions Precautions: Fall;Other (comment) Precaution Comments: Covid Restrictions Weight Bearing Restrictions Per Provider Order: No      Mobility  Bed Mobility Overal bed mobility: Needs Assistance Bed Mobility: Supine to Sit     Supine to sit: Min assist     General bed mobility comments: Pt reliant on momentum and required multiple attempts to sit up. Pt had several posterior and L lateral losses of balance.    Transfers Overall transfer level: Needs assistance Equipment used: Rolling walker (2 wheels) Transfers: Sit to/from Stand Sit to Stand: Mod assist, Min assist           General transfer comment: Cues for hand placement. Initial sit to stand required Mod A to stand however pt immediately had to sit down. Next attempt required Min A to power up.    Ambulation/Gait Ambulation/Gait assistance: Min assist, Contact guard assist Gait Distance (Feet): 25 Feet (10+15) Assistive device: Rolling walker (2 wheels) Gait Pattern/deviations: Narrow base of support, Trunk flexed, Drifts right/left, Decreased stride length, Step-through pattern, Scissoring Gait velocity: decreased     General Gait Details: Pt initially required Min A to ambulate and noted to be very wobbly. Constant cues for safety and proximity to RW. Few minor LOB noted when turning requiring Min A however pt did progress to CGA. Limited to room ambulation due to precautions.  Stairs            Wheelchair Mobility     Tilt Bed    Modified Rankin (Stroke Patients Only)       Balance Overall balance assessment: Needs assistance Sitting-balance support: Bilateral upper extremity supported, Feet supported Sitting balance-Leahy  Scale: Poor Sitting balance - Comments: Several posterior and L lateral losses of balance. Postural control: Posterior lean, Left lateral lean Standing balance support: Bilateral upper extremity supported, During functional activity Standing balance-Leahy Scale: Poor Standing balance comment: Reliant on RW                             Pertinent Vitals/Pain Pain Assessment Pain Assessment: No/denies pain    Home Living Family/patient expects to be discharged to:: Other (Comment) (ILF)                   Additional Comments: uses elevator, gets to/from with RW. Pt has DME at home through facility. Bathroom has grab bars, seat, and walkin shower. Pt has 4 falls in the last year whcih led to his transfer into ILF    Prior Function Prior Level of Function : Independent/Modified Independent;History of Falls (last six months)             Mobility Comments: ambulates with RW ADLs Comments: ind, gets meals at facility     Extremity/Trunk Assessment   Upper Extremity Assessment Upper Extremity Assessment: Defer to OT evaluation    Lower Extremity Assessment Lower Extremity Assessment: Generalized weakness    Cervical / Trunk Assessment Cervical / Trunk Assessment: Normal  Communication   Communication Communication: No apparent difficulties Cueing Techniques: Verbal cues;Tactile cues  Cognition Arousal: Alert Behavior During Therapy: WFL for tasks assessed/performed Overall Cognitive Status: No family/caregiver present to determine baseline cognitive functioning                                 General Comments: A&0x3, reports it is the year 2025. Pt jokingly reports he is just trying to gt over this current year.        General Comments General comments (skin integrity, edema, etc.): VSS on RA    Exercises     Assessment/Plan    PT Assessment Patient needs continued PT services  PT Problem List Decreased strength;Decreased range of  motion;Decreased activity tolerance;Decreased mobility;Decreased balance;Decreased coordination;Decreased cognition;Decreased knowledge of use of DME;Decreased safety awareness;Cardiopulmonary status limiting activity;Decreased knowledge of precautions       PT Treatment Interventions DME instruction;Gait training;Stair training;Functional mobility training;Therapeutic activities;Therapeutic exercise;Balance training;Neuromuscular re-education;Patient/family education;Cognitive remediation    PT Goals (Current goals can be found in the Care Plan section)  Acute Rehab PT Goals Patient Stated Goal: to go home PT Goal Formulation: With patient Time For Goal Achievement: 05/09/23 Potential to Achieve Goals: Fair    Frequency Min 1X/week     Co-evaluation               AM-PAC PT "6 Clicks" Mobility  Outcome Measure Help needed turning from your back to your side while in a flat bed without using bedrails?: A Little Help needed moving from lying on your back to sitting on the side of a flat bed without using bedrails?: A Little Help needed moving to and from a bed to a chair (including a wheelchair)?: A Little Help needed standing up from a chair using your arms (e.g., wheelchair  or bedside chair)?: A Lot Help needed to walk in hospital room?: A Little Help needed climbing 3-5 steps with a railing? : Total 6 Click Score: 15    End of Session Equipment Utilized During Treatment: Gait belt Activity Tolerance: Patient limited by fatigue Patient left: in chair;with call bell/phone within reach Nurse Communication: Mobility status PT Visit Diagnosis: Other abnormalities of gait and mobility (R26.89)    Time: 7829-5621 PT Time Calculation (min) (ACUTE ONLY): 39 min   Charges:   PT Evaluation $PT Eval Moderate Complexity: 1 Mod PT Treatments $Gait Training: 8-22 mins PT General Charges $$ ACUTE PT VISIT: 1 Visit         Shela Nevin, PT, DPT Acute Rehab Services [phone]    Gladys Damme 04/25/2023, 10:59 AM

## 2023-04-25 NOTE — TOC Transition Note (Signed)
Transition of Care [facility]) - Discharge Note   Patient Details  Name: Tony [name] MRN: [id number] Date of Birth: 09/17/1950  Transition of [facility]) CM/SW Contact:  Gordy Clement, RN Phone Number: 04/25/2023, 11:12 AM   Clinical Narrative:   Patient will DC to [facility] today,where he lives with his Spouse. Patie t is current with [facility] and they will resume services.  RNCM spoke with manager on duty today at [facility] [phone])   Son will transport to ILF this afternoon at 1:45 pm .   No additional TOC needs      Final next level of care: Home w Home Health Services Barriers to Discharge: No Barriers Identified   Patient Goals and CMS Choice Patient states their goals for this hospitalization and ongoing recovery are:: Return home CMS Medicare.gov Compare Post Acute Care list provided to:: Patient Choice offered to / list presented to : Patient, Adult Children [place] ownership interest in [facility].provided to:: Adult Children    Discharge Placement                       Discharge Plan and Services Additional resources added to the After Visit Summary for     Discharge Planning Services: CM Consult Post Acute Care Choice: Home Health                               Social Drivers of Health (SDOH) Interventions SDOH Screenings   Food Insecurity: No Food Insecurity (04/23/2023)  Housing: Low Risk  (04/23/2023)  Transportation Needs: No Transportation Needs (04/23/2023)  Utilities: Not At Risk (04/23/2023)  Depression (PHQ2-9): Low Risk  (03/09/2022)  Recent Concern: Depression (PHQ2-9) - Medium Risk (12/21/2021)  Tobacco Use: Low Risk  (04/23/2023)     Readmission Risk Interventions    08/30/2021   12:53 PM  Readmission Risk Prevention Plan  Post Dischage Appt Complete  Medication Screening Complete  Transportation Screening Complete

## 2023-04-25 NOTE — Discharge Summary (Signed)
[facility]  Physician Discharge Summary   Patient ID: Tony [name] MRN: [id number] DOB/AGE: 07/17/1950 72 y.o.  Admit date: 04/23/2023 Discharge date:   04/25/2023   PCP: Georgann Housekeeper, MD  DISCHARGE DIAGNOSES:    Acute metabolic encephalopathy   DM2 (diabetes mellitus, type 2) (HCC)   Acquired hypothyroidism   BPH (benign prostatic hyperplasia)   History of stroke   Influenza A   COVID-19 virus infection   Chronic combined systolic and diastolic CHF (congestive heart failure) (HCC)   Complete heart block (HCC)   Severe aortic stenosis   Hypertension associated with diabetes (HCC)   OSA on CPAP   S/P TAVR (transcatheter aortic valve replacement)   RECOMMENDATIONS FOR OUTPATIENT FOLLOW UP: Outpatient follow-up with PCP.  Consider referral to neurology for cognitive decline    Home Health: To be determined Equipment/Devices: To be determined  CODE STATUS: Full code  DISCHARGE CONDITION: fair  Diet recommendation: Modified carbohydrate  INITIAL HISTORY: 72 y.o. male with medical history significant for chronic combined systolic and diastolic CHF (EF 78-29%), CHB s/p PPM, severe AS s/p TAVR, history of CVA on Plavix, CKD stage IIIa, T2DM, HTN, HLD, hypothyroidism, BPH, OSA, mild cognitive impairment who presented to the ED for evaluation of altered mental status.  Patient lives at an independent living facility.  Apparently there has been a COVID-19 outbreak in that facility.  Patient was noted to be febrile.  He was positive for both COVID-19 as well as influenza A.  Hospitalized for further management.     Consultants: Neurology   Procedures: EEG  HOSPITAL COURSE:   Acute metabolic encephalopathy, resolved This is in the setting of acute influenza as well as COVID-19.  Does not have any focal neurological deficits.  CT head did not show any acute findings.  EEG is unremarkable. No concern for meningitis. Mentation seems to be back to baseline now.   Await PT and OT evaluation.   Influenza A Continue with Tamiflu.   COVID-19 infection No opacities noted on chest x-ray.  Patient mentions that he had COVID-19 earlier this year though he is not certain.  No clear indication to initiate COVID-19 specific treatments at this time.  No clear indication to give steroids either.  Would hesitate to give steroids to begin with considering his encephalopathy. Weaned off of oxygen   Chronic combined systolic and diastolic CHF LVEF noted to be 30 to 35%.  Continue with home medications   Chronic kidney disease stage IIIa Baseline creatinine seems to be between 1 and 1.5.     History of stroke Continue Plavix and statin.   Diabetes mellitus type 2 May resume home medications  Essential hypertension    Hypothyroidism Continue levothyroxine.  TSH 5.9.  Free T4 0.76.   Complete heart block status post pacemaker Paced rhythm noted.   History of severe aortic stenosis status post TAVR Stable.   Hyperlipidemia Continue statin.   History of BPH Continue Flomax.   History of vitamin B12 deficiency Continue cyanocobalamin.   OSA: Continue CPAP nightly.   Obesity Estimated body mass index is 31.9 kg/m as calculated from the following:   Height as of this encounter: 6' (1.829 m).   Weight as of this encounter: 106.7 kg.   Patient stable.  Okay for discharge after cleared by PT and OT.  PERTINENT LABS:  The results of significant diagnostics from this hospitalization (including imaging, microbiology, ancillary and laboratory) are listed below for reference.    Microbiology: Recent Results (from the  past 240 hours)  Resp panel by RT-PCR (RSV, Flu A&B, Covid) Anterior Nasal Swab     Status: Abnormal   Collection Time: 04/23/23  3:33 PM   Specimen: Anterior Nasal Swab  Result Value Ref Range Status   SARS Coronavirus 2 by RT PCR POSITIVE (A) NEGATIVE Final   Influenza A by PCR POSITIVE (A) NEGATIVE Final   Influenza B by PCR  NEGATIVE NEGATIVE Final    Comment: (NOTE) The Xpert Xpress SARS-CoV-2/FLU/RSV plus assay is intended as an aid in the diagnosis of influenza from Nasopharyngeal swab specimens and should not be used as a sole basis for treatment. Nasal washings and aspirates are unacceptable for Xpert Xpress SARS-CoV-2/FLU/RSV testing.  Fact Sheet for Patients: BloggerCourse.com  Fact Sheet for Healthcare Providers: SeriousBroker.it  This test is not yet approved or cleared by the [place] FDA and has been authorized for detection and/or diagnosis of SARS-CoV-2 by FDA under an Emergency Use Authorization (EUA). This EUA will remain in effect (meaning this test can be used) for the duration of the COVID-19 declaration under Section 564(b)(1) of the Act, 21 U.S.C. section 360bbb-3(b)(1), unless the authorization is terminated or revoked.     Resp Syncytial Virus by PCR NEGATIVE NEGATIVE Final    Comment: (NOTE) Fact Sheet for Patients: BloggerCourse.com  Fact Sheet for Healthcare Providers: SeriousBroker.it  This test is not yet approved or cleared by the [place] FDA and has been authorized for detection and/or diagnosis of SARS-CoV-2 by FDA under an Emergency Use Authorization (EUA). This EUA will remain in effect (meaning this test can be used) for the duration of the COVID-19 declaration under Section 564(b)(1) of the Act, 21 U.S.C. section 360bbb-3(b)(1), unless the authorization is terminated or revoked.  Performed at [facility], [address] [place], [place] [address]      Labs:   Basic Metabolic Panel: Recent Labs  Lab 04/23/23 1138 04/24/23 0429 04/25/23 0514  NA 136 136 139  K 4.3 3.8 3.9  CL 98 102 104  CO2 24 19* 24  GLUCOSE 194* 192* 148*  BUN 20 36* 43*  CREATININE 1.54* 1.62* 1.66*  CALCIUM 9.2 8.5* 8.3*   Liver Function Tests: Recent Labs   Lab 04/23/23 1138  AST 33  ALT 22  ALKPHOS 64  BILITOT 1.8*  PROT 7.7  ALBUMIN 4.1    Recent Labs  Lab 04/23/23 2224  AMMONIA 19   CBC: Recent Labs  Lab 04/23/23 1138 04/24/23 0429 04/25/23 0514  WBC 9.0 7.3 5.4  NEUTROABS 7.6  --   --   HGB 15.4 14.2 12.9*  HCT 47.5 43.6 39.8  MCV 92.2 92.4 93.4  PLT 150 130* 122*   CBG: Recent Labs  Lab 04/24/23 0805 04/24/23 1214 04/24/23 1607 04/24/23 2139 04/25/23 0853  GLUCAP 199* 197* 179* 394* 145*     IMAGING STUDIES EEG adult Result Date: 04/24/2023 Charlsie Quest, MD     04/24/2023  6:27 AM Patient Name: AXZEL RISTINE MRN: [id number] Epilepsy Attending: Charlsie Quest Referring Physician/Provider: Marjorie Smolder, NP Date: 02/22/2023 Duration: 22.26 mins Patient history: 72yo M with ams getting eeg to evaluate for seizure Level of alertness: Awake AEDs during EEG study: GBP Technical aspects: This EEG study was done with scalp electrodes positioned according to the 10-20 International system of electrode placement. Electrical activity was reviewed with band pass filter of 1-70Hz , sensitivity of 7 uV/mm, display speed of 10mm/sec with a 60Hz  notched filter applied as  appropriate. EEG data were recorded continuously and digitally stored.  Video monitoring was available and reviewed as appropriate. Description: The posterior dominant rhythm consists of 7 Hz activity of moderate voltage (25-35 uV) seen predominantly in posterior head regions, symmetric and reactive to eye opening and eye closing. EEG showed continuous generalized predominantly 5 to 7 Hz theta slowing. Hyperventilation and photic stimulation were not performed.   ABNORMALITY - Continuous slow, generalized IMPRESSION: This study is suggestive of mild to moderate diffuse encephalopathy. No seizures or epileptiform discharges were seen throughout the recording. Charlsie Quest   CT HEAD WO CONTRAST Result Date: 04/23/2023 CLINICAL DATA:  Mental status  change of unknown cause EXAM: CT HEAD WITHOUT CONTRAST TECHNIQUE: Contiguous axial images were obtained from the base of the skull through the vertex without intravenous contrast. RADIATION DOSE REDUCTION: This exam was performed according to the departmental dose-optimization program which includes automated exposure control, adjustment of the mA and/or kV according to patient size and/or use of iterative reconstruction technique. COMPARISON:  01/22/2023 FINDINGS: Brain: Age related volume loss. No focal abnormality affects the brainstem or cerebellum. Cerebral hemispheres show chronic small-vessel change of the white matter but no sign of acute or subacute infarction, mass lesion, hemorrhage, hydrocephalus or extra-axial collection. Ventricles are prominent, unchanged, inconsistent with central atrophy. Vascular: There is atherosclerotic calcification of the major vessels at the base of the brain. Skull: Negative Sinuses/Orbits: Clear/normal Other: None IMPRESSION: No acute CT finding. Age related volume loss. Chronic small-vessel change of the white matter. Ventricles are prominent, unchanged, inconsistent with central atrophy. Electronically Signed   By: Paulina Fusi M.D.   On: 04/23/2023 12:46   DG Chest Port 1 View Result Date: 04/23/2023 CLINICAL DATA:  Loss of consciousness. EXAM: PORTABLE CHEST 1 VIEW COMPARISON:  01/23/2023. FINDINGS: Low lung volume. There are probable atelectatic changes at the lung bases. There is apparent blunting of left lateral costophrenic angle which may be due to trace pleural effusion versus superimposed soft tissue. Bilateral lung fields are otherwise clear. Right lateral costophrenic angle is clear. Stable cardio-mediastinal silhouette. There is a left sided 2-lead pacemaker. Prosthetic aortic valve seen. No acute osseous abnormalities. The soft tissues are within normal limits. IMPRESSION: *Apparent blunting of left lateral costophrenic angle may be due to trace pleural  effusion versus superimposed soft tissue. *Bilateral lung fields are otherwise clear. No dense consolidation or lung collapse. Electronically Signed   By: Jules Schick M.D.   On: 04/23/2023 12:27    DISCHARGE EXAMINATION: Vitals:   04/24/23 0524 04/24/23 1600 04/24/23 1920 04/25/23 0854  BP:  129/64 125/73 (!) 148/87  Pulse:  74  63  Resp:  17 20 18   Temp:  98 F (36.7 C) 98 F (36.7 C) 98.5 F (36.9 C)  TempSrc:  Oral Oral Oral  SpO2:  95% 96% 94%  Weight: 106.7 kg     Height:       General appearance: Awake alert.  In no distress Resp: Clear to auscultation bilaterally.  Normal effort Cardio: S1-S2 is normal regular.  No S3-S4.  No rubs murmurs or bruit GI: Abdomen is soft.  Nontender nondistended.  Bowel sounds are present normal.  No masses organomegaly   DISPOSITION: Independent living facility  Discharge Instructions     (HEART FAILURE PATIENTS) Call MD:  Anytime you have any of the following symptoms: 1) 3 pound weight gain in 24 hours or 5 pounds in 1 week 2) shortness of breath, with or without a dry hacking cough 3)  swelling in the hands, feet or stomach 4) if you have to sleep on extra pillows at night in order to breathe.   Complete by: As directed    Call MD for:  difficulty breathing, headache or visual disturbances   Complete by: As directed    Call MD for:  extreme fatigue   Complete by: As directed    Call MD for:  persistant dizziness or light-headedness   Complete by: As directed    Call MD for:  persistant nausea and vomiting   Complete by: As directed    Call MD for:  severe uncontrolled pain   Complete by: As directed    Call MD for:  temperature >100.4   Complete by: As directed    Diet - low sodium heart healthy   Complete by: As directed    Discharge instructions   Complete by: As directed    Take your medications as prescribed.  Please talk to your primary care provider about referral to neurology for cognitive decline.  You were cared for  by a hospitalist during your hospital stay. If you have any questions about your discharge medications or the care you received while you were in the hospital after you are discharged, you can call the unit and asked to speak with the hospitalist on call if the hospitalist that took care of you is not available. Once you are discharged, your primary care physician will handle any further medical issues. Please note that NO REFILLS for any discharge medications will be authorized once you are discharged, as it is imperative that you return to your primary care physician (or establish a relationship with a primary care physician if you do not have one) for your aftercare needs so that they can reassess your need for medications and monitor your lab values. If you do not have a primary care physician, you can call [phone] for a physician referral.   Increase activity slowly   Complete by: As directed          Allergies as of 04/25/2023       Reactions   Codeine Itching   Crestor [rosuvastatin Calcium] Other (See Comments)   Leg pain   Metformin Hcl Diarrhea   Simvastatin Other (See Comments)   Leg pain        Medication List     STOP taking these medications    lidocaine 5 % Commonly known as: LIDODERM       TAKE these medications    atorvastatin 40 MG tablet Commonly known as: LIPITOR Take 40 mg by mouth every evening.   canagliflozin 300 MG Tabs tablet Commonly known as: INVOKANA Take 300 mg by mouth every evening.   carvedilol 6.25 MG tablet Commonly known as: COREG Take 6.25 mg by mouth 2 (two) times daily with a meal.   clopidogrel 75 MG tablet Commonly known as: PLAVIX Take 1 tablet (75 mg total) by mouth daily.   famotidine 20 MG tablet Commonly known as: PEPCID Take 1 tablet (20 mg total) by mouth daily. What changed: when to take this   gabapentin 100 MG capsule Commonly known as: NEURONTIN Take 1 capsule (100 mg total) by mouth at bedtime. What  changed: when to take this   Kerendia 10 MG Tabs Generic drug: Finerenone Take 1 tablet (10 mg total) by mouth daily.   levothyroxine 175 MCG tablet Commonly known as: SYNTHROID Take 1 tablet (175 mcg total) by mouth at bedtime. What changed: when to take  this   Melatonin 10 MG Tabs Take 10 mg by mouth at bedtime. What changed: how much to take   memantine 10 MG tablet Commonly known as: Namenda Take 1 tablet (10 mg total) by mouth 2 (two) times daily.   oseltamivir 30 MG capsule Commonly known as: TAMIFLU Take 1 capsule (30 mg total) by mouth 2 (two) times daily for 4 days.   sacubitril-valsartan 24-26 MG Commonly known as: ENTRESTO Take 1 tablet by mouth daily. What changed: when to take this   sertraline 100 MG tablet Commonly known as: ZOLOFT Take 2 tablets (200 mg total) by mouth at bedtime. What changed: when to take this   tamsulosin 0.4 MG Caps capsule Commonly known as: FLOMAX Take 1 capsule (0.4 mg total) by mouth every morning. What changed: when to take this   vitamin B-12 100 MCG tablet Commonly known as: CYANOCOBALAMIN Take 1 tablet (100 mcg total) by mouth daily.          Follow-up Information     Georgann Housekeeper, MD. Schedule an appointment as soon as possible for a visit in 1 week(s).   Specialty: Internal Medicine Why: post hospitalization follow up Contact information: [address] [place] [address] [phone]                 TOTAL DISCHARGE TIME: 35 minutes  Bo Teicher Rito Ehrlich  [facility] Pager on [email or web address]  04/25/2023, 10:00 AM

## 2023-04-25 NOTE — Evaluation (Signed)
Occupational Therapy Evaluation Patient Details Name: Tony [name] MRN: [id number] DOB: 04-19-1951 Today's Date: 04/25/2023   History of Present Illness 72 y.o. male who presented to the ED for evaluation of altered mental status.  Patient lives at an independent living facility.  Apparently there has been a COVID-19 outbreak in that facility.  Patient was noted to be febrile.  He was positive for both COVID-19 as well as influenza A. Past medical history significant for chronic combined systolic and diastolic CHF (EF 13-24%), CHB s/p PPM, severe AS s/p TAVR, history of CVA on Plavix, CKD stage IIIa, T2DM, HTN, HLD, hypothyroidism, BPH, OSA, mild cognitive impairment.   Clinical Impression   Pt admitted for above, he was initially needing increased assist with ambulation and transfers ranging from min to mod A, throughout session progressed to CGA overall and demonstrated need for min to setup assist for ADLs. Some balance deficits lingered causing posterior LOBs. Discussed with pt and team that pt may need another day or so in acute stay to progress home safely without assist, family reports they would prefer pt to DC back to ILF with support of family. OT to continue following pt acutely to help transition to next level of care, recommend pt return to post acute [facility] services at his admitting ILF.        If plan is discharge home, recommend the following: Assistance with cooking/housework;A little help with walking and/or transfers;A little help with bathing/dressing/bathroom    Functional Status Assessment  Patient has had a recent decline in their functional status and demonstrates the ability to make significant improvements in function in a reasonable and predictable amount of time.  Equipment Recommendations  None recommended by OT (Pt has rec DME)    Recommendations for Other Services       Precautions / Restrictions Precautions Precautions: Fall;Other (comment) Precaution  Comments: Covid Restrictions Weight Bearing Restrictions Per Provider Order: No      Mobility Bed Mobility Overal bed mobility: Needs Assistance Bed Mobility: Supine to Sit     Supine to sit: Min assist     General bed mobility comments: Pt reliant on momentum and required multiple attempts to sit up. Pt had several posterior and L lateral losses of balance.    Transfers Overall transfer level: Needs assistance Equipment used: Rolling walker (2 wheels) Transfers: Sit to/from Stand Sit to Stand: Mod assist, Min assist           General transfer comment: Cues for hand placement. Initial sit to stand required Mod A to stand however pt immediately had to sit down. Next attempt required Min A to power up.      Balance Overall balance assessment: Needs assistance Sitting-balance support: Bilateral upper extremity supported, Feet supported Sitting balance-Leahy Scale: Poor Sitting balance - Comments: Several posterior and L lateral losses of balance. Postural control: Posterior lean, Left lateral lean Standing balance support: Bilateral upper extremity supported, During functional activity Standing balance-Leahy Scale: Poor                             ADL either performed or assessed with clinical judgement   ADL Overall ADL's : Needs assistance/impaired Eating/Feeding: Independent;Sitting   Grooming: Standing;Contact guard assist   Upper Body Bathing: Sitting;Set up   Lower Body Bathing: Sitting/lateral leans;Set up   Upper Body Dressing : Sitting;Set up   Lower Body Dressing: Sitting/lateral leans;Minimal assistance   Toilet Transfer: Contact guard assist;Rolling walker (  2 wheels);BSC/3in1 Toilet Transfer Details (indicate cue type and reason): BSC over toilet Toileting- Clothing Manipulation and Hygiene: Contact guard assist;Sitting/lateral lean       Functional mobility during ADLs: Contact guard assist;Rolling walker (2 wheels) General ADL  Comments: Pt initially needing min A with intermittent Posterior LOBs, cues to tuck hips in and chest up. Progressing throughout session to [facility]     Vision         Perception         Praxis         Pertinent Vitals/Pain Pain Assessment Pain Assessment: No/denies pain     Extremity/Trunk Assessment Upper Extremity Assessment Upper Extremity Assessment: Overall WFL for tasks assessed   Lower Extremity Assessment Lower Extremity Assessment: Generalized weakness   Cervical / Trunk Assessment Cervical / Trunk Assessment: Normal   Communication Communication Communication: No apparent difficulties Cueing Techniques: Verbal cues;Tactile cues   Cognition Arousal: Alert Behavior During Therapy: WFL for tasks assessed/performed Overall Cognitive Status: No family/caregiver present to determine baseline cognitive functioning                                 General Comments: A&0x3, reports it is the year 2025. Pt jokingly reports he is just trying to get over this current year.     General Comments  VSS on RA. Discussed with rehab team the potential need for one or two more days of rehab to allow pt to progress to baseline but family reports they would like pt to DC back to ILF and have assist with support of family    Exercises     Shoulder Instructions      Home Living Family/patient expects to be discharged to:: Other (Comment) (ILF-[facility] [facility])                                 Additional Comments: uses elevator, gets to/from with RW. Pt has DME at home through facility. Bathroom has grab bars, seat, and walkin shower. Pt has 4 falls in the last year whcih led to his transfer into ILF      Prior Functioning/Environment Prior Level of Function : Independent/Modified Independent;History of Falls (last six months)             Mobility Comments: ambulates with RW ADLs Comments: ind, gets meals at facility        OT Problem List:  Impaired balance (sitting and/or standing);Decreased strength      OT Treatment/Interventions: Self-care/ADL training;Therapeutic exercise;Patient/family education;Therapeutic activities;DME and/or AE instruction    OT Goals(Current goals can be found in the care plan section) Acute Rehab OT Goals Patient Stated Goal: to go home OT Goal Formulation: With patient Time For Goal Achievement: 05/09/23 Potential to Achieve Goals: Good ADL Goals Pt Will Perform Grooming: with modified independence;standing Pt Will Perform Lower Body Bathing: with modified independence;sitting/lateral leans Pt Will Perform Lower Body Dressing: with modified independence;sit to/from stand Pt Will Transfer to Toilet: with modified independence;ambulating  OT Frequency: Min 1X/week    Co-evaluation              AM-PAC OT "6 Clicks" Daily Activity     Outcome Measure Help from another person eating meals?: None Help from another person taking care of personal grooming?: A Little Help from another person toileting, which includes using toliet, bedpan, or urinal?: A Little Help from another  person bathing (including washing, rinsing, drying)?: A Little Help from another person to put on and taking off regular upper body clothing?: A Little Help from another person to put on and taking off regular lower body clothing?: A Little 6 Click Score: 19   End of Session Equipment Utilized During Treatment: Gait belt;Rolling walker (2 wheels) Nurse Communication: Mobility status  Activity Tolerance: Patient tolerated treatment well Patient left: in chair;with call bell/phone within reach  OT Visit Diagnosis: Unsteadiness on feet (R26.81);Other abnormalities of gait and mobility (R26.89);Other symptoms and signs involving cognitive function                Time: 9629-5284 OT Time Calculation (min): 35 min Charges:  OT General Charges $OT Visit: 1 Visit OT Evaluation $OT Eval Moderate Complexity: 1  Mod  04/25/2023  AB, OTR/L  Acute Rehabilitation Services  Office: [phone]   Tristan Schroeder 04/25/2023, 11:31 AM

## 2023-04-25 NOTE — TOC Initial Note (Signed)
Transition of Care [facility]) - Initial/Assessment Note    Patient Details  Name: Tony [name] MRN: [id number] Date of Birth: May 02, 1950  [facility]) CM/SW Contact:    Mearl Latin, LCSW Phone Number: 04/25/2023, 10:10 AM  Clinical Narrative:                 CSW received SNF consult from therapy. CSW spoke with patient's daughter who stated she feels that patient is just weak from being sick and will do fine at home. He resides at [facility] and has his meals delivered and medication management and does therapies through [facility] there. CSW discussed current SNF recommendation and she reported that she spoke with patient and he would like to return home and not go to SNF again as he spent 11 days there in September. Daughter reported patient's son will pick the patient up today at 1:45pm. CSW notified team of plan. Home Health orders need to be faxed to [facility].   Expected Discharge Plan: Home w Home Health Services Barriers to Discharge: No Barriers Identified   Patient Goals and CMS Choice Patient states their goals for this hospitalization and ongoing recovery are:: Return home CMS Medicare.gov Compare Post Acute Care list provided to:: Patient Choice offered to / list presented to : Patient, Adult Children [place] ownership interest in [facility].provided to:: Adult Children    Expected Discharge Plan and Services   Discharge Planning Services: CM Consult Post Acute Care Choice: Home Health Living arrangements for the past 2 months: Independent Living Facility [facility]) Expected Discharge Date: 04/25/23                                    Prior Living Arrangements/Services Living arrangements for the past 2 months: Independent Living Facility [facility]) Lives with:: Self Patient language and need for interpreter reviewed:: Yes Do you feel safe going back to the place where you live?: Yes      Need for  Family Participation in Patient Care: Yes (Comment) Care giver support system in place?: Yes (comment) Current home services: DME (walker; therapy through [facility]) Criminal Activity/Legal Involvement Pertinent to Current Situation/Hospitalization: No - Comment as needed  Activities of Daily Living   ADL Screening (condition at time of admission) Independently performs ADLs?: Yes (appropriate for developmental age) Is the patient deaf or have difficulty hearing?: No Does the patient have difficulty seeing, even when wearing glasses/contacts?: No Does the patient have difficulty concentrating, remembering, or making decisions?: No  Permission Sought/Granted Permission sought to share information with : Facility Medical sales representative, Family Supports Permission granted to share information with : Yes, Verbal Permission Granted  Share Information with NAME: Megan/Weston  Permission granted to share info w AGENCY: [facility]  Permission granted to share info w Relationship: Daughter/Son  Permission granted to share info w Contact Information: [phone]  Emotional Assessment Appearance:: Appears stated age   Affect (typically observed): Unable to Assess (COVID+)   Alcohol / Substance Use: Not Applicable Psych Involvement: No (comment)  Admission diagnosis:  Delirium [R41.0] Influenza A [J10.1] Acute metabolic encephalopathy [G93.41] COVID [U07.1] Patient Active Problem List   Diagnosis Date Noted   Acute metabolic encephalopathy 04/23/2023   Influenza A 04/23/2023   COVID-19 virus infection 04/23/2023   Acute hyperactive delirium due to multiple etiologies 01/23/2023   Frequent falls 01/23/2023   SIRS (systemic inflammatory response syndrome) (HCC) 01/23/2023  BPH (benign prostatic hyperplasia) 01/23/2023   History of stroke 01/23/2023   Constipation 01/23/2023   B12 deficiency 01/23/2023   Multiple rib fractures 01/22/2023   OSA (obstructive sleep apnea) 05/31/2022    Cerebrovascular accident (CVA) (HCC) 05/31/2022   Mild cognitive impairment 05/31/2022   Gait abnormality 05/31/2022   Acute stroke due to ischemia (HCC) 03/22/2022   Aortic valve disorder 09/07/2021   Diabetic renal disease (HCC) 09/07/2021   Fatty liver 09/07/2021   GERD (gastroesophageal reflux disease) 09/07/2021   Heart block 09/07/2021   Hypercholesterolemia 09/07/2021   Mixed hyperlipidemia 09/07/2021   Hyperglycemia due to type 2 diabetes mellitus (HCC) 09/07/2021   Long term (current) use of insulin (HCC) 09/07/2021   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 associated with type 2 diabetes mellitus (HCC) 09/07/2021   Mitral regurgitation 09/07/2021   Moderate recurrent major depression (HCC) 09/07/2021   Obesity 09/07/2021   Severe recurrent major depression without psychotic features (HCC) 09/07/2021   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   Chronic combined systolic and diastolic CHF (congestive heart failure) (HCC) 11/04/2019   Pacemaker 11/04/2019   Ganglion cyst 12/19/2015   Severe aortic stenosis 09/01/2014   Hypertension associated with diabetes (HCC) 09/01/2014   DM2 (diabetes mellitus, type 2) (HCC) 09/01/2014   Acquired hypothyroidism 09/01/2014   OSA on CPAP 09/01/2014   Complete heart block (HCC) 08/05/2014   Lumbar stenosis 07/16/2011   HNP (herniated nucleus pulposus), lumbar 07/16/2011   PCP:  Georgann Housekeeper, MD Pharmacy:   [id number] [place], [place] - [address] AT [facility] [address] [place] [address] Phone: [phone] Fax: [phone]     Social Drivers of Health (SDOH) Social History: SDOH Screenings   Food Insecurity: No Food Insecurity (04/23/2023)  Housing: Low Risk  (04/23/2023)  Transportation Needs: No Transportation Needs (04/23/2023)  Utilities: Not At Risk (04/23/2023)  Depression (PHQ2-9): Low Risk   (03/09/2022)  Recent Concern: Depression (PHQ2-9) - Medium Risk (12/21/2021)  Tobacco Use: Low Risk  (04/23/2023)   SDOH Interventions:     Readmission Risk Interventions    08/30/2021   12:53 PM  Readmission Risk Prevention Plan  Post Dischage Appt Complete  Medication Screening Complete  Transportation Screening Complete

## 2023-04-25 NOTE — Plan of Care (Signed)
  Problem: Education: Goal: Ability to describe self-care measures that may prevent or decrease complications (Diabetes Survival Skills Education) will improve Outcome: Completed/Met Goal: Individualized Educational Video(s) Outcome: Completed/Met   Problem: Coping: Goal: Ability to adjust to condition or change in health will improve Outcome: Completed/Met   Problem: Fluid Volume: Goal: Ability to maintain a balanced intake and output will improve Outcome: Completed/Met   Problem: Health Behavior/Discharge Planning: Goal: Ability to identify and utilize available resources and services will improve Outcome: Completed/Met Goal: Ability to manage health-related needs will improve Outcome: Completed/Met   Problem: Metabolic: Goal: Ability to maintain appropriate glucose levels will improve Outcome: Completed/Met   Problem: Nutritional: Goal: Maintenance of adequate nutrition will improve Outcome: Completed/Met Goal: Progress toward achieving an optimal weight will improve Outcome: Completed/Met   Problem: Skin Integrity: Goal: Risk for impaired skin integrity will decrease Outcome: Completed/Met   Problem: Tissue Perfusion: Goal: Adequacy of tissue perfusion will improve Outcome: Completed/Met   Problem: Education: Goal: Knowledge of risk factors and measures for prevention of condition will improve Outcome: Completed/Met   Problem: Coping: Goal: Psychosocial and spiritual needs will be supported Outcome: Completed/Met   Problem: Respiratory: Goal: Will maintain a patent airway Outcome: Completed/Met Goal: Complications related to the disease process, condition or treatment will be avoided or minimized Outcome: Completed/Met   Problem: Education: Goal: Knowledge of General Education information will improve Description: Including pain rating scale, medication(s)/side effects and non-pharmacologic comfort measures Outcome: Completed/Met   Problem: Health  Behavior/Discharge Planning: Goal: Ability to manage health-related needs will improve Outcome: Completed/Met   Problem: Clinical Measurements: Goal: Ability to maintain clinical measurements within normal limits will improve Outcome: Completed/Met Goal: Will remain free from infection Outcome: Completed/Met Goal: Diagnostic test results will improve Outcome: Completed/Met Goal: Respiratory complications will improve Outcome: Completed/Met Goal: Cardiovascular complication will be avoided Outcome: Completed/Met   Problem: Activity: Goal: Risk for activity intolerance will decrease Outcome: Completed/Met   Problem: Nutrition: Goal: Adequate nutrition will be maintained Outcome: Completed/Met   Problem: Coping: Goal: Level of anxiety will decrease Outcome: Completed/Met   Problem: Elimination: Goal: Will not experience complications related to bowel motility Outcome: Completed/Met Goal: Will not experience complications related to urinary retention Outcome: Completed/Met   Problem: Pain Management: Goal: General experience of comfort will improve Outcome: Completed/Met   Problem: Safety: Goal: Ability to remain free from injury will improve Outcome: Completed/Met   Problem: Skin Integrity: Goal: Risk for impaired skin integrity will decrease Outcome: Completed/Met

## 2023-04-28 LAB — VITAMIN B1: Vitamin B1 (Thiamine): 171.2 nmol/L (ref 66.5–200.0)

## 2023-04-28 LAB — VITAMIN B6: Vitamin B6: 32.5 ug/L (ref 3.4–65.2)

## 2023-04-30 DIAGNOSIS — R2689 Other abnormalities of gait and mobility: Secondary | ICD-10-CM | POA: Diagnosis not present

## 2023-04-30 DIAGNOSIS — R2681 Unsteadiness on feet: Secondary | ICD-10-CM | POA: Diagnosis not present

## 2023-04-30 DIAGNOSIS — R296 Repeated falls: Secondary | ICD-10-CM | POA: Diagnosis not present

## 2023-05-02 DIAGNOSIS — R296 Repeated falls: Secondary | ICD-10-CM | POA: Diagnosis not present

## 2023-05-02 DIAGNOSIS — R2681 Unsteadiness on feet: Secondary | ICD-10-CM | POA: Diagnosis not present

## 2023-05-03 DIAGNOSIS — R2681 Unsteadiness on feet: Secondary | ICD-10-CM | POA: Diagnosis not present

## 2023-05-03 DIAGNOSIS — R296 Repeated falls: Secondary | ICD-10-CM | POA: Diagnosis not present

## 2023-05-03 DIAGNOSIS — R2689 Other abnormalities of gait and mobility: Secondary | ICD-10-CM | POA: Diagnosis not present

## 2023-05-07 DIAGNOSIS — R2689 Other abnormalities of gait and mobility: Secondary | ICD-10-CM | POA: Diagnosis not present

## 2023-05-07 DIAGNOSIS — R2681 Unsteadiness on feet: Secondary | ICD-10-CM | POA: Diagnosis not present

## 2023-05-07 DIAGNOSIS — R296 Repeated falls: Secondary | ICD-10-CM | POA: Diagnosis not present

## 2023-05-09 DIAGNOSIS — R296 Repeated falls: Secondary | ICD-10-CM | POA: Diagnosis not present

## 2023-05-09 DIAGNOSIS — R2681 Unsteadiness on feet: Secondary | ICD-10-CM | POA: Diagnosis not present

## 2023-05-09 DIAGNOSIS — R2689 Other abnormalities of gait and mobility: Secondary | ICD-10-CM | POA: Diagnosis not present

## 2023-05-10 DIAGNOSIS — R2689 Other abnormalities of gait and mobility: Secondary | ICD-10-CM | POA: Diagnosis not present

## 2023-05-10 DIAGNOSIS — R2681 Unsteadiness on feet: Secondary | ICD-10-CM | POA: Diagnosis not present

## 2023-05-10 DIAGNOSIS — R296 Repeated falls: Secondary | ICD-10-CM | POA: Diagnosis not present

## 2023-05-14 DIAGNOSIS — R2689 Other abnormalities of gait and mobility: Secondary | ICD-10-CM | POA: Diagnosis not present

## 2023-05-14 DIAGNOSIS — R2681 Unsteadiness on feet: Secondary | ICD-10-CM | POA: Diagnosis not present

## 2023-05-14 DIAGNOSIS — R296 Repeated falls: Secondary | ICD-10-CM | POA: Diagnosis not present

## 2023-05-15 DIAGNOSIS — R296 Repeated falls: Secondary | ICD-10-CM | POA: Diagnosis not present

## 2023-05-15 DIAGNOSIS — R2681 Unsteadiness on feet: Secondary | ICD-10-CM | POA: Diagnosis not present

## 2023-05-15 DIAGNOSIS — R2689 Other abnormalities of gait and mobility: Secondary | ICD-10-CM | POA: Diagnosis not present

## 2023-05-21 DIAGNOSIS — R2681 Unsteadiness on feet: Secondary | ICD-10-CM | POA: Diagnosis not present

## 2023-05-21 DIAGNOSIS — R2689 Other abnormalities of gait and mobility: Secondary | ICD-10-CM | POA: Diagnosis not present

## 2023-05-21 DIAGNOSIS — R296 Repeated falls: Secondary | ICD-10-CM | POA: Diagnosis not present

## 2023-05-22 DIAGNOSIS — R2681 Unsteadiness on feet: Secondary | ICD-10-CM | POA: Diagnosis not present

## 2023-05-22 DIAGNOSIS — R296 Repeated falls: Secondary | ICD-10-CM | POA: Diagnosis not present

## 2023-05-24 DIAGNOSIS — R2689 Other abnormalities of gait and mobility: Secondary | ICD-10-CM | POA: Diagnosis not present

## 2023-05-24 DIAGNOSIS — R2681 Unsteadiness on feet: Secondary | ICD-10-CM | POA: Diagnosis not present

## 2023-05-24 DIAGNOSIS — R296 Repeated falls: Secondary | ICD-10-CM | POA: Diagnosis not present

## 2023-05-27 DIAGNOSIS — R2681 Unsteadiness on feet: Secondary | ICD-10-CM | POA: Diagnosis not present

## 2023-05-27 DIAGNOSIS — R296 Repeated falls: Secondary | ICD-10-CM | POA: Diagnosis not present

## 2023-05-28 DIAGNOSIS — R296 Repeated falls: Secondary | ICD-10-CM | POA: Diagnosis not present

## 2023-05-28 DIAGNOSIS — R2689 Other abnormalities of gait and mobility: Secondary | ICD-10-CM | POA: Diagnosis not present

## 2023-05-28 DIAGNOSIS — R2681 Unsteadiness on feet: Secondary | ICD-10-CM | POA: Diagnosis not present

## 2023-05-29 DIAGNOSIS — R296 Repeated falls: Secondary | ICD-10-CM | POA: Diagnosis not present

## 2023-05-29 DIAGNOSIS — R2681 Unsteadiness on feet: Secondary | ICD-10-CM | POA: Diagnosis not present

## 2023-05-30 DIAGNOSIS — R296 Repeated falls: Secondary | ICD-10-CM | POA: Diagnosis not present

## 2023-05-30 DIAGNOSIS — R2681 Unsteadiness on feet: Secondary | ICD-10-CM | POA: Diagnosis not present

## 2023-05-30 DIAGNOSIS — R2689 Other abnormalities of gait and mobility: Secondary | ICD-10-CM | POA: Diagnosis not present

## 2023-06-03 DIAGNOSIS — R296 Repeated falls: Secondary | ICD-10-CM | POA: Diagnosis not present

## 2023-06-03 DIAGNOSIS — R2681 Unsteadiness on feet: Secondary | ICD-10-CM | POA: Diagnosis not present

## 2023-06-04 DIAGNOSIS — R296 Repeated falls: Secondary | ICD-10-CM | POA: Diagnosis not present

## 2023-06-04 DIAGNOSIS — R2689 Other abnormalities of gait and mobility: Secondary | ICD-10-CM | POA: Diagnosis not present

## 2023-06-04 DIAGNOSIS — R2681 Unsteadiness on feet: Secondary | ICD-10-CM | POA: Diagnosis not present

## 2023-06-06 DIAGNOSIS — R296 Repeated falls: Secondary | ICD-10-CM | POA: Diagnosis not present

## 2023-06-06 DIAGNOSIS — R2681 Unsteadiness on feet: Secondary | ICD-10-CM | POA: Diagnosis not present

## 2023-06-06 DIAGNOSIS — R2689 Other abnormalities of gait and mobility: Secondary | ICD-10-CM | POA: Diagnosis not present
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
## 2023-06-12 ENCOUNTER — Encounter: Payer: Self-pay | Admitting: Adult Health

## 2023-06-12 ENCOUNTER — Ambulatory Visit: Payer: Medicare PPO | Admitting: Adult Health

## 2023-06-12 DIAGNOSIS — R296 Repeated falls: Secondary | ICD-10-CM | POA: Diagnosis not present

## 2023-06-12 DIAGNOSIS — R2681 Unsteadiness on feet: Secondary | ICD-10-CM | POA: Diagnosis not present

## 2023-06-25 DIAGNOSIS — R488 Other symbolic dysfunctions: Secondary | ICD-10-CM | POA: Diagnosis not present

## 2023-06-25 DIAGNOSIS — R4789 Other speech disturbances: Secondary | ICD-10-CM | POA: Diagnosis not present

## 2023-06-27 DIAGNOSIS — R4789 Other speech disturbances: Secondary | ICD-10-CM | POA: Diagnosis not present

## 2023-06-27 DIAGNOSIS — R488 Other symbolic dysfunctions: Secondary | ICD-10-CM | POA: Diagnosis not present

## 2023-07-02 DIAGNOSIS — R488 Other symbolic dysfunctions: Secondary | ICD-10-CM | POA: Diagnosis not present

## 2023-07-02 DIAGNOSIS — R4789 Other speech disturbances: Secondary | ICD-10-CM | POA: Diagnosis not present

## 2023-07-04 DIAGNOSIS — R488 Other symbolic dysfunctions: Secondary | ICD-10-CM | POA: Diagnosis not present

## 2023-07-04 DIAGNOSIS — R4789 Other speech disturbances: Secondary | ICD-10-CM | POA: Diagnosis not present

## 2023-07-09 DIAGNOSIS — R4789 Other speech disturbances: Secondary | ICD-10-CM | POA: Diagnosis not present

## 2023-07-09 DIAGNOSIS — R488 Other symbolic dysfunctions: Secondary | ICD-10-CM | POA: Diagnosis not present

## 2023-07-11 DIAGNOSIS — R488 Other symbolic dysfunctions: Secondary | ICD-10-CM | POA: Diagnosis not present

## 2023-07-11 DIAGNOSIS — R4789 Other speech disturbances: Secondary | ICD-10-CM | POA: Diagnosis not present

## 2023-07-16 DIAGNOSIS — R488 Other symbolic dysfunctions: Secondary | ICD-10-CM | POA: Diagnosis not present

## 2023-07-16 DIAGNOSIS — R4789 Other speech disturbances: Secondary | ICD-10-CM | POA: Diagnosis not present

## 2023-07-18 DIAGNOSIS — R4789 Other speech disturbances: Secondary | ICD-10-CM | POA: Diagnosis not present

## 2023-07-18 DIAGNOSIS — R488 Other symbolic dysfunctions: Secondary | ICD-10-CM | POA: Diagnosis not present

## 2023-07-23 DIAGNOSIS — R488 Other symbolic dysfunctions: Secondary | ICD-10-CM | POA: Diagnosis not present

## 2023-07-23 DIAGNOSIS — R4789 Other speech disturbances: Secondary | ICD-10-CM | POA: Diagnosis not present

## 2023-07-25 DIAGNOSIS — R488 Other symbolic dysfunctions: Secondary | ICD-10-CM | POA: Diagnosis not present

## 2023-07-25 DIAGNOSIS — R4789 Other speech disturbances: Secondary | ICD-10-CM | POA: Diagnosis not present

## 2023-07-29 DIAGNOSIS — F0150 Vascular dementia without behavioral disturbance: Secondary | ICD-10-CM | POA: Diagnosis not present

## 2023-07-29 DIAGNOSIS — I639 Cerebral infarction, unspecified: Secondary | ICD-10-CM | POA: Diagnosis not present

## 2023-07-29 DIAGNOSIS — E039 Hypothyroidism, unspecified: Secondary | ICD-10-CM | POA: Diagnosis not present

## 2023-07-29 DIAGNOSIS — E119 Type 2 diabetes mellitus without complications: Secondary | ICD-10-CM | POA: Diagnosis not present

## 2023-07-30 DIAGNOSIS — R488 Other symbolic dysfunctions: Secondary | ICD-10-CM | POA: Diagnosis not present

## 2023-07-30 DIAGNOSIS — R4789 Other speech disturbances: Secondary | ICD-10-CM | POA: Diagnosis not present

## 2023-08-01 DIAGNOSIS — R488 Other symbolic dysfunctions: Secondary | ICD-10-CM | POA: Diagnosis not present

## 2023-08-01 DIAGNOSIS — R4789 Other speech disturbances: Secondary | ICD-10-CM | POA: Diagnosis not present

## 2023-08-06 DIAGNOSIS — R488 Other symbolic dysfunctions: Secondary | ICD-10-CM | POA: Diagnosis not present

## 2023-08-06 DIAGNOSIS — R4789 Other speech disturbances: Secondary | ICD-10-CM | POA: Diagnosis not present

## 2023-08-08 DIAGNOSIS — R4789 Other speech disturbances: Secondary | ICD-10-CM | POA: Diagnosis not present

## 2023-08-08 DIAGNOSIS — R488 Other symbolic dysfunctions: Secondary | ICD-10-CM | POA: Diagnosis not present

## 2023-08-13 DIAGNOSIS — E1165 Type 2 diabetes mellitus with hyperglycemia: Secondary | ICD-10-CM | POA: Diagnosis not present

## 2023-08-13 DIAGNOSIS — Z794 Long term (current) use of insulin: Secondary | ICD-10-CM | POA: Diagnosis not present

## 2023-08-13 DIAGNOSIS — G4733 Obstructive sleep apnea (adult) (pediatric): Secondary | ICD-10-CM | POA: Diagnosis not present

## 2023-08-13 DIAGNOSIS — F331 Major depressive disorder, recurrent, moderate: Secondary | ICD-10-CM | POA: Diagnosis not present

## 2023-08-13 DIAGNOSIS — E782 Mixed hyperlipidemia: Secondary | ICD-10-CM | POA: Diagnosis not present

## 2023-08-13 DIAGNOSIS — R4789 Other speech disturbances: Secondary | ICD-10-CM | POA: Diagnosis not present

## 2023-08-13 DIAGNOSIS — E039 Hypothyroidism, unspecified: Secondary | ICD-10-CM | POA: Diagnosis not present

## 2023-08-13 DIAGNOSIS — N1831 Chronic kidney disease, stage 3a: Secondary | ICD-10-CM | POA: Diagnosis not present

## 2023-08-13 DIAGNOSIS — R488 Other symbolic dysfunctions: Secondary | ICD-10-CM | POA: Diagnosis not present

## 2023-08-13 DIAGNOSIS — I5022 Chronic systolic (congestive) heart failure: Secondary | ICD-10-CM | POA: Diagnosis not present

## 2023-08-13 DIAGNOSIS — F0150 Vascular dementia without behavioral disturbance: Secondary | ICD-10-CM | POA: Diagnosis not present

## 2023-08-13 DIAGNOSIS — E538 Deficiency of other specified B group vitamins: Secondary | ICD-10-CM | POA: Diagnosis not present

## 2023-08-13 DIAGNOSIS — E113291 Type 2 diabetes mellitus with mild nonproliferative diabetic retinopathy without macular edema, right eye: Secondary | ICD-10-CM | POA: Diagnosis not present

## 2023-08-15 DIAGNOSIS — R488 Other symbolic dysfunctions: Secondary | ICD-10-CM | POA: Diagnosis not present

## 2023-08-15 DIAGNOSIS — R4789 Other speech disturbances: Secondary | ICD-10-CM | POA: Diagnosis not present

## 2023-08-20 DIAGNOSIS — R488 Other symbolic dysfunctions: Secondary | ICD-10-CM | POA: Diagnosis not present

## 2023-08-20 DIAGNOSIS — R4789 Other speech disturbances: Secondary | ICD-10-CM | POA: Diagnosis not present

## 2023-08-22 DIAGNOSIS — R4789 Other speech disturbances: Secondary | ICD-10-CM | POA: Diagnosis not present

## 2023-08-22 DIAGNOSIS — R488 Other symbolic dysfunctions: Secondary | ICD-10-CM | POA: Diagnosis not present

## 2023-08-27 DIAGNOSIS — R488 Other symbolic dysfunctions: Secondary | ICD-10-CM | POA: Diagnosis not present

## 2023-08-27 DIAGNOSIS — R4789 Other speech disturbances: Secondary | ICD-10-CM | POA: Diagnosis not present

## 2023-08-28 DIAGNOSIS — F0150 Vascular dementia without behavioral disturbance: Secondary | ICD-10-CM | POA: Diagnosis not present

## 2023-08-28 DIAGNOSIS — E119 Type 2 diabetes mellitus without complications: Secondary | ICD-10-CM | POA: Diagnosis not present

## 2023-08-28 DIAGNOSIS — I639 Cerebral infarction, unspecified: Secondary | ICD-10-CM | POA: Diagnosis not present

## 2023-08-28 DIAGNOSIS — E039 Hypothyroidism, unspecified: Secondary | ICD-10-CM | POA: Diagnosis not present

## 2023-09-03 DIAGNOSIS — R488 Other symbolic dysfunctions: Secondary | ICD-10-CM | POA: Diagnosis not present

## 2023-09-03 DIAGNOSIS — R4789 Other speech disturbances: Secondary | ICD-10-CM | POA: Diagnosis not present

## 2023-09-10 DIAGNOSIS — R488 Other symbolic dysfunctions: Secondary | ICD-10-CM | POA: Diagnosis not present

## 2023-09-10 DIAGNOSIS — R4789 Other speech disturbances: Secondary | ICD-10-CM | POA: Diagnosis not present

## 2023-09-17 DIAGNOSIS — R488 Other symbolic dysfunctions: Secondary | ICD-10-CM | POA: Diagnosis not present

## 2023-09-17 DIAGNOSIS — R4789 Other speech disturbances: Secondary | ICD-10-CM | POA: Diagnosis not present

## 2023-09-27 DIAGNOSIS — M6281 Muscle weakness (generalized): Secondary | ICD-10-CM | POA: Diagnosis not present

## 2023-09-27 DIAGNOSIS — R2689 Other abnormalities of gait and mobility: Secondary | ICD-10-CM | POA: Diagnosis not present

## 2023-09-28 DIAGNOSIS — E119 Type 2 diabetes mellitus without complications: Secondary | ICD-10-CM | POA: Diagnosis not present

## 2023-09-28 DIAGNOSIS — I639 Cerebral infarction, unspecified: Secondary | ICD-10-CM | POA: Diagnosis not present

## 2023-09-28 DIAGNOSIS — F0150 Vascular dementia without behavioral disturbance: Secondary | ICD-10-CM | POA: Diagnosis not present

## 2023-09-28 DIAGNOSIS — E039 Hypothyroidism, unspecified: Secondary | ICD-10-CM | POA: Diagnosis not present

## 2023-10-02 DIAGNOSIS — M6281 Muscle weakness (generalized): Secondary | ICD-10-CM | POA: Diagnosis not present

## 2023-10-02 DIAGNOSIS — R2689 Other abnormalities of gait and mobility: Secondary | ICD-10-CM | POA: Diagnosis not present

## 2023-10-07 DIAGNOSIS — R2689 Other abnormalities of gait and mobility: Secondary | ICD-10-CM | POA: Diagnosis not present

## 2023-10-07 DIAGNOSIS — M6281 Muscle weakness (generalized): Secondary | ICD-10-CM | POA: Diagnosis not present

## 2023-10-11 DIAGNOSIS — M6281 Muscle weakness (generalized): Secondary | ICD-10-CM | POA: Diagnosis not present

## 2023-10-11 DIAGNOSIS — R2689 Other abnormalities of gait and mobility: Secondary | ICD-10-CM | POA: Diagnosis not present

## 2023-10-14 DIAGNOSIS — M6281 Muscle weakness (generalized): Secondary | ICD-10-CM | POA: Diagnosis not present

## 2023-10-14 DIAGNOSIS — R2689 Other abnormalities of gait and mobility: Secondary | ICD-10-CM | POA: Diagnosis not present

## 2023-10-17 DIAGNOSIS — M6281 Muscle weakness (generalized): Secondary | ICD-10-CM | POA: Diagnosis not present

## 2023-10-17 DIAGNOSIS — R2689 Other abnormalities of gait and mobility: Secondary | ICD-10-CM | POA: Diagnosis not present

## 2023-10-21 DIAGNOSIS — R2689 Other abnormalities of gait and mobility: Secondary | ICD-10-CM | POA: Diagnosis not present

## 2023-10-21 DIAGNOSIS — M6281 Muscle weakness (generalized): Secondary | ICD-10-CM | POA: Diagnosis not present

## 2023-10-23 DIAGNOSIS — R2689 Other abnormalities of gait and mobility: Secondary | ICD-10-CM | POA: Diagnosis not present

## 2023-10-23 DIAGNOSIS — M6281 Muscle weakness (generalized): Secondary | ICD-10-CM | POA: Diagnosis not present

## 2023-10-28 DIAGNOSIS — R2689 Other abnormalities of gait and mobility: Secondary | ICD-10-CM | POA: Diagnosis not present

## 2023-10-28 DIAGNOSIS — M6281 Muscle weakness (generalized): Secondary | ICD-10-CM | POA: Diagnosis not present

## 2023-10-30 DIAGNOSIS — R2689 Other abnormalities of gait and mobility: Secondary | ICD-10-CM | POA: Diagnosis not present

## 2023-10-30 DIAGNOSIS — M6281 Muscle weakness (generalized): Secondary | ICD-10-CM | POA: Diagnosis not present

## 2023-11-05 DIAGNOSIS — M6281 Muscle weakness (generalized): Secondary | ICD-10-CM | POA: Diagnosis not present

## 2023-11-05 DIAGNOSIS — R2689 Other abnormalities of gait and mobility: Secondary | ICD-10-CM | POA: Diagnosis not present

## 2023-11-07 DIAGNOSIS — M6281 Muscle weakness (generalized): Secondary | ICD-10-CM | POA: Diagnosis not present

## 2023-11-07 DIAGNOSIS — R2689 Other abnormalities of gait and mobility: Secondary | ICD-10-CM | POA: Diagnosis not present

## 2023-11-13 DIAGNOSIS — R2689 Other abnormalities of gait and mobility: Secondary | ICD-10-CM | POA: Diagnosis not present

## 2023-11-13 DIAGNOSIS — M6281 Muscle weakness (generalized): Secondary | ICD-10-CM | POA: Diagnosis not present

## 2023-11-14 DIAGNOSIS — M6281 Muscle weakness (generalized): Secondary | ICD-10-CM | POA: Diagnosis not present

## 2023-11-14 DIAGNOSIS — R2689 Other abnormalities of gait and mobility: Secondary | ICD-10-CM | POA: Diagnosis not present

## 2023-11-15 DIAGNOSIS — E7800 Pure hypercholesterolemia, unspecified: Secondary | ICD-10-CM | POA: Diagnosis not present

## 2023-11-15 DIAGNOSIS — I5022 Chronic systolic (congestive) heart failure: Secondary | ICD-10-CM | POA: Diagnosis not present

## 2023-11-15 DIAGNOSIS — Z125 Encounter for screening for malignant neoplasm of prostate: Secondary | ICD-10-CM | POA: Diagnosis not present

## 2023-11-15 DIAGNOSIS — Z952 Presence of prosthetic heart valve: Secondary | ICD-10-CM | POA: Diagnosis not present

## 2023-11-15 DIAGNOSIS — E113291 Type 2 diabetes mellitus with mild nonproliferative diabetic retinopathy without macular edema, right eye: Secondary | ICD-10-CM | POA: Diagnosis not present

## 2023-11-15 DIAGNOSIS — E039 Hypothyroidism, unspecified: Secondary | ICD-10-CM | POA: Diagnosis not present

## 2023-11-15 DIAGNOSIS — Z0000 Encounter for general adult medical examination without abnormal findings: Secondary | ICD-10-CM | POA: Diagnosis not present

## 2023-11-15 DIAGNOSIS — I442 Atrioventricular block, complete: Secondary | ICD-10-CM | POA: Diagnosis not present

## 2023-11-15 DIAGNOSIS — F0150 Vascular dementia without behavioral disturbance: Secondary | ICD-10-CM | POA: Diagnosis not present

## 2023-11-15 DIAGNOSIS — N1831 Chronic kidney disease, stage 3a: Secondary | ICD-10-CM | POA: Diagnosis not present

## 2023-11-15 DIAGNOSIS — E538 Deficiency of other specified B group vitamins: Secondary | ICD-10-CM | POA: Diagnosis not present

## 2023-11-15 DIAGNOSIS — I10 Essential (primary) hypertension: Secondary | ICD-10-CM | POA: Diagnosis not present

## 2023-11-15 DIAGNOSIS — E1121 Type 2 diabetes mellitus with diabetic nephropathy: Secondary | ICD-10-CM | POA: Diagnosis not present

## 2023-11-20 DIAGNOSIS — R2689 Other abnormalities of gait and mobility: Secondary | ICD-10-CM | POA: Diagnosis not present

## 2023-11-20 DIAGNOSIS — M6281 Muscle weakness (generalized): Secondary | ICD-10-CM | POA: Diagnosis not present

## 2023-11-21 DIAGNOSIS — R2689 Other abnormalities of gait and mobility: Secondary | ICD-10-CM | POA: Diagnosis not present

## 2023-11-21 DIAGNOSIS — M6281 Muscle weakness (generalized): Secondary | ICD-10-CM | POA: Diagnosis not present

## 2023-11-26 DIAGNOSIS — M6281 Muscle weakness (generalized): Secondary | ICD-10-CM | POA: Diagnosis not present

## 2023-11-26 DIAGNOSIS — R2689 Other abnormalities of gait and mobility: Secondary | ICD-10-CM | POA: Diagnosis not present

## 2023-11-27 DIAGNOSIS — M6281 Muscle weakness (generalized): Secondary | ICD-10-CM | POA: Diagnosis not present

## 2023-11-27 DIAGNOSIS — R2689 Other abnormalities of gait and mobility: Secondary | ICD-10-CM | POA: Diagnosis not present

## 2023-11-28 DIAGNOSIS — I639 Cerebral infarction, unspecified: Secondary | ICD-10-CM | POA: Diagnosis not present

## 2023-11-28 DIAGNOSIS — F0150 Vascular dementia without behavioral disturbance: Secondary | ICD-10-CM | POA: Diagnosis not present

## 2023-11-28 DIAGNOSIS — E119 Type 2 diabetes mellitus without complications: Secondary | ICD-10-CM | POA: Diagnosis not present

## 2023-11-28 DIAGNOSIS — E039 Hypothyroidism, unspecified: Secondary | ICD-10-CM | POA: Diagnosis not present

## 2023-12-04 DIAGNOSIS — M6281 Muscle weakness (generalized): Secondary | ICD-10-CM | POA: Diagnosis not present

## 2023-12-04 DIAGNOSIS — R2689 Other abnormalities of gait and mobility: Secondary | ICD-10-CM | POA: Diagnosis not present

## 2023-12-05 DIAGNOSIS — R2689 Other abnormalities of gait and mobility: Secondary | ICD-10-CM | POA: Diagnosis not present

## 2023-12-05 DIAGNOSIS — M6281 Muscle weakness (generalized): Secondary | ICD-10-CM | POA: Diagnosis not present

## 2023-12-11 DIAGNOSIS — R2689 Other abnormalities of gait and mobility: Secondary | ICD-10-CM | POA: Diagnosis not present

## 2023-12-11 DIAGNOSIS — M6281 Muscle weakness (generalized): Secondary | ICD-10-CM | POA: Diagnosis not present

## 2023-12-12 DIAGNOSIS — M6281 Muscle weakness (generalized): Secondary | ICD-10-CM | POA: Diagnosis not present

## 2023-12-12 DIAGNOSIS — R2689 Other abnormalities of gait and mobility: Secondary | ICD-10-CM | POA: Diagnosis not present

## 2023-12-17 DIAGNOSIS — R2689 Other abnormalities of gait and mobility: Secondary | ICD-10-CM | POA: Diagnosis not present

## 2023-12-17 DIAGNOSIS — M6281 Muscle weakness (generalized): Secondary | ICD-10-CM | POA: Diagnosis not present

## 2023-12-18 DIAGNOSIS — M6281 Muscle weakness (generalized): Secondary | ICD-10-CM | POA: Diagnosis not present

## 2023-12-18 DIAGNOSIS — R2689 Other abnormalities of gait and mobility: Secondary | ICD-10-CM | POA: Diagnosis not present

## 2023-12-25 DIAGNOSIS — M6281 Muscle weakness (generalized): Secondary | ICD-10-CM | POA: Diagnosis not present

## 2023-12-25 DIAGNOSIS — R2689 Other abnormalities of gait and mobility: Secondary | ICD-10-CM | POA: Diagnosis not present

## 2023-12-29 DIAGNOSIS — E119 Type 2 diabetes mellitus without complications: Secondary | ICD-10-CM | POA: Diagnosis not present

## 2023-12-29 DIAGNOSIS — I639 Cerebral infarction, unspecified: Secondary | ICD-10-CM | POA: Diagnosis not present

## 2023-12-29 DIAGNOSIS — E039 Hypothyroidism, unspecified: Secondary | ICD-10-CM | POA: Diagnosis not present

## 2023-12-29 DIAGNOSIS — F0150 Vascular dementia without behavioral disturbance: Secondary | ICD-10-CM | POA: Diagnosis not present

## 2024-01-01 DIAGNOSIS — Z952 Presence of prosthetic heart valve: Secondary | ICD-10-CM | POA: Diagnosis not present

## 2024-01-01 DIAGNOSIS — F0150 Vascular dementia without behavioral disturbance: Secondary | ICD-10-CM | POA: Diagnosis not present

## 2024-01-01 DIAGNOSIS — E113291 Type 2 diabetes mellitus with mild nonproliferative diabetic retinopathy without macular edema, right eye: Secondary | ICD-10-CM | POA: Diagnosis not present

## 2024-01-01 DIAGNOSIS — R269 Unspecified abnormalities of gait and mobility: Secondary | ICD-10-CM | POA: Diagnosis not present

## 2024-01-01 DIAGNOSIS — M6281 Muscle weakness (generalized): Secondary | ICD-10-CM | POA: Diagnosis not present

## 2024-01-01 DIAGNOSIS — I10 Essential (primary) hypertension: Secondary | ICD-10-CM | POA: Diagnosis not present

## 2024-01-01 DIAGNOSIS — Z85850 Personal history of malignant neoplasm of thyroid: Secondary | ICD-10-CM | POA: Diagnosis not present

## 2024-01-01 DIAGNOSIS — E039 Hypothyroidism, unspecified: Secondary | ICD-10-CM | POA: Diagnosis not present

## 2024-01-01 DIAGNOSIS — I5022 Chronic systolic (congestive) heart failure: Secondary | ICD-10-CM | POA: Diagnosis not present

## 2024-01-01 DIAGNOSIS — N1831 Chronic kidney disease, stage 3a: Secondary | ICD-10-CM | POA: Diagnosis not present

## 2024-01-01 DIAGNOSIS — R2689 Other abnormalities of gait and mobility: Secondary | ICD-10-CM | POA: Diagnosis not present

## 2024-01-07 DIAGNOSIS — R2689 Other abnormalities of gait and mobility: Secondary | ICD-10-CM | POA: Diagnosis not present

## 2024-01-07 DIAGNOSIS — M6281 Muscle weakness (generalized): Secondary | ICD-10-CM | POA: Diagnosis not present

## 2024-01-09 ENCOUNTER — Ambulatory Visit: Attending: Cardiology | Admitting: Cardiology

## 2024-01-10 ENCOUNTER — Encounter: Payer: Self-pay | Admitting: Cardiology

## 2024-01-15 DIAGNOSIS — R2689 Other abnormalities of gait and mobility: Secondary | ICD-10-CM | POA: Diagnosis not present

## 2024-01-15 DIAGNOSIS — M6281 Muscle weakness (generalized): Secondary | ICD-10-CM | POA: Diagnosis not present

## 2024-01-21 DIAGNOSIS — M6281 Muscle weakness (generalized): Secondary | ICD-10-CM | POA: Diagnosis not present

## 2024-01-21 DIAGNOSIS — R2689 Other abnormalities of gait and mobility: Secondary | ICD-10-CM | POA: Diagnosis not present

## 2024-01-21 NOTE — Progress Notes (Incomplete)
 Cardiology Office Note:   Date:  01/21/2024  ID:  Tony [name], DOB December 29, 1950, MRN [id number] PCP: Ransom Other, MD  Tony [name] Providers Cardiologist:  Tony Sor, MD (Inactive) Electrophysiologist:  Tony Rakers, MD (Inactive) { Chief Complaint: No chief complaint on file.     History of Present Illness:   Tony [name] is a 73 y.o. male with a PMH of severe AS s/p 29 mm Edwards Sapien TAVR (08/29/21), HFrEF (EF = 30-35%), CHB s/p MDT DC PPM (2016), CVA, CKD, HTN, DM2, and hypothyroidism who presents for follow up ***.  Last seen in clinic on 09/07/2022 in valve clinic and notably had worsening EF.  Last PPM device interrogation performed on 09/16/2022 showed a pacing burden of 90%.   Past Medical History:  Diagnosis Date   Cancer (HCC)    thyroid    CHF (congestive heart failure) (HCC)    Complete heart block (HCC)    a. s/p MDT dual chamber PPM followed by Dr [name]    Complication of anesthesia    1985 after Thyriodectomy hard time waking up   Diabetes mellitus    GERD (gastroesophageal reflux disease)    Hypertension    Hypothyroidism    Had two surgeries for Cancer   Presence of permanent cardiac pacemaker    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   s/p TAVR with a 29 mm Edwards S3UR via the TF approach by Dr. Wonda and Dr. Lucas   Severe aortic stenosis    Sleep apnea      Studies Reviewed:    EKG: ***       Cardiac Studies & Procedures   ______________________________________________________________________________________________ CARDIAC CATHETERIZATION  CARDIAC CATHETERIZATION 08/07/2021  Conclusion   There is severe aortic valve stenosis.  1.  Patent coronary arteries with mild diffuse nonobstructive plaquing noted 2.  Calcified, restricted aortic valve on plain fluoroscopy consistent with the patient's known diagnosis of severe aortic stenosis 3.  Essentially normal right heart pressures with preserved cardiac  output  Recommend: Continue TAVR evaluation  Findings Coronary Findings Diagnostic  Dominance: Right  Left Main Vessel was injected. Vessel is normal in caliber. Patent vessel, trifurcates into the LAD, ramus intermedius, and left circumflex.  Left Anterior Descending There is mild diffuse disease throughout the vessel. The LAD courses to the apex.  The LAD and first diagonal branches have diffuse plaquing with no significant obstructive disease.  Ramus Intermedius Vessel is large. There is mild diffuse disease throughout the vessel. Large-caliber vessel, supplies 3 branches to the lateral wall with no significant stenosis.  There is mild diffuse plaquing noted.  Left Circumflex There is mild diffuse disease throughout the vessel. The circumflex is patent and supplies a small OM as well as 2 posterolateral branches.  There is no significant stenosis present.  Right Coronary Artery There is mild diffuse disease throughout the vessel. The RCA is patent throughout with mild plaquing in the proximal vessel.  There is good contrast reflux and no pressure dampening present.  There is no significant stenosis throughout the RCA distribution.  Intervention  No interventions have been documented.   STRESS TESTS  MYOCARDIAL PERFUSION IMAGING 10/06/2019  Interpretation Summary  Nuclear stress EF: 28%.  This is an intermediate risk study.  The left ventricular ejection fraction is severely decreased (<30%).  These findings are consistent with dilated non-ischemic cardiomyopathy. There is no ischemia. LVEF is severely decreased at 28% with global hypokinesis.   ECHOCARDIOGRAM  ECHOCARDIOGRAM COMPLETE 08/22/2022  Narrative ECHOCARDIOGRAM  REPORT    Patient Name:   Tony [name] Date of Exam: 08/22/2022 Medical Rec #:  [id number]       Height:       72.0 in Accession #:    [id number]      Weight:       239.0 lb Date of Birth:  Feb 21, 1951       BSA:          2.298 m Patient Age:     72 years        BP:           139/77 mmHg Patient Gender: M               HR:           75 bpm. Exam Location:  [address]  Procedure: 2D Echo, Cardiac Doppler and Color Doppler  Indications:    Z95.2 s/p TAVR  History:        Patient has prior history of Echocardiogram examinations, most recent 03/23/2022. CHF, Pacemaker, Arrythmias:CHB; Risk Factors:Diabetes, Sleep Apnea and Hypertension. Aortic Valve: 29 mm Sapien prosthetic, stented (TAVR) valve is present in the aortic position. Procedure Date: 08/29/2021.  Sonographer:    [name] RCS Referring Phys: [id number] KATHRYN R THOMPSON  IMPRESSIONS   1. Akinesis of the septum, distal inferior wall and apex; overall moderate to severe LV dysfunction; s/p TAVR with mean gradient 4 mmHg, DI 0.53 and no AI; compared to 03/23/22 LV function is worse and pericardial effusion slightly larger. 2. Left ventricular ejection fraction, by estimation, is 30 to 35%. The left ventricle has moderate to severely decreased function. The left ventricle demonstrates regional wall motion abnormalities (see scoring diagram/findings for description). There is mild concentric left ventricular hypertrophy. Left ventricular diastolic parameters are consistent with Grade I diastolic dysfunction (impaired relaxation). Elevated left atrial pressure. 3. Right ventricular systolic function is normal. The right ventricular size is normal. 4. Left atrial size was mildly dilated. 5. A small pericardial effusion is present. 6. The mitral valve is normal in structure. Mild mitral valve regurgitation. No evidence of mitral stenosis. 7. The aortic valve has been repaired/replaced. Aortic valve regurgitation is not visualized. No aortic stenosis is present. There is a 29 mm Sapien prosthetic (TAVR) valve present in the aortic position. Procedure Date: 08/29/2021. Echo findings are consistent with normal structure and function of the aortic valve prosthesis. 8. Aortic  dilatation noted. There is mild dilatation of the ascending aorta, measuring 40 mm.  FINDINGS Left Ventricle: Left ventricular ejection fraction, by estimation, is 30 to 35%. The left ventricle has moderate to severely decreased function. The left ventricle demonstrates regional wall motion abnormalities. The left ventricular internal cavity size was normal in size. There is mild concentric left ventricular hypertrophy. Left ventricular diastolic parameters are consistent with Grade I diastolic dysfunction (impaired relaxation). Elevated left atrial pressure.  Right Ventricle: The right ventricular size is normal. Right ventricular systolic function is normal.  Left Atrium: Left atrial size was mildly dilated.  Right Atrium: Right atrial size was normal in size.  Pericardium: A small pericardial effusion is present.  Mitral Valve: The mitral valve is normal in structure. Mild mitral valve regurgitation. No evidence of mitral valve stenosis.  Tricuspid Valve: The tricuspid valve is normal in structure. Tricuspid valve regurgitation is trivial. No evidence of tricuspid stenosis.  Aortic Valve: The aortic valve has been repaired/replaced. Aortic valve regurgitation is not visualized. No aortic stenosis is present. Aortic valve mean gradient measures  4.0 mmHg. Aortic valve peak gradient measures 8.5 mmHg. Aortic valve area, by VTI measures 1.67 cm. There is a 29 mm Sapien prosthetic, stented (TAVR) valve present in the aortic position. Procedure Date: 08/29/2021. Echo findings are consistent with normal structure and function of the aortic valve prosthesis.  Pulmonic Valve: The pulmonic valve was normal in structure. Pulmonic valve regurgitation is trivial. No evidence of pulmonic stenosis.  Aorta: Aortic dilatation noted. There is mild dilatation of the ascending aorta, measuring 40 mm.  Venous: The inferior vena cava was not well visualized.  IAS/Shunts: No atrial level shunt detected by  color flow Doppler.  Additional Comments: Akinesis of the septum, distal inferior wall and apex; overall moderate to severe LV dysfunction; s/p TAVR with mean gradient 4 mmHg, DI 0.53 and no AI; compared to 03/23/22 LV function is worse and pericardial effusion slightly larger. A device lead is visualized.   LEFT VENTRICLE PLAX 2D LVIDd:         4.70 cm   Diastology LVIDs:         3.00 cm   LV e' medial:    4.68 cm/s LV PW:         1.30 cm   LV E/e' medial:  19.4 LV IVS:        1.30 cm   LV e' lateral:   9.36 cm/s LVOT diam:     2.00 cm   LV E/e' lateral: 9.7 LV SV:         36 LV SV Index:   16 LVOT Area:     3.14 cm   RIGHT VENTRICLE RV Basal diam:  3.50 cm RV S prime:     12.70 cm/s TAPSE (M-mode): 2.0 cm RVSP:           22.4 mmHg  LEFT ATRIUM             Index        RIGHT ATRIUM           Index LA diam:        4.90 cm 2.13 cm/m   RA Pressure: 3.00 mmHg LA Vol (A2C):   91.6 ml 39.86 ml/m  RA Area:     10.20 cm LA Vol (A4C):   63.4 ml 27.59 ml/m  RA Volume:   24.80 ml  10.79 ml/m LA Biplane Vol: 78.6 ml 34.20 ml/m AORTIC VALVE AV Area (Vmax):    1.43 cm AV Area (Vmean):   1.65 cm AV Area (VTI):     1.67 cm AV Vmax:           146.00 cm/s AV Vmean:          89.700 cm/s AV VTI:            0.216 m AV Peak Grad:      8.5 mmHg AV Mean Grad:      4.0 mmHg LVOT Vmax:         66.30 cm/s LVOT Vmean:        47.100 cm/s LVOT VTI:          0.115 m LVOT/AV VTI ratio: 0.53  AORTA Ao Root diam: 3.30 cm Ao Asc diam:  4.00 cm  MITRAL VALVE                TRICUSPID VALVE MV Area (PHT):              TR Peak grad:   19.4 mmHg MV Decel Time:  TR Vmax:        220.00 cm/s MR Peak grad: 83.2 mmHg     Estimated RAP:  3.00 mmHg MR Mean grad: 40.0 mmHg     RVSP:           22.4 mmHg MR Vmax:      456.00 cm/s MR Vmean:     285.0 cm/s    SHUNTS MV E velocity: 90.70 cm/s   Systemic VTI:  0.12 m MV A velocity: 116.00 cm/s  Systemic Diam: 2.00 cm MV E/A ratio:   0.78  Redell Shallow MD Electronically signed by Redell Shallow MD Signature Date/Time: 08/22/2022/3:13:33 PM    Final      CT SCANS  CT CORONARY MORPH W/CTA COR W/SCORE 08/14/2021  Addendum 08/16/2021 12:08 AM ADDENDUM REPORT: 08/16/2021 00:06  CLINICAL DATA:  53M with severe aortic stenosis being evaluated for a TAVR procedure.  EXAM: Cardiac TAVR CT  TECHNIQUE: The patient was scanned on a [employer]. A 120 kV retrospective scan was triggered in the descending thoracic aorta at 111 HU's. Gantry rotation speed was 250 msecs and collimation was .6 mm. No beta blockade or nitro were given. The 3D data set was reconstructed in 5% intervals of the R-R cycle. Systolic and diastolic phases were analyzed on a dedicated work station using MPR, MIP and VRT modes. The patient received 80 cc of contrast.  FINDINGS: Aortic Root:  Aortic valve: Trileaflet  Aortic valve calcium  score: 4649  Aortic annulus:  Diameter: 30mm x 24mm  Perimeter: 86mm  Area: 563 mm^2  Calcifications: Mild calcification adjacent to noncoronary cusp  Coronary height: Min Left - 11mm, Max Left - 16mm; Min Right - 16mm  Sinotubular height: Left cusp - 18mm; Right cusp - 22mm; Noncoronary cusp - 20mm  LVOT (as measured 3 mm below the annulus):  Diameter: 33mm x 23mm  Area: 560 mm^2  Calcifications: No calcifications  Aortic sinus width: Left cusp - 37mm; Right cusp - 34mm; Noncoronary cusp - 38mm  Sinotubular junction width: 31mm x 29mm  Optimum Fluoroscopic Angle for Delivery: LAO 14 CRA 6  Cardiac:  Right atrium: Normal size.  Pacemaker lead  Right ventricle: Normal size.  Pacemaker lead  Pulmonary arteries: Normal size  Pulmonary veins: Normal configuration  Left atrium: Mild enlargement  Left ventricle: Normal size  Pericardium: Normal thickness  Coronary arteries: Calcium  score 503 (70th percentile)  IMPRESSION: 1. Trileaflet aortic valve with severe  calcifications (AV calcium  score 4649)  2. Aortic annulus measures 30mm x 24mm in diameter with perimeter 86mm and area 563 mm^2. Mild annular calcifications adjacent to noncoronary cusp. No LVOT calcifications. Annular measurements suitable for delivery of 29mm Edwards Sapien 3 valve  3. Low coronary height to left main (11mm). Sufficient coronary height for RCA (16mm)  4. Optimum Fluoroscopic Angle for Delivery:  LAO 14 CRA 6  5. Coronary calcium  score 503 (70th percentile)   Electronically Signed By: Lonni Nanas M.D. On: 08/16/2021 00:06  Narrative EXAM: OVER-READ INTERPRETATION  CT CHEST  The following report is an over-read performed by radiologist Dr. Rea Marc of [facility] on 08/14/2021. This over-read does not include interpretation of cardiac or coronary anatomy or pathology. The coronary calcium  score/coronary CTA interpretation by the cardiologist is attached.  COMPARISON:  None.  FINDINGS: Extracardiac findings will be described separately under dictation for contemporaneously obtained CTA chest, abdomen and pelvis.  IMPRESSION: Please see separate dictation for contemporaneously obtained CTA chest, abdomen and pelvis dated 08/14/2021 for  full description of relevant extracardiac findings.  Electronically Signed: By: Rea Marc M.D. On: 08/14/2021 11:18     ______________________________________________________________________________________________      Risk Assessment/Calculations:   {Does this patient have ATRIAL FIBRILLATION?[phone]} No BP recorded.  {Refresh Note OR Click here to enter BP  :1}***        Physical Exam:     VS:  There were no vitals taken for this visit. ***    Wt Readings from Last 3 Encounters:  04/24/23 235 lb 3.7 oz (106.7 kg)  01/24/23 244 lb 14.9 oz (111.1 kg)  01/19/23 240 lb (108.9 kg)     GEN: Well nourished, well developed, in no acute distress NECK: No JVD; No carotid  bruits CARDIAC: ***RRR, no murmurs, rubs, gallops RESPIRATORY:  Clear to auscultation without rales, wheezing or rhonchi  ABDOMEN: Soft, non-tender, non-distended, normal bowel sounds EXTREMITIES:  Warm and well perfused, no edema; No deformity, 2+ radial pulses PSYCH: Normal mood and affect   Assessment & Plan   - EP referral for upgrade to CRT    {Are you ordering a CV Procedure (e.g. stress test, cath, DCCV, TEE, etc)?   Press F2        :[id number]}   This note was written with the assistance of a dictation microphone or AI dictation software. Please excuse any typos or grammatical errors.   Signed, Georganna Archer, MD 01/21/2024 12:52 PM    Ribera [name]

## 2024-01-22 ENCOUNTER — Ambulatory Visit
Attending: Student in an Organized Health Care Education/Training Program | Admitting: Student in an Organized Health Care Education/Training Program

## 2024-01-23 ENCOUNTER — Ambulatory Visit
Attending: Student in an Organized Health Care Education/Training Program | Admitting: Student in an Organized Health Care Education/Training Program

## 2024-01-23 ENCOUNTER — Encounter: Payer: Self-pay | Admitting: Student in an Organized Health Care Education/Training Program

## 2024-01-23 ENCOUNTER — Other Ambulatory Visit (HOSPITAL_COMMUNITY): Payer: Self-pay

## 2024-01-23 VITALS — BP 116/78 | HR 64 | Ht 72.0 in | Wt 237.6 lb

## 2024-01-23 DIAGNOSIS — I350 Nonrheumatic aortic (valve) stenosis: Secondary | ICD-10-CM | POA: Diagnosis not present

## 2024-01-23 DIAGNOSIS — I5042 Chronic combined systolic (congestive) and diastolic (congestive) heart failure: Secondary | ICD-10-CM | POA: Diagnosis not present

## 2024-01-23 DIAGNOSIS — Z952 Presence of prosthetic heart valve: Secondary | ICD-10-CM

## 2024-01-23 DIAGNOSIS — E1159 Type 2 diabetes mellitus with other circulatory complications: Secondary | ICD-10-CM

## 2024-01-23 DIAGNOSIS — E782 Mixed hyperlipidemia: Secondary | ICD-10-CM

## 2024-01-23 DIAGNOSIS — I152 Hypertension secondary to endocrine disorders: Secondary | ICD-10-CM

## 2024-01-23 DIAGNOSIS — I442 Atrioventricular block, complete: Secondary | ICD-10-CM

## 2024-01-23 MED ORDER — EMPAGLIFLOZIN 10 MG PO TABS
10.0000 mg | ORAL_TABLET | Freq: Every day | ORAL | 3 refills | Status: AC
  Filled 2024-01-23: qty 90, 90d supply, fill #0

## 2024-01-23 NOTE — Assessment & Plan Note (Signed)
BP is at goal.  No changes.

## 2024-01-23 NOTE — Assessment & Plan Note (Signed)
 Patient presenting for follow-up and is thankfully doing very well symptom wise.  However, I am concerned about his drop in EF and new WMA on his most recent echo 1 year ago.  I think that the patient likely has a reduction in his EF from his frequent RV pacing perhaps causing a pacemaker mediated cardiomyopathy.  He is NYHA class I currently, but I am concerned that his EF may continue to decline with pure RV pacing.  For this reason, I will refer the patient to EP for consideration of placing a CS lead.  I will hold off on referring to catheterization to rule out CAD as a potential for his drop in EF given his absence of symptoms related to CAD, is unremarkable cath in 2023, and his underlying CKD.  Lastly, he is not currently taking canagliflozin  per his report for unclear reasons.  I will prescribe empagliflozin  for management of his HFrEF. -Refer to EP for consideration of CS lead. -Start empagliflozin  10 mg daily -Remove canagliflozin  from medication list -Continue Entresto  low-dose -Continue Coreg  6.25 mg twice daily -Continue finerenone  10 mg daily -Complete echo as below - BMP

## 2024-01-23 NOTE — Patient Instructions (Signed)
 Medication Instructions:  START Jardiance  10 mg daily  *If you need a refill on your cardiac medications before your next appointment, please call your pharmacy*  Lab Work: BMP    LIPID PANEL  If you have labs (blood work) drawn today and your tests are completely normal, you will receive your results only by: MyChart Message (if you have MyChart) OR A paper copy in the mail If you have any lab test that is abnormal or we need to change your treatment, we will call you to review the results.  Testing/Procedures: ECHOCARDIOGRAM  Your physician has requested that you have an echocardiogram. Echocardiography is a painless test that uses sound waves to create images of your heart. It provides your doctor with information about the size and shape of your heart and how well your heart's chambers and valves are working. This procedure takes approximately one hour. There are no restrictions for this procedure. Please do NOT wear cologne, perfume, aftershave, or lotions (deodorant is allowed). Please arrive 15 minutes prior to your appointment time.  Please note: We ask at that you not bring children with you during ultrasound (echo/ vascular) testing. Due to room size and safety concerns, children are not allowed in the ultrasound rooms during exams. Our front office staff cannot provide observation of children in our lobby area while testing is being conducted. An adult accompanying a patient to their appointment will only be allowed in the ultrasound room at the discretion of the ultrasound technician under special circumstances. We apologize for any inconvenience.   Follow-Up: At [facility], you and your health needs are our priority.  As part of our continuing mission to provide you with exceptional heart care, our providers are all part of one team.  This team includes your primary Cardiologist (physician) and Advanced Practice Providers or APPs (Physician Assistants and Nurse  Practitioners) who all work together to provide you with the care you need, when you need it.  Your next appointment:   12 month(s)  Provider:   Georganna Archer, MD

## 2024-01-23 NOTE — Progress Notes (Signed)
 Cardiology Office Note:   Date:  01/23/2024  ID:  Tony [name], DOB 02-23-1951, MRN [id number] PCP: Tony Other, MD  [facility] Providers Cardiologist:  Tony Archer, MD Electrophysiologist:  Tony Rakers, MD (Inactive) { Chief Complaint:  Chief Complaint  Patient presents with   Follow-up      History of Present Illness:   Tony [name] is a 73 y.o. male with a PMH of severe AS s/p 29 mm Edwards Sapien TAVR (08/29/21), HFrEF (EF = 30-35%), CHB s/p MDT DC PPM (2016), CVA, CKD, HTN, DM2, and hypothyroidism who presents for follow up.  Patient presents today for follow-up.  He has no complaints.  He had issues with suffering mechanical falls and just finished PT.  He ambulates with a walker and says that it helps tremendously.  He denies chest pain, SOB, PND, orthopnea, swelling, syncope, presyncope.  He is taking all of his medications as prescribed without side effect.  He says that he stopped taking canagliflozin  a while ago but does not know why it was stopped.  He was last seen in clinic on 09/07/2022 in valve clinic and notably had worsening EF on his echocardiogram.  Notably he had a WMA involving the anteroseptum and apex.  Last PPM device interrogation performed on 09/16/2022 showed a pacing burden of 90%.  No interval ED visits or hospitalizations.   Past Medical History:  Diagnosis Date   Cancer (HCC)    thyroid    CHF (congestive heart failure) (HCC)    Complete heart block (HCC)    a. s/p MDT dual chamber PPM followed by Dr [name]    Complication of anesthesia    1985 after Thyriodectomy hard time waking up   Diabetes mellitus    GERD (gastroesophageal reflux disease)    Hypertension    Hypothyroidism    Had two surgeries for Cancer   Presence of permanent cardiac pacemaker    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   s/p TAVR with a 29 mm Edwards S3UR via the TF approach by Dr. Wonda and Dr. Lucas   Severe aortic stenosis     Sleep apnea      Studies Reviewed:    EKG:  EKG Interpretation Date/Time:  Thursday January 23 2024 13:35:35 EDT Ventricular Rate:  64 PR Interval:  194 QRS Duration:  198 QT Interval:  492 QTC Calculation: 507 R Axis:   -80  Text Interpretation: Atrial-sensed ventricular-paced rhythm When compared with ECG of 23-Apr-2023 11:28, PREVIOUS ECG IS PRESENT Confirmed by [name] Tony [phone]) on 01/23/2024 1:40:04 PM     Cardiac Studies & Procedures   ______________________________________________________________________________________________ CARDIAC CATHETERIZATION  CARDIAC CATHETERIZATION 08/07/2021  Conclusion   There is severe aortic valve stenosis.  1.  Patent coronary arteries with mild diffuse nonobstructive plaquing noted 2.  Calcified, restricted aortic valve on plain fluoroscopy consistent with the patient's known diagnosis of severe aortic stenosis 3.  Essentially normal right heart pressures with preserved cardiac output  Recommend: Continue TAVR evaluation  Findings Coronary Findings Diagnostic  Dominance: Right  Left Main Vessel was injected. Vessel is normal in caliber. Patent vessel, trifurcates into the LAD, ramus intermedius, and left circumflex.  Left Anterior Descending There is mild diffuse disease throughout the vessel. The LAD courses to the apex.  The LAD and first diagonal branches have diffuse plaquing with no significant obstructive disease.  Ramus Intermedius Vessel is large. There is mild diffuse disease throughout the vessel. Large-caliber vessel, supplies 3 branches to the lateral wall  with no significant stenosis.  There is mild diffuse plaquing noted.  Left Circumflex There is mild diffuse disease throughout the vessel. The circumflex is patent and supplies a small OM as well as 2 posterolateral branches.  There is no significant stenosis present.  Right Coronary Artery There is mild diffuse disease throughout the vessel. The RCA is  patent throughout with mild plaquing in the proximal vessel.  There is good contrast reflux and no pressure dampening present.  There is no significant stenosis throughout the RCA distribution.  Intervention  No interventions have been documented.   STRESS TESTS  MYOCARDIAL PERFUSION IMAGING 10/06/2019  Interpretation Summary  Nuclear stress EF: 28%.  This is an intermediate risk study.  The left ventricular ejection fraction is severely decreased (<30%).  These findings are consistent with dilated non-ischemic cardiomyopathy. There is no ischemia. LVEF is severely decreased at 28% with global hypokinesis.   ECHOCARDIOGRAM  ECHOCARDIOGRAM COMPLETE 08/22/2022  Narrative ECHOCARDIOGRAM REPORT    Patient Name:   Tony [name] Date of Exam: 08/22/2022 Medical Rec #:  [id number]       Height:       72.0 in Accession #:    [id number]      Weight:       239.0 lb Date of Birth:  July 31, 1950       BSA:          2.298 m Patient Age:    72 years        BP:           139/77 mmHg Patient Gender: M               HR:           75 bpm. Exam Location:  [address]  Procedure: 2D Echo, Cardiac Doppler and Color Doppler  Indications:    Z95.2 s/p TAVR  History:        Patient has prior history of Echocardiogram examinations, most recent 03/23/2022. CHF, Pacemaker, Arrythmias:CHB; Risk Factors:Diabetes, Sleep Apnea and Hypertension. Aortic Valve: 29 mm Sapien prosthetic, stented (TAVR) valve is present in the aortic position. Procedure Date: 08/29/2021.  Sonographer:    Tony [name] RCS Referring Phys: [id number] Tony [name]  IMPRESSIONS   1. Akinesis of the septum, distal inferior wall and apex; overall moderate to severe LV dysfunction; s/p TAVR with mean gradient 4 mmHg, DI 0.53 and no AI; compared to 03/23/22 LV function is worse and pericardial effusion slightly larger. 2. Left ventricular ejection fraction, by estimation, is 30 to 35%. The left ventricle has moderate to  severely decreased function. The left ventricle demonstrates regional wall motion abnormalities (see scoring diagram/findings for description). There is mild concentric left ventricular hypertrophy. Left ventricular diastolic parameters are consistent with Grade I diastolic dysfunction (impaired relaxation). Elevated left atrial pressure. 3. Right ventricular systolic function is normal. The right ventricular size is normal. 4. Left atrial size was mildly dilated. 5. A small pericardial effusion is present. 6. The mitral valve is normal in structure. Mild mitral valve regurgitation. No evidence of mitral stenosis. 7. The aortic valve has been repaired/replaced. Aortic valve regurgitation is not visualized. No aortic stenosis is present. There is a 29 mm Sapien prosthetic (TAVR) valve present in the aortic position. Procedure Date: 08/29/2021. Echo findings are consistent with normal structure and function of the aortic valve prosthesis. 8. Aortic dilatation noted. There is mild dilatation of the ascending aorta, measuring 40 mm.  FINDINGS Left Ventricle: Left ventricular ejection fraction, by  estimation, is 30 to 35%. The left ventricle has moderate to severely decreased function. The left ventricle demonstrates regional wall motion abnormalities. The left ventricular internal cavity size was normal in size. There is mild concentric left ventricular hypertrophy. Left ventricular diastolic parameters are consistent with Grade I diastolic dysfunction (impaired relaxation). Elevated left atrial pressure.  Right Ventricle: The right ventricular size is normal. Right ventricular systolic function is normal.  Left Atrium: Left atrial size was mildly dilated.  Right Atrium: Right atrial size was normal in size.  Pericardium: A small pericardial effusion is present.  Mitral Valve: The mitral valve is normal in structure. Mild mitral valve regurgitation. No evidence of mitral valve  stenosis.  Tricuspid Valve: The tricuspid valve is normal in structure. Tricuspid valve regurgitation is trivial. No evidence of tricuspid stenosis.  Aortic Valve: The aortic valve has been repaired/replaced. Aortic valve regurgitation is not visualized. No aortic stenosis is present. Aortic valve mean gradient measures 4.0 mmHg. Aortic valve peak gradient measures 8.5 mmHg. Aortic valve area, by VTI measures 1.67 cm. There is a 29 mm Sapien prosthetic, stented (TAVR) valve present in the aortic position. Procedure Date: 08/29/2021. Echo findings are consistent with normal structure and function of the aortic valve prosthesis.  Pulmonic Valve: The pulmonic valve was normal in structure. Pulmonic valve regurgitation is trivial. No evidence of pulmonic stenosis.  Aorta: Aortic dilatation noted. There is mild dilatation of the ascending aorta, measuring 40 mm.  Venous: The inferior vena cava was not well visualized.  IAS/Shunts: No atrial level shunt detected by color flow Doppler.  Additional Comments: Akinesis of the septum, distal inferior wall and apex; overall moderate to severe LV dysfunction; s/p TAVR with mean gradient 4 mmHg, DI 0.53 and no AI; compared to 03/23/22 LV function is worse and pericardial effusion slightly larger. A device lead is visualized.   LEFT VENTRICLE PLAX 2D LVIDd:         4.70 cm   Diastology LVIDs:         3.00 cm   LV e' medial:    4.68 cm/s LV PW:         1.30 cm   LV E/e' medial:  19.4 LV IVS:        1.30 cm   LV e' lateral:   9.36 cm/s LVOT diam:     2.00 cm   LV E/e' lateral: 9.7 LV SV:         36 LV SV Index:   16 LVOT Area:     3.14 cm   RIGHT VENTRICLE RV Basal diam:  3.50 cm RV S prime:     12.70 cm/s TAPSE (M-mode): 2.0 cm RVSP:           22.4 mmHg  LEFT ATRIUM             Index        RIGHT ATRIUM           Index LA diam:        4.90 cm 2.13 cm/m   RA Pressure: 3.00 mmHg LA Vol (A2C):   91.6 ml 39.86 ml/m  RA Area:     10.20 cm LA  Vol (A4C):   63.4 ml 27.59 ml/m  RA Volume:   24.80 ml  10.79 ml/m LA Biplane Vol: 78.6 ml 34.20 ml/m AORTIC VALVE AV Area (Vmax):    1.43 cm AV Area (Vmean):   1.65 cm AV Area (VTI):     1.67 cm AV Vmax:  146.00 cm/s AV Vmean:          89.700 cm/s AV VTI:            0.216 m AV Peak Grad:      8.5 mmHg AV Mean Grad:      4.0 mmHg LVOT Vmax:         66.30 cm/s LVOT Vmean:        47.100 cm/s LVOT VTI:          0.115 m LVOT/AV VTI ratio: 0.53  AORTA Ao Root diam: 3.30 cm Ao Asc diam:  4.00 cm  MITRAL VALVE                TRICUSPID VALVE MV Area (PHT):              TR Peak grad:   19.4 mmHg MV Decel Time:              TR Vmax:        220.00 cm/s MR Peak grad: 83.2 mmHg     Estimated RAP:  3.00 mmHg MR Mean grad: 40.0 mmHg     RVSP:           22.4 mmHg MR Vmax:      456.00 cm/s MR Vmean:     285.0 cm/s    SHUNTS MV E velocity: 90.70 cm/s   Systemic VTI:  0.12 m MV A velocity: 116.00 cm/s  Systemic Diam: 2.00 cm MV E/A ratio:  0.78  Redell Shallow MD Electronically signed by Redell Shallow MD Signature Date/Time: 08/22/2022/3:13:33 PM    Final      CT SCANS  CT CORONARY MORPH W/CTA COR W/SCORE 08/14/2021  Addendum 08/16/2021 12:08 AM ADDENDUM REPORT: 08/16/2021 00:06  CLINICAL DATA:  49M with severe aortic stenosis being evaluated for a TAVR procedure.  EXAM: Cardiac TAVR CT  TECHNIQUE: The patient was scanned on a [employer]. A 120 kV retrospective scan was triggered in the descending thoracic aorta at 111 HU's. Gantry rotation speed was 250 msecs and collimation was .6 mm. No beta blockade or nitro were given. The 3D data set was reconstructed in 5% intervals of the R-R cycle. Systolic and diastolic phases were analyzed on a dedicated work station using MPR, MIP and VRT modes. The patient received 80 cc of contrast.  FINDINGS: Aortic Root:  Aortic valve: Trileaflet  Aortic valve calcium  score: 4649  Aortic annulus:  Diameter:  30mm x 24mm  Perimeter: 86mm  Area: 563 mm^2  Calcifications: Mild calcification adjacent to noncoronary cusp  Coronary height: Min Left - 11mm, Max Left - 16mm; Min Right - 16mm  Sinotubular height: Left cusp - 18mm; Right cusp - 22mm; Noncoronary cusp - 20mm  LVOT (as measured 3 mm below the annulus):  Diameter: 33mm x 23mm  Area: 560 mm^2  Calcifications: No calcifications  Aortic sinus width: Left cusp - 37mm; Right cusp - 34mm; Noncoronary cusp - 38mm  Sinotubular junction width: 31mm x 29mm  Optimum Fluoroscopic Angle for Delivery: LAO 14 CRA 6  Cardiac:  Right atrium: Normal size.  Pacemaker lead  Right ventricle: Normal size.  Pacemaker lead  Pulmonary arteries: Normal size  Pulmonary veins: Normal configuration  Left atrium: Mild enlargement  Left ventricle: Normal size  Pericardium: Normal thickness  Coronary arteries: Calcium  score 503 (70th percentile)  IMPRESSION: 1. Trileaflet aortic valve with severe calcifications (AV calcium  score 4649)  2. Aortic annulus measures 30mm x 24mm in diameter with perimeter 86mm and  area 563 mm^2. Mild annular calcifications adjacent to noncoronary cusp. No LVOT calcifications. Annular measurements suitable for delivery of 29mm Edwards Sapien 3 valve  3. Low coronary height to left main (11mm). Sufficient coronary height for RCA (16mm)  4. Optimum Fluoroscopic Angle for Delivery:  LAO 14 CRA 6  5. Coronary calcium  score 503 (70th percentile)   Electronically Signed By: Lonni Nanas M.D. On: 08/16/2021 00:06  Narrative EXAM: OVER-READ INTERPRETATION  CT CHEST  The following report is an over-read performed by radiologist Dr. Rea Marc of [facility] on 08/14/2021. This over-read does not include interpretation of cardiac or coronary anatomy or pathology. The coronary calcium  score/coronary CTA interpretation by the cardiologist is attached.  COMPARISON:   None.  FINDINGS: Extracardiac findings will be described separately under dictation for contemporaneously obtained CTA chest, abdomen and pelvis.  IMPRESSION: Please see separate dictation for contemporaneously obtained CTA chest, abdomen and pelvis dated 08/14/2021 for full description of relevant extracardiac findings.  Electronically Signed: By: Rea Marc M.D. On: 08/14/2021 11:18     ______________________________________________________________________________________________      Risk Assessment/Calculations:              Physical Exam:     VS:  BP 116/78   Pulse 64   Ht 6' (1.829 m)   Wt 237 lb 9.6 oz (107.8 kg)   SpO2 98%   BMI 32.22 kg/m      Wt Readings from Last 3 Encounters:  04/24/23 235 lb 3.7 oz (106.7 kg)  01/24/23 244 lb 14.9 oz (111.1 kg)  01/19/23 240 lb (108.9 kg)     GEN: Well nourished, well developed, in no acute distress NECK: No JVD; No carotid bruits CARDIAC: RRR, no murmurs, rubs, gallops RESPIRATORY:  Clear to auscultation without rales, wheezing or rhonchi  ABDOMEN: Soft, non-tender, non-distended, normal bowel sounds EXTREMITIES:  Warm and well perfused, no edema; No deformity, 2+ radial pulses PSYCH: Normal mood and affect   Assessment & Plan Chronic combined systolic and diastolic CHF (congestive heart failure) (HCC) Patient presenting for follow-up and is thankfully doing very well symptom wise.  However, I am concerned about his drop in EF and new WMA on his most recent echo 1 year ago.  I think that the patient likely has a reduction in his EF from his frequent RV pacing perhaps causing a pacemaker mediated cardiomyopathy.  He is NYHA class I currently, but I am concerned that his EF may continue to decline with pure RV pacing.  For this reason, I will refer the patient to EP for consideration of placing a CS lead.  I will hold off on referring to catheterization to rule out CAD as a potential for his drop in EF given his  absence of symptoms related to CAD, is unremarkable cath in 2023, and his underlying CKD.  Lastly, he is not currently taking canagliflozin  per his report for unclear reasons.  I will prescribe empagliflozin  for management of his HFrEF. -Refer to EP for consideration of CS lead. -Start empagliflozin  10 mg daily -Remove canagliflozin  from medication list -Continue Entresto  low-dose -Continue Coreg  6.25 mg twice daily -Continue finerenone  10 mg daily -Complete echo as below - BMP  Hypertension associated with diabetes (HCC) BP is at goal.  No changes. S/P TAVR (transcatheter aortic valve replacement) Underwent TAVR placement in 2023.  He needs an annual TTE per guidelines.  Will order today. -Complete echo  Mixed hyperlipidemia Tolerating statin well.  Will check lipids. -Lipid panel  This note was written with the assistance of a dictation microphone or AI dictation software. Please excuse any typos or grammatical errors.   Signed, Tony Archer, MD 01/23/2024 1:29 PM    [facility]

## 2024-01-23 NOTE — Assessment & Plan Note (Addendum)
 Underwent TAVR placement in 2023.  He needs an annual TTE per guidelines.  Will order today. -Complete echo

## 2024-01-23 NOTE — Assessment & Plan Note (Signed)
 Tolerating statin well.  Will check lipids. -Lipid panel

## 2024-01-24 ENCOUNTER — Ambulatory Visit: Payer: Self-pay | Admitting: Student in an Organized Health Care Education/Training Program

## 2024-01-24 LAB — BASIC METABOLIC PANEL WITH GFR
BUN/Creatinine Ratio: 13 (ref 10–24)
BUN: 19 mg/dL (ref 8–27)
CO2: 19 mmol/L — ABNORMAL LOW (ref 20–29)
Calcium: 9.5 mg/dL (ref 8.6–10.2)
Chloride: 102 mmol/L (ref 96–106)
Creatinine, Ser: 1.43 mg/dL — ABNORMAL HIGH (ref 0.76–1.27)
Glucose: 91 mg/dL (ref 70–99)
Potassium: 4.6 mmol/L (ref 3.5–5.2)
Sodium: 142 mmol/L (ref 134–144)
eGFR: 51 mL/min/1.73 — ABNORMAL LOW (ref >59)

## 2024-01-24 LAB — LIPID PANEL
Chol/HDL Ratio: 4.2 ratio (ref 0.0–5.0)
Cholesterol, Total: 144 mg/dL (ref 100–199)
HDL: 34 mg/dL — ABNORMAL LOW (ref >39)
LDL Chol Calc (NIH): 79 mg/dL (ref 0–99)
Triglycerides: 179 mg/dL — ABNORMAL HIGH (ref 0–149)
VLDL Cholesterol Cal: 31 mg/dL (ref 5–40)

## 2024-01-28 DIAGNOSIS — F0150 Vascular dementia without behavioral disturbance: Secondary | ICD-10-CM | POA: Diagnosis not present

## 2024-01-28 DIAGNOSIS — E039 Hypothyroidism, unspecified: Secondary | ICD-10-CM | POA: Diagnosis not present

## 2024-01-28 DIAGNOSIS — E119 Type 2 diabetes mellitus without complications: Secondary | ICD-10-CM | POA: Diagnosis not present

## 2024-01-28 DIAGNOSIS — I639 Cerebral infarction, unspecified: Secondary | ICD-10-CM | POA: Diagnosis not present

## 2024-01-30 MED ORDER — ATORVASTATIN CALCIUM 80 MG PO TABS: 80.0000 mg | ORAL_TABLET | Freq: Every evening | ORAL | 3 refills | Status: AC

## 2024-01-30 MED ORDER — ATORVASTATIN CALCIUM 80 MG PO TABS: 80.0000 mg | ORAL_TABLET | Freq: Every evening | ORAL | 3 refills | Status: DC

## 2024-02-04 ENCOUNTER — Other Ambulatory Visit (HOSPITAL_COMMUNITY): Payer: Self-pay

## 2024-02-28 DIAGNOSIS — F0150 Vascular dementia without behavioral disturbance: Secondary | ICD-10-CM | POA: Diagnosis not present

## 2024-02-28 DIAGNOSIS — E039 Hypothyroidism, unspecified: Secondary | ICD-10-CM | POA: Diagnosis not present

## 2024-02-28 DIAGNOSIS — I639 Cerebral infarction, unspecified: Secondary | ICD-10-CM | POA: Diagnosis not present

## 2024-02-28 DIAGNOSIS — E119 Type 2 diabetes mellitus without complications: Secondary | ICD-10-CM | POA: Diagnosis not present

## 2024-03-03 ENCOUNTER — Ambulatory Visit (HOSPITAL_COMMUNITY)
Admission: RE | Admit: 2024-03-03 | Discharge: 2024-03-03 | Disposition: A | Discharge status: Home / Self Care | Source: Ambulatory Visit | Attending: Student in an Organized Health Care Education/Training Program | Admitting: Student in an Organized Health Care Education/Training Program

## 2024-03-03 DIAGNOSIS — Z952 Presence of prosthetic heart valve: Secondary | ICD-10-CM | POA: Insufficient documentation

## 2024-03-03 DIAGNOSIS — I350 Nonrheumatic aortic (valve) stenosis: Secondary | ICD-10-CM | POA: Diagnosis not present

## 2024-03-03 DIAGNOSIS — I5042 Chronic combined systolic (congestive) and diastolic (congestive) heart failure: Secondary | ICD-10-CM | POA: Insufficient documentation

## 2024-03-03 LAB — ECHOCARDIOGRAM COMPLETE
AV Mean grad: 7.6 mmHg
AV Peak grad: 14 mmHg
Ao pk vel: 1.87 m/s
Area-P 1/2: 2.95 cm2
S' Lateral: 3 cm

## 2024-03-03 MED ORDER — PERFLUTREN LIPID MICROSPHERE
1.0000 mL | INTRAVENOUS | Status: AC | PRN
  Administered 2024-03-03: 1 mL via INTRAVENOUS

## 2024-03-12 ENCOUNTER — Encounter: Payer: Self-pay | Admitting: Student in an Organized Health Care Education/Training Program

## 2024-03-12 ENCOUNTER — Ambulatory Visit
Attending: Student in an Organized Health Care Education/Training Program | Admitting: Student in an Organized Health Care Education/Training Program

## 2024-03-12 ENCOUNTER — Encounter: Payer: Self-pay | Admitting: *Deleted

## 2024-03-12 DIAGNOSIS — I5022 Chronic systolic (congestive) heart failure: Secondary | ICD-10-CM

## 2024-03-12 DIAGNOSIS — I442 Atrioventricular block, complete: Secondary | ICD-10-CM | POA: Diagnosis not present

## 2024-03-12 DIAGNOSIS — Z01812 Encounter for preprocedural laboratory examination: Secondary | ICD-10-CM | POA: Diagnosis not present

## 2024-03-12 NOTE — Patient Instructions (Addendum)
 Medication Instructions:  Your physician recommends that you continue on your current medications as directed. Please refer to the Current Medication list given to you today.  *If you need a refill on your cardiac medications before your next appointment, please call your pharmacy*  Lab Work: Your physician recommends that you return for lab work  between 12/01 - 12/05.  You do NOT need to be fasting.   If you have any lab test that is abnormal or we need to change your treatment, we will call you to review the results.  Testing/Procedures: You are scheduled for an upgrade to a BiVentricular pacemaker.  You will be scheduled for 12/9, please see instruction letter given to you today.   Follow-Up: At [facility], you and your health needs are our priority.  As part of our continuing mission to provide you with exceptional heart care, our providers are all part of one team.  This team includes your primary Cardiologist (physician) and Advanced Practice Providers or APPs (Physician Assistants and Nurse Practitioners) who all work together to provide you with the care you need, when you need it.  Your next appointment:   2 week(s)  Provider:   Device clinic for a wound check   Your physician recommends that you schedule a follow-up appointment 3 months    Thank you for choosing [facility]!!   [phone]   Other Instructions   Biventricular Pacemaker Implantation Biventricular pacemaker implantation is a procedure to place (implant) a pacemaker near the heart. A biventricular pacemaker is a type of pacemaker used in people with heart failure due to weak heart muscles. This procedure may be done to: Treat symptoms of severe heart failure, such as shortness of breath. Correct a heartbeat that may be too slow or irregular. A pacemaker is a small, battery-powered device that helps control the heartbeat. If the heart beats irregularly or too slowly, the pacemaker will  pace the heart so that it beats at a normal rate or a programmed rate. A biventricular pacemaker will synchronize the lower chambers of the heart (ventricles) so they contract at the same time. This can help them pump more efficiently. The parts of a biventricular pacemaker include: The pulse generator. The pulse generator contains a small computer that is programmed to keep the heart beating at a certain rate. The pulse generator also produces the electrical signal that triggers the heart to beat. This is implanted under the skin of the upper chest, near the collarbone. Wires (leads). There may be two or three leads placed in the heart--one to the right atrium, one to the right ventricle, and one through the coronary sinus to reach the left ventricle of the heart. The leads are connected to the pulse generator. They transmit electrical pulses from the pulse generator to the heart. Tell a health care provider about: Any allergies you have. All medicines you are taking, including vitamins, herbs, eye drops, creams, and over-the-counter medicines. Any problems you or family members have had with anesthetic medicines. Any bleeding problems you have. Any surgeries you have had. Any medical conditions you have. Whether you are pregnant or may be pregnant. What are the risks? Generally, this is a safe procedure. However, problems may occur, including: Infection. Swelling, bruising, or bleeding at the pacemaker site, especially if you take blood thinners. Allergic reactions to medicines or dyes. Damage to blood vessels or nerves near the pacemaker. Failure of the pacemaker to improve your condition. Collapsed lung. Blood clots. Lead failures.  This may require more surgery. What happens before the procedure? Staying hydrated Follow instructions from your health care provider about hydration, which may include: Up to 2 hours before the procedure - you may continue to drink clear liquids, such as  water , clear fruit juice, black coffee, and plain tea.  Eating and drinking restrictions Follow instructions from your health care provider about eating and drinking, which may include: 8 hours before the procedure - stop eating heavy meals or foods, such as meat, fried foods, or fatty foods. 6 hours before the procedure - stop eating light meals or foods, such as toast or cereal. 6 hours before the procedure - stop drinking milk or drinks that contain milk. 2 hours before the procedure - stop drinking clear liquids. Medicines Ask your health care provider about: Changing or stopping your regular medicines. This is especially important if you are taking diabetes medicines or blood thinners. Taking medicines such as aspirin  and ibuprofen . These medicines can thin your blood. Do not take these medicines unless your health care provider tells you to take them. Taking over-the-counter medicines, vitamins, herbs, and supplements. General instructions Take steps to improve your health and fitness as told. Stopping smoking, eating a healthy diet, and exercising regularly can help speed up your recovery time and reduce the risk of complications. You may have tests, including: Blood tests. Chest X-rays. Echocardiogram. This is a test that uses sound waves (ultrasound) to produce an image of the heart. Ask your health care provider: How your surgery site will be marked. What steps will be taken to help prevent infection. These may include: Removing hair at the surgery site. Washing skin with a germ-killing soap. Taking antibiotic medicine. Plan to have a responsible adult take you home from the hospital or clinic. If you will be going home right after the procedure, plan to have a responsible adult care for you for the time you are told. This is important. What happens during the procedure?  An IV will be inserted into one of your veins. You will be connected to a heart monitor. Large electrode  pads will be placed on the front and back of your chest. You will be given one or more of the following: A medicine to help you relax (sedative). A medicine to make you fall asleep (general anesthetic). A medicine that is injected into an area of your body to numb the area (local anesthetic). An incision will be made in your upper chest, near your heart. The leads will be guided into your incision, through your blood vessels, and into your heart. Your health care provider will use an X-ray machine (fluoroscope) to guide the leads into your heart. The leads will be attached to your heart muscles and to the pulse generator. The leads will be tested to make sure that they work correctly. The pulse generator will be implanted under your skin, near your incision. The heart monitor will be watched to ensure that the pacemaker is working correctly. Your incision will be closed with stitches (sutures), skin glue, or adhesive tape. A bandage (dressing) will be placed over your incision. The procedure may vary among health care providers and hospitals. What happens after the procedure? Your blood pressure, heart rate, breathing rate, and blood oxygen level will be monitored until you leave the hospital or clinic. You may continue to receive fluids and medicines through an IV. You will be given pain medicine as needed. You will have a chest X-ray done. This is to make  sure that your pacemaker is in the right place. You will be given a pacemaker identification card. This card lists the implant date, device model, and manufacturer of your pacemaker. If you were given a sedative during the procedure, it can affect you for several hours. Do not drive or operate machinery until your health care provider says that it is safe. Summary A pacemaker is a small, battery-powered device that helps control the heartbeat. A biventricular pacemaker is used in people with heart failure to get the ventricles of the heart  to pump more efficiently. Follow instructions from your health care provider about taking medicines and about eating and drinking before the procedure. You will be given a pacemaker identification card that lists the implant date, device model, and manufacturer of your pacemaker. This information is not intended to replace advice given to you by your health care provider. Make sure you discuss any questions you have with your health care provider. Document Revised: 08/12/2020 Document Reviewed: 08/12/2020 Elsevier Patient Education  2024 [employer].

## 2024-03-12 NOTE — Progress Notes (Signed)
 Cardiology Office Note   Date: 03/12/24 ID:  Tony [name], DOB 03/13/1951, MRN [id number] PCP: Ransom Other, MD  [facility] Providers Cardiologist:  Georganna Archer, MD Electrophysiologist:  Donnice DELENA Primus, MD   History of Present Illness Tony [name] is a 73 y.o. 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   Discussed the use of AI scribe software for clinical note transcription with the patient, who gave verbal consent to proceed.  History of Present Illness His heart function on the most recent echocardiogram shows an ejection fraction of 45%. He has a traditional Tony [name] with apical RV lead. His heart function had declined in 2021, with a noted drop last year.  I reviewed his TTE which only looks mildly improved from prior when his LVEF was between 30-35%.   He experiences fatigue when walking long distances, such as to the cafeteria, but generally has 'good days and bad days.' He can walk to the dining room and handle daily activities, though he feels tired after walking back to his room. No significant shortness of breath during normal activities. He acknowledges the need for more exercise to improve his stamina.  His daily routine includes going to breakfast, returning to his room to rest, and engaging in leisure activities like watching movies. He has maintained his weight around 240 pounds, having gained 20 pounds initially upon moving to his current residence at [facility], where he lives independently with some assistance for cleaning and laundry.  He has a history of a heart valve replacement and a complete heart block that led to the initial pacemaker placement. He recalls being in cardiac rehab when the heart block was identified, leading to the pacemaker insertion the following day.  His daughter is  an anesthesiologist (Dr. Duwaine Lares Irving) who works in cardiothoracic anesthesiology in adult critical care at [employer].  ROS: fatigue   Studies Reviewed  ECG  01/23/24: ASVP 64, PR 194, QRS 198, QT/c 492/507 01/22/23: ASVP 80, PR 194, QRS 203, QT/c 472/545 09/16/22: ASVP 80, PR 197, QRS 199, QT/c 447/516 08/30/21: ASVP 73, PR 192, QRS 198, QT/c 476/524 08/29/21: ASVP 60, PR 168, QRS 224, QT/c 548/548 08/07/21: ASVP 82, PR 202, QRS 202, QT/c 464/542 08/12/14: ASVP 60, PR 186, QRS 190, QT/c 498/498  08/05/14: 2:1 AVB, VR 44, QRS 132, QT/c 466/398  TTE Result date: 03/03/24  1. Tony ventricular ejection fraction, by estimation, is 45 to 50%. The  Tony ventricle has mildly decreased function. The Tony ventricle  demonstrates regional wall motion abnormalities (see scoring  diagram/findings for description). There is severe  concentric Tony ventricular hypertrophy. Tony ventricular diastolic  parameters are consistent with Grade II diastolic dysfunction  (pseudonormalization). Elevated Tony atrial pressure. There is  akinesis/severe HK of the Tony ventricular, apical septal  wall, anterior wall and inferior wall. There is akinesis/severe HK of the  Tony ventricular, apical segment.   2. Right ventricular systolic function is normal. The right ventricular  size is mildly enlarged.   3. Tony atrial size was mildly dilated.   4. The mitral valve is degenerative. Trivial mitral valve regurgitation.  No evidence of mitral stenosis.   5. The aortic valve is normal in structure. Aortic valve regurgitation is  not visualized. No aortic stenosis is present. Aortic valve mean gradient  measures 7.6 mmHg. Aortic valve  Vmax measures 1.87 m/s.   6. Aortic dilatation noted. There is mild dilatation of the ascending  aorta, measuring 40 mm.   7. The inferior vena cava is normal in size with greater than 50%  respiratory variability, suggesting right atrial pressure of 3 mmHg.   TTE Result  date: 08/22/22  1. Akinesis of the septum, distal inferior wall and apex; overall  moderate to severe LV dysfunction; s/p TAVR with mean gradient 4 mmHg, DI  0.53 and no AI; compared to 03/23/22 LV function is worse and pericardial  effusion slightly larger.   2. Tony ventricular ejection fraction, by estimation, is 30 to 35%. The  Tony ventricle has moderate to severely decreased function. The Tony  ventricle demonstrates regional wall motion abnormalities (see scoring  diagram/findings for description). There  is mild concentric Tony ventricular hypertrophy. Tony ventricular  diastolic parameters are consistent with Grade I diastolic dysfunction  (impaired relaxation). Elevated Tony atrial pressure.   3. Right ventricular systolic function is normal. The right ventricular  size is normal.   4. Tony atrial size was mildly dilated.   5. A small pericardial effusion is present.   6. The mitral valve is normal in structure. Mild mitral valve  regurgitation. No evidence of mitral stenosis.   7. The aortic valve has been repaired/replaced. Aortic valve  regurgitation is not visualized. No aortic stenosis is present. There is a  29 mm Sapien prosthetic (TAVR) valve present in the aortic position.  Procedure Date: 08/29/2021. Echo findings are  consistent with normal structure and function of the aortic valve  prosthesis.   8. Aortic dilatation noted. There is mild dilatation of the ascending  aorta, measuring 40 mm.       Wt Readings from Last 3 Encounters:  01/23/24 237 lb 9.6 oz (107.8 kg)  04/24/23 235 lb 3.7 oz (106.7 kg)  01/24/23 244 lb 14.9 oz (111.1 kg)    GEN: Well nourished, well developed in no acute distress NECK: No JVD; No carotid bruits CARDIAC: RRR, no murmurs, rubs, gallops RESPIRATORY:  Clear to auscultation without rales, wheezing or rhonchi  EXTREMITIES:  No edema; No deformity  SKIN: Tony infraclavicular incision well healed, device without erythema, erosion or  swelling   Device information  [name]: MDT Andrew LITTIE ALVINE, SN [id number], DOI 08/11/14 RA: MDT 4923, SN [id number], DOI 08/11/14 RV: MDT 4923, SN [id number], DOI 08/11/14  ASSESSMENT AND PLAN Tony [name] is a 73 y.o. 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   HFrEF CHB with 100% VP NYHA class II  I reviewed his prior TTE and the most recent.  He still has reduced LVEF in the context of chronic RV pacing.  Symptoms are hard to address as his functional baseline is somewhat limited although he is still independent with his ADLs at the facility that he lives at.  He does get some fatigue/generalized malaise with more than mild activity. Also with DOE with anything more than minimal exertion.  I reviewed different options for management of his device and whether we should consider upgrade to CRT. He is on GDMT.  I think that he could benefit from CRT and that at least would mitigate the chance of further decline in his LVEF from chronic RV pacing.  I reviewed this with his daughter who is a cardiac anesthesiologist and she is well  agreed that upgrade to CRT would be within his goals of care and reasonable.  He meets Block-HF criteria for device upgrade from DC [name] to CRT with 2b indication with HF sx and chronic RV pacing.  We reviewed that the main risk is related to infection with device upgrade and other considerations are anesthesia, device site pain, damage to lungs or heart wall requiring emergency drain and/or open median sternotomy, damage to existing leads, lead perforation, or lead dislodgment, arrhythmias and death.  He and his daughter were agreeable to these risk and would like to proceed.  Procedure details: Procedure date: 04/07/24 OP medications: Hold plavix  x48 hours, (LD 12/07) Vendor: MDT DC [name]->CRTP (add LV lead)  Moderate sedation,  GA not indicated      Dispo: RTC post MDT DC [name]->CRTP upgrade.   A total of 30 minutes was spent preparing for the patient, reviewing history, performing exam, document encounter, coordinating care and counseling the patient. 15 minutes was spent with direct patient care.   Signed, Donnice DELENA Primus, MD

## 2024-03-12 NOTE — H&P (View-Only) (Signed)
 Cardiology Office Note   Date: 03/12/24 ID:  Tony [name], DOB 03/13/1951, MRN [id number] PCP: Ransom Other, MD  [facility] Providers Cardiologist:  Georganna Archer, MD Electrophysiologist:  Donnice DELENA Primus, MD   History of Present Illness Tony [name] is a 73 y.o. 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   Discussed the use of AI scribe software for clinical note transcription with the patient, who gave verbal consent to proceed.  History of Present Illness His heart function on the most recent echocardiogram shows an ejection fraction of 45%. He has a traditional Tony [name] with apical RV lead. His heart function had declined in 2021, with a noted drop last year.  I reviewed his TTE which only looks mildly improved from prior when his LVEF was between 30-35%.   He experiences fatigue when walking long distances, such as to the cafeteria, but generally has 'good days and bad days.' He can walk to the dining room and handle daily activities, though he feels tired after walking back to his room. No significant shortness of breath during normal activities. He acknowledges the need for more exercise to improve his stamina.  His daily routine includes going to breakfast, returning to his room to rest, and engaging in leisure activities like watching movies. He has maintained his weight around 240 pounds, having gained 20 pounds initially upon moving to his current residence at [facility], where he lives independently with some assistance for cleaning and laundry.  He has a history of a heart valve replacement and a complete heart block that led to the initial pacemaker placement. He recalls being in cardiac rehab when the heart block was identified, leading to the pacemaker insertion the following day.  His daughter is  an anesthesiologist (Dr. Duwaine Lares Irving) who works in cardiothoracic anesthesiology in adult critical care at [employer].  ROS: fatigue   Studies Reviewed  ECG  01/23/24: ASVP 64, PR 194, QRS 198, QT/c 492/507 01/22/23: ASVP 80, PR 194, QRS 203, QT/c 472/545 09/16/22: ASVP 80, PR 197, QRS 199, QT/c 447/516 08/30/21: ASVP 73, PR 192, QRS 198, QT/c 476/524 08/29/21: ASVP 60, PR 168, QRS 224, QT/c 548/548 08/07/21: ASVP 82, PR 202, QRS 202, QT/c 464/542 08/12/14: ASVP 60, PR 186, QRS 190, QT/c 498/498  08/05/14: 2:1 AVB, VR 44, QRS 132, QT/c 466/398  TTE Result date: 03/03/24  1. Tony ventricular ejection fraction, by estimation, is 45 to 50%. The  Tony ventricle has mildly decreased function. The Tony ventricle  demonstrates regional wall motion abnormalities (see scoring  diagram/findings for description). There is severe  concentric Tony ventricular hypertrophy. Tony ventricular diastolic  parameters are consistent with Grade II diastolic dysfunction  (pseudonormalization). Elevated Tony atrial pressure. There is  akinesis/severe HK of the Tony ventricular, apical septal  wall, anterior wall and inferior wall. There is akinesis/severe HK of the  Tony ventricular, apical segment.   2. Right ventricular systolic function is normal. The right ventricular  size is mildly enlarged.   3. Tony atrial size was mildly dilated.   4. The mitral valve is degenerative. Trivial mitral valve regurgitation.  No evidence of mitral stenosis.   5. The aortic valve is normal in structure. Aortic valve regurgitation is  not visualized. No aortic stenosis is present. Aortic valve mean gradient  measures 7.6 mmHg. Aortic valve  Vmax measures 1.87 m/s.   6. Aortic dilatation noted. There is mild dilatation of the ascending  aorta, measuring 40 mm.   7. The inferior vena cava is normal in size with greater than 50%  respiratory variability, suggesting right atrial pressure of 3 mmHg.   TTE Result  date: 08/22/22  1. Akinesis of the septum, distal inferior wall and apex; overall  moderate to severe LV dysfunction; s/p TAVR with mean gradient 4 mmHg, DI  0.53 and no AI; compared to 03/23/22 LV function is worse and pericardial  effusion slightly larger.   2. Tony ventricular ejection fraction, by estimation, is 30 to 35%. The  Tony ventricle has moderate to severely decreased function. The Tony  ventricle demonstrates regional wall motion abnormalities (see scoring  diagram/findings for description). There  is mild concentric Tony ventricular hypertrophy. Tony ventricular  diastolic parameters are consistent with Grade I diastolic dysfunction  (impaired relaxation). Elevated Tony atrial pressure.   3. Right ventricular systolic function is normal. The right ventricular  size is normal.   4. Tony atrial size was mildly dilated.   5. A small pericardial effusion is present.   6. The mitral valve is normal in structure. Mild mitral valve  regurgitation. No evidence of mitral stenosis.   7. The aortic valve has been repaired/replaced. Aortic valve  regurgitation is not visualized. No aortic stenosis is present. There is a  29 mm Sapien prosthetic (TAVR) valve present in the aortic position.  Procedure Date: 08/29/2021. Echo findings are  consistent with normal structure and function of the aortic valve  prosthesis.   8. Aortic dilatation noted. There is mild dilatation of the ascending  aorta, measuring 40 mm.       Wt Readings from Last 3 Encounters:  01/23/24 237 lb 9.6 oz (107.8 kg)  04/24/23 235 lb 3.7 oz (106.7 kg)  01/24/23 244 lb 14.9 oz (111.1 kg)    GEN: Well nourished, well developed in no acute distress NECK: No JVD; No carotid bruits CARDIAC: RRR, no murmurs, rubs, gallops RESPIRATORY:  Clear to auscultation without rales, wheezing or rhonchi  EXTREMITIES:  No edema; No deformity  SKIN: Tony infraclavicular incision well healed, device without erythema, erosion or  swelling   Device information  [name]: MDT Andrew LITTIE ALVINE, SN [id number], DOI 08/11/14 RA: MDT 4923, SN [id number], DOI 08/11/14 RV: MDT 4923, SN [id number], DOI 08/11/14  ASSESSMENT AND PLAN MACALISTER ARNAUD is a 73 y.o. 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   HFrEF CHB with 100% VP NYHA class II  I reviewed his prior TTE and the most recent.  He still has reduced LVEF in the context of chronic RV pacing.  Symptoms are hard to address as his functional baseline is somewhat limited although he is still independent with his ADLs at the facility that he lives at.  He does get some fatigue/generalized malaise with more than mild activity. Also with DOE with anything more than minimal exertion.  I reviewed different options for management of his device and whether we should consider upgrade to CRT. He is on GDMT.  I think that he could benefit from CRT and that at least would mitigate the chance of further decline in his LVEF from chronic RV pacing.  I reviewed this with his daughter who is a cardiac anesthesiologist and she is well  agreed that upgrade to CRT would be within his goals of care and reasonable.  He meets Block-HF criteria for device upgrade from DC [name] to CRT with 2b indication with HF sx and chronic RV pacing.  We reviewed that the main risk is related to infection with device upgrade and other considerations are anesthesia, device site pain, damage to lungs or heart wall requiring emergency drain and/or open median sternotomy, damage to existing leads, lead perforation, or lead dislodgment, arrhythmias and death.  He and his daughter were agreeable to these risk and would like to proceed.  Procedure details: Procedure date: 04/07/24 OP medications: Hold plavix  x48 hours, (LD 12/07) Vendor: MDT DC [name]->CRTP (add LV lead)  Moderate sedation,  GA not indicated      Dispo: RTC post MDT DC [name]->CRTP upgrade.   A total of 30 minutes was spent preparing for the patient, reviewing history, performing exam, document encounter, coordinating care and counseling the patient. 15 minutes was spent with direct patient care.   Signed, Donnice DELENA Primus, MD

## 2024-03-17 ENCOUNTER — Encounter (HOSPITAL_COMMUNITY): Payer: Self-pay

## 2024-03-25 IMAGING — CT CT CTA ABD/PEL W/CM AND/OR W/O CM
1 series · 16 of 18 positions shown · non-contrast
Comparison: None.

CLINICAL DATA: Preop TAVR evaluation

EXAM:
CT ANGIOGRAPHY CHEST, ABDOMEN AND PELVIS
TECHNIQUE: Non-contrast CT of the chest was initially obtained.

[Series 3514: measurements · 16 of 18 slices shown]
[im 2/18  lung]
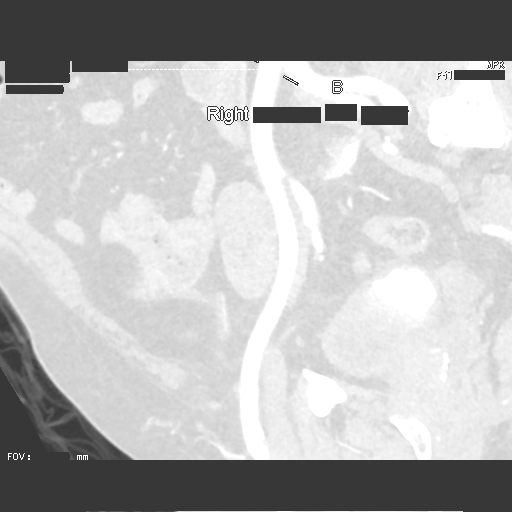
[im 3/18  mediastinal]
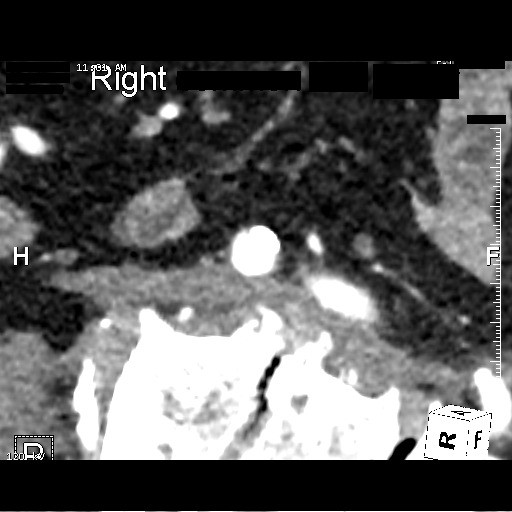
[im 4/18  lung]
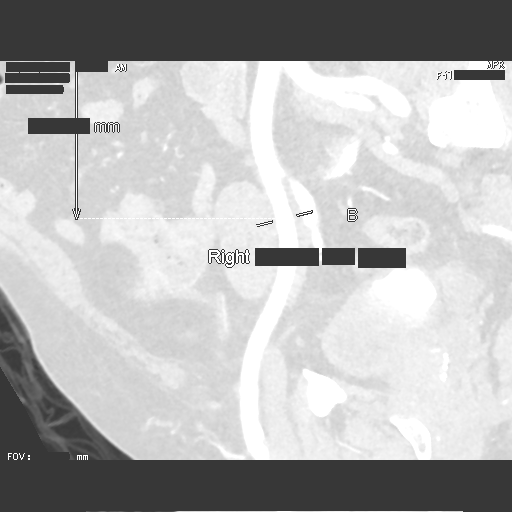
[im 5/18  mediastinal]
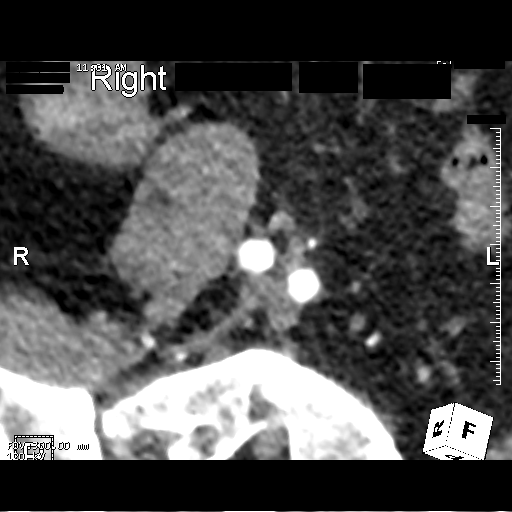
[im 6/18  lung]
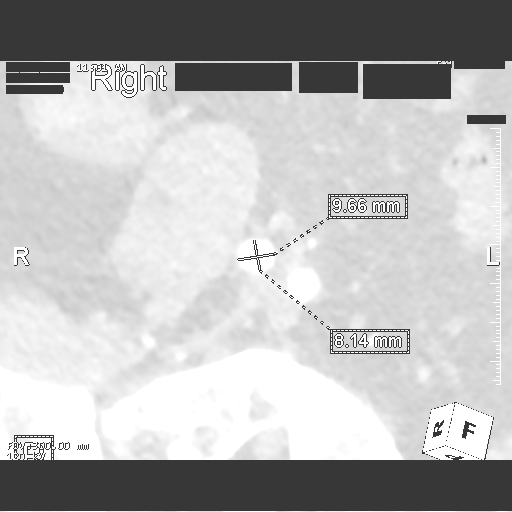
[im 7/18  mediastinal]
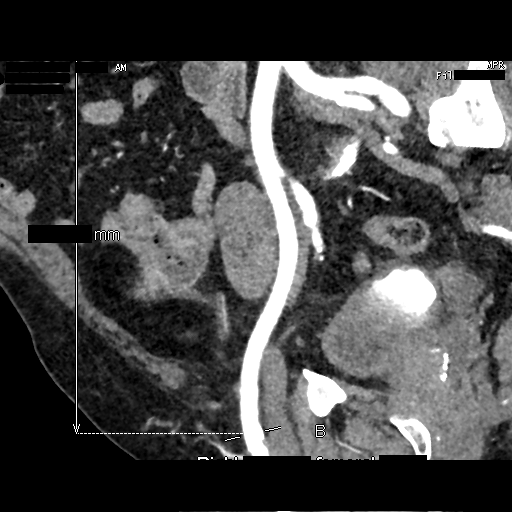
[im 8/18  lung]
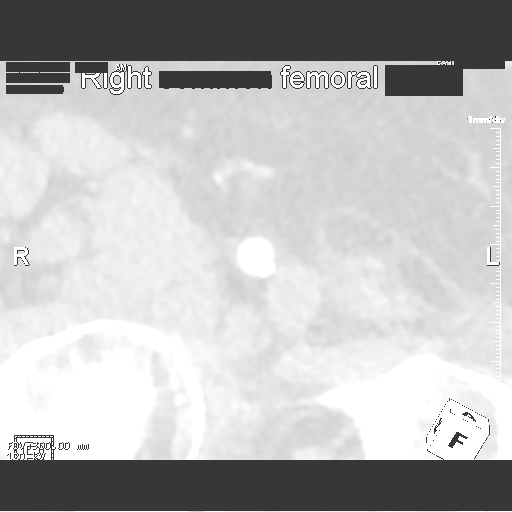
[im 9/18  mediastinal]
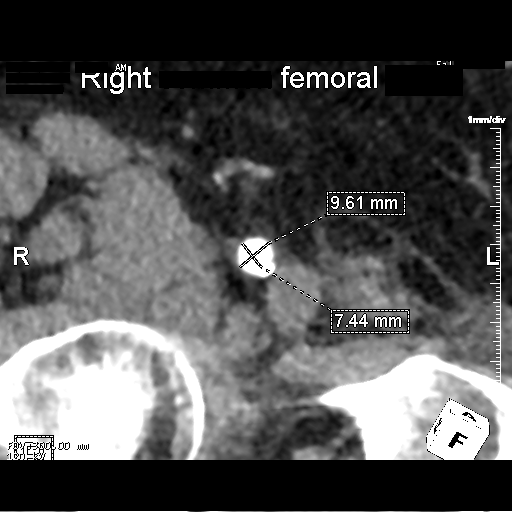
[im 10/18  lung]
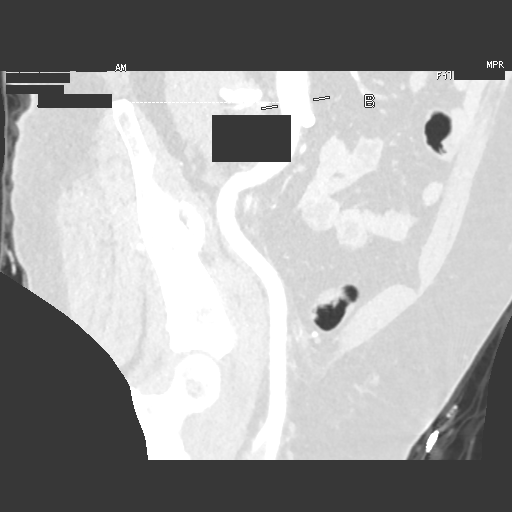
[im 11/18  mediastinal]
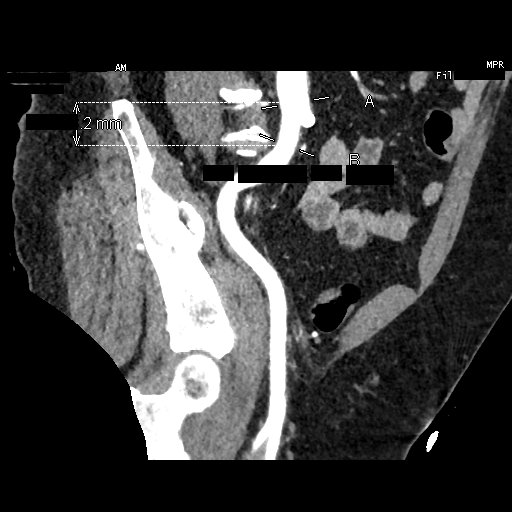
[im 12/18  lung]
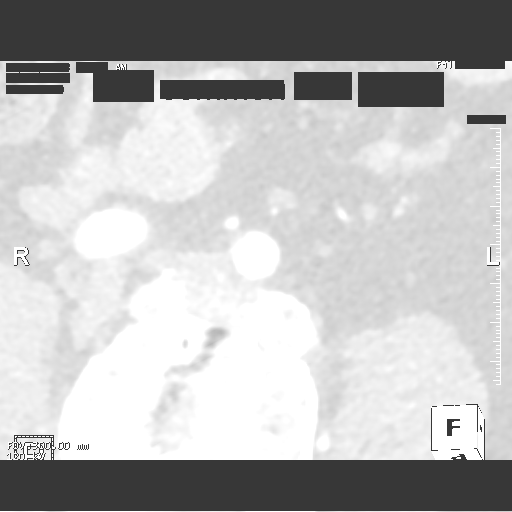
[im 13/18  mediastinal]
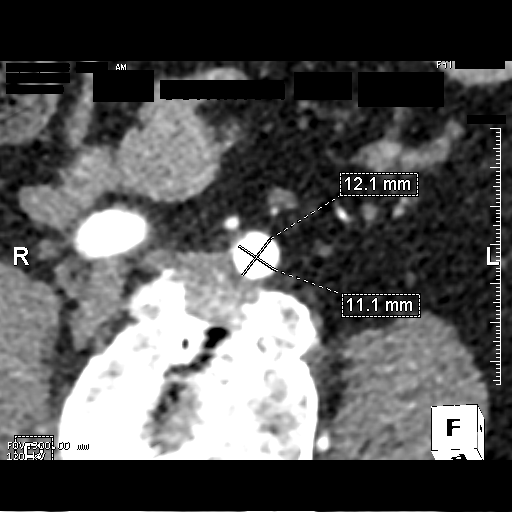
[im 14/18  lung]
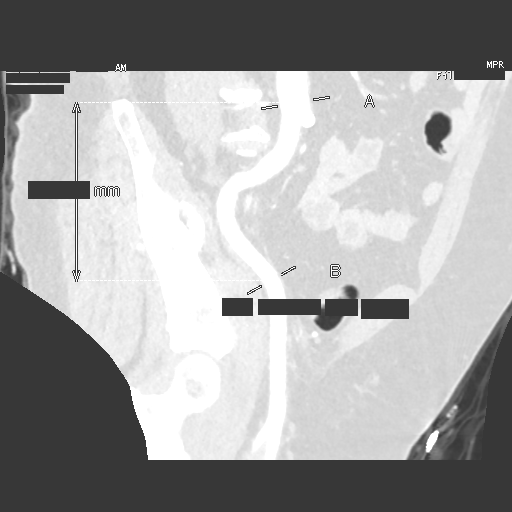
[im 15/18  mediastinal]
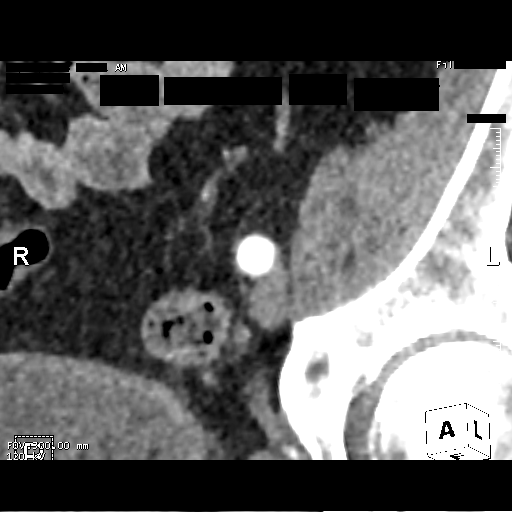
[im 16/18  lung]
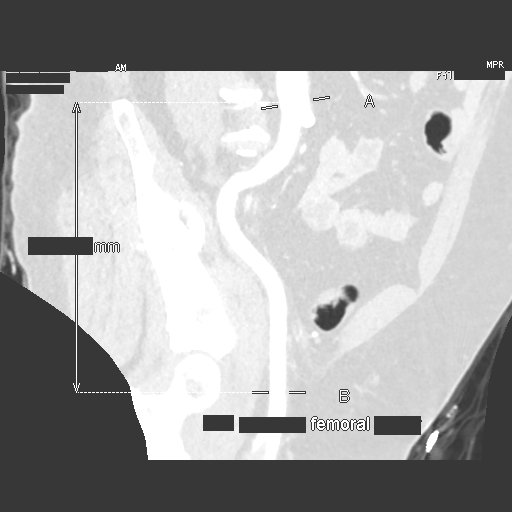
[im 17/18  mediastinal]
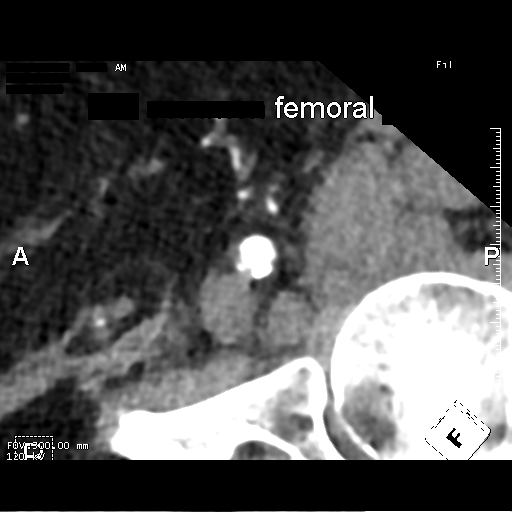

[16 of 18 positions shown; findings below may reference images not displayed]

Multidetector CT imaging through the chest, abdomen and pelvis was
performed using the standard protocol during bolus administration of
intravenous contrast. Multiplanar reconstructed images and MIPs were
obtained and reviewed to evaluate the vascular anatomy.

RADIATION DOSE REDUCTION: This exam was performed according to the
departmental dose-optimization program which includes automated
exposure control, adjustment of the mA and/or kV according to
patient size and/or use of iterative reconstruction technique.

CONTRAST:  100mL OMNIPAQUE IOHEXOL 350 MG/ML SOLN
FINDINGS: CTA CHEST FINDINGS

Cardiovascular: Cardiomegaly. No pericardial effusion. Left chest
wall dual lead pacer with leads positioned in the right atrium and
right ventricle. Severe aortic valve calcifications and thickening.
Mild atherosclerotic disease of the thoracic aorta. Left main and
three-vessel coronary artery calcifications. No suspicious filling
defects of the main pulmonary arteries.

Mediastinum/Nodes: Esophagus is unremarkable. No pathologically
enlarged lymph nodes seen in the chest.

Lungs/Pleura: Central airways are patent. No consolidation, pleural
effusion or pneumothorax. Solid pulmonary nodule the left upper lobe
measuring 5 mm on series 5, image 34.

Musculoskeletal: No chest wall abnormality. No acute or significant
osseous findings.

CTA ABDOMEN AND PELVIS FINDINGS

Portions of the right and left lateral abdomen are excluded from the
field of view which somewhat limits evaluation.

Hepatobiliary: No focal liver abnormality is seen. No gallstones,
gallbladder wall thickening, or biliary dilatation.

Pancreas: Unremarkable. No pancreatic ductal dilatation or
surrounding inflammatory changes.

Spleen: Normal in size without focal abnormality.

Adrenals/Urinary Tract: Adrenal glands are unremarkable. Kidneys are
normal, without renal calculi, focal lesion, or hydronephrosis.
Bladder is unremarkable.

Stomach/Bowel: Stomach is within normal limits. Appendix appears
normal. No evidence of bowel wall thickening, distention, or
inflammatory changes.

Vascular/lymphatic: Normal caliber abdominal aorta with mild
atherosclerotic disease. Mild narrowing of the SMA due to calcified
and noncalcified plaque. Otherwise, major aortic branch vessels are
widely patent. No pathologically enlarged lymph nodes seen in the
abdomen or pelvis.

Reproductive: Prostatomegaly.

Other: No abdominopelvic ascites.

Musculoskeletal: No acute or significant osseous findings.

VASCULAR MEASUREMENTS PERTINENT TO TAVR:

AORTA:

Minimal Aortic 9iameter-QN.J mm

Severity of Aortic Calcification-mild

RIGHT PELVIS:

Right Common Iliac Artery -

Minimal Tiameter-3O.O mm

Tortuosity-none

Calcification-mild

Right External Iliac Artery -

Minimal 5iameter-1.V mm

Tortuosity-moderate

Calcification-none

Right Common Femoral Artery -

Minimal 8iameter-8.7 mm

Tortuosity-none

Calcification-mild

LEFT PELVIS:

Left Common Iliac Artery -

Minimal Miameter-AA.A mm

Tortuosity-none

Calcification-mild

Left External Iliac Artery -

Minimal Giameter-7.4 mm

Tortuosity-moderate

Calcification-none

Left Common Femoral Artery -

Minimal 1iameter-A.Y mm

Tortuosity-none

Calcification-mild

Review of the MIP images confirms the above findings.
IMPRESSION: Vascular:

1. Vascular findings and measurements pertinent to potential TAVR
procedure, as detailed above.
2. Severe thickening calcification of the aortic valve, compatible
with reported clinical history of severe aortic stenosis.
3. Mild aortoiliac calcific atherosclerosis. Left main and 3 vessel
coronary artery disease.

Nonvascular:

1. Solid pulmonary nodule the left upper lobe measuring 5 mm. No
follow-up needed if patient is low-risk.This recommendation follows
the consensus statement: Guidelines for Management of Incidental
Pulmonary Nodules Detected on CT Images: From the [HOSPITAL]

## 2024-03-25 IMAGING — CT CT HEART MORP W/ CTA COR W/ SCORE W/ CA W/CM &/OR W/O CM
1 series · 12 of 14 positions shown, 15 images · non-contrast
Comparison: None.
COMPARISON: None.

Addendum:
EXAM:
OVER-READ INTERPRETATION  CT CHEST

The following report is an over-read performed by radiologist Dr.
Gwaabe Saa [REDACTED] on 08/14/2021. This
over-read does not include interpretation of cardiac or coronary
anatomy or pathology. The coronary calcium score/coronary CTA
interpretation by the cardiologist is attached.
CLINICAL DATA: 71M with severe aortic stenosis being evaluated for
a TAVR procedure.
Cardiac TAVR CT
TECHNIQUE: The patient was scanned on a Phillips Force scanner. A 120 kV
retrospective scan was triggered in the descending thoracic aorta at
111 HU's. Gantry rotation speed was 250 msecs and collimation was .6
mm. No beta blockade or nitro were given. The 3D data set was
reconstructed in 5% intervals of the R-R cycle. Systolic and
diastolic phases were analyzed on a dedicated work station using
MPR, MIP and VRT modes. The patient received 80 cc of contrast.

[Series 3547: tavr · 0.41mm/px · 12 of 14 slices shown, 15 images]
[im 2/14  vessel]
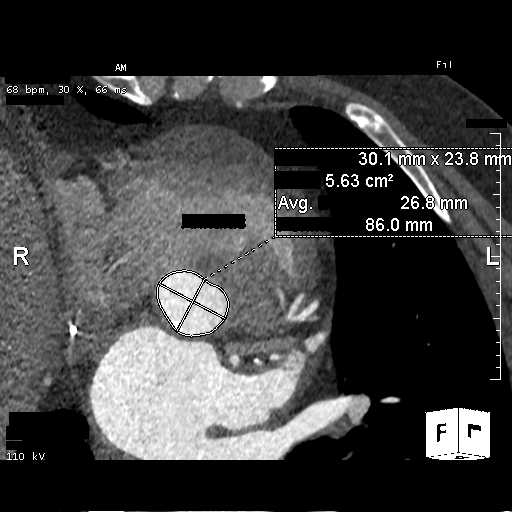
[im 2/14  lung]
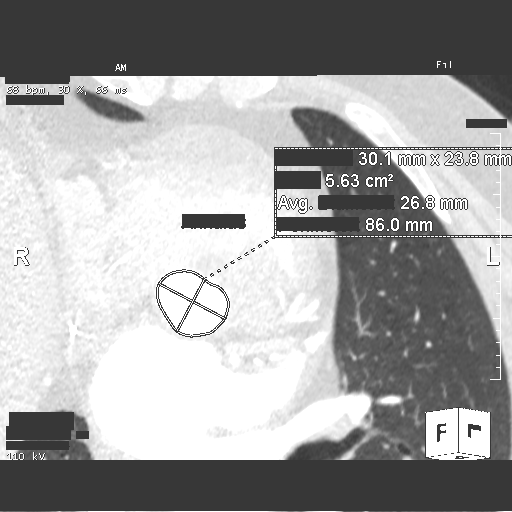
[im 3/14  vessel]
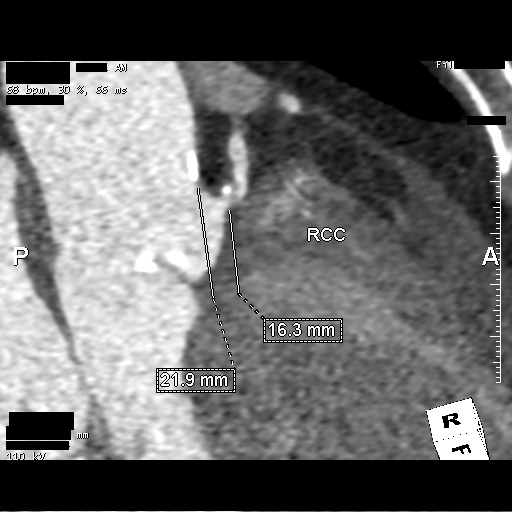
[im 4/14  vessel]
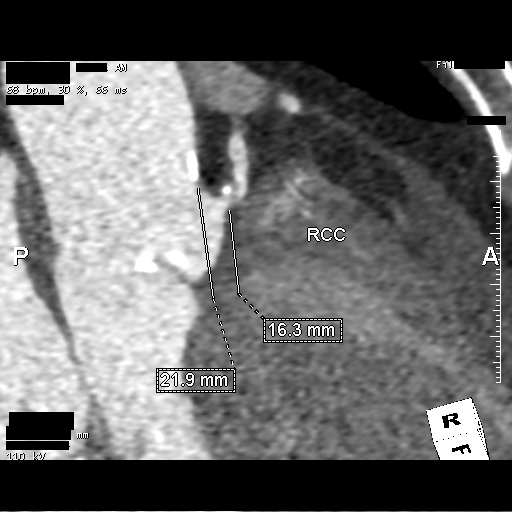
[im 5/14  vessel]
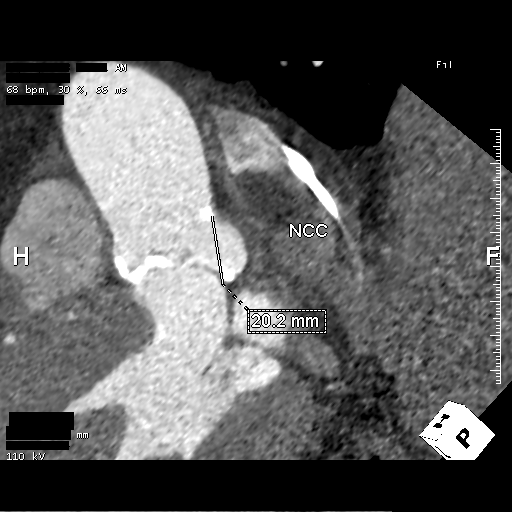
[im 6/14  vessel]
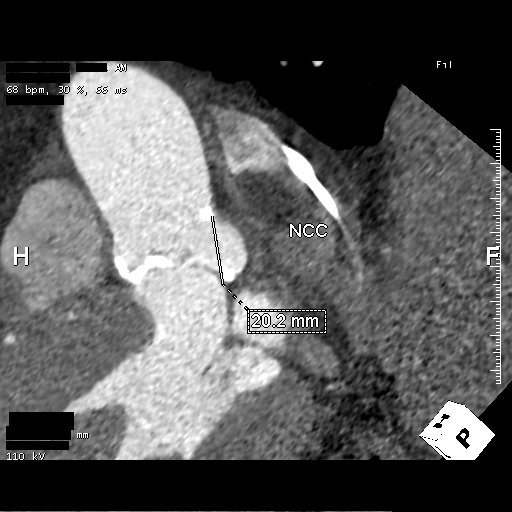
[im 6/14  lung]
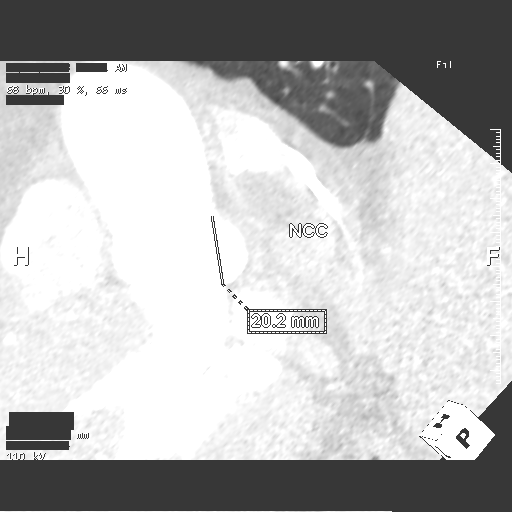
[im 7/14  vessel]
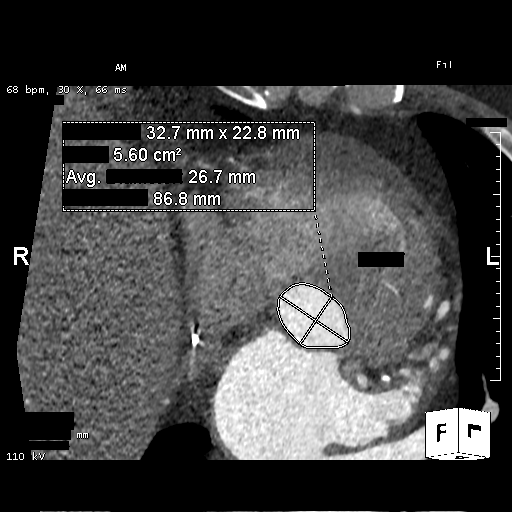
[im 8/14  vessel]
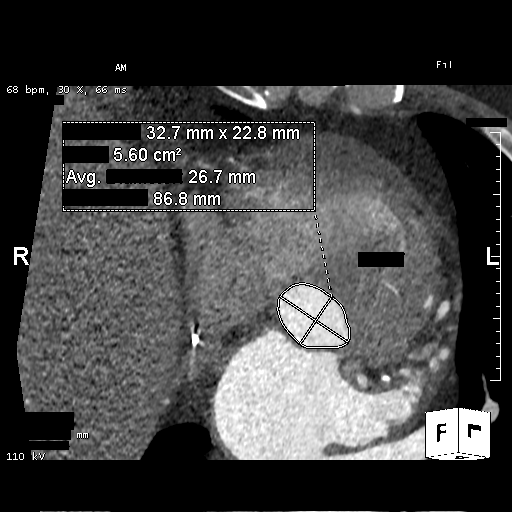
[im 9/14  vessel]
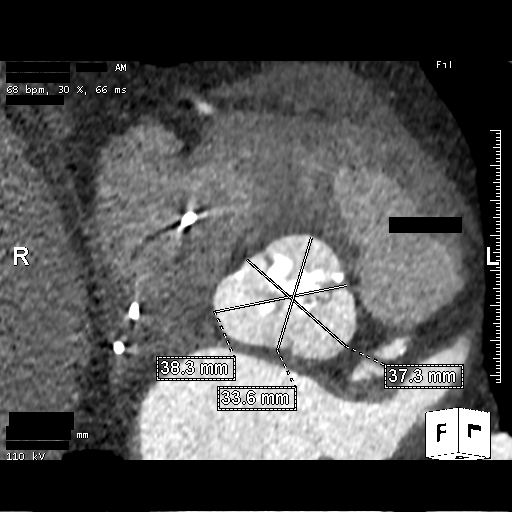
[im 10/14  vessel]
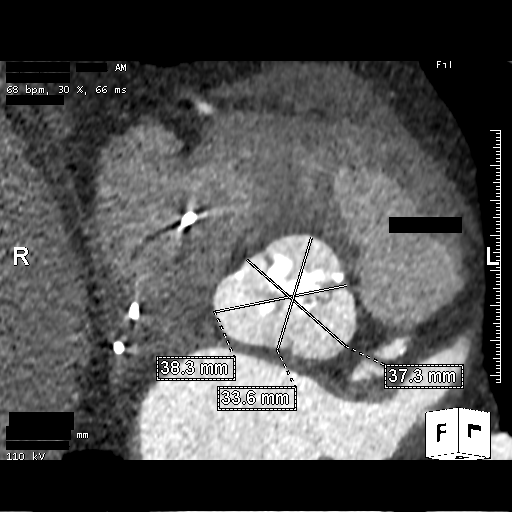
[im 10/14  lung]
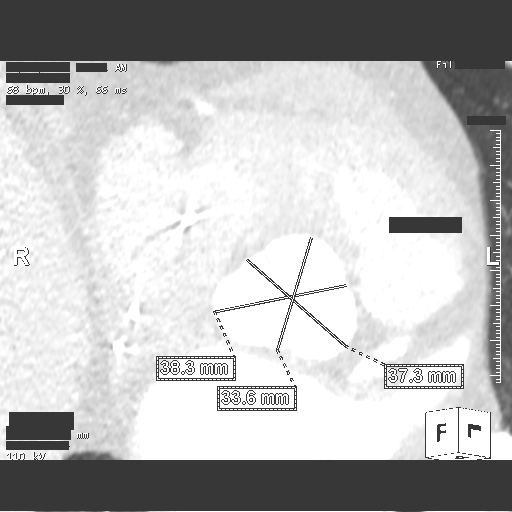
[im 11/14  vessel]
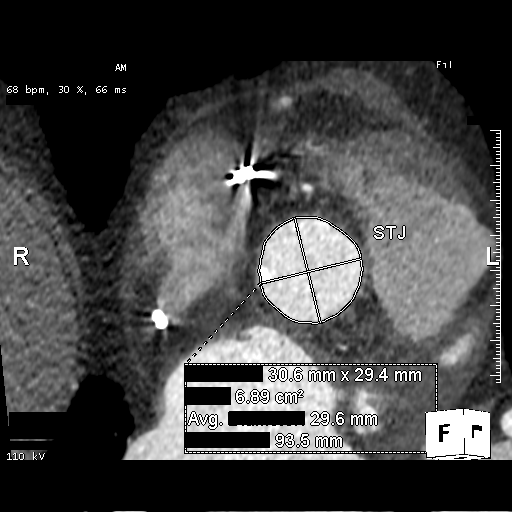
[im 12/14  vessel]
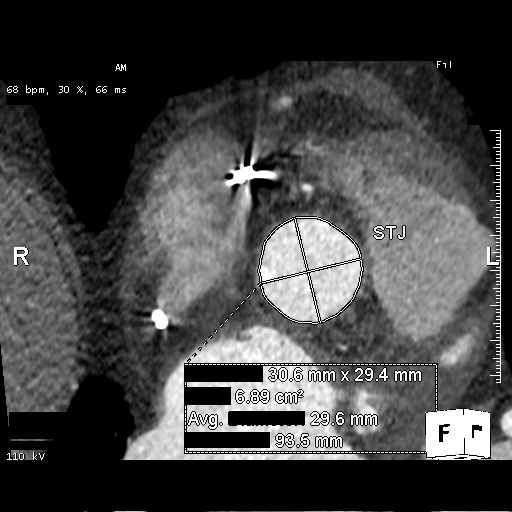
[im 13/14  vessel]
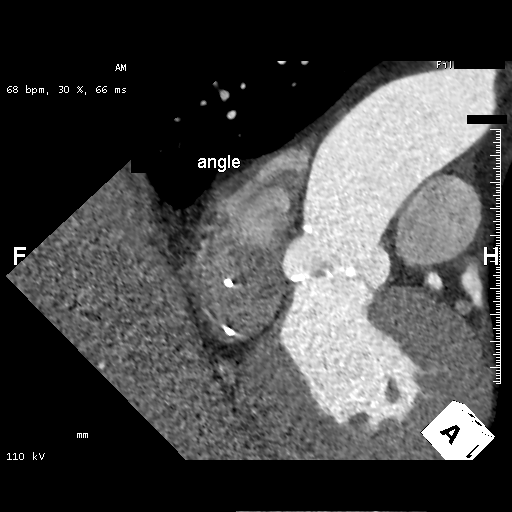

[12 of 14 positions shown; findings below may reference images not displayed]

FINDINGS: Extracardiac findings will be described separately under dictation
for contemporaneously obtained CTA chest, abdomen and pelvis.
IMPRESSION: Please see separate dictation for contemporaneously obtained CTA
chest, abdomen and pelvis dated 08/14/2021 for full description of
relevant extracardiac findings.
FINDINGS: Aortic Root:

Aortic valve: Trileaflet

Aortic valve calcium score: 6765

Aortic annulus:

Diameter: 30mm x 24mm

Perimeter: 86mm

Area: 563 mm^2

Calcifications: Mild calcification adjacent to noncoronary cusp

Coronary height: Min Left - 11mm, Max Left - 16mm; Min Right - 16mm

Sinotubular height: Left cusp - 18mm; Right cusp - 22mm; Noncoronary
cusp - 20mm

LVOT (as measured 3 mm below the annulus):

Diameter: 33mm x 23mm

Area: 560 mm^2

Calcifications: No calcifications

Aortic sinus width: Left cusp - 37mm; Right cusp - 34mm; Noncoronary
cusp - 38mm

Sinotubular junction width: 31mm x 29mm

Optimum Fluoroscopic Angle for Delivery: LAO 14 PETZ SISA 6

Cardiac:

Right atrium: Normal size.  Pacemaker lead

Right ventricle: Normal size.  Pacemaker lead

Pulmonary arteries: Normal size

Pulmonary veins: Normal configuration

Left atrium: Mild enlargement

Left ventricle: Normal size

Pericardium: Normal thickness

Coronary arteries: Calcium score 503 (70th percentile)
IMPRESSION: 1. Trileaflet aortic valve with severe calcifications (AV calcium
score 6765)

2. Aortic annulus measures 30mm x 24mm in diameter with perimeter
86mm and area 563 mm^2. Mild annular calcifications adjacent to
noncoronary cusp. No LVOT calcifications. Annular measurements
suitable for delivery of 29mm Edwards Sapien 3 valve

3. Low coronary height to left main (11mm). Sufficient coronary
height for RCA (16mm)

4. Optimum Fluoroscopic Angle for Delivery:  LAO 14 PETZ SISA 6

5. Coronary calcium score 503 (70th percentile)

*** End of Addendum ***
EXAM:
OVER-READ INTERPRETATION  CT CHEST

The following report is an over-read performed by radiologist Dr.
Gwaabe Saa [REDACTED] on 08/14/2021. This
over-read does not include interpretation of cardiac or coronary
anatomy or pathology. The coronary calcium score/coronary CTA
interpretation by the cardiologist is attached.
FINDINGS: Extracardiac findings will be described separately under dictation
for contemporaneously obtained CTA chest, abdomen and pelvis.
IMPRESSION: Please see separate dictation for contemporaneously obtained CTA
chest, abdomen and pelvis dated 08/14/2021 for full description of
relevant extracardiac findings.

## 2024-03-29 DIAGNOSIS — E039 Hypothyroidism, unspecified: Secondary | ICD-10-CM | POA: Diagnosis not present

## 2024-03-29 DIAGNOSIS — I639 Cerebral infarction, unspecified: Secondary | ICD-10-CM | POA: Diagnosis not present

## 2024-03-29 DIAGNOSIS — E119 Type 2 diabetes mellitus without complications: Secondary | ICD-10-CM | POA: Diagnosis not present

## 2024-03-29 DIAGNOSIS — F0150 Vascular dementia without behavioral disturbance: Secondary | ICD-10-CM | POA: Diagnosis not present

## 2024-04-05 IMAGING — DX DG CHEST 2V
2 series · 2 of 2 positions shown · non-contrast
Comparison: Chest radiograph 12/16/2020.

CLINICAL DATA: Preoperative evaluation.

EXAM:
CHEST - 2 VIEW

[w chest pa]
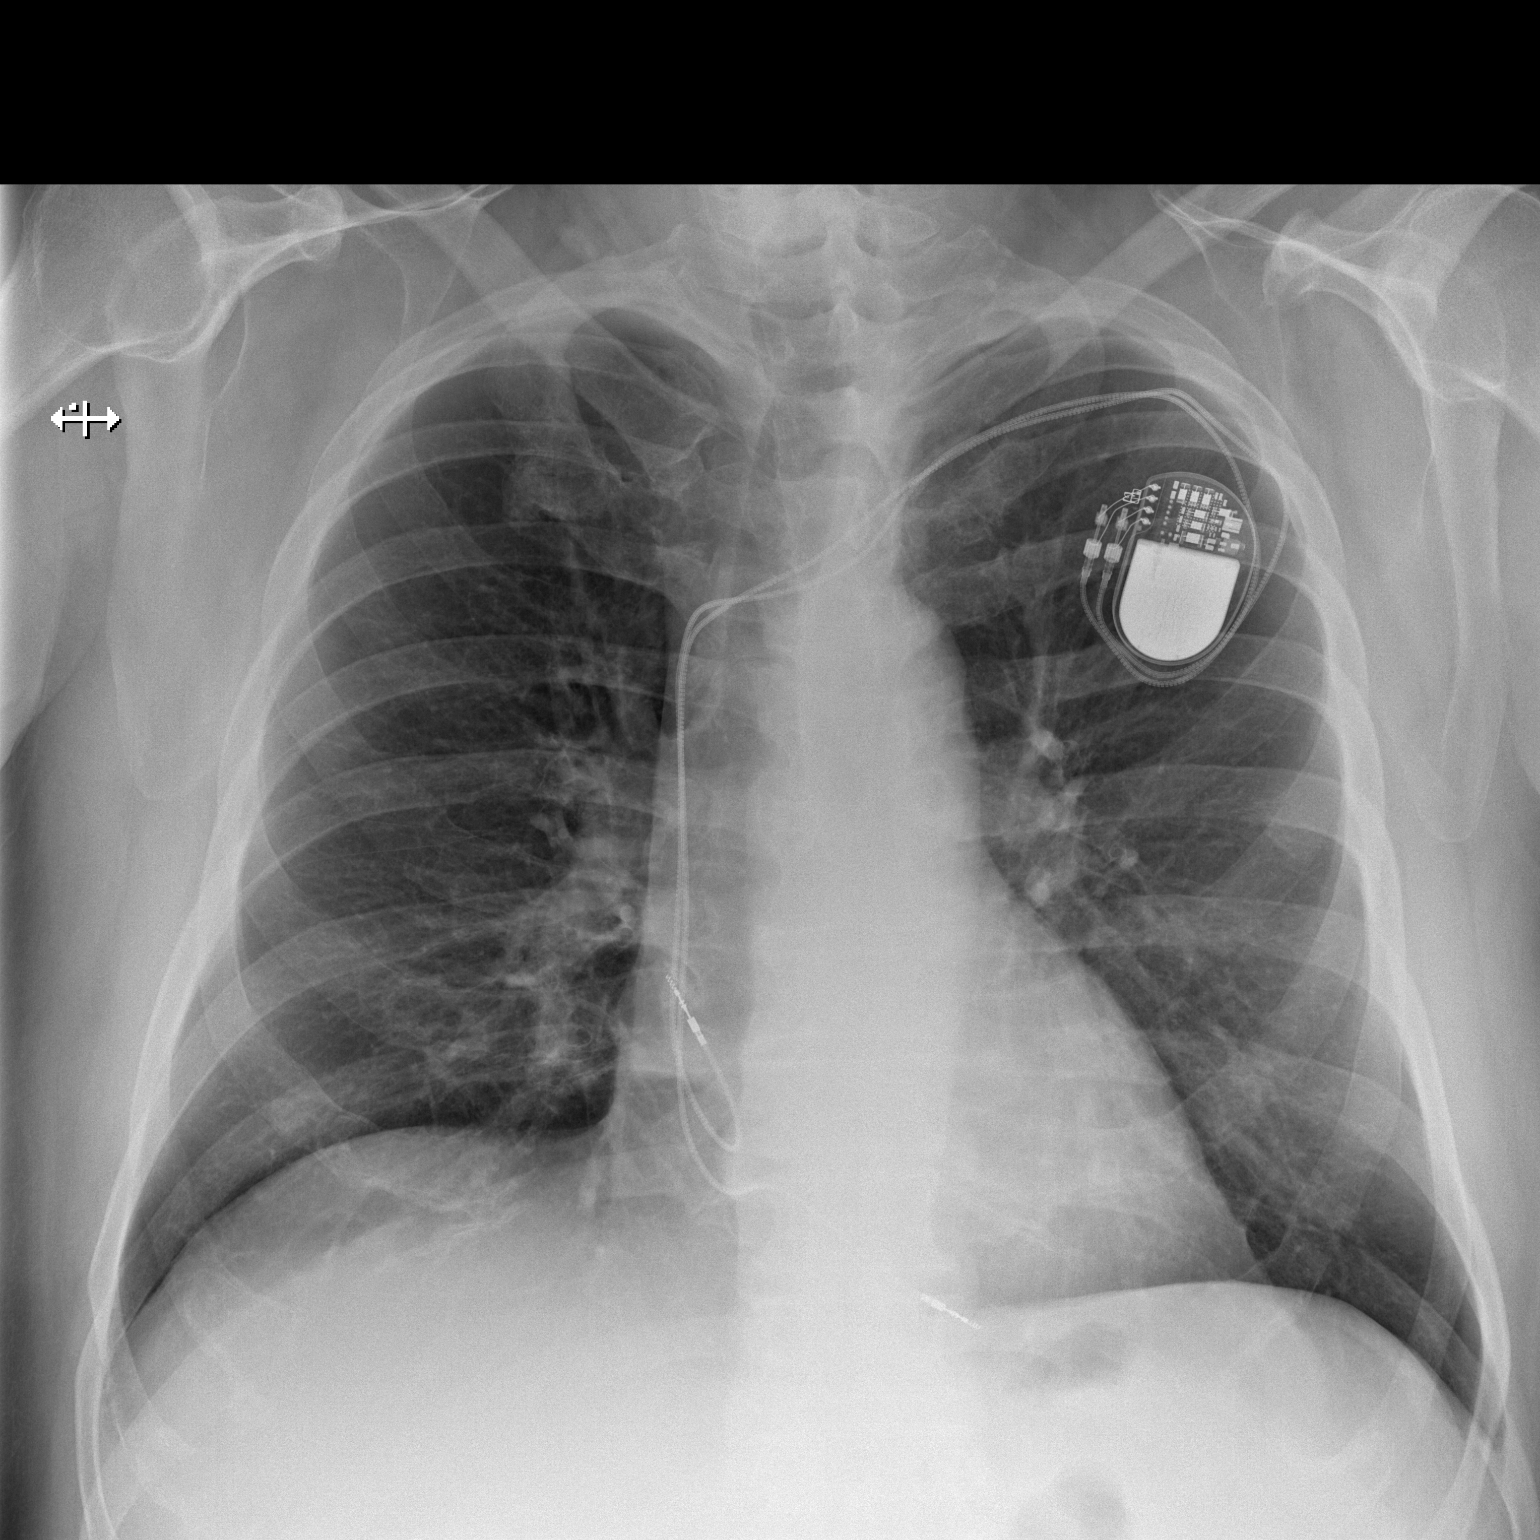

[w chest lat]
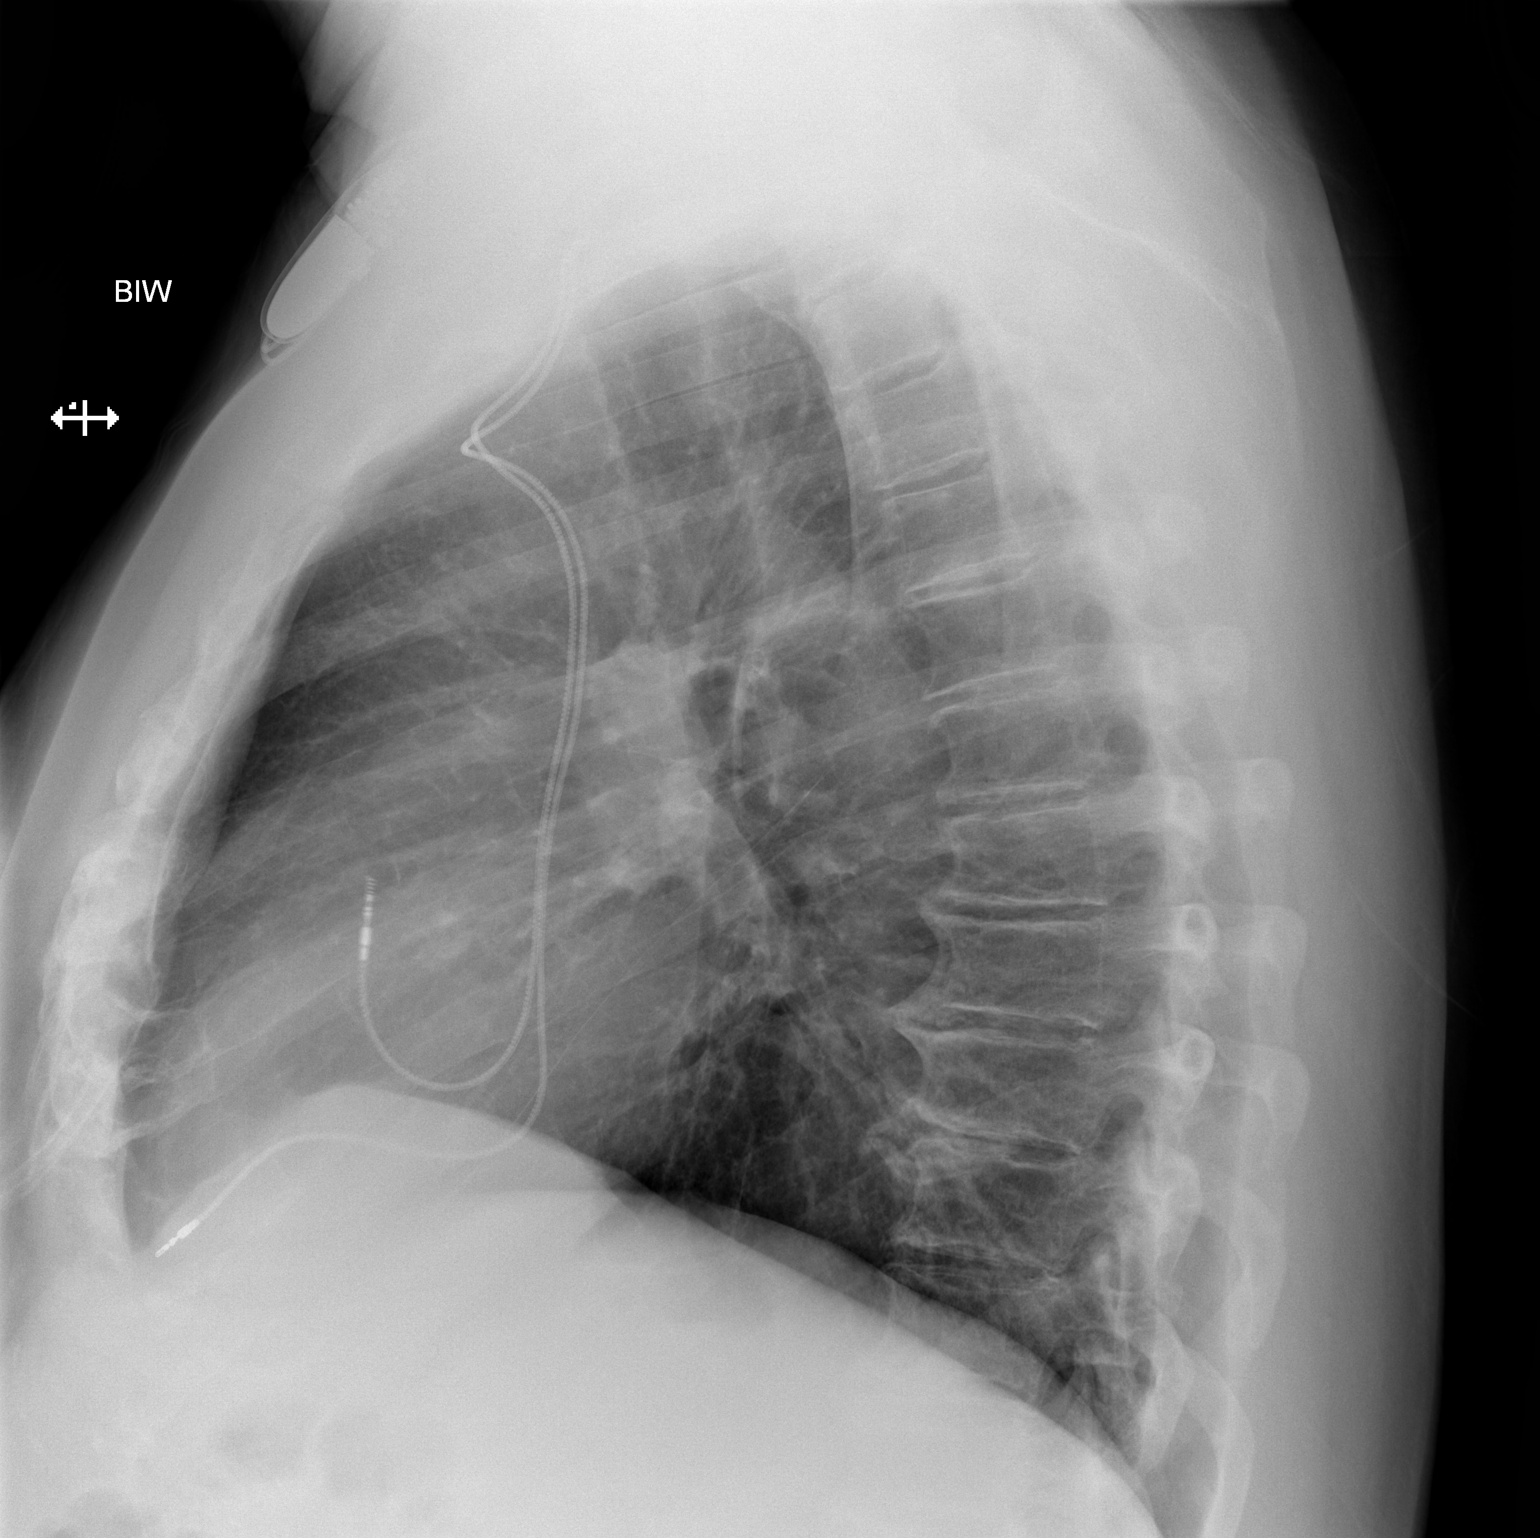

[2 of 2 positions shown; findings below may reference images not displayed]

FINDINGS: The heart size and mediastinal contours are within normal limits.
Both lungs are clear. The visualized skeletal structures are
unremarkable.
IMPRESSION: No active cardiopulmonary disease.

## 2024-04-06 ENCOUNTER — Telehealth (HOSPITAL_COMMUNITY): Payer: Self-pay

## 2024-04-06 NOTE — Telephone Encounter (Signed)
 Attempted to reach patient regarding upcoming procedure, no answer. LM to return call.   Spoke with patient's daughter Enrique Manganaro- [place], [place] on file. Inquired if patient completed lab work today or outside of [employer]. She reports that he did not. Will plan to obtain lab work on morning of procedure. Instructions reviewed:  Arrival time 0830- to allow time for lab work Nothing to eat or drink after midnight No meds AM of procedure Responsible person to drive you home and stay with him for 24 hrs Wash with special soap night before and morning of procedure Confirmed last dose of Plavix  on yesterday, 12/7.  Megan verbalized understanding to all information provided.

## 2024-04-06 NOTE — Pre-Procedure Instructions (Signed)
 Attempted to call patient regarding procedure instructions.  Left voicemail on the following items: Arrival time 0930 Nothing to eat or drink after midnight No meds AM of procedure Responsible person to drive you home and stay with you for 24 hrs Wash with special soap night before and morning of procedure If on anti-coagulant drug instructions Plavix - last dose 12/7

## 2024-04-07 ENCOUNTER — Ambulatory Visit (HOSPITAL_COMMUNITY)
Admission: RE | Admit: 2024-04-07 | Discharge: 2024-04-07 | Disposition: A | Attending: Student in an Organized Health Care Education/Training Program | Admitting: Student in an Organized Health Care Education/Training Program

## 2024-04-07 ENCOUNTER — Encounter (HOSPITAL_COMMUNITY)
Admission: RE | Disposition: A | Payer: Self-pay | Attending: Student in an Organized Health Care Education/Training Program

## 2024-04-07 ENCOUNTER — Other Ambulatory Visit: Payer: Self-pay

## 2024-04-07 ENCOUNTER — Ambulatory Visit (HOSPITAL_COMMUNITY)

## 2024-04-07 DIAGNOSIS — Z45010 Encounter for checking and testing of cardiac pacemaker pulse generator [battery]: Secondary | ICD-10-CM

## 2024-04-07 DIAGNOSIS — I428 Other cardiomyopathies: Secondary | ICD-10-CM | POA: Diagnosis not present

## 2024-04-07 DIAGNOSIS — I5020 Unspecified systolic (congestive) heart failure: Secondary | ICD-10-CM

## 2024-04-07 DIAGNOSIS — I442 Atrioventricular block, complete: Secondary | ICD-10-CM | POA: Diagnosis not present

## 2024-04-07 DIAGNOSIS — Z950 Presence of cardiac pacemaker: Secondary | ICD-10-CM | POA: Diagnosis not present

## 2024-04-07 DIAGNOSIS — I5042 Chronic combined systolic (congestive) and diastolic (congestive) heart failure: Secondary | ICD-10-CM

## 2024-04-07 HISTORY — PX: BIV UPGRADE: EP1202

## 2024-04-07 HISTORY — PX: LEAD INSERTION: EP1212

## 2024-04-07 LAB — CBC
HCT: 46.1 % (ref 39.0–52.0)
Hemoglobin: 14.9 g/dL (ref 13.0–17.0)
MCH: 29.3 pg (ref 26.0–34.0)
MCHC: 32.3 g/dL (ref 30.0–36.0)
MCV: 90.6 fL (ref 80.0–100.0)
Platelets: 186 K/uL (ref 150–400)
RBC: 5.09 MIL/uL (ref 4.22–5.81)
RDW: 13.5 % (ref 11.5–15.5)
WBC: 10.1 K/uL (ref 4.0–10.5)
nRBC: 0 % (ref 0.0–0.2)

## 2024-04-07 LAB — GLUCOSE, CAPILLARY
Glucose-Capillary: 89 mg/dL (ref 70–99)
Glucose-Capillary: 72 mg/dL (ref 70–99)
Glucose-Capillary: 92 mg/dL (ref 70–99)
Glucose-Capillary: 86 mg/dL (ref 70–99)
Glucose-Capillary: 91 mg/dL (ref 70–99)

## 2024-04-07 LAB — BASIC METABOLIC PANEL WITH GFR
Anion gap: 9 (ref 5–15)
BUN: 18 mg/dL (ref 8–23)
CO2: 28 mmol/L (ref 22–32)
Calcium: 9.1 mg/dL (ref 8.9–10.3)
Chloride: 105 mmol/L (ref 98–111)
Creatinine, Ser: 1.46 mg/dL — ABNORMAL HIGH (ref 0.61–1.24)
GFR, Estimated: 50 mL/min — ABNORMAL LOW (ref >60)
Glucose, Bld: 97 mg/dL (ref 70–99)
Potassium: 3.4 mmol/L — ABNORMAL LOW (ref 3.5–5.1)
Sodium: 142 mmol/L (ref 135–145)

## 2024-04-07 SURGERY — BIV UPGRADE

## 2024-04-07 MED ORDER — SODIUM CHLORIDE 0.9 % IV SOLN
80.0000 mg | INTRAVENOUS | Status: AC
  Administered 2024-04-07: 80 mg

## 2024-04-07 MED ORDER — ACETAMINOPHEN 325 MG PO TABS: 975.0000 mg | ORAL_TABLET | Freq: Three times a day (TID) | ORAL | Status: DC

## 2024-04-07 MED ORDER — CEFAZOLIN SODIUM-DEXTROSE 2-4 GM/100ML-% IV SOLN
2.0000 g | INTRAVENOUS | Status: AC
  Administered 2024-04-07: 2 g via INTRAVENOUS

## 2024-04-07 MED ORDER — CHLORHEXIDINE GLUCONATE 4 % EX SOLN
4.0000 | Freq: Once | CUTANEOUS | Status: DC
  Filled 2024-04-07: qty 60

## 2024-04-07 MED ORDER — SODIUM CHLORIDE 0.9 % IV SOLN: INTRAVENOUS | Status: DC

## 2024-04-07 MED ORDER — SODIUM CHLORIDE 0.9% FLUSH: 3.0000 mL | INTRAVENOUS | Status: DC | PRN

## 2024-04-07 MED ORDER — LIDOCAINE HCL (PF) 1 % IJ SOLN
INTRAMUSCULAR | Status: AC
  Filled 2024-04-07: qty 60

## 2024-04-07 MED ORDER — SODIUM CHLORIDE 0.9 % IV SOLN
INTRAVENOUS | Status: DC
  Filled 2024-04-07: qty 2

## 2024-04-07 MED ORDER — POVIDONE-IODINE 10 % EX SWAB
2.0000 | Freq: Once | CUTANEOUS | Status: AC
  Administered 2024-04-07: 2 via TOPICAL

## 2024-04-07 MED ORDER — IOHEXOL 350 MG/ML SOLN
INTRAVENOUS | Status: DC | PRN
  Administered 2024-04-07: 40 mL

## 2024-04-07 MED ORDER — LIDOCAINE HCL (PF) 1 % IJ SOLN
INTRAMUSCULAR | Status: DC | PRN
  Administered 2024-04-07: 45 mL

## 2024-04-07 MED ORDER — CEFAZOLIN SODIUM-DEXTROSE 2-4 GM/100ML-% IV SOLN
INTRAVENOUS | Status: DC
  Filled 2024-04-07: qty 100

## 2024-04-07 MED ORDER — HYDRALAZINE HCL 20 MG/ML IJ SOLN
5.0000 mg | Freq: Once | INTRAMUSCULAR | Status: AC
  Administered 2024-04-07: 5 mg via INTRAVENOUS
  Filled 2024-04-07: qty 1

## 2024-04-07 MED ORDER — DEXTROSE 50 % IV SOLN
25.0000 mL | Freq: Once | INTRAVENOUS | Status: AC
  Administered 2024-04-07: 25 mL via INTRAVENOUS

## 2024-04-07 MED ORDER — HEPARIN (PORCINE) IN NACL 1000-0.9 UT/500ML-% IV SOLN
INTRAVENOUS | Status: DC | PRN
  Administered 2024-04-07: 500 mL

## 2024-04-07 MED ORDER — DEXTROSE 50 % IV SOLN
INTRAVENOUS | Status: DC
  Filled 2024-04-07: qty 50

## 2024-04-07 MED ORDER — CARVEDILOL 12.5 MG PO TABS
12.5000 mg | ORAL_TABLET | Freq: Two times a day (BID) | ORAL | Status: DC
  Administered 2024-04-07: 12.5 mg via ORAL
  Filled 2024-04-07: qty 1

## 2024-04-07 MED ORDER — SODIUM CHLORIDE 0.9% FLUSH: 3.0000 mL | Freq: Two times a day (BID) | INTRAVENOUS | Status: DC

## 2024-04-07 SURGICAL SUPPLY — 14 items
BALLOON ATTAIN 80 (BALLOONS) IMPLANT
CABLE SURGICAL S-101-97-12 (CABLE) ×1 IMPLANT
DEVICE CRTP PERCEPTA QUAD MRI (Pacemaker) IMPLANT
INTRO WORLEY LAT VEIN 5.5 (INTRODUCER) IMPLANT
INTRO WORLEY SINUS STD 40 (INTRODUCER) IMPLANT
LEAD ATTAIN PERFORMA S 4598-88 (Lead) IMPLANT
PAD DEFIB RADIO PHYSIO CONN (PAD) ×1 IMPLANT
POUCH AIGIS-R ANTIBACT PPM MED (Mesh General) IMPLANT
SHEATH 9.5FR PRELUDE SNAP 13 (SHEATH) IMPLANT
SHEATH PROBE COVER 6X72 (BAG) IMPLANT
TRAY PACEMAKER INSERTION (PACKS) ×1 IMPLANT
TUBING CIL FLEX 10 FLL-RA (TUBING) IMPLANT
WIRE ACUITY WHISPER EDS 4648 (WIRE) IMPLANT
WIRE HI TORQ VERSACORE-J 145CM (WIRE) IMPLANT

## 2024-04-07 NOTE — Interval H&P Note (Signed)
 History and Physical Interval Note:  04/07/2024 12:25 PM  Tony [name]  has presented today for surgery, with the diagnosis of heart block - heart failure.  The various methods of treatment have been discussed with the patient and family. After consideration of risks, benefits and other options for treatment, the patient has consented to  Procedure(s): BIV UPGRADE (N/A) LEAD INSERTION (N/A) as a surgical intervention.  The patient's history has been reviewed, patient examined, no change in status, stable for surgery.  I have reviewed the patient's chart and labs.  Questions were answered to the patient's satisfaction.     Tony [name]

## 2024-04-07 NOTE — Progress Notes (Signed)
 Patient and patient daughter given discharge instructions, education provided no further questions at this time. Patient able to ambulate and void before discharge. Able to tolerate PO intake. X-ray preformed and reviewed by MD Al[place], seen by MD and device rep. Elevated BP noted upon arrival called MD and made aware please see MAR for interventions. Wound check made, paperwork printed with update appointment listed, verified when to restart patient blood thinner.  Patient site is clean, dry, intact with no hematoma noted upon discharge.

## 2024-04-07 NOTE — Op Note (Signed)
 Procedure:  LEFT sided MDT DC PPM->CRTP upgrade (add LV lead and generator change)    Attending: Adina Primus, MD   Indication for CRT: chronic RV pacing >40%, NICM/HFrEF, LVEF < 50%   Complications: none apparent at time of procedure   EBL: minimal   Anesthesia: monitored anesthesia care  Procedure Description: The left shoulder was prepped with chlorhexidine  scrub. 50 cc lidocaine  was injected at the planned incision site. A pocket was created with electrocautery and blunt dissection.    A venogram was performed which demonstrated a patent axillary vein. The left axillary vein was accessed using a standard access needle under ultrasound guidance. A wire was passed into the IVC.  A standard 0.035 guidewire was advanced to the RA. The 9 Fr Worley Standard CS Guide Catheter was then advanced over the wire while connected to a contrast injection system. The dilator was removed and the CS guide was advanced into coronary sinus using fluoroscopic guidance. The CS balloon catheter was then advanced into the lumen of the CS guide and a coronary sinus venogram was obtained.  This demonstrated an initial target of a mid lateral vein. There was only this single CS branch on occlusive CS venogram. The Worley Advanced 5.5 Fr Renal lateral vein introducer (LVI) and Standard vein selector and 0.014 [employer] wire were used to cannulate the target branch. The vein introducer was advanced over the wire into the target vein with support of the vein selector. The MDT Virl Cover [id number] was then delivered over the wire. Interrogation revealed adequate LV electrode parameters without phrenic capture (LV unipolar 3 and 4, LV unipolar 1 and 2 had phrenic above 3 V) at 10 V.   The [facility] Advanced 5.5 Fr Renal lateral vein introducer (LVI) and Standard vein selector was split and removed. The 9 Fr Worley Standard CS Guide Catheter was split and removed. The 0.014 [employer] wire was removed. The lead was affixed to the underlying fascia with two 0-silk sutures.   The device was then connected to the leads. The pocket was irrigated with gentamicin  solution. A TYRX pouch was used. The pocket was closed with continuous 2-0 V-Loc, 3-0 Stratafix and Dermabond.   The device was evaluated by the representative of the cardiac device manufacturer under the supervision of the attending physician with implant parameters listed below.   Implanted Hardware: Implant Name Type Inv. Item Serial No. Manufacturer Lot No. LRB No. Used Action  LEAD ATTAIN PERFORMA S 4598-88 - [id number] V Lead LEAD ATTAIN PERFORMA S [id number] V [employer]  N/A 1 Implanted  POUCH AIGIS-R ANTIBACT PPM MED - [id number] Mesh General POUCH AIGIS-R ANTIBACT PPM MED  [employer] [id number] N/A 1 Implanted  DEVICE CRTP PERCEPTA QUAD MRI - [id number] S Pacemaker DEVICE CRTP PERCEPTA QUAD MRI [id number] S [employer]  N/A 1 Implanted   Device Information: CRT: MDT Andrew CROME, SN [id number], DOI 04/07/24 RA: MDT 4923, SN [id number], DOI 08/11/14 RV: MDT 5076, SN [id number], DOI 08/11/14 LV: MDT 5401-11, SN [id number], DOI 04/07/24  Lead Interrogation Data: RA Sensing Amplitude: 2.6 mV  RA Pace/Sense Impedance: 361 ohms  RA Capture Threshold: 0.75 V @ 0.4 ms  RV Sensing Amplitude: 7.4 mV  RV Pace/Sense Impedance: 456 ohms  RV Capture Threshold: 0.75 V @ 0.4 ms  LV Pace/Sense Impedance: 361 ohms LV Capture Threshold: 0.5 V @ 0.4 ms (LV4-unipolar)   Summary: Successful MDT DC PPM->CRTP upgrade (add LV lead, generator replacement)   Recommendations:  Routine post-procedure care with bedrest for 1 hour No heparin  (IV or subcutaneous) for 48 hours. No enoxaparin  (IV or subcutaneous) for 7 days.  Resume OP plavix  75 mg daily with prior CVA hx  PA/lateral CXR prior to discharge   Wound check prior to discharge Device interrogation post  CXR Same day discharge   Tony DELENA Primus, MD [facility]  Cardiac Electrophysiology

## 2024-04-07 NOTE — Discharge Instructions (Addendum)
 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   If you have an [employer] or [employer] device, plug your home monitor once you get home, sit near the device, and press the large activation button. Sit nearby until the process is complete, usually notated by lights on the monitor.   If you were set up for monitoring using an app on your phone, make sure the app remains open in the background and the Bluetooth remains on.  ACTIVITY Do not lift your arm above shoulder height for 1 week after your procedure. After 7 days, you may progress as below.  You should remove your sling 24 hours after your procedure, unless otherwise instructed by your provider.     Tuesday April 14, 2024  Wednesday April 15, 2024 Thursday April 16, 2024 Friday April 17, 2024   Do not lift, push, pull, or carry anything over 10 pounds with the affected arm until 6 weeks (Tuesday May 19, 2024 ) after your procedure.   You may drive AFTER your wound check, unless you have been told otherwise by your provider.   Ask your healthcare provider when you can go back to work   INCISION/Dressing If you are on a blood thinner such as Coumadin, Xarelto, Eliquis, Plavix , or Pradaxa please confirm with your provider when this should be resumed.   Monitor your site for redness, swelling, and drainage. Call the device clinic at [phone] if you experience these symptoms or fever/chills.    If your incision is sealed with Dermabond (glue), you may shower 24 hrs after your procedure or when told by your provider. Do not remove the glue or let the shower hit directly on your site. You may wash around your site with soap and water .    If you were discharged in a sling, please do not wear this during the day more than 48 hours after your surgery unless otherwise instructed. This may increase the risk  of stiffness and soreness in your shoulder.   Avoid lotions, ointments, or perfumes over your incision until it is well-healed.  You may use a hot tub or a pool AFTER your wound check appointment if the incision is completely closed  If your device is capable of reading fluid status (for heart failure), you will be offered monthly monitoring to review this with you.   DEVICE MANAGEMENT Remote monitoring is used to monitor your ICD from home. This monitoring is scheduled every 91 days by our office. It allows us  to keep an eye on the functioning of your device to ensure it is working properly. You will routinely see your Electrophysiologist annually (more often if necessary). This will appear as a REMOTE check on your MyChart schedule. These are automatic and there is nothing for you to manually do unless otherwise instructed.  You should receive your ID card for your new device in 4-8 weeks. Keep this card with you at all times once received. Consider wearing a medical alert bracelet or necklace.  Your device  may be MRI compatible. This will be discussed at your next office visit/wound check.  You should avoid contact with strong electric or magnetic fields.   Do not use amateur (ham) radio equipment or electric (arc) welding torches. MP3 player headphones with magnets should not be used. Some devices are safe to use if held at least 12 inches (  30 cm) from your defibrillator. These include power tools, lawn mowers, and speakers. If you are unsure if something is safe to use, ask your health care provider.  When using your cell phone, hold it to the ear that is on the opposite side from the defibrillator. Do not leave your cell phone in a pocket over the defibrillator.  You may safely use electric blankets, heating pads, computers, and microwave ovens.  Call the office right away if: You have chest pain. You feel more than one shock. You feel more short of breath than you have felt  before. You feel more light-headed than you have felt before. Your incision starts to open up.  This information is not intended to replace advice given to you by your health care provider. Make sure you discuss any questions you have with your health care provider.

## 2024-04-08 ENCOUNTER — Encounter (HOSPITAL_COMMUNITY): Payer: Self-pay | Admitting: Student in an Organized Health Care Education/Training Program

## 2024-04-21 ENCOUNTER — Ambulatory Visit

## 2024-04-21 ENCOUNTER — Other Ambulatory Visit (HOSPITAL_BASED_OUTPATIENT_CLINIC_OR_DEPARTMENT_OTHER): Payer: Self-pay

## 2024-04-21 DIAGNOSIS — I442 Atrioventricular block, complete: Secondary | ICD-10-CM | POA: Diagnosis not present

## 2024-04-21 DIAGNOSIS — I5042 Chronic combined systolic (congestive) and diastolic (congestive) heart failure: Secondary | ICD-10-CM

## 2024-04-21 LAB — CUP PACEART INCLINIC DEVICE CHECK
Date Time Interrogation Session: 20251223095519
Implantable Lead Connection Status: 753985
Implantable Lead Connection Status: 753985
Implantable Lead Connection Status: 753985
Implantable Lead Implant Date: 20160413
Implantable Lead Implant Date: 20160413
Implantable Lead Implant Date: 20251209
Implantable Lead Location: 753858
Implantable Lead Location: 753859
Implantable Lead Location: 753860
Implantable Lead Model: 4598
Implantable Lead Model: 5076
Implantable Lead Model: 5076
Implantable Pulse Generator Implant Date: 20251209

## 2024-04-21 NOTE — Patient Instructions (Signed)
" ° °  After Your Pacemaker   Monitor your pacemaker site for redness, swelling, and drainage. Call the device clinic at [phone] if you experience these symptoms or fever/chills.  Your incision was closed with Dermabond:  You may shower 1 day after your defibrillator implant and wash your incision with soap and water . Avoid lotions, ointments, or perfumes over your incision until it is well-healed.  You may use a hot tub or a pool after your wound check appointment if the incision is completely closed.  Do not lift, push or pull greater than 10 pounds with the affected arm until JANUARY 20th. There are no other restrictions in arm movement after your wound check appointment.  You may drive, unless driving has been restricted by your healthcare providers.  Remote monitoring is used to monitor your pacemaker from home. This monitoring is scheduled every 91 days by our office. It allows us  to keep an eye on the functioning of your device to ensure it is working properly. You will routinely see your Electrophysiologist annually (more often if necessary).  "

## 2024-04-21 NOTE — Progress Notes (Signed)
 Normal multi chamber pacemaker wound check. Presenting rhythm: AS/BV 85. Wound well healed. Routine testing performed. Thresholds, sensing, and impedance consistent with implant measurements. No episodes. Reviewed arm restrictions to continue for 6 weeks total post op. Pt enrolled in remote follow-up.

## 2024-04-24 ENCOUNTER — Ambulatory Visit: Payer: Self-pay | Admitting: Student in an Organized Health Care Education/Training Program

## 2024-05-19 ENCOUNTER — Ambulatory Visit

## 2024-05-19 DIAGNOSIS — I442 Atrioventricular block, complete: Secondary | ICD-10-CM

## 2024-05-20 LAB — CUP PACEART REMOTE DEVICE CHECK
Battery Remaining Longevity: 131 mo
Battery Voltage: 3.2 V
Brady Statistic AP VP Percent: 9.07 %
Brady Statistic AP VS Percent: 0.01 %
Brady Statistic AS VP Percent: 90.77 %
Brady Statistic AS VS Percent: 0.16 %
Brady Statistic RA Percent Paced: 9.03 %
Brady Statistic RV Percent Paced: 99.83 %
Date Time Interrogation Session: 20260120024730
Implantable Lead Connection Status: 753985
Implantable Lead Connection Status: 753985
Implantable Lead Connection Status: 753985
Implantable Lead Implant Date: 20160413
Implantable Lead Implant Date: 20160413
Implantable Lead Implant Date: 20251209
Implantable Lead Location: 753858
Implantable Lead Location: 753859
Implantable Lead Location: 753860
Implantable Lead Model: 4598
Implantable Lead Model: 5076
Implantable Lead Model: 5076
Implantable Pulse Generator Implant Date: 20251209
Lead Channel Impedance Value: 285 Ohm
Lead Channel Impedance Value: 380 Ohm
Lead Channel Impedance Value: 380 Ohm
Lead Channel Impedance Value: 380 Ohm
Lead Channel Impedance Value: 418 Ohm
Lead Channel Impedance Value: 418 Ohm
Lead Channel Impedance Value: 437 Ohm
Lead Channel Impedance Value: 475 Ohm
Lead Channel Impedance Value: 513 Ohm
Lead Channel Impedance Value: 646 Ohm
Lead Channel Impedance Value: 684 Ohm
Lead Channel Impedance Value: 684 Ohm
Lead Channel Impedance Value: 703 Ohm
Lead Channel Impedance Value: 703 Ohm
Lead Channel Pacing Threshold Amplitude: 0.5 V
Lead Channel Pacing Threshold Amplitude: 0.625 V
Lead Channel Pacing Threshold Amplitude: 0.875 V
Lead Channel Pacing Threshold Pulse Width: 0.4 ms
Lead Channel Pacing Threshold Pulse Width: 0.4 ms
Lead Channel Pacing Threshold Pulse Width: 0.4 ms
Lead Channel Sensing Intrinsic Amplitude: 1.75 mV
Lead Channel Sensing Intrinsic Amplitude: 1.75 mV
Lead Channel Sensing Intrinsic Amplitude: 5.25 mV
Lead Channel Sensing Intrinsic Amplitude: 5.25 mV
Lead Channel Setting Pacing Amplitude: 1.5 V
Lead Channel Setting Pacing Amplitude: 1.75 V
Lead Channel Setting Pacing Amplitude: 2 V
Lead Channel Setting Pacing Pulse Width: 0.4 ms
Lead Channel Setting Pacing Pulse Width: 0.4 ms
Lead Channel Setting Sensing Sensitivity: 0.9 mV
Zone Setting Status: 755011
Zone Setting Status: 755011

## 2024-05-22 NOTE — Progress Notes (Signed)
 Remote PPM Transmission

## 2024-05-31 ENCOUNTER — Ambulatory Visit: Payer: Self-pay | Admitting: Student in an Organized Health Care Education/Training Program

## 2024-07-07 ENCOUNTER — Ambulatory Visit: Admitting: Student

## 2024-08-18 ENCOUNTER — Ambulatory Visit

## 2024-11-17 ENCOUNTER — Ambulatory Visit

## 2025-02-16 ENCOUNTER — Ambulatory Visit

## 2025-05-18 ENCOUNTER — Ambulatory Visit
